# Patient Record
Sex: Female | Born: 1935 | Race: White | Hispanic: No | Smoking: Never smoker
Health system: Southern US, Community
[De-identification: ages and names within clinical notes are randomized; demographics above are authoritative.]

## PROBLEM LIST (undated history)

## (undated) DIAGNOSIS — E119 Type 2 diabetes mellitus without complications: Secondary | ICD-10-CM

## (undated) DIAGNOSIS — I10 Essential (primary) hypertension: Secondary | ICD-10-CM

## (undated) DIAGNOSIS — C50919 Malignant neoplasm of unspecified site of unspecified female breast: Secondary | ICD-10-CM

## (undated) DIAGNOSIS — D649 Anemia, unspecified: Secondary | ICD-10-CM

## (undated) DIAGNOSIS — J449 Chronic obstructive pulmonary disease, unspecified: Secondary | ICD-10-CM

## (undated) DIAGNOSIS — G912 (Idiopathic) normal pressure hydrocephalus: Secondary | ICD-10-CM

## (undated) DIAGNOSIS — M810 Age-related osteoporosis without current pathological fracture: Secondary | ICD-10-CM

## (undated) HISTORY — PX: CHOLECYSTECTOMY: SHX55

## (undated) HISTORY — PX: TUBAL LIGATION: SHX77

---

## 1992-04-14 DIAGNOSIS — C50919 Malignant neoplasm of unspecified site of unspecified female breast: Secondary | ICD-10-CM

## 1992-04-14 HISTORY — DX: Malignant neoplasm of unspecified site of unspecified female breast: C50.919

## 1992-04-14 HISTORY — PX: MASTECTOMY: SHX3

## 2011-04-17 DIAGNOSIS — Z853 Personal history of malignant neoplasm of breast: Secondary | ICD-10-CM | POA: Diagnosis not present

## 2011-04-17 DIAGNOSIS — C50919 Malignant neoplasm of unspecified site of unspecified female breast: Secondary | ICD-10-CM | POA: Diagnosis not present

## 2011-04-22 DIAGNOSIS — Z170 Estrogen receptor positive status [ER+]: Secondary | ICD-10-CM | POA: Diagnosis not present

## 2011-04-22 DIAGNOSIS — C50919 Malignant neoplasm of unspecified site of unspecified female breast: Secondary | ICD-10-CM | POA: Diagnosis not present

## 2011-06-09 DIAGNOSIS — R0989 Other specified symptoms and signs involving the circulatory and respiratory systems: Secondary | ICD-10-CM | POA: Diagnosis not present

## 2011-06-09 DIAGNOSIS — R0609 Other forms of dyspnea: Secondary | ICD-10-CM | POA: Diagnosis not present

## 2011-06-09 DIAGNOSIS — J4489 Other specified chronic obstructive pulmonary disease: Secondary | ICD-10-CM | POA: Diagnosis not present

## 2011-06-09 DIAGNOSIS — J209 Acute bronchitis, unspecified: Secondary | ICD-10-CM | POA: Diagnosis not present

## 2011-06-09 DIAGNOSIS — J449 Chronic obstructive pulmonary disease, unspecified: Secondary | ICD-10-CM | POA: Diagnosis not present

## 2011-06-11 DIAGNOSIS — J209 Acute bronchitis, unspecified: Secondary | ICD-10-CM | POA: Diagnosis not present

## 2011-06-11 DIAGNOSIS — J449 Chronic obstructive pulmonary disease, unspecified: Secondary | ICD-10-CM | POA: Diagnosis not present

## 2011-06-11 DIAGNOSIS — E119 Type 2 diabetes mellitus without complications: Secondary | ICD-10-CM | POA: Diagnosis not present

## 2011-06-19 DIAGNOSIS — R0609 Other forms of dyspnea: Secondary | ICD-10-CM | POA: Diagnosis not present

## 2011-06-19 DIAGNOSIS — J209 Acute bronchitis, unspecified: Secondary | ICD-10-CM | POA: Diagnosis not present

## 2011-06-19 DIAGNOSIS — E119 Type 2 diabetes mellitus without complications: Secondary | ICD-10-CM | POA: Diagnosis not present

## 2011-06-19 DIAGNOSIS — J449 Chronic obstructive pulmonary disease, unspecified: Secondary | ICD-10-CM | POA: Diagnosis not present

## 2011-06-23 DIAGNOSIS — M1990 Unspecified osteoarthritis, unspecified site: Secondary | ICD-10-CM | POA: Diagnosis not present

## 2011-06-23 DIAGNOSIS — M25559 Pain in unspecified hip: Secondary | ICD-10-CM | POA: Diagnosis not present

## 2011-06-23 DIAGNOSIS — J209 Acute bronchitis, unspecified: Secondary | ICD-10-CM | POA: Diagnosis not present

## 2011-08-12 DIAGNOSIS — J209 Acute bronchitis, unspecified: Secondary | ICD-10-CM | POA: Diagnosis not present

## 2011-08-12 DIAGNOSIS — Z853 Personal history of malignant neoplasm of breast: Secondary | ICD-10-CM | POA: Diagnosis not present

## 2011-08-12 DIAGNOSIS — J449 Chronic obstructive pulmonary disease, unspecified: Secondary | ICD-10-CM | POA: Diagnosis not present

## 2011-08-12 DIAGNOSIS — I8310 Varicose veins of unspecified lower extremity with inflammation: Secondary | ICD-10-CM | POA: Diagnosis not present

## 2011-08-27 DIAGNOSIS — D485 Neoplasm of uncertain behavior of skin: Secondary | ICD-10-CM | POA: Diagnosis not present

## 2011-08-27 DIAGNOSIS — L820 Inflamed seborrheic keratosis: Secondary | ICD-10-CM | POA: Diagnosis not present

## 2011-08-27 DIAGNOSIS — L570 Actinic keratosis: Secondary | ICD-10-CM | POA: Diagnosis not present

## 2011-08-27 DIAGNOSIS — Q828 Other specified congenital malformations of skin: Secondary | ICD-10-CM | POA: Diagnosis not present

## 2011-10-14 DIAGNOSIS — Z853 Personal history of malignant neoplasm of breast: Secondary | ICD-10-CM | POA: Diagnosis not present

## 2011-10-14 DIAGNOSIS — M810 Age-related osteoporosis without current pathological fracture: Secondary | ICD-10-CM | POA: Diagnosis not present

## 2011-10-14 DIAGNOSIS — I10 Essential (primary) hypertension: Secondary | ICD-10-CM | POA: Diagnosis not present

## 2011-10-14 DIAGNOSIS — J449 Chronic obstructive pulmonary disease, unspecified: Secondary | ICD-10-CM | POA: Diagnosis not present

## 2011-10-23 DIAGNOSIS — Z853 Personal history of malignant neoplasm of breast: Secondary | ICD-10-CM | POA: Diagnosis not present

## 2011-11-26 DIAGNOSIS — Z853 Personal history of malignant neoplasm of breast: Secondary | ICD-10-CM | POA: Diagnosis not present

## 2011-11-26 DIAGNOSIS — J449 Chronic obstructive pulmonary disease, unspecified: Secondary | ICD-10-CM | POA: Diagnosis not present

## 2011-12-03 DIAGNOSIS — Z853 Personal history of malignant neoplasm of breast: Secondary | ICD-10-CM | POA: Diagnosis not present

## 2011-12-03 DIAGNOSIS — N63 Unspecified lump in unspecified breast: Secondary | ICD-10-CM | POA: Diagnosis not present

## 2011-12-29 DIAGNOSIS — Z170 Estrogen receptor positive status [ER+]: Secondary | ICD-10-CM | POA: Diagnosis not present

## 2011-12-29 DIAGNOSIS — C50919 Malignant neoplasm of unspecified site of unspecified female breast: Secondary | ICD-10-CM | POA: Diagnosis not present

## 2012-01-05 DIAGNOSIS — Z170 Estrogen receptor positive status [ER+]: Secondary | ICD-10-CM | POA: Diagnosis not present

## 2012-01-05 DIAGNOSIS — C50119 Malignant neoplasm of central portion of unspecified female breast: Secondary | ICD-10-CM | POA: Diagnosis not present

## 2012-01-27 DIAGNOSIS — J45909 Unspecified asthma, uncomplicated: Secondary | ICD-10-CM | POA: Diagnosis not present

## 2012-01-27 DIAGNOSIS — J328 Other chronic sinusitis: Secondary | ICD-10-CM | POA: Diagnosis not present

## 2012-01-27 DIAGNOSIS — J984 Other disorders of lung: Secondary | ICD-10-CM | POA: Diagnosis not present

## 2012-01-27 DIAGNOSIS — R05 Cough: Secondary | ICD-10-CM | POA: Diagnosis not present

## 2012-01-27 DIAGNOSIS — R0602 Shortness of breath: Secondary | ICD-10-CM | POA: Diagnosis not present

## 2012-02-03 DIAGNOSIS — Z23 Encounter for immunization: Secondary | ICD-10-CM | POA: Diagnosis not present

## 2012-02-17 DIAGNOSIS — J449 Chronic obstructive pulmonary disease, unspecified: Secondary | ICD-10-CM | POA: Diagnosis not present

## 2012-02-17 DIAGNOSIS — M159 Polyosteoarthritis, unspecified: Secondary | ICD-10-CM | POA: Diagnosis not present

## 2012-02-17 DIAGNOSIS — E119 Type 2 diabetes mellitus without complications: Secondary | ICD-10-CM | POA: Diagnosis not present

## 2012-02-17 DIAGNOSIS — Z0000 Encounter for general adult medical examination without abnormal findings: Secondary | ICD-10-CM | POA: Diagnosis not present

## 2012-02-17 DIAGNOSIS — Z853 Personal history of malignant neoplasm of breast: Secondary | ICD-10-CM | POA: Diagnosis not present

## 2012-02-17 DIAGNOSIS — I10 Essential (primary) hypertension: Secondary | ICD-10-CM | POA: Diagnosis not present

## 2012-02-23 DIAGNOSIS — Z853 Personal history of malignant neoplasm of breast: Secondary | ICD-10-CM | POA: Diagnosis not present

## 2012-02-23 DIAGNOSIS — M810 Age-related osteoporosis without current pathological fracture: Secondary | ICD-10-CM | POA: Diagnosis not present

## 2012-02-23 DIAGNOSIS — J449 Chronic obstructive pulmonary disease, unspecified: Secondary | ICD-10-CM | POA: Diagnosis not present

## 2012-02-23 DIAGNOSIS — E119 Type 2 diabetes mellitus without complications: Secondary | ICD-10-CM | POA: Diagnosis not present

## 2012-02-25 DIAGNOSIS — E119 Type 2 diabetes mellitus without complications: Secondary | ICD-10-CM | POA: Diagnosis not present

## 2012-02-25 DIAGNOSIS — Z961 Presence of intraocular lens: Secondary | ICD-10-CM | POA: Diagnosis not present

## 2012-05-31 DIAGNOSIS — J328 Other chronic sinusitis: Secondary | ICD-10-CM | POA: Diagnosis not present

## 2012-05-31 DIAGNOSIS — J45909 Unspecified asthma, uncomplicated: Secondary | ICD-10-CM | POA: Diagnosis not present

## 2012-05-31 DIAGNOSIS — R05 Cough: Secondary | ICD-10-CM | POA: Diagnosis not present

## 2012-06-23 DIAGNOSIS — Z853 Personal history of malignant neoplasm of breast: Secondary | ICD-10-CM | POA: Diagnosis not present

## 2012-06-23 DIAGNOSIS — C50919 Malignant neoplasm of unspecified site of unspecified female breast: Secondary | ICD-10-CM | POA: Diagnosis not present

## 2012-06-23 DIAGNOSIS — R51 Headache: Secondary | ICD-10-CM | POA: Diagnosis not present

## 2012-06-23 DIAGNOSIS — J449 Chronic obstructive pulmonary disease, unspecified: Secondary | ICD-10-CM | POA: Diagnosis not present

## 2012-06-23 DIAGNOSIS — E119 Type 2 diabetes mellitus without complications: Secondary | ICD-10-CM | POA: Diagnosis not present

## 2012-06-29 DIAGNOSIS — R51 Headache: Secondary | ICD-10-CM | POA: Diagnosis not present

## 2012-07-01 DIAGNOSIS — Z853 Personal history of malignant neoplasm of breast: Secondary | ICD-10-CM | POA: Diagnosis not present

## 2012-07-01 DIAGNOSIS — Z85038 Personal history of other malignant neoplasm of large intestine: Secondary | ICD-10-CM | POA: Diagnosis not present

## 2012-07-01 DIAGNOSIS — G119 Hereditary ataxia, unspecified: Secondary | ICD-10-CM | POA: Diagnosis not present

## 2012-07-01 DIAGNOSIS — R9389 Abnormal findings on diagnostic imaging of other specified body structures: Secondary | ICD-10-CM | POA: Diagnosis not present

## 2012-07-06 DIAGNOSIS — D496 Neoplasm of unspecified behavior of brain: Secondary | ICD-10-CM | POA: Diagnosis not present

## 2012-07-08 DIAGNOSIS — C50119 Malignant neoplasm of central portion of unspecified female breast: Secondary | ICD-10-CM | POA: Diagnosis not present

## 2012-07-08 DIAGNOSIS — Z170 Estrogen receptor positive status [ER+]: Secondary | ICD-10-CM | POA: Diagnosis not present

## 2012-07-14 DIAGNOSIS — G9389 Other specified disorders of brain: Secondary | ICD-10-CM | POA: Diagnosis not present

## 2012-07-14 DIAGNOSIS — K5730 Diverticulosis of large intestine without perforation or abscess without bleeding: Secondary | ICD-10-CM | POA: Diagnosis not present

## 2012-07-14 DIAGNOSIS — C50919 Malignant neoplasm of unspecified site of unspecified female breast: Secondary | ICD-10-CM | POA: Diagnosis not present

## 2012-07-14 DIAGNOSIS — M542 Cervicalgia: Secondary | ICD-10-CM | POA: Diagnosis not present

## 2012-07-20 DIAGNOSIS — C50119 Malignant neoplasm of central portion of unspecified female breast: Secondary | ICD-10-CM | POA: Diagnosis not present

## 2012-08-06 DIAGNOSIS — M47812 Spondylosis without myelopathy or radiculopathy, cervical region: Secondary | ICD-10-CM | POA: Diagnosis not present

## 2012-08-06 DIAGNOSIS — D496 Neoplasm of unspecified behavior of brain: Secondary | ICD-10-CM | POA: Diagnosis not present

## 2012-08-06 DIAGNOSIS — M542 Cervicalgia: Secondary | ICD-10-CM | POA: Diagnosis not present

## 2012-08-06 DIAGNOSIS — M5030 Other cervical disc degeneration, unspecified cervical region: Secondary | ICD-10-CM | POA: Diagnosis not present

## 2012-08-06 DIAGNOSIS — R51 Headache: Secondary | ICD-10-CM | POA: Diagnosis not present

## 2012-08-10 DIAGNOSIS — D496 Neoplasm of unspecified behavior of brain: Secondary | ICD-10-CM | POA: Diagnosis not present

## 2012-08-27 DIAGNOSIS — E538 Deficiency of other specified B group vitamins: Secondary | ICD-10-CM | POA: Diagnosis not present

## 2012-08-27 DIAGNOSIS — R42 Dizziness and giddiness: Secondary | ICD-10-CM | POA: Diagnosis not present

## 2012-10-01 DIAGNOSIS — F0280 Dementia in other diseases classified elsewhere without behavioral disturbance: Secondary | ICD-10-CM | POA: Diagnosis not present

## 2012-10-01 DIAGNOSIS — R51 Headache: Secondary | ICD-10-CM | POA: Diagnosis not present

## 2012-10-01 DIAGNOSIS — G309 Alzheimer's disease, unspecified: Secondary | ICD-10-CM | POA: Diagnosis not present

## 2012-10-08 DIAGNOSIS — IMO0001 Reserved for inherently not codable concepts without codable children: Secondary | ICD-10-CM | POA: Diagnosis not present

## 2012-10-08 DIAGNOSIS — R262 Difficulty in walking, not elsewhere classified: Secondary | ICD-10-CM | POA: Diagnosis not present

## 2012-10-11 DIAGNOSIS — IMO0001 Reserved for inherently not codable concepts without codable children: Secondary | ICD-10-CM | POA: Diagnosis not present

## 2012-10-11 DIAGNOSIS — R262 Difficulty in walking, not elsewhere classified: Secondary | ICD-10-CM | POA: Diagnosis not present

## 2012-10-13 DIAGNOSIS — R262 Difficulty in walking, not elsewhere classified: Secondary | ICD-10-CM | POA: Diagnosis not present

## 2012-10-13 DIAGNOSIS — IMO0001 Reserved for inherently not codable concepts without codable children: Secondary | ICD-10-CM | POA: Diagnosis not present

## 2012-10-18 DIAGNOSIS — R262 Difficulty in walking, not elsewhere classified: Secondary | ICD-10-CM | POA: Diagnosis not present

## 2012-10-18 DIAGNOSIS — IMO0001 Reserved for inherently not codable concepts without codable children: Secondary | ICD-10-CM | POA: Diagnosis not present

## 2012-10-20 DIAGNOSIS — R262 Difficulty in walking, not elsewhere classified: Secondary | ICD-10-CM | POA: Diagnosis not present

## 2012-10-20 DIAGNOSIS — IMO0001 Reserved for inherently not codable concepts without codable children: Secondary | ICD-10-CM | POA: Diagnosis not present

## 2012-10-22 ENCOUNTER — Ambulatory Visit (HOSPITAL_COMMUNITY)
Admission: RE | Admit: 2012-10-22 | Discharge: 2012-10-22 | Disposition: A | Discharge status: Home / Self Care | Payer: Medicare Other | Source: Ambulatory Visit | Attending: Neurology | Admitting: Neurology

## 2012-10-22 DIAGNOSIS — R413 Other amnesia: Secondary | ICD-10-CM | POA: Insufficient documentation

## 2012-10-22 DIAGNOSIS — R404 Transient alteration of awareness: Secondary | ICD-10-CM | POA: Diagnosis not present

## 2012-10-22 NOTE — Procedures (Signed)
History: 79 yo F with memory loss  Sedation: None  Background: There is a well defined posterior dominant rhythm of 9 Hz that attenuates with eye opening. The background consists of intermixed alpha and beta activities. There is an increase in delta activity associated with drowsiness. There are normal sleep structures recorded.  Photic stimulation: Physiologic driving is present  EEG Abnormalities: None  Clinical Interpretation: This normal EEG is recorded in the waking and sleep state. There was no seizure or seizure predisposition recorded on this study.   Ritta Slot, MD [facility] [phone]  If 7pm- 7am, please page neurology on call at [phone].

## 2012-10-22 NOTE — Progress Notes (Signed)
EEG Completed; Results Pending  

## 2012-12-03 DIAGNOSIS — R51 Headache: Secondary | ICD-10-CM | POA: Diagnosis not present

## 2012-12-03 DIAGNOSIS — F0280 Dementia in other diseases classified elsewhere without behavioral disturbance: Secondary | ICD-10-CM | POA: Diagnosis not present

## 2012-12-27 DIAGNOSIS — C50119 Malignant neoplasm of central portion of unspecified female breast: Secondary | ICD-10-CM | POA: Diagnosis not present

## 2012-12-27 DIAGNOSIS — C50919 Malignant neoplasm of unspecified site of unspecified female breast: Secondary | ICD-10-CM | POA: Diagnosis not present

## 2012-12-29 DIAGNOSIS — Z853 Personal history of malignant neoplasm of breast: Secondary | ICD-10-CM | POA: Diagnosis not present

## 2012-12-29 DIAGNOSIS — Z1231 Encounter for screening mammogram for malignant neoplasm of breast: Secondary | ICD-10-CM | POA: Diagnosis not present

## 2012-12-30 DIAGNOSIS — E119 Type 2 diabetes mellitus without complications: Secondary | ICD-10-CM | POA: Diagnosis not present

## 2012-12-30 DIAGNOSIS — Z853 Personal history of malignant neoplasm of breast: Secondary | ICD-10-CM | POA: Diagnosis not present

## 2012-12-30 DIAGNOSIS — J449 Chronic obstructive pulmonary disease, unspecified: Secondary | ICD-10-CM | POA: Diagnosis not present

## 2012-12-30 DIAGNOSIS — J4489 Other specified chronic obstructive pulmonary disease: Secondary | ICD-10-CM | POA: Diagnosis not present

## 2012-12-30 DIAGNOSIS — I10 Essential (primary) hypertension: Secondary | ICD-10-CM | POA: Diagnosis not present

## 2012-12-30 DIAGNOSIS — Z0000 Encounter for general adult medical examination without abnormal findings: Secondary | ICD-10-CM | POA: Diagnosis not present

## 2013-02-16 DIAGNOSIS — E119 Type 2 diabetes mellitus without complications: Secondary | ICD-10-CM | POA: Diagnosis not present

## 2013-02-16 DIAGNOSIS — Z961 Presence of intraocular lens: Secondary | ICD-10-CM | POA: Diagnosis not present

## 2013-07-08 DIAGNOSIS — C50119 Malignant neoplasm of central portion of unspecified female breast: Secondary | ICD-10-CM | POA: Diagnosis not present

## 2013-07-08 DIAGNOSIS — Z170 Estrogen receptor positive status [ER+]: Secondary | ICD-10-CM | POA: Diagnosis not present

## 2013-07-15 DIAGNOSIS — R51 Headache: Secondary | ICD-10-CM | POA: Diagnosis not present

## 2013-07-15 DIAGNOSIS — R9409 Abnormal results of other function studies of central nervous system: Secondary | ICD-10-CM | POA: Diagnosis not present

## 2013-07-15 DIAGNOSIS — G309 Alzheimer's disease, unspecified: Secondary | ICD-10-CM | POA: Diagnosis not present

## 2013-07-15 DIAGNOSIS — F0280 Dementia in other diseases classified elsewhere without behavioral disturbance: Secondary | ICD-10-CM | POA: Diagnosis not present

## 2013-09-02 DIAGNOSIS — B351 Tinea unguium: Secondary | ICD-10-CM | POA: Diagnosis not present

## 2013-09-02 DIAGNOSIS — L600 Ingrowing nail: Secondary | ICD-10-CM | POA: Diagnosis not present

## 2013-09-02 DIAGNOSIS — M79609 Pain in unspecified limb: Secondary | ICD-10-CM | POA: Diagnosis not present

## 2013-09-26 DIAGNOSIS — L03039 Cellulitis of unspecified toe: Secondary | ICD-10-CM | POA: Diagnosis not present

## 2013-09-26 DIAGNOSIS — E785 Hyperlipidemia, unspecified: Secondary | ICD-10-CM | POA: Diagnosis not present

## 2013-09-26 DIAGNOSIS — Z8673 Personal history of transient ischemic attack (TIA), and cerebral infarction without residual deficits: Secondary | ICD-10-CM | POA: Diagnosis not present

## 2013-09-26 DIAGNOSIS — I10 Essential (primary) hypertension: Secondary | ICD-10-CM | POA: Diagnosis not present

## 2013-09-26 DIAGNOSIS — M129 Arthropathy, unspecified: Secondary | ICD-10-CM | POA: Diagnosis not present

## 2013-09-26 DIAGNOSIS — L02619 Cutaneous abscess of unspecified foot: Secondary | ICD-10-CM | POA: Diagnosis not present

## 2013-09-26 DIAGNOSIS — Z853 Personal history of malignant neoplasm of breast: Secondary | ICD-10-CM | POA: Diagnosis not present

## 2013-09-26 DIAGNOSIS — E119 Type 2 diabetes mellitus without complications: Secondary | ICD-10-CM | POA: Diagnosis not present

## 2013-09-26 DIAGNOSIS — J449 Chronic obstructive pulmonary disease, unspecified: Secondary | ICD-10-CM | POA: Diagnosis not present

## 2013-09-26 DIAGNOSIS — L089 Local infection of the skin and subcutaneous tissue, unspecified: Secondary | ICD-10-CM | POA: Diagnosis not present

## 2013-09-27 DIAGNOSIS — M129 Arthropathy, unspecified: Secondary | ICD-10-CM | POA: Diagnosis not present

## 2013-09-27 DIAGNOSIS — L089 Local infection of the skin and subcutaneous tissue, unspecified: Secondary | ICD-10-CM | POA: Diagnosis not present

## 2013-10-14 DIAGNOSIS — T8189XA Other complications of procedures, not elsewhere classified, initial encounter: Secondary | ICD-10-CM | POA: Diagnosis not present

## 2013-10-17 DIAGNOSIS — F0280 Dementia in other diseases classified elsewhere without behavioral disturbance: Secondary | ICD-10-CM | POA: Diagnosis not present

## 2013-10-17 DIAGNOSIS — R51 Headache: Secondary | ICD-10-CM | POA: Diagnosis not present

## 2013-10-17 DIAGNOSIS — R9409 Abnormal results of other function studies of central nervous system: Secondary | ICD-10-CM | POA: Diagnosis not present

## 2013-10-17 DIAGNOSIS — G309 Alzheimer's disease, unspecified: Secondary | ICD-10-CM | POA: Diagnosis not present

## 2013-11-11 DIAGNOSIS — B351 Tinea unguium: Secondary | ICD-10-CM | POA: Diagnosis not present

## 2013-11-11 DIAGNOSIS — M79609 Pain in unspecified limb: Secondary | ICD-10-CM | POA: Diagnosis not present

## 2013-11-25 DIAGNOSIS — E119 Type 2 diabetes mellitus without complications: Secondary | ICD-10-CM | POA: Diagnosis not present

## 2013-11-25 DIAGNOSIS — J449 Chronic obstructive pulmonary disease, unspecified: Secondary | ICD-10-CM | POA: Diagnosis not present

## 2013-11-25 DIAGNOSIS — C50919 Malignant neoplasm of unspecified site of unspecified female breast: Secondary | ICD-10-CM | POA: Diagnosis not present

## 2013-11-25 DIAGNOSIS — Z79899 Other long term (current) drug therapy: Secondary | ICD-10-CM | POA: Diagnosis not present

## 2014-01-17 DIAGNOSIS — Z23 Encounter for immunization: Secondary | ICD-10-CM | POA: Diagnosis not present

## 2014-01-17 DIAGNOSIS — E119 Type 2 diabetes mellitus without complications: Secondary | ICD-10-CM | POA: Diagnosis not present

## 2014-01-17 DIAGNOSIS — M810 Age-related osteoporosis without current pathological fracture: Secondary | ICD-10-CM | POA: Diagnosis not present

## 2014-01-17 DIAGNOSIS — F0390 Unspecified dementia without behavioral disturbance: Secondary | ICD-10-CM | POA: Diagnosis not present

## 2014-01-17 DIAGNOSIS — I10 Essential (primary) hypertension: Secondary | ICD-10-CM | POA: Diagnosis not present

## 2014-01-17 DIAGNOSIS — K219 Gastro-esophageal reflux disease without esophagitis: Secondary | ICD-10-CM | POA: Diagnosis not present

## 2014-01-17 DIAGNOSIS — I679 Cerebrovascular disease, unspecified: Secondary | ICD-10-CM | POA: Diagnosis not present

## 2014-01-17 DIAGNOSIS — J449 Chronic obstructive pulmonary disease, unspecified: Secondary | ICD-10-CM | POA: Diagnosis not present

## 2014-04-20 DIAGNOSIS — J40 Bronchitis, not specified as acute or chronic: Secondary | ICD-10-CM | POA: Diagnosis not present

## 2014-04-20 DIAGNOSIS — J449 Chronic obstructive pulmonary disease, unspecified: Secondary | ICD-10-CM | POA: Diagnosis not present

## 2014-04-20 DIAGNOSIS — J309 Allergic rhinitis, unspecified: Secondary | ICD-10-CM | POA: Diagnosis not present

## 2014-04-20 DIAGNOSIS — E119 Type 2 diabetes mellitus without complications: Secondary | ICD-10-CM | POA: Diagnosis not present

## 2014-07-20 DIAGNOSIS — E780 Pure hypercholesterolemia: Secondary | ICD-10-CM | POA: Diagnosis not present

## 2014-07-20 DIAGNOSIS — M810 Age-related osteoporosis without current pathological fracture: Secondary | ICD-10-CM | POA: Diagnosis not present

## 2014-07-20 DIAGNOSIS — R079 Chest pain, unspecified: Secondary | ICD-10-CM | POA: Diagnosis not present

## 2014-07-20 DIAGNOSIS — E1142 Type 2 diabetes mellitus with diabetic polyneuropathy: Secondary | ICD-10-CM | POA: Diagnosis not present

## 2014-07-20 DIAGNOSIS — I10 Essential (primary) hypertension: Secondary | ICD-10-CM | POA: Diagnosis not present

## 2015-02-06 DIAGNOSIS — E7800 Pure hypercholesterolemia, unspecified: Secondary | ICD-10-CM | POA: Diagnosis not present

## 2015-02-06 DIAGNOSIS — Z0000 Encounter for general adult medical examination without abnormal findings: Secondary | ICD-10-CM | POA: Diagnosis not present

## 2015-02-06 DIAGNOSIS — I10 Essential (primary) hypertension: Secondary | ICD-10-CM | POA: Diagnosis not present

## 2015-02-06 DIAGNOSIS — E1142 Type 2 diabetes mellitus with diabetic polyneuropathy: Secondary | ICD-10-CM | POA: Diagnosis not present

## 2015-02-06 DIAGNOSIS — J449 Chronic obstructive pulmonary disease, unspecified: Secondary | ICD-10-CM | POA: Diagnosis not present

## 2015-02-06 DIAGNOSIS — Z23 Encounter for immunization: Secondary | ICD-10-CM | POA: Diagnosis not present

## 2015-02-06 DIAGNOSIS — M810 Age-related osteoporosis without current pathological fracture: Secondary | ICD-10-CM | POA: Diagnosis not present

## 2015-02-06 DIAGNOSIS — F0390 Unspecified dementia without behavioral disturbance: Secondary | ICD-10-CM | POA: Diagnosis not present

## 2015-02-06 DIAGNOSIS — J309 Allergic rhinitis, unspecified: Secondary | ICD-10-CM | POA: Diagnosis not present

## 2015-02-06 DIAGNOSIS — K219 Gastro-esophageal reflux disease without esophagitis: Secondary | ICD-10-CM | POA: Diagnosis not present

## 2015-02-09 DIAGNOSIS — H04123 Dry eye syndrome of bilateral lacrimal glands: Secondary | ICD-10-CM | POA: Diagnosis not present

## 2015-02-09 DIAGNOSIS — H524 Presbyopia: Secondary | ICD-10-CM | POA: Diagnosis not present

## 2015-10-31 DIAGNOSIS — S20211A Contusion of right front wall of thorax, initial encounter: Secondary | ICD-10-CM | POA: Diagnosis not present

## 2015-10-31 DIAGNOSIS — W19XXXA Unspecified fall, initial encounter: Secondary | ICD-10-CM | POA: Diagnosis not present

## 2015-11-14 DIAGNOSIS — R296 Repeated falls: Secondary | ICD-10-CM | POA: Diagnosis not present

## 2015-11-14 DIAGNOSIS — E1142 Type 2 diabetes mellitus with diabetic polyneuropathy: Secondary | ICD-10-CM | POA: Diagnosis not present

## 2015-11-14 DIAGNOSIS — J449 Chronic obstructive pulmonary disease, unspecified: Secondary | ICD-10-CM | POA: Diagnosis not present

## 2015-11-14 DIAGNOSIS — Z7984 Long term (current) use of oral hypoglycemic drugs: Secondary | ICD-10-CM | POA: Diagnosis not present

## 2015-11-14 DIAGNOSIS — F0390 Unspecified dementia without behavioral disturbance: Secondary | ICD-10-CM | POA: Diagnosis not present

## 2015-11-28 DIAGNOSIS — R51 Headache: Secondary | ICD-10-CM | POA: Diagnosis not present

## 2015-11-28 DIAGNOSIS — G309 Alzheimer's disease, unspecified: Secondary | ICD-10-CM | POA: Diagnosis not present

## 2015-11-28 DIAGNOSIS — R9402 Abnormal brain scan: Secondary | ICD-10-CM | POA: Diagnosis not present

## 2015-12-26 ENCOUNTER — Ambulatory Visit: Payer: Medicare Other | Attending: Family Medicine | Admitting: Physical Therapy

## 2015-12-26 DIAGNOSIS — R2681 Unsteadiness on feet: Secondary | ICD-10-CM | POA: Diagnosis not present

## 2015-12-26 DIAGNOSIS — M6281 Muscle weakness (generalized): Secondary | ICD-10-CM | POA: Insufficient documentation

## 2015-12-26 DIAGNOSIS — R42 Dizziness and giddiness: Secondary | ICD-10-CM | POA: Insufficient documentation

## 2015-12-26 DIAGNOSIS — R2689 Other abnormalities of gait and mobility: Secondary | ICD-10-CM | POA: Insufficient documentation

## 2015-12-27 ENCOUNTER — Encounter: Payer: Self-pay | Admitting: Physical Therapy

## 2015-12-27 NOTE — Therapy (Signed)
[facility] [address] [place], [place], [address] Phone: [phone]   Fax:  [phone]  Physical Therapy Evaluation  Patient Details  Name: Candice Miller MRN: [id number] Date of Birth: 05-25-1924 Referring Provider: Melinda Crutch, MD  Encounter Date: 12/26/2015      PT End of Session - 12/26/15 2156    Visit Number 1   Number of Visits 17   Date for PT Re-Evaluation 02/22/16   Authorization Type Medicare G-code & progress note   PT Start Time 1533   PT Stop Time 1622   PT Time Calculation (min) 49 min   Equipment Utilized During Treatment Gait belt   Activity Tolerance Patient tolerated treatment well;Patient limited by fatigue   Behavior During Therapy [facility] for tasks assessed/performed      History reviewed. No pertinent past medical history.  History reviewed. No pertinent surgical history.  There were no vitals filed for this visit.       Subjective Assessment - 12/26/15 1542    Subjective Pt reports mini strokes beginning "4-5 years ago" with multiple falls within the last four years. She has had increasing unsteadiness and instability on her feet recently.  Daughter reports pt uses a RW "all the time now" including at home and in the community.  Referral was received for PT to eval and treat for frequent falls.   Patient is accompained by: Family member  daughter   Pertinent History dementia, hard of hearing, COPD, HTN, OA.osteoporosis   Limitations Walking;House hold activities;Standing   Patient Stated Goals "I want to get to where I can walk."   Currently in Pain? No/denies   Multiple Pain Sites No            [facility] PT Assessment - 12/27/15 0001      Posture/Postural Control   Posture/Postural Control --            Vestibular Assessment - 12/26/15 1530      Orthostatics   BP supine (x 5 minutes) 147/71   HR supine (x 5 minutes) 64  SpO2 96%   BP sitting 159/72  light headed   HR  sitting 67   BP standing (after 1 minute) 145/57  light headed   HR standing (after 1 minute) 71   BP standing (after 3 minutes) 145/82   HR standing (after 3 minutes) 76  SpO2 97%        12/26/15 1530  Assessment  Medical Diagnosis Falls, Mini strokes  Referring Provider Melinda Crutch, MD  Hand Dominance Right  Precautions  Precautions Fall  Restrictions  Weight Bearing Restrictions No  Balance Screen  Has the patient fallen in the past 6 months Yes  How many times? 1  Has the patient had a decrease in activity level because of a fear of falling?  Yes  Is the patient reluctant to leave their home because of a fear of falling?  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  Postural Limitations Rounded Shoulders;Forward head;Increased thoracic kyphosis;Flexed trunk  ROM / Strength  AROM / PROM / Strength AROM;Strength  AROM  Overall AROM  Within functional limits for tasks performed  Strength  Overall Strength Within functional limits for tasks performed  Strength Assessment Site Shoulder;Knee;Ankle;Hip  Right/Left Shoulder Right;Left  Right/Left Hip Right;Left  Right/Left Knee Right;Left  Right/Left Ankle Right;Left  Right Shoulder Flexion 4-/5  Right Shoulder ABduction 4-/5  Left Shoulder Flexion 3+/5  Left Shoulder ABduction 3+/5  Right Hip Flexion 3/5  Right Hip Extension 3/5 (tested in altered position; standing with B UE support )  Right Hip ABduction 3-/5 (tested in altered position; standing with B UE  support)  Left Hip Flexion 3/5  Left Hip Extension 3-/5 (tested in altered position; standing with B UE support)  Left Hip ABduction 3-/5 (tested in altered position; standing with B UE support)  Right Knee Flexion 3/5  Right Knee Extension 4-/5  Left Knee Flexion 3/5  Left Knee Extension 3+/5  Right Ankle Dorsiflexion 4/5  Left Ankle Dorsiflexion 4-/5  Transfers  Transfers Sit to Stand;Stand to Sit  Sit to Stand 5: Supervision;With upper extremity assist;With armrests;From chair/3-in-1 (slow, uses back of legs against chair or mat table, )  Stand to Sit 5: Supervision;With upper extremity assist;To chair/3-in-1;With armrests (from rollator)  Ambulation/Gait  Ambulation/Gait Yes  Ambulation Distance (Feet) 75 Feet  Assistive device Rollator  Gait Pattern Step-through pattern;Decreased stride length;Decreased hip/knee flexion - right;Decreased hip/knee flexion - left;Trunk flexed;Wide base of support  Ambulation Surface Indoor;Level  Gait velocity 1.21 ft/sec (indicates household ambulator &<1.8 indicates fall risk)  Standardized Balance Assessment  Standardized Balance Assessment Berg Balance Test;TUG  Berg Balance Test  Sit to Stand 1  Standing Unsupported 3  Sitting with Back Unsupported but Feet Supported on Floor or Stool 4  Stand to Sit 2  Transfers 3  Standing Unsupported with Eyes Closed 3  Standing Ubsupported with Feet Together 1  From Standing, Reach Forward with Outstretched Arm 1  From Standing Position, Pick up Object from Floor 0  From Standing Position, Turn to Look Behind Over each Shoulder 1  Turn 360 Degrees 0  Standing Unsupported, Alternately Place Feet on Step/Stool 0  Standing Unsupported, One Foot in Front 0  Standing on One Leg 0  Total Score 19  Timed Up and Go Test  TUG Normal TUG  Normal TUG (seconds) 34.37 (indicates fall risk)                      PT Short Term Goals - 12/26/15 2215      PT SHORT TERM GOAL #1   Title  Patient demonstrates understanding of [place] with daughter cueing. (Target Date: 01/25/2016)   Time 4   Period Weeks   Status New     PT SHORT TERM GOAL #2   Title Timed Up-Go with rollator walker <31sec with supervision. (Target Date: 01/25/2016)   Time 4   Period Weeks   Status New     PT SHORT TERM GOAL #3   Title Patient and dtr verbalize understanding of Orthostatic Hypotension recomendations. (Target Date: 01/25/2016)   Time 4   Period Weeks   Status New     PT SHORT TERM GOAL #4   Title Patient ambulates 150' with rollator walker with supervision /verbal cues. (Target Date: 01/25/2016)   Time 4   Period Weeks   Status New           PT Long Term Goals - 12/26/15 2209      PT LONG TERM GOAL #1   Title Patient  with daughters cueing is able to demonstrate understanding of ongoing HEP / fitness plan. (Target Date: 02/22/2016)   Time 8   Period Weeks   Status New     PT LONG TERM GOAL #2   Title Berg Balance >/= 27/56 to indicate lower fall risk. (Target Date: 02/22/2016)   Time 8   Period Weeks   Status New     PT LONG TERM GOAL #3   Title Timed Up-Go with rollator walker <28sec to indicate lower fall risk. (Target Date: 02/22/2016)   Time 8   Period Weeks   Status New     PT LONG TERM GOAL #4   Title Patient and daughter verbalize understanding of fall prevention strategies including orthostatic hypotension recommendations. (Target Date: 02/22/2016)   Time 8   Period Weeks   Status New     PT LONG TERM GOAL #5   Title Patient ambulates 250' with rollator walker with supervision. (Target Date: 02/22/2016)   Time 8   Period Weeks   Status New     Additional Long Term Goals   Additional Long Term Goals Yes     PT LONG TERM GOAL #6   Title Patient negotiates ramps & curbs with rollator walker with minA from dtr safely. (Target Date: 02/22/2016)   Time 8   Period Weeks   Status New               Plan - January 10, 2016 July 28, 2156    Clinical  Impression Statement This 82yo female has declined in mobility over the last 4 years. She has increased weakness & falls with several mini strokes further limiting her mobility. Berg Balance 19/56 indicates high fall risk. Timed Up-Go 34.37sec indicates high fall risk & dependency in ADLs. She has BP drop with symptoms consistent with orthostatic hypotension further increasing fall risk. Her gait velocity of 1.21 ft/sec and deviations indicate fall risk also and dependency in ADLs. Her condition is evolving and her plan of care is moderate complexity. This patient would benefit from skilled PT to improve mobility & safety.                 Rehab Potential Good   PT Frequency 2x / week   PT Duration 8 weeks   PT Treatment/Interventions ADLs/Self Care Home Management;DME Instruction;Gait training;Stair training;Functional mobility training;Therapeutic activities;Therapeutic exercise;Balance training;Neuromuscular re-education;Patient/family education   PT Next Visit Plan [place] [facility], instruct in Orthostatic Hypotension recommendations & fall prevention strategies   Consulted and Agree with Plan of Care Patient;Family member/caregiver   Family Member Consulted dtr      Patient will benefit from skilled therapeutic intervention in order to improve the following deficits and impairments:  Abnormal gait, Decreased activity tolerance, Decreased cognition, Decreased balance, Decreased knowledge of use of DME, Decreased mobility, Decreased range of motion, Dizziness, Decreased strength, Postural dysfunction, Obesity, Pain  Visit Diagnosis: Dizziness and giddiness  Other abnormalities of gait and mobility  Muscle weakness (generalized)  Unsteadiness on feet      G-Codes - Jan 10, 2016 07-29-2218    Functional Assessment Tool Used Timed Up-Go with rollator walker 34.37sec with min guard.   Functional Limitation Mobility: Walking and moving around   Mobility: Walking and Moving Around Current Status [phone]) At  least 80 percent but less than 100 percent impaired, limited or restricted   Mobility: Walking and Moving Around Goal Status [id number]) At least 60 percent but less than 80 percent impaired, limited or restricted       Problem List  Patient Active Problem List   Diagnosis Date Noted  . Memory loss 10/22/2012    Jamey Reas PT, DPT 12/27/2015, 10:22 PM  [facility] [address] [place], [place], [address] Phone: [phone]   Fax:  [phone]  Name: Candice Miller MRN: [id number] Date of Birth: Apr 10, 1925

## 2016-01-01 DIAGNOSIS — J189 Pneumonia, unspecified organism: Secondary | ICD-10-CM | POA: Diagnosis not present

## 2016-01-01 DIAGNOSIS — E1142 Type 2 diabetes mellitus with diabetic polyneuropathy: Secondary | ICD-10-CM | POA: Diagnosis not present

## 2016-01-09 ENCOUNTER — Ambulatory Visit: Payer: Medicare Other | Admitting: Physical Therapy

## 2016-01-09 ENCOUNTER — Encounter: Payer: Self-pay | Admitting: Physical Therapy

## 2016-01-09 DIAGNOSIS — R42 Dizziness and giddiness: Secondary | ICD-10-CM | POA: Diagnosis not present

## 2016-01-09 DIAGNOSIS — R2681 Unsteadiness on feet: Secondary | ICD-10-CM | POA: Diagnosis not present

## 2016-01-09 DIAGNOSIS — M6281 Muscle weakness (generalized): Secondary | ICD-10-CM

## 2016-01-09 DIAGNOSIS — R2689 Other abnormalities of gait and mobility: Secondary | ICD-10-CM | POA: Diagnosis not present

## 2016-01-09 NOTE — Patient Instructions (Addendum)

## 2016-01-10 NOTE — Therapy (Signed)
[facility] [address] [place], [place], [address] Phone: [phone]   Fax:  [phone]  Physical Therapy Treatment  Patient Details  Name: Candice Miller MRN: [id number] Date of Birth: Mar 12, 1925 Referring Provider: Melinda Crutch, MD  Encounter Date: 01/09/2016      PT End of Session - 01/09/16 1500    Visit Number 2   Number of Visits 17   Date for PT Re-Evaluation 02/22/16   Authorization Type Medicare G-code & progress note   PT Start Time 1449   PT Stop Time 1530   PT Time Calculation (min) 41 min   Equipment Utilized During Treatment Gait belt   Activity Tolerance Patient tolerated treatment well;Patient limited by fatigue   Behavior During Therapy [facility] for tasks assessed/performed      History reviewed. No pertinent past medical history.  History reviewed. No pertinent surgical history.  There were no vitals filed for this visit.      Subjective Assessment - 01/09/16 1456    Subjective No pain currently. Pt's daughter reports that she had a fall last Monday evening. Daughter reports is was a "controlled fall" as her and her husband lowered her mom down to floor when both her legs gave way in the bathroom.  Pt had aparently been getting weaker for a few days prior to this and was only getting up with help due to this. EMS came and assisted pt up and into her bed. VVS and everything else checked out so they did not transport her to the ED. They recommneded that she see her primary MD to see what is going on. Daughter took pt into primary MD's office the next day and pt was diagnoses with PNA. She was given an antibiotic shot that day in clinic and started on oral antibiotics for home. They were also instructed to increase her nebulizer treatments to 3-4 times a day. Pt now has only 2-3 days left with antibiotic and reportes feeling better. Has a follow up with primary MD next week.                                   Patient is accompained by: Family member   Pertinent History dementia, hard of hearing, COPD, HTN, OA.osteoporosis   Limitations Walking;House hold activities;Standing   Patient Stated Goals "I want to get to where I can walk."   Currently in Pain? No/denies               Balance Exercises - 01/09/16 1502      OTAGO PROGRAM   Head Movements Sitting;5 reps   Neck Movements Sitting;5 reps   Back Extension Standing;5 reps   Trunk Movements Standing;5 reps   Ankle Movements Sitting;10 reps   Knee Extensor 10 reps  with red band resistance   Knee Flexor 10 reps   Hip ABductor 10 reps           PT Education - 01/09/16 1438    Education provided Yes   Education Details information on orthostatic hypotension; added to [employer]) Educated Patient   Methods Explanation;Demonstration;Verbal cues;Handout   Comprehension Verbalized understanding;Returned demonstration;Verbal cues required;Need further instruction          PT Short Term Goals - 12/26/15 2215      PT SHORT TERM GOAL #1   Title Patient demonstrates understanding of [place] with daughter cueing. (Target Date: 01/25/2016)  Time 4   Period Weeks   Status New     PT SHORT TERM GOAL #2   Title Timed Up-Go with rollator walker <31sec with supervision. (Target Date: 01/25/2016)   Time 4   Period Weeks   Status New     PT SHORT TERM GOAL #3   Title Patient and dtr verbalize understanding of Orthostatic Hypotension recomendations. (Target Date: 01/25/2016)   Time 4   Period Weeks   Status New     PT SHORT TERM GOAL #4   Title Patient ambulates 150' with rollator walker with supervision /verbal cues. (Target Date: 01/25/2016)   Time 4   Period Weeks   Status New           PT Long Term Goals - 12/26/15 2209      PT LONG TERM GOAL #1   Title Patient with daughters cueing is able to demonstrate understanding of ongoing HEP / fitness plan. (Target Date: 02/22/2016)   Time 8   Period  Weeks   Status New     PT LONG TERM GOAL #2   Title Berg Balance >/= 27/56 to indicate lower fall risk. (Target Date: 02/22/2016)   Time 8   Period Weeks   Status New     PT LONG TERM GOAL #3   Title Timed Up-Go with rollator walker <28sec to indicate lower fall risk. (Target Date: 02/22/2016)   Time 8   Period Weeks   Status New     PT LONG TERM GOAL #4   Title Patient and daughter verbalize understanding of fall prevention strategies including orthostatic hypotension recommendations. (Target Date: 02/22/2016)   Time 8   Period Weeks   Status New     PT LONG TERM GOAL #5   Title Patient ambulates 250' with rollator walker with supervision. (Target Date: 02/22/2016)   Time 8   Period Weeks   Status New     Additional Long Term Goals   Additional Long Term Goals Yes     PT LONG TERM GOAL #6   Title Patient negotiates ramps & curbs with rollator walker with minA from dtr safely. (Target Date: 02/22/2016)   Time 8   Period Weeks   Status New           Plan - 01/09/16 1500    Clinical Impression Statement Today's session addressed providing information on orthostatic hypotension and advancing OTAGO program as able. Pt is making slow, steady progress toward goals and should benefit from continued PT to progress toward unmet goals.   Rehab Potential Good   PT Frequency 2x / week   PT Duration 8 weeks   PT Treatment/Interventions ADLs/Self Care Home Management;DME Instruction;Gait training;Stair training;Functional mobility training;Therapeutic activities;Therapeutic exercise;Balance training;Neuromuscular re-education;Patient/family education   PT Next Visit Plan [place] [facility], fall prevention strategies   Consulted and Agree with Plan of Care Patient;Family member/caregiver   Family Member Consulted dtr        Patient will benefit from skilled therapeutic intervention in order to improve the following deficits and impairments:  Abnormal gait, Decreased activity tolerance,  Decreased cognition, Decreased balance, Decreased knowledge of use of DME, Decreased mobility, Decreased range of motion, Dizziness, Decreased strength, Postural dysfunction, Obesity, Pain  Visit Diagnosis: Dizziness and giddiness  Other abnormalities of gait and mobility  Muscle weakness (generalized)  Unsteadiness on feet     Problem List Patient Active Problem List   Diagnosis Date Noted  . Memory loss 10/22/2012    Willow Ora, PTA,  [facility] [address] [place],  [address] [phone] 01/10/16, 2:45 PM   Name: Candice Miller MRN: [id number] Date of Birth: 1924/05/17

## 2016-01-11 ENCOUNTER — Encounter: Payer: Self-pay | Admitting: Physical Therapy

## 2016-01-11 ENCOUNTER — Ambulatory Visit: Payer: Medicare Other | Admitting: Physical Therapy

## 2016-01-11 DIAGNOSIS — R2689 Other abnormalities of gait and mobility: Secondary | ICD-10-CM | POA: Diagnosis not present

## 2016-01-11 DIAGNOSIS — R2681 Unsteadiness on feet: Secondary | ICD-10-CM | POA: Diagnosis not present

## 2016-01-11 DIAGNOSIS — R42 Dizziness and giddiness: Secondary | ICD-10-CM

## 2016-01-11 DIAGNOSIS — M6281 Muscle weakness (generalized): Secondary | ICD-10-CM | POA: Diagnosis not present

## 2016-01-11 NOTE — Therapy (Signed)
[facility] [address] [place], [place], [address] Phone: [phone]   Fax:  [phone]  Physical Therapy Treatment  Patient Details  Name: Candice Miller MRN: [id number] Date of Birth: [birth year] Referring Provider: Melinda Crutch, MD  Encounter Date: 01/11/2016      PT End of Session - 01/11/16 1535    Visit Number 3   Number of Visits 17   Date for PT Re-Evaluation 02/22/16   Authorization Type Medicare G-code & progress note   PT Start Time 1532   PT Stop Time 1615   PT Time Calculation (min) 43 min   Equipment Utilized During Treatment Gait belt   Activity Tolerance Patient tolerated treatment well;Patient limited by fatigue   Behavior During Therapy [facility] for tasks assessed/performed      History reviewed. No pertinent past medical history.  History reviewed. No pertinent surgical history.  There were no vitals filed for this visit.      Subjective Assessment - 01/11/16 1534    Subjective No new complaints. No new falls or pain to report. Has not had a chance to perform OTAGO program at home since last visit.    Patient is accompained by: Family member   Pertinent History dementia, hard of hearing, COPD, HTN, OA.osteoporosis   Limitations Walking;House hold activities;Standing   Patient Stated Goals "I want to get to where I can walk."   Currently in Pain? No/denies             Balance Exercises - 01/11/16 1537      OTAGO PROGRAM   Head Movements Standing;5 reps   Neck Movements Standing;5 reps   Back Extension 5 reps;Standing   Trunk Movements Standing;5 reps   Ankle Movements Standing;10 reps   Knee Extensor 10 reps  with red thera band   Knee Flexor 10 reps   Hip ABductor 10 reps   Ankle Plantorflexors 20 reps, support   Ankle Dorsiflexors 20 reps, support   Knee Bends 10 reps, support   Backwards Walking Support   Sideways Walking Assistive device   Overall OTAGO Comments used  counter top for balance. cues on posture and ex form needed.             PT Short Term Goals - 12/26/15 2215      PT SHORT TERM GOAL #1   Title Patient demonstrates understanding of [place] with daughter cueing. (Target Date: 01/25/2016)   Time 4   Period Weeks   Status New     PT SHORT TERM GOAL #2   Title Timed Up-Go with rollator walker <31sec with supervision. (Target Date: 01/25/2016)   Time 4   Period Weeks   Status New     PT SHORT TERM GOAL #3   Title Patient and dtr verbalize understanding of Orthostatic Hypotension recomendations. (Target Date: 01/25/2016)   Time 4   Period Weeks   Status New     PT SHORT TERM GOAL #4   Title Patient ambulates 150' with rollator walker with supervision /verbal cues. (Target Date: 01/25/2016)   Time 4   Period Weeks   Status New           PT Long Term Goals - 12/26/15 2209      PT LONG TERM GOAL #1   Title Patient with daughters cueing is able to demonstrate understanding of ongoing HEP / fitness plan. (Target Date: 02/22/2016)   Time 8   Period Weeks   Status New  PT LONG TERM GOAL #2   Title Berg Balance >/= 27/56 to indicate lower fall risk. (Target Date: 02/22/2016)   Time 8   Period Weeks   Status New     PT LONG TERM GOAL #3   Title Timed Up-Go with rollator walker <28sec to indicate lower fall risk. (Target Date: 02/22/2016)   Time 8   Period Weeks   Status New     PT LONG TERM GOAL #4   Title Patient and daughter verbalize understanding of fall prevention strategies including orthostatic hypotension recommendations. (Target Date: 02/22/2016)   Time 8   Period Weeks   Status New     PT LONG TERM GOAL #5   Title Patient ambulates 250' with rollator walker with supervision. (Target Date: 02/22/2016)   Time 8   Period Weeks   Status New     Additional Long Term Goals   Additional Long Term Goals Yes     PT LONG TERM GOAL #6   Title Patient negotiates ramps & curbs with rollator walker with  minA from dtr safely. (Target Date: 02/22/2016)   Time 8   Period Weeks   Status New            Plan - 01/11/16 1535    Clinical Impression Statement Today's session reviewed current OTAGO exercises and added to the program. Pt does fatigue and need short rest breaks. Pt also has slow, guarded movements with all standing exercises needing increased time with them. Pt is progressing toward goals and should benefit from continued PT to progress toward unmet goals.    Rehab Potential Good   PT Frequency 2x / week   PT Duration 8 weeks   PT Treatment/Interventions ADLs/Self Care Home Management;DME Instruction;Gait training;Stair training;Functional mobility training;Therapeutic activities;Therapeutic exercise;Balance training;Neuromuscular re-education;Patient/family education   PT Next Visit Plan Complete OTAGO HEP, fall prevention strategies   Consulted and Agree with Plan of Care Patient;Family member/caregiver   Family Member Consulted dtr      Patient will benefit from skilled therapeutic intervention in order to improve the following deficits and impairments:  Abnormal gait, Decreased activity tolerance, Decreased cognition, Decreased balance, Decreased knowledge of use of DME, Decreased mobility, Decreased range of motion, Dizziness, Decreased strength, Postural dysfunction, Obesity, Pain  Visit Diagnosis: Dizziness and giddiness  Other abnormalities of gait and mobility  Muscle weakness (generalized)  Unsteadiness on feet     Problem List Patient Active Problem List   Diagnosis Date Noted  . Memory loss 10/22/2012    Willow Ora, PTA, [facility] [address], [place], [place] [address] [phone] 01/11/16, 4:18 PM   Name: Candice Miller MRN: [id number] Date of Birth: Sep 04, 1924

## 2016-01-16 ENCOUNTER — Encounter: Payer: Self-pay | Admitting: Physical Therapy

## 2016-01-16 ENCOUNTER — Ambulatory Visit: Payer: Medicare Other | Attending: Family Medicine | Admitting: Physical Therapy

## 2016-01-16 DIAGNOSIS — M6281 Muscle weakness (generalized): Secondary | ICD-10-CM | POA: Diagnosis not present

## 2016-01-16 DIAGNOSIS — R2681 Unsteadiness on feet: Secondary | ICD-10-CM | POA: Diagnosis not present

## 2016-01-16 DIAGNOSIS — R42 Dizziness and giddiness: Secondary | ICD-10-CM | POA: Diagnosis not present

## 2016-01-16 DIAGNOSIS — J449 Chronic obstructive pulmonary disease, unspecified: Secondary | ICD-10-CM | POA: Diagnosis not present

## 2016-01-16 DIAGNOSIS — R2689 Other abnormalities of gait and mobility: Secondary | ICD-10-CM | POA: Diagnosis not present

## 2016-01-16 DIAGNOSIS — Z23 Encounter for immunization: Secondary | ICD-10-CM | POA: Diagnosis not present

## 2016-01-16 NOTE — Therapy (Signed)
[facility] [address] [place], [place], [address] Phone: [phone]   Fax:  [phone]  Physical Therapy Treatment  Patient Details  Name: Candice Miller MRN: [id number] Date of Birth: 06/26/1924 Referring Provider: Melinda Crutch, MD  Encounter Date: 01/16/2016      PT End of Session - 01/16/16 1538    Visit Number 4   Number of Visits 17   Date for PT Re-Evaluation 02/22/16   Authorization Type Medicare G-code & progress note   PT Start Time [id number]   PT Stop Time 1620   PT Time Calculation (min) 47 min   Equipment Utilized During Treatment Gait belt   Activity Tolerance Patient tolerated treatment well;Patient limited by fatigue   Behavior During Therapy [facility] for tasks assessed/performed      History reviewed. No pertinent past medical history.  History reviewed. No pertinent surgical history.  There were no vitals filed for this visit.      Subjective Assessment - 01/16/16 1537    Subjective No new complaints. No falls or pain to report. Has not done the OTAGO program at home as yet.   Patient is accompained by: Family member  daughter   Pertinent History dementia, hard of hearing, COPD, HTN, OA.osteoporosis   Limitations Walking;House hold activities;Standing   Patient Stated Goals "I want to get to where I can walk."   Currently in Pain? No/denies            Balance Exercises - 01/16/16 1626      OTAGO PROGRAM   Ankle Plantorflexors 20 reps, support   Ankle Dorsiflexors 20 reps, support   Knee Bends 10 reps, support   Backwards Walking Support  counter top   Walking and Turning Around Assistive device  targets on floor, use of rollator to perform figure 8   Sideways Walking Assistive device  counter top   Tandem Stance 10 seconds, support  counter top   Tandem Walk Support  with counter x1, rollator x1   One Leg Stand 10 seconds, support  counter   Heel Walking Support  with rollator    Toe Walk Support  with rollator   Sit to Stand 10 reps, bilateral support   Overall OTAGO Comments pt's daughter present and educated on how to cue/guard and assist pt as needed. pt needed min guard to min assist at times with balance components of OTAGO             PT Short Term Goals - 12/26/15 2215      PT SHORT TERM GOAL #1   Title Patient demonstrates understanding of [place] with daughter cueing. (Target Date: 01/25/2016)   Time 4   Period Weeks   Status New     PT SHORT TERM GOAL #2   Title Timed Up-Go with rollator walker <31sec with supervision. (Target Date: 01/25/2016)   Time 4   Period Weeks   Status New     PT SHORT TERM GOAL #3   Title Patient and dtr verbalize understanding of Orthostatic Hypotension recomendations. (Target Date: 01/25/2016)   Time 4   Period Weeks   Status New     PT SHORT TERM GOAL #4   Title Patient ambulates 150' with rollator walker with supervision /verbal cues. (Target Date: 01/25/2016)   Time 4   Period Weeks   Status New           PT Long Term Goals - 12/26/15 2209      PT  LONG TERM GOAL #1   Title Patient with daughters cueing is able to demonstrate understanding of ongoing HEP / fitness plan. (Target Date: 02/22/2016)   Time 8   Period Weeks   Status New     PT LONG TERM GOAL #2   Title Berg Balance >/= 27/56 to indicate lower fall risk. (Target Date: 02/22/2016)   Time 8   Period Weeks   Status New     PT LONG TERM GOAL #3   Title Timed Up-Go with rollator walker <28sec to indicate lower fall risk. (Target Date: 02/22/2016)   Time 8   Period Weeks   Status New     PT LONG TERM GOAL #4   Title Patient and daughter verbalize understanding of fall prevention strategies including orthostatic hypotension recommendations. (Target Date: 02/22/2016)   Time 8   Period Weeks   Status New     PT LONG TERM GOAL #5   Title Patient ambulates 250' with rollator walker with supervision. (Target Date: 02/22/2016)    Time 8   Period Weeks   Status New     Additional Long Term Goals   Additional Long Term Goals Yes     PT LONG TERM GOAL #6   Title Patient negotiates ramps & curbs with rollator walker with minA from dtr safely. (Target Date: 02/22/2016)   Time 8   Period Weeks   Status New            Plan - 01/16/16 1539    Clinical Impression Statement With today's session reviewed new exercises added on Friday and then completed the [place] program today. Pt continues to fatigue quickly with exercises and needed several short seated rest breaks. Pt needed min guard to min assist for balance with some balance parts of the program. Pt is making steady progress toward goals and should benefit from continued from additional PT to progress toward unmet goals.                                             Rehab Potential Good   PT Frequency 2x / week   PT Duration 8 weeks   PT Treatment/Interventions ADLs/Self Care Home Management;DME Instruction;Gait training;Stair training;Functional mobility training;Therapeutic activities;Therapeutic exercise;Balance training;Neuromuscular re-education;Patient/family education   PT Next Visit Plan fall prevention strategies, work on gait with rollator including barriers, balance in single leg stance/complaint surfaces   Consulted and Agree with Plan of Care Patient;Family member/caregiver   Family Member Consulted dtr      Patient will benefit from skilled therapeutic intervention in order to improve the following deficits and impairments:  Abnormal gait, Decreased activity tolerance, Decreased cognition, Decreased balance, Decreased knowledge of use of DME, Decreased mobility, Decreased range of motion, Dizziness, Decreased strength, Postural dysfunction, Obesity, Pain  Visit Diagnosis: Dizziness and giddiness  Other abnormalities of gait and mobility  Muscle weakness (generalized)  Unsteadiness on feet     Problem List Patient Active Problem List    Diagnosis Date Noted  . Memory loss 10/22/2012    Willow Ora, PTA, [facility] [address] [place], [place] [address] [phone] 01/16/16, 4:35 PM   Name: Candice Miller MRN: [id number] Date of Birth: 09/24/1924

## 2016-01-18 ENCOUNTER — Ambulatory Visit: Payer: Medicare Other | Admitting: Physical Therapy

## 2016-01-18 ENCOUNTER — Encounter: Payer: Self-pay | Admitting: Physical Therapy

## 2016-01-18 DIAGNOSIS — R2681 Unsteadiness on feet: Secondary | ICD-10-CM

## 2016-01-18 DIAGNOSIS — M6281 Muscle weakness (generalized): Secondary | ICD-10-CM | POA: Diagnosis not present

## 2016-01-18 DIAGNOSIS — R2689 Other abnormalities of gait and mobility: Secondary | ICD-10-CM | POA: Diagnosis not present

## 2016-01-18 DIAGNOSIS — R42 Dizziness and giddiness: Secondary | ICD-10-CM

## 2016-01-18 NOTE — Therapy (Signed)
[facility] [address] [place], [place], [address] Phone: [phone]   Fax:  [phone]  Physical Therapy Treatment  Patient Details  Name: Candice Miller MRN: [id number] Date of Birth: September 13, 1924 Referring Provider: Melinda Crutch, MD  Encounter Date: 01/18/2016      PT End of Session - 01/18/16 1539    Visit Number 5   Number of Visits 17   Date for PT Re-Evaluation 02/22/16   Authorization Type Medicare G-code & progress note   PT Start Time 1536   PT Stop Time 1617   PT Time Calculation (min) 41 min   Equipment Utilized During Treatment Gait belt   Activity Tolerance Patient tolerated treatment well;Patient limited by fatigue   Behavior During Therapy [facility] for tasks assessed/performed      History reviewed. No pertinent past medical history.  History reviewed. No pertinent surgical history.  There were no vitals filed for this visit.      Subjective Assessment - 01/18/16 1538    Subjective No new complaints. No falls or pain to report. Has done some parts of OTAGO, not any of the balance portions as yet.   Patient is accompained by: Family member  son in law- in lobby   Pertinent History dementia, hard of hearing, COPD, HTN, OA.osteoporosis   Limitations Walking;House hold activities;Standing   Patient Stated Goals "I want to get to where I can walk."   Currently in Pain? No/denies              [facility] Adult PT Treatment/Exercise - 01/18/16 1540      Transfers   Transfers Sit to Stand;Stand to Sit   Sit to Stand 5: Supervision;With upper extremity assist;With armrests;From chair/3-in-1   Sit to Stand Details Verbal cues for sequencing;Verbal cues for technique;Verbal cues for precautions/safety;Verbal cues for safe use of DME/AE   Sit to Stand Details (indicate cue type and reason) cues to scoot forward and for hand placement on surface vs pulling up on rollator. cues to keep rollator locked until  after standing   Stand to Sit 5: Supervision;With upper extremity assist;To chair/3-in-1;With armrests   Stand to Sit Details (indicate cue type and reason) Verbal cues for sequencing;Verbal cues for technique;Verbal cues for precautions/safety;Verbal cues for safe use of DME/AE   Stand to Sit Details cues to lock rollator after turing completely to surface to be sat on and to reach back/use arms/legs to control descent with sitting down.     Ambulation/Gait   Ambulation/Gait Yes   Ambulation/Gait Assistance 4: Min guard   Ambulation/Gait Assistance Details increased shuffling noted toward end of gait, cues on posture, increased hip/knee flexion/DF for foot clearance and on rollator position with gait.    Ambulation Distance (Feet) 115 Feet  x2 reps   Assistive device Rollator   Gait Pattern Step-through pattern;Decreased stride length;Scissoring;Trunk flexed;Narrow base of support;Poor foot clearance - left;Poor foot clearance - right   Ambulation Surface Level;Indoor     Knee/Hip Exercises: Aerobic   Nustep with bil UE/LE's, level 2 x 8 minutes with goal >/= 35 steps per minute for strengthening and activity tolerance                         cues to keep going, stay on task as pt is easily distracted             Balance Exercises - 01/18/16 1549      Balance Exercises: Standing  Standing Eyes Closed Wide (BOA);Solid surface;Head turns;Other reps (comment);Limitations;Narrow base of support (BOS)     Balance Exercises: Standing   Standing Eyes Closed Limitations on floor surface pt progressed from wide base of support to narrow base support, no UE assist: EC no head movements, EC head movements left<>right and up<>down x 10 reps each with min guard to min assist for balance.                                         PT Short Term Goals - 12/26/15 2215      PT SHORT TERM GOAL #1   Title Patient demonstrates understanding of [place] with daughter cueing. (Target Date:  01/25/2016)   Time 4   Period Weeks   Status New     PT SHORT TERM GOAL #2   Title Timed Up-Go with rollator walker <31sec with supervision. (Target Date: 01/25/2016)   Time 4   Period Weeks   Status New     PT SHORT TERM GOAL #3   Title Patient and dtr verbalize understanding of Orthostatic Hypotension recomendations. (Target Date: 01/25/2016)   Time 4   Period Weeks   Status New     PT SHORT TERM GOAL #4   Title Patient ambulates 150' with rollator walker with supervision /verbal cues. (Target Date: 01/25/2016)   Time 4   Period Weeks   Status New           PT Long Term Goals - 12/26/15 2209      PT LONG TERM GOAL #1   Title Patient with daughters cueing is able to demonstrate understanding of ongoing HEP / fitness plan. (Target Date: 02/22/2016)   Time 8   Period Weeks   Status New     PT LONG TERM GOAL #2   Title Berg Balance >/= 27/56 to indicate lower fall risk. (Target Date: 02/22/2016)   Time 8   Period Weeks   Status New     PT LONG TERM GOAL #3   Title Timed Up-Go with rollator walker <28sec to indicate lower fall risk. (Target Date: 02/22/2016)   Time 8   Period Weeks   Status New     PT LONG TERM GOAL #4   Title Patient and daughter verbalize understanding of fall prevention strategies including orthostatic hypotension recommendations. (Target Date: 02/22/2016)   Time 8   Period Weeks   Status New     PT LONG TERM GOAL #5   Title Patient ambulates 250' with rollator walker with supervision. (Target Date: 02/22/2016)   Time 8   Period Weeks   Status New     Additional Long Term Goals   Additional Long Term Goals Yes     PT LONG TERM GOAL #6   Title Patient negotiates ramps & curbs with rollator walker with minA from dtr safely. (Target Date: 02/22/2016)   Time 8   Period Weeks   Status New            Plan - 01/18/16 1539    Clinical Impression Statement Today's session focused on gait, balance, and LE strengthening/activity  tolerance. Pt does continue to fatigue with activity, needing short rest breaks. Pt is making steady progress toward goals and should benefit from continued PT to progress toward unmet goals.    Rehab Potential Good   PT Frequency 2x / week   PT Duration  8 weeks   PT Treatment/Interventions ADLs/Self Care Home Management;DME Instruction;Gait training;Stair training;Functional mobility training;Therapeutic activities;Therapeutic exercise;Balance training;Neuromuscular re-education;Patient/family education   PT Next Visit Plan fall prevention strategies, work on gait with rollator including barriers, balance in single leg stance/complaint surfaces with UE support as needed   Consulted and Agree with Plan of Care Patient;Family member/caregiver   Family Member Consulted dtr      Patient will benefit from skilled therapeutic intervention in order to improve the following deficits and impairments:  Abnormal gait, Decreased activity tolerance, Decreased cognition, Decreased balance, Decreased knowledge of use of DME, Decreased mobility, Decreased range of motion, Dizziness, Decreased strength, Postural dysfunction, Obesity, Pain  Visit Diagnosis: Dizziness and giddiness  Other abnormalities of gait and mobility  Muscle weakness (generalized)  Unsteadiness on feet     Problem List Patient Active Problem List   Diagnosis Date Noted  . Memory loss 10/22/2012    Willow Ora, PTA, [facility] [address] [place], [place] [address] [phone] 01/18/16, 4:29 PM   Name: Eilyn Mato MRN: [id number] Date of Birth: Jul 20, 1924

## 2016-01-23 ENCOUNTER — Ambulatory Visit: Payer: Medicare Other | Admitting: Physical Therapy

## 2016-01-23 ENCOUNTER — Encounter: Payer: Self-pay | Admitting: Physical Therapy

## 2016-01-23 DIAGNOSIS — M6281 Muscle weakness (generalized): Secondary | ICD-10-CM

## 2016-01-23 DIAGNOSIS — R42 Dizziness and giddiness: Secondary | ICD-10-CM

## 2016-01-23 DIAGNOSIS — R2689 Other abnormalities of gait and mobility: Secondary | ICD-10-CM | POA: Diagnosis not present

## 2016-01-23 DIAGNOSIS — R2681 Unsteadiness on feet: Secondary | ICD-10-CM

## 2016-01-24 NOTE — Therapy (Signed)
[facility] [address] [place], [place], [address] Phone: [phone]   Fax:  [phone]  Physical Therapy Treatment  Patient Details  Name: Candice Miller MRN: [id number] Date of Birth: 04/03/1925 Referring Provider: Melinda Crutch, MD  Encounter Date: 01/23/2016      PT End of Session - 01/23/16 1538    Visit Number 6   Number of Visits 17   Date for PT Re-Evaluation 02/22/16   Authorization Type Medicare G-code & progress note   PT Start Time [id number]   PT Stop Time 1615   PT Time Calculation (min) 40 min   Equipment Utilized During Treatment Gait belt   Activity Tolerance Patient tolerated treatment well;Patient limited by fatigue   Behavior During Therapy [facility] for tasks assessed/performed      History reviewed. No pertinent past medical history.  History reviewed. No pertinent surgical history.  There were no vitals filed for this visit.      Subjective Assessment - 01/23/16 1538    Subjective No new complaints. No falls or pain to report. Has done OTAGO some at home.,   Patient is accompained by: Family member  daughter   Pertinent History dementia, hard of hearing, COPD, HTN, OA.osteoporosis   Limitations Walking;House hold activities;Standing   Patient Stated Goals "I want to get to where I can walk."   Currently in Pain? No/denies             [facility] Adult PT Treatment/Exercise - 01/23/16 1539      Transfers   Transfers Sit to Stand;Stand to Sit   Sit to Stand 5: Supervision;With upper extremity assist;With armrests;From chair/3-in-1   Sit to Stand Details Verbal cues for sequencing;Verbal cues for technique;Verbal cues for precautions/safety;Verbal cues for safe use of DME/AE   Stand to Sit 5: Supervision;With upper extremity assist;To chair/3-in-1;With armrests   Stand to Sit Details (indicate cue type and reason) Verbal cues for sequencing;Verbal cues for technique;Verbal cues for  precautions/safety;Verbal cues for safe use of DME/AE     Ambulation/Gait   Ambulation/Gait Yes   Ambulation/Gait Assistance 5: Supervision;4: Min guard   Ambulation/Gait Assistance Details occasional cues on posture and rollator position with gait   Ambulation Distance (Feet) 220 Feet  x1, 235 x1 in/out doors   Assistive device Rollator   Gait Pattern Step-through pattern;Decreased stride length;Scissoring;Trunk flexed;Narrow base of support;Poor foot clearance - left;Poor foot clearance - right   Ambulation Surface Level;Indoor;Unlevel;Outdoor;Paved   Ramp 4: Min assist   Ramp Details (indicate cue type and reason) outdoors with cues on rollator brake use to improve control over rollator   Curb Other (comment);4: Min assist  min guard assist with rollator   Curb Details (indicate cue type and reason) outdoor curb wtih cues on sequencing and technique with rollator     Knee/Hip Exercises: Standing   Heel Raises Both;1 set;10 reps;Limitations   Heel Raises Limitations 2# ankle weights bil legs with UE support on rollator, cues for knee extension and to lift as high as possible   Hip Flexion AROM;Stengthening;Both;1 set;10 reps;Knee bent;Limitations   Hip Flexion Limitations with UE support on rollator: alternating marching wiht 2# ankle weights, cues on posture and to lift knees as high as possible           Balance Exercises - 01/23/16 1555      Balance Exercises: Standing   Standing Eyes Opened Wide (BOA);Head turns;Foam/compliant surface;Other reps (comment);Limitations   Standing Eyes Closed Wide (BOA);Foam/compliant surface;2 reps;Limitations   SLS  with Vectors Solid surface;Upper extremity assist 2;Other reps (comment);Limitations     Balance Exercises: Standing   Standing Eyes Opened Limitations on airex with wide base of support: head movements left<>right and up<>down x 10 reps each with min to mod assist for balance, no UE support   Standing Eyes Closed Limitations on  airex with wide base of support: EC no head movements 15 sec's x 2 reps with min to mod assist for balance   SLS with Vectors Limitations with foam bubbles: alternating fwd toe taps to each and alternating cross toe taps to each with min HHA for balance, cues for maintaining wider base of support for improved weight shifting to allow for improved stance control for balance.                               PT Short Term Goals - 12/26/15 2215      PT SHORT TERM GOAL #1   Title Patient demonstrates understanding of [place] with daughter cueing. (Target Date: 01/25/2016)   Time 4   Period Weeks   Status New     PT SHORT TERM GOAL #2   Title Timed Up-Go with rollator walker <31sec with supervision. (Target Date: 01/25/2016)   Time 4   Period Weeks   Status New     PT SHORT TERM GOAL #3   Title Patient and dtr verbalize understanding of Orthostatic Hypotension recomendations. (Target Date: 01/25/2016)   Time 4   Period Weeks   Status New     PT SHORT TERM GOAL #4   Title Patient ambulates 150' with rollator walker with supervision /verbal cues. (Target Date: 01/25/2016)   Time 4   Period Weeks   Status New           PT Long Term Goals - 12/26/15 2209      PT LONG TERM GOAL #1   Title Patient with daughters cueing is able to demonstrate understanding of ongoing HEP / fitness plan. (Target Date: 02/22/2016)   Time 8   Period Weeks   Status New     PT LONG TERM GOAL #2   Title [place] Balance >/= 27/56 to indicate lower fall risk. (Target Date: 02/22/2016)   Time 8   Period Weeks   Status New     PT LONG TERM GOAL #3   Title Timed Up-Go with rollator walker <28sec to indicate lower fall risk. (Target Date: 02/22/2016)   Time 8   Period Weeks   Status New     PT LONG TERM GOAL #4   Title Patient and daughter verbalize understanding of fall prevention strategies including orthostatic hypotension recommendations. (Target Date: 02/22/2016)   Time 8   Period Weeks    Status New     PT LONG TERM GOAL #5   Title Patient ambulates 250' with rollator walker with supervision. (Target Date: 02/22/2016)   Time 8   Period Weeks   Status New     Additional Long Term Goals   Additional Long Term Goals Yes     PT LONG TERM GOAL #6   Title Patient negotiates ramps & curbs with rollator walker with minA from dtr safely. (Target Date: 02/22/2016)   Time 8   Period Weeks   Status New             Plan - 01/23/16 1538    Clinical Impression Statement today's session continued to address gait, including  barriers, with rollator and LE strenghening/balance activites. No issues reported with session. Pt does fatigue with activity and needed short seated rest breaks. Pt does need increased assist for balance as well. Pt is making steady progress and should benefit from continued PT to progress toward unmet goals.                          Rehab Potential Good   PT Frequency 2x / week   PT Duration 8 weeks   PT Treatment/Interventions ADLs/Self Care Home Management;DME Instruction;Gait training;Stair training;Functional mobility training;Therapeutic activities;Therapeutic exercise;Balance training;Neuromuscular re-education;Patient/family education   PT Next Visit Plan fall prevention strategies, work on gait with rollator including barriers, balance in single leg stance/complaint surfaces with UE support as needed   Consulted and Agree with Plan of Care Patient;Family member/caregiver   Family Member Consulted dtr      Patient will benefit from skilled therapeutic intervention in order to improve the following deficits and impairments:  Abnormal gait, Decreased activity tolerance, Decreased cognition, Decreased balance, Decreased knowledge of use of DME, Decreased mobility, Decreased range of motion, Dizziness, Decreased strength, Postural dysfunction, Obesity, Pain  Visit Diagnosis: Dizziness and giddiness  Other abnormalities of gait and mobility  Muscle  weakness (generalized)  Unsteadiness on feet     Problem List Patient Active Problem List   Diagnosis Date Noted  . Memory loss 10/22/2012    Willow Ora, PTA, [facility] [address], [place], [place] [address] [phone] 01/24/16, 1:15 PM   Name: Cytlaly Norum MRN: [id number] Date of Birth: [birth year]

## 2016-01-25 ENCOUNTER — Encounter: Payer: Self-pay | Admitting: Physical Therapy

## 2016-01-25 ENCOUNTER — Ambulatory Visit: Payer: Medicare Other | Admitting: Physical Therapy

## 2016-01-25 DIAGNOSIS — M6281 Muscle weakness (generalized): Secondary | ICD-10-CM

## 2016-01-25 DIAGNOSIS — R2689 Other abnormalities of gait and mobility: Secondary | ICD-10-CM | POA: Diagnosis not present

## 2016-01-25 DIAGNOSIS — R42 Dizziness and giddiness: Secondary | ICD-10-CM | POA: Diagnosis not present

## 2016-01-25 DIAGNOSIS — R2681 Unsteadiness on feet: Secondary | ICD-10-CM | POA: Diagnosis not present

## 2016-01-28 NOTE — Therapy (Signed)
[facility] [address] [place], [place], [address] Phone: [phone]   Fax:  [phone]  Physical Therapy Treatment  Patient Details  Name: Candice Miller MRN: [id number] Date of Birth: [birth year] Referring Provider: Melinda Crutch, MD  Encounter Date: 01/25/2016     01/25/16 1535  PT Visits / Re-Eval  Visit Number 7  Number of Visits 17  Date for PT Re-Evaluation 02/22/16  Authorization  Authorization Type Medicare G-code & progress note  PT Time Calculation  PT Start Time 1532  PT Stop Time 1615  PT Time Calculation (min) 43 min  PT - End of Session  Equipment Utilized During Treatment Gait belt  Activity Tolerance Patient tolerated treatment well;Patient limited by fatigue  Behavior During Therapy [facility] for tasks assessed/performed    History reviewed. No pertinent past medical history.  History reviewed. No pertinent surgical history.  There were no vitals filed for this visit.     01/25/16 1534  Symptoms/Limitations  Subjective No new complaints. No falls or pain to report.   Patient is accompained by: Family member (son in law in lobby)  Pertinent History dementia, hard of hearing, COPD, HTN, OA.osteoporosis  Limitations Walking;House hold activities;Standing  Patient Stated Goals "I want to get to where I can walk."  Pain Assessment  Currently in Pain? No/denies       01/25/16 1541  Transfers  Transfers Sit to Stand;Stand to Sit  Sit to Stand 5: Supervision;With upper extremity assist;With armrests;From chair/3-in-1  Sit to Stand Details Verbal cues for sequencing;Verbal cues for technique;Verbal cues for precautions/safety;Verbal cues for safe use of DME/AE  Stand to Sit 5: Supervision;With upper extremity assist;To chair/3-in-1;With armrests  Stand to Sit Details (indicate cue type and reason) Verbal cues for sequencing;Verbal cues for technique;Verbal cues for precautions/safety;Verbal cues  for safe use of DME/AE  Ambulation/Gait  Ambulation/Gait Yes  Ambulation/Gait Assistance 5: Supervision  Ambulation/Gait Assistance Details occasional cues on walker position with gait. right foot scuffing ~2-3 times only wiht last 12-15 feet of gait.  Ambulation Distance (Feet) 220 Feet (x1. 450 x1)  Assistive device Rollator  Gait Pattern Step-through pattern;Decreased stride length;Scissoring;Trunk flexed;Narrow base of support;Poor foot clearance - left;Poor foot clearance - right  Ambulation Surface Level;Indoor  Timed Up and Go Test  TUG Normal TUG  Normal TUG (seconds) 24.72 (with rollator)  Neuro Re-ed   Neuro Re-ed Details  standing on airex with rollator in front on her and mat behind her: narrow base of support- EC no head movements, EO head movements up<>down and left <>right; narrow base of support: EC no head movement, EO head movements up<>down and left<>right. min to mod assist for balance needed.                                  Knee/Hip Exercises: Aerobic  Nustep with bil UE/LE's, level 3 x 8 minutes with goal >/= 35 steps per minute for strengthening and activity tolerance                                 PT Short Term Goals - 01/25/16 1535      PT SHORT TERM GOAL #1   Title Patient demonstrates understanding of [place] with daughter cueing. (Target Date: 01/25/2016)   Baseline 01/25/16: met today   Status Achieved     PT SHORT TERM GOAL #  2   Title Timed Up-Go with rollator walker <31sec with supervision. (Target Date: 01/25/2016)   Baseline 01/25/16: met with score of 24.72 sec's with rollator   Time --   Period --   Status Achieved     PT SHORT TERM GOAL #3   Title Patient and dtr verbalize understanding of Orthostatic Hypotension recomendations. (Target Date: 01/25/2016)   Baseline 01/09/16 met today   Status Achieved     PT SHORT TERM GOAL #4   Title Patient ambulates 150' with rollator walker with supervision /verbal cues. (Target Date: 01/25/2016)    Baseline 01/25/16: met today- 220 feet with rollator with supervision/occasional verbal cues needed.   Time --   Period --   Status Achieved           PT Long Term Goals - 12/26/15 2209      PT LONG TERM GOAL #1   Title Patient with daughters cueing is able to demonstrate understanding of ongoing HEP / fitness plan. (Target Date: 02/22/2016)   Time 8   Period Weeks   Status New     PT LONG TERM GOAL #2   Title Berg Balance >/= 27/56 to indicate lower fall risk. (Target Date: 02/22/2016)   Time 8   Period Weeks   Status New     PT LONG TERM GOAL #3   Title Timed Up-Go with rollator walker <28sec to indicate lower fall risk. (Target Date: 02/22/2016)   Time 8   Period Weeks   Status New     PT LONG TERM GOAL #4   Title Patient and daughter verbalize understanding of fall prevention strategies including orthostatic hypotension recommendations. (Target Date: 02/22/2016)   Time 8   Period Weeks   Status New     PT LONG TERM GOAL #5   Title Patient ambulates 250' with rollator walker with supervision. (Target Date: 02/22/2016)   Time 8   Period Weeks   Status New     Additional Long Term Goals   Additional Long Term Goals Yes     PT LONG TERM GOAL #6   Title Patient negotiates ramps & curbs with rollator walker with minA from dtr safely. (Target Date: 02/22/2016)   Time 8   Period Weeks   Status New        01/25/16 1535  Plan  Clinical Impression Statement Pt has met all STGs as of today. Pt is progressing towards LTGs as well with increased overall activity tolerance and less cues/assist needed with gait/transfers. Pt continues to be challenged by balance activites, especially on complaint surfaces. Pt should benefit from continued PT to progress toward unmet goals.                    Pt will benefit from skilled therapeutic intervention in order to improve on the following deficits Abnormal gait;Decreased activity tolerance;Decreased cognition;Decreased  balance;Decreased knowledge of use of DME;Decreased mobility;Decreased range of motion;Dizziness;Decreased strength;Postural dysfunction;Obesity;Pain  Rehab Potential Good  PT Frequency 2x / week  PT Duration 8 weeks  PT Treatment/Interventions ADLs/Self Care Home Management;DME Instruction;Gait training;Stair training;Functional mobility training;Therapeutic activities;Therapeutic exercise;Balance training;Neuromuscular re-education;Patient/family education  PT Next Visit Plan fall prevention strategies, work on gait with rollator including barriers, balance in single leg stance/complaint surfaces with UE support as needed  Consulted and Agree with Plan of Care Patient;Family member/caregiver  Family Member Consulted dtr      Patient will benefit from skilled therapeutic intervention in order to improve the following deficits and impairments:  Abnormal gait, Decreased activity tolerance, Decreased cognition, Decreased balance, Decreased knowledge of use of DME, Decreased mobility, Decreased range of motion, Dizziness, Decreased strength, Postural dysfunction, Obesity, Pain  Visit Diagnosis: Dizziness and giddiness  Other abnormalities of gait and mobility  Muscle weakness (generalized)  Unsteadiness on feet     Problem List Patient Active Problem List   Diagnosis Date Noted  . Memory loss 10/22/2012    Willow Ora, PTA, [facility] [address] [place], [place] [address] [phone] 01/28/16, 12:02 PM   Name: Candice Miller MRN: [id number] Date of Birth: October 20, 1924

## 2016-01-30 ENCOUNTER — Ambulatory Visit: Payer: Medicare Other | Admitting: Physical Therapy

## 2016-01-30 ENCOUNTER — Encounter: Payer: Self-pay | Admitting: Physical Therapy

## 2016-01-30 DIAGNOSIS — R2689 Other abnormalities of gait and mobility: Secondary | ICD-10-CM | POA: Diagnosis not present

## 2016-01-30 DIAGNOSIS — R2681 Unsteadiness on feet: Secondary | ICD-10-CM | POA: Diagnosis not present

## 2016-01-30 DIAGNOSIS — R42 Dizziness and giddiness: Secondary | ICD-10-CM

## 2016-01-30 DIAGNOSIS — M6281 Muscle weakness (generalized): Secondary | ICD-10-CM | POA: Diagnosis not present

## 2016-01-31 NOTE — Therapy (Signed)
[facility] [address] [place], [place], [address] Phone: [phone]   Fax:  [phone]  Physical Therapy Treatment  Patient Details  Name: Candice Miller MRN: [id number] Date of Birth: [birth year] Referring Provider: Melinda Crutch, MD  Encounter Date: 01/30/2016      PT End of Session - 01/30/16 1537    Visit Number 8   Number of Visits 17   Date for PT Re-Evaluation 02/22/16   Authorization Type Medicare G-code & progress note   PT Start Time 2182   PT Stop Time 1615   PT Time Calculation (min) 41 min   Equipment Utilized During Treatment Gait belt   Activity Tolerance Patient tolerated treatment well;Patient limited by fatigue   Behavior During Therapy [facility] for tasks assessed/performed      History reviewed. No pertinent past medical history.  History reviewed. No pertinent surgical history.  There were no vitals filed for this visit.      Subjective Assessment - 01/30/16 1537    Subjective No new complaints. No falls or pain to report.    Patient is accompained by: Family member   Pertinent History dementia, hard of hearing, COPD, HTN, OA.osteoporosis   Limitations Walking;House hold activities;Standing   Patient Stated Goals "I want to get to where I can walk."   Currently in Pain? No/denies             [facility] Adult PT Treatment/Exercise - 01/30/16 1538      Transfers   Transfers Sit to Stand;Stand to Sit   Sit to Stand 5: Supervision;With upper extremity assist;With armrests;From chair/3-in-1   Stand to Sit 5: Supervision;With upper extremity assist;To chair/3-in-1;With armrests   Stand to Sit Details cues to reach back with sitting down     Ambulation/Gait   Ambulation/Gait Yes   Ambulation/Gait Assistance 5: Supervision   Ambulation/Gait Assistance Details occasional verbal cues to decreased shuffling and for increased step length    Ambulation Distance (Feet) 450 Feet  x1   Assistive  device Rollator   Gait Pattern Step-through pattern;Decreased stride length;Shuffle;Narrow base of support  shuffles on occasion, not all of gait   Ambulation Surface Level;Indoor   Ramp 4: Min assist  with rollator   Ramp Details (indicate cue type and reason) x1 on indoor ramp, cues on sequencing and use of rollator   Curb 4: Min assist   Curb Details (indicate cue type and reason) x1 with indoor 6 inch curb, cues on sequencing and technique with rollator     High Level Balance   High Level Balance Activities Marching forwards;Marching backwards;Tandem walking  tandem walking fwd/bwd   High Level Balance Comments on floor next to counter top with HHA on opposite side to assist for balance, 2 laps each/each way with min to mod assist for balance.                                      Knee/Hip Exercises: Aerobic   Nustep with bil UE/LE's, level 3 x 8 minutes with goal >/= 35 steps per minute for strengthening and activity tolerance                                    PT Short Term Goals - 01/25/16 1535      PT SHORT TERM GOAL #1  Title Patient demonstrates understanding of [place] with daughter cueing. (Target Date: 01/25/2016)   Baseline 01/25/16: met today   Status Achieved     PT SHORT TERM GOAL #2   Title Timed Up-Go with rollator walker <31sec with supervision. (Target Date: 01/25/2016)   Baseline 01/25/16: met with score of 24.72 sec's with rollator   Time --   Period --   Status Achieved     PT SHORT TERM GOAL #3   Title Patient and dtr verbalize understanding of Orthostatic Hypotension recomendations. (Target Date: 01/25/2016)   Baseline 01/09/16 met today   Status Achieved     PT SHORT TERM GOAL #4   Title Patient ambulates 150' with rollator walker with supervision /verbal cues. (Target Date: 01/25/2016)   Baseline 01/25/16: met today- 220 feet with rollator with supervision/occasional verbal cues needed.   Time --   Period --   Status Achieved            PT Long Term Goals - 12/26/15 2209      PT LONG TERM GOAL #1   Title Patient with daughters cueing is able to demonstrate understanding of ongoing HEP / fitness plan. (Target Date: 02/22/2016)   Time 8   Period Weeks   Status New     PT LONG TERM GOAL #2   Title [place] Balance >/= 27/56 to indicate lower fall risk. (Target Date: 02/22/2016)   Time 8   Period Weeks   Status New     PT LONG TERM GOAL #3   Title Timed Up-Go with rollator walker <28sec to indicate lower fall risk. (Target Date: 02/22/2016)   Time 8   Period Weeks   Status New     PT LONG TERM GOAL #4   Title Patient and daughter verbalize understanding of fall prevention strategies including orthostatic hypotension recommendations. (Target Date: 02/22/2016)   Time 8   Period Weeks   Status New     PT LONG TERM GOAL #5   Title Patient ambulates 250' with rollator walker with supervision. (Target Date: 02/22/2016)   Time 8   Period Weeks   Status New     Additional Long Term Goals   Additional Long Term Goals Yes     PT LONG TERM GOAL #6   Title Patient negotiates ramps & curbs with rollator walker with minA from dtr safely. (Target Date: 02/22/2016)   Time 8   Period Weeks   Status New           Plan - 01/30/16 1537    Clinical Impression Statement today's skilled session continued to address gait, balance and LE strengthening working towards pt's LTGs. Pt continues to demo improved activity tolearance with increased gait distances. Pt does continue to be challenged with balance activites on complaint surfaces. Pt is progressing toward goals and should benefit from continued PT to progress toward unmet goals .   Rehab Potential Good   PT Frequency 2x / week   PT Duration 8 weeks   PT Treatment/Interventions ADLs/Self Care Home Management;DME Instruction;Gait training;Stair training;Functional mobility training;Therapeutic activities;Therapeutic exercise;Balance training;Neuromuscular  re-education;Patient/family education   PT Next Visit Plan fall prevention strategies, work on gait with rollator including barriers, balance in single leg stance/complaint surfaces with UE support as needed   Consulted and Agree with Plan of Care Patient;Family member/caregiver   Family Member Consulted dtr      Patient will benefit from skilled therapeutic intervention in order to improve the following deficits and impairments:  Abnormal  gait, Decreased activity tolerance, Decreased cognition, Decreased balance, Decreased knowledge of use of DME, Decreased mobility, Decreased range of motion, Dizziness, Decreased strength, Postural dysfunction, Obesity, Pain  Visit Diagnosis: Dizziness and giddiness  Other abnormalities of gait and mobility  Muscle weakness (generalized)  Unsteadiness on feet     Problem List Patient Active Problem List   Diagnosis Date Noted  . Memory loss 10/22/2012    Willow Ora, PTA, [facility] [address] [place], [place] [address] [phone] 01/31/16, 10:10 PM   Name: Brynnlee Cumpian MRN: [id number] Date of Birth: 1924-07-18

## 2016-02-01 ENCOUNTER — Encounter: Payer: Self-pay | Admitting: Physical Therapy

## 2016-02-01 ENCOUNTER — Ambulatory Visit: Payer: Medicare Other | Admitting: Physical Therapy

## 2016-02-01 DIAGNOSIS — M6281 Muscle weakness (generalized): Secondary | ICD-10-CM

## 2016-02-01 DIAGNOSIS — R2689 Other abnormalities of gait and mobility: Secondary | ICD-10-CM | POA: Diagnosis not present

## 2016-02-01 DIAGNOSIS — R42 Dizziness and giddiness: Secondary | ICD-10-CM

## 2016-02-01 DIAGNOSIS — R2681 Unsteadiness on feet: Secondary | ICD-10-CM

## 2016-02-02 NOTE — Therapy (Signed)
[facility] [address] [place], [place], [address] Phone: [phone]   Fax:  [phone]  Physical Therapy Treatment  Patient Details  Name: Candice Miller MRN: [id number] Date of Birth: November 01, 1924 Referring Provider: Melinda Crutch, MD  Encounter Date: 02/01/2016      PT End of Session - 02/01/16 1538    Visit Number 9   Number of Visits 17   Date for PT Re-Evaluation 02/22/16   Authorization Type Medicare G-code & progress note   PT Start Time 1532   PT Stop Time 1615   PT Time Calculation (min) 43 min   Equipment Utilized During Treatment Gait belt   Activity Tolerance Patient tolerated treatment well;Patient limited by fatigue   Behavior During Therapy [facility] for tasks assessed/performed      History reviewed. No pertinent past medical history.  History reviewed. No pertinent surgical history.  There were no vitals filed for this visit.      Subjective Assessment - 02/01/16 1537    Subjective No new complaints. No falls or pain to report.    Patient is accompained by: Family member  son in law in lobby   Pertinent History dementia, hard of hearing, COPD, HTN, OA.osteoporosis   Limitations Walking;House hold activities;Standing   Patient Stated Goals "I want to get to where I can walk."   Currently in Pain? No/denies   Multiple Pain Sites No           [facility] Adult PT Treatment/Exercise - 02/01/16 1538      Transfers   Transfers Sit to Stand;Stand to Sit   Sit to Stand 5: Supervision;With upper extremity assist;With armrests;From chair/3-in-1   Sit to Stand Details Verbal cues for sequencing;Verbal cues for technique;Verbal cues for precautions/safety;Verbal cues for safe use of DME/AE   Sit to Stand Details (indicate cue type and reason) cues for hand placement for safety and for anterior weight shifting to assist with powering up into standing   Stand to Sit 5: Supervision;With upper extremity  assist;To chair/3-in-1;With armrests   Stand to Sit Details (indicate cue type and reason) Verbal cues for sequencing;Verbal cues for technique;Verbal cues for precautions/safety;Verbal cues for safe use of DME/AE   Stand to Sit Details cues to reach back with sitting down     Ambulation/Gait   Ambulation/Gait Yes   Ambulation/Gait Assistance 5: Supervision   Ambulation/Gait Assistance Details occasional cues needed for increased foot clearance with gait due to shuffling, vc x1 for brake use on one decline outside   Ambulation Distance (Feet) 500 Feet  x1   Assistive device Rollator   Gait Pattern Step-through pattern;Decreased stride length;Shuffle   Ambulation Surface Level;Unlevel;Indoor;Outdoor;Paved     High Level Balance   High Level Balance Activities Side stepping;Marching forwards;Marching backwards;Tandem walking  tandem fwd/bwd   High Level Balance Comments on red mats next to counter top: with one hand on counter and HHA opposite side, 3 laps each/each way with cues on form, posture and weight shifting.      Knee/Hip Exercises: Aerobic   Nustep with bil UE/LE's, level 3 x 8 minutes with goal >/= 35 steps per minute for strengthening and activity tolerance                                   PT Short Term Goals - 01/25/16 1535      PT SHORT TERM GOAL #1   Title Patient  demonstrates understanding of [place] with daughter cueing. (Target Date: 01/25/2016)   Baseline 01/25/16: met today   Status Achieved     PT SHORT TERM GOAL #2   Title Timed Up-Go with rollator walker <31sec with supervision. (Target Date: 01/25/2016)   Baseline 01/25/16: met with score of 24.72 sec's with rollator   Time --   Period --   Status Achieved     PT SHORT TERM GOAL #3   Title Patient and dtr verbalize understanding of Orthostatic Hypotension recomendations. (Target Date: 01/25/2016)   Baseline 01/09/16 met today   Status Achieved     PT SHORT TERM GOAL #4   Title Patient ambulates  150' with rollator walker with supervision /verbal cues. (Target Date: 01/25/2016)   Baseline 01/25/16: met today- 220 feet with rollator with supervision/occasional verbal cues needed.   Time --   Period --   Status Achieved           PT Long Term Goals - 12/26/15 2209      PT LONG TERM GOAL #1   Title Patient with daughters cueing is able to demonstrate understanding of ongoing HEP / fitness plan. (Target Date: 02/22/2016)   Time 8   Period Weeks   Status New     PT LONG TERM GOAL #2   Title [place] Balance >/= 27/56 to indicate lower fall risk. (Target Date: 02/22/2016)   Time 8   Period Weeks   Status New     PT LONG TERM GOAL #3   Title Timed Up-Go with rollator walker <28sec to indicate lower fall risk. (Target Date: 02/22/2016)   Time 8   Period Weeks   Status New     PT LONG TERM GOAL #4   Title Patient and daughter verbalize understanding of fall prevention strategies including orthostatic hypotension recommendations. (Target Date: 02/22/2016)   Time 8   Period Weeks   Status New     PT LONG TERM GOAL #5   Title Patient ambulates 250' with rollator walker with supervision. (Target Date: 02/22/2016)   Time 8   Period Weeks   Status New     Additional Long Term Goals   Additional Long Term Goals Yes     PT LONG TERM GOAL #6   Title Patient negotiates ramps & curbs with rollator walker with minA from dtr safely. (Target Date: 02/22/2016)   Time 8   Period Weeks   Status New               Plan - 02/01/16 1538    Clinical Impression Statement Today's skilled session continued to focus on activity tolerance, strengthening and balance activities. Pt is making good progress toward goals and should benefit from continued PT to progress toward unmet goals .   Rehab Potential Good   PT Frequency 2x / week   PT Duration 8 weeks   PT Treatment/Interventions ADLs/Self Care Home Management;DME Instruction;Gait training;Stair training;Functional mobility  training;Therapeutic activities;Therapeutic exercise;Balance training;Neuromuscular re-education;Patient/family education   PT Next Visit Plan g-code next visit; fall prevention strategies, work on gait with rollator including barriers, balance in single leg stance/complaint surfaces with UE support as needed   Consulted and Agree with Plan of Care Patient;Family member/caregiver   Family Member Consulted dtr      Patient will benefit from skilled therapeutic intervention in order to improve the following deficits and impairments:  Abnormal gait, Decreased activity tolerance, Decreased cognition, Decreased balance, Decreased knowledge of use of DME, Decreased mobility, Decreased range  of motion, Dizziness, Decreased strength, Postural dysfunction, Obesity, Pain  Visit Diagnosis: Dizziness and giddiness  Other abnormalities of gait and mobility  Muscle weakness (generalized)  Unsteadiness on feet     Problem List Patient Active Problem List   Diagnosis Date Noted  . Memory loss 10/22/2012    Willow Ora, PTA, [facility] [address] [place], [place] [address] [phone] 02/02/16, 7:52 PM   Name: Candice Miller MRN: [id number] Date of Birth: March 25, 1925

## 2016-02-06 ENCOUNTER — Ambulatory Visit: Payer: Medicare Other | Admitting: Physical Therapy

## 2016-02-06 DIAGNOSIS — R42 Dizziness and giddiness: Secondary | ICD-10-CM | POA: Diagnosis not present

## 2016-02-06 DIAGNOSIS — R2689 Other abnormalities of gait and mobility: Secondary | ICD-10-CM | POA: Diagnosis not present

## 2016-02-06 DIAGNOSIS — M6281 Muscle weakness (generalized): Secondary | ICD-10-CM

## 2016-02-06 DIAGNOSIS — R2681 Unsteadiness on feet: Secondary | ICD-10-CM | POA: Diagnosis not present

## 2016-02-06 NOTE — Therapy (Signed)
[facility] [address] [place], [place], [address] Phone: [phone]   Fax:  [phone]  Physical Therapy Treatment  Patient Details  Name: Candice Miller MRN: [id number] Date of Birth: Aug 15, 1924 Referring Provider: Melinda Crutch, MD  Encounter Date: 02/06/2016      PT End of Session - 02/06/16 1638    Visit Number 10   Number of Visits 17   Date for PT Re-Evaluation 02/22/16   Authorization Type Medicare G-code & progress note   PT Start Time 1537   PT Stop Time 1617   PT Time Calculation (min) 40 min   Equipment Utilized During Treatment Gait belt   Activity Tolerance Patient tolerated treatment well;Patient limited by fatigue   Behavior During Therapy [facility] for tasks assessed/performed      No past medical history on file.  No past surgical history on file.  There were no vitals filed for this visit.      Subjective Assessment - 02/06/16 1541    Subjective Patient stated she wanted to be able to walk with out her walker, but couldn't because she felt unsteady. No falls or pain to report.    Pertinent History dementia, hard of hearing, COPD, HTN, OA.osteoporosis   Limitations Walking;House hold activities;Standing   Patient Stated Goals "I want to get to where I can walk."   Currently in Pain? No/denies   Multiple Pain Sites No           [facility] Adult PT Treatment/Exercise - 02/06/16 1545      Transfers   Transfers Sit to Stand;Stand to Sit   Sit to Stand 6: Modified independent (Device/Increase time)   Stand to Sit 6: Modified independent (Device/Increase time)     Ambulation/Gait   Ambulation/Gait Yes   Ambulation/Gait Assistance 5: Supervision   Ambulation/Gait Assistance Details Verbal and tactile cues for proper posture. Patient occasionaly marches during ambulation to relieve knee pain.   Ambulation Distance (Feet) 300 Feet   Assistive device Rollator   Gait Pattern Step-through  pattern;Decreased stride length;Shuffle   Ambulation Surface Level;Indoor     Standardized Balance Assessment   Standardized Balance Assessment Timed Up and Go Test     Timed Up and Go Test   TUG Normal TUG   Normal TUG (seconds) 25.28     High Level Balance   High Level Balance Activities Tandem walking;Marching forwards   High Level Balance Comments on level surface with one UE on counter for support. Verbal and tactile cues required for correct posture. Two rest breaks.           Balance Exercises - 02/06/16 1628      Balance Exercises: Standing   Standing Eyes Opened Narrow base of support (BOS);Wide (BOA);Head turns;Foam/compliant surface;Solid surface;30 secs;Limitations   Standing Eyes Closed Wide (BOA);Foam/compliant surface;30 secs;Limitations;3 reps     Balance Exercises: Standing   Standing Eyes Opened Limitations On compliant surface with wide base of support: head movements left<>right and up<>down x 10 reps each with min to mod assist for balance, no UE support. Patient required vc to increase head movement. Compliant surface removed to encourage full head movements, but did not provide enough challange for patient. One rest break.   Standing Eyes Closed Limitations On compliant surface for 30 seconds x3 with wide base of suppord. No UE assist. min guard for patient safty and balance.           PT Short Term Goals - 01/25/16 1535  PT SHORT TERM GOAL #1   Title Patient demonstrates understanding of [place] with daughter cueing. (Target Date: 01/25/2016)   Baseline 01/25/16: met today   Status Achieved     PT SHORT TERM GOAL #2   Title Timed Up-Go with rollator walker <31sec with supervision. (Target Date: 01/25/2016)   Baseline 01/25/16: met with score of 24.72 sec's with rollator   Time --   Period --   Status Achieved     PT SHORT TERM GOAL #3   Title Patient and dtr verbalize understanding of Orthostatic Hypotension recomendations. (Target Date:  01/25/2016)   Baseline 01/09/16 met today   Status Achieved     PT SHORT TERM GOAL #4   Title Patient ambulates 150' with rollator walker with supervision /verbal cues. (Target Date: 01/25/2016)   Baseline 01/25/16: met today- 220 feet with rollator with supervision/occasional verbal cues needed.   Time --   Period --   Status Achieved           PT Long Term Goals - 12/26/15 2209      PT LONG TERM GOAL #1   Title Patient with daughters cueing is able to demonstrate understanding of ongoing HEP / fitness plan. (Target Date: 02/22/2016)   Time 8   Period Weeks   Status New     PT LONG TERM GOAL #2   Title Berg Balance >/= 27/56 to indicate lower fall risk. (Target Date: 02/22/2016)   Time 8   Period Weeks   Status New     PT LONG TERM GOAL #3   Title Timed Up-Go with rollator walker <28sec to indicate lower fall risk. (Target Date: 02/22/2016)   Time 8   Period Weeks   Status New     PT LONG TERM GOAL #4   Title Patient and daughter verbalize understanding of fall prevention strategies including orthostatic hypotension recommendations. (Target Date: 02/22/2016)   Time 8   Period Weeks   Status New     PT LONG TERM GOAL #5   Title Patient ambulates 250' with rollator walker with supervision. (Target Date: 02/22/2016)   Time 8   Period Weeks   Status New     Additional Long Term Goals   Additional Long Term Goals Yes     PT LONG TERM GOAL #6   Title Patient negotiates ramps & curbs with rollator walker with minA from dtr safely. (Target Date: 02/22/2016)   Time 8   Period Weeks   Status New           Plan - 02/06/16 1639    Clinical Impression Statement Todays skilled session continued to focus on activity tolrance, and balance activities. Patient is limited by fatigue but is making good progress towards goals and should benefit from continued PT to progress towards unmet goals   Rehab Potential Good   PT Frequency 2x / week   PT Duration 8 weeks   PT  Treatment/Interventions ADLs/Self Care Home Management;DME Instruction;Gait training;Stair training;Functional mobility training;Therapeutic activities;Therapeutic exercise;Balance training;Neuromuscular re-education;Patient/family education   PT Next Visit Plan Fall prevention strategies, work on gait with rollator including barriers, balance in single leg stance/complaint surfaces with UE support as needed   Consulted and Agree with Plan of Care Patient      Patient will benefit from skilled therapeutic intervention in order to improve the following deficits and impairments:  Abnormal gait, Decreased activity tolerance, Decreased cognition, Decreased balance, Decreased knowledge of use of DME, Decreased mobility, Decreased range of motion,  Dizziness, Decreased strength, Postural dysfunction, Obesity, Pain  Visit Diagnosis: Dizziness and giddiness  Other abnormalities of gait and mobility  Muscle weakness (generalized)  Unsteadiness on feet       G-Codes - 06-Mar-2016 1612    Functional Assessment Tool Used Timed Up-Go with rollator walker 25.28 sec with min guard.   Functional Limitation Mobility: Walking and moving around      Problem List Patient Active Problem List   Diagnosis Date Noted  . Memory loss 10/22/2012    Benjiman Core, [place] 02/07/2016, 4:13 PM  [facility] [address] [place], [place], [address] Phone: [phone]   Fax:  [phone]  Name: Candice Miller MRN: [id number] Date of Birth: 09-Jan-1925

## 2016-02-08 ENCOUNTER — Ambulatory Visit: Payer: Medicare Other | Admitting: Physical Therapy

## 2016-02-08 ENCOUNTER — Encounter: Payer: Self-pay | Admitting: Physical Therapy

## 2016-02-08 DIAGNOSIS — M6281 Muscle weakness (generalized): Secondary | ICD-10-CM | POA: Diagnosis not present

## 2016-02-08 DIAGNOSIS — R2681 Unsteadiness on feet: Secondary | ICD-10-CM

## 2016-02-08 DIAGNOSIS — R2689 Other abnormalities of gait and mobility: Secondary | ICD-10-CM | POA: Diagnosis not present

## 2016-02-08 DIAGNOSIS — R42 Dizziness and giddiness: Secondary | ICD-10-CM | POA: Diagnosis not present

## 2016-02-08 NOTE — Therapy (Signed)
[facility] [address] [place], [place], [address] Phone: [phone]   Fax:  [phone]  Physical Therapy Treatment  Patient Details  Name: Candice Miller MRN: [id number] Date of Birth: [birth year] Referring Provider: Melinda Crutch, MD  Encounter Date: 02/08/2016      PT End of Session - 02/08/16 1648    Visit Number 11   Number of Visits 17   Date for PT Re-Evaluation 02/22/16   Authorization Type Medicare G-code & progress note   PT Start Time 1532   PT Stop Time 1620   PT Time Calculation (min) 48 min   Equipment Utilized During Treatment Gait belt   Activity Tolerance Patient tolerated treatment well;Patient limited by fatigue   Behavior During Therapy [facility] for tasks assessed/performed      History reviewed. No pertinent past medical history.  History reviewed. No pertinent surgical history.  There were no vitals filed for this visit.      Subjective Assessment - 02/08/16 1535    Subjective Patient states she has soreness in her shoulder in the mornings but it is not bothering her at this stime. She needed two nebulizer treatments this morning because her COPD was acting up   Pertinent History dementia, hard of hearing, COPD, HTN, OA.osteoporosis   Limitations Walking;House hold activities;Standing   Patient Stated Goals "I want to get to where I can walk."   Currently in Pain? No/denies   Multiple Pain Sites No           [facility] Adult PT Treatment/Exercise - 02/08/16 1632      Transfers   Transfers Sit to Stand;Stand to Sit   Sit to Stand 6: Modified independent (Device/Increase time)   Sit to Stand Details Tactile cues for placement   Stand to Sit 6: Modified independent (Device/Increase time)   Stand to Sit Details (indicate cue type and reason) Tactile cues for placement     Ambulation/Gait   Ambulation/Gait Yes   Ambulation/Gait Assistance 5: Supervision   Ambulation/Gait Assistance Details  VC for correct posture   Ambulation Distance (Feet) 250 Feet   Assistive device Rollator   Gait Pattern Step-through pattern;Decreased stride length;Shuffle   Ambulation Surface Level;Indoor     High Level Balance   High Level Balance Comments Min guard toe walking & heel walking; fwd & bwd; 3 laps in parallel bars; Bil UE support. Min assist rocker board in parallel bars; anterior/posterior & lateral; 3x30 seconds; Bil UE support. Verbal and tactile cues for upright posture during balance activities.     Knee/Hip Exercises: Aerobic   Nustep with bil UE/LE's, level 2 x 8 minutes     Knee/Hip Exercises: Standing   Functional Squat 3 sets;10 reps   Functional Squat Limitations In paralle bars; Bil UE support; chair behind; VC for correct posture and technique           PT Short Term Goals - 01/25/16 1535      PT SHORT TERM GOAL #1   Title Patient demonstrates understanding of [place] with daughter cueing. (Target Date: 01/25/2016)   Baseline 01/25/16: met today   Status Achieved     PT SHORT TERM GOAL #2   Title Timed Up-Go with rollator walker <31sec with supervision. (Target Date: 01/25/2016)   Baseline 01/25/16: met with score of 24.72 sec's with rollator   Time --   Period --   Status Achieved     PT SHORT TERM GOAL #3   Title Patient and dtr  verbalize understanding of Orthostatic Hypotension recomendations. (Target Date: 01/25/2016)   Baseline 01/09/16 met today   Status Achieved     PT SHORT TERM GOAL #4   Title Patient ambulates 150' with rollator walker with supervision /verbal cues. (Target Date: 01/25/2016)   Baseline 01/25/16: met today- 220 feet with rollator with supervision/occasional verbal cues needed.   Time --   Period --   Status Achieved           PT Long Term Goals - 12/26/15 2209      PT LONG TERM GOAL #1   Title Patient with daughters cueing is able to demonstrate understanding of ongoing HEP / fitness plan. (Target Date: 02/22/2016)    Time 8   Period Weeks   Status New     PT LONG TERM GOAL #2   Title Berg Balance >/= 27/56 to indicate lower fall risk. (Target Date: 02/22/2016)   Time 8   Period Weeks   Status New     PT LONG TERM GOAL #3   Title Timed Up-Go with rollator walker <28sec to indicate lower fall risk. (Target Date: 02/22/2016)   Time 8   Period Weeks   Status New     PT LONG TERM GOAL #4   Title Patient and daughter verbalize understanding of fall prevention strategies including orthostatic hypotension recommendations. (Target Date: 02/22/2016)   Time 8   Period Weeks   Status New     PT LONG TERM GOAL #5   Title Patient ambulates 250' with rollator walker with supervision. (Target Date: 02/22/2016)   Time 8   Period Weeks   Status New     Additional Long Term Goals   Additional Long Term Goals Yes     PT LONG TERM GOAL #6   Title Patient negotiates ramps & curbs with rollator walker with minA from dtr safely. (Target Date: 02/22/2016)   Time 8   Period Weeks   Status New            Plan - 02/08/16 1649    Clinical Impression Statement Todays skilled session worked on progressing patients balance activities, activity tolerance, and LE strengthening. Patient is limited by fatigue but is making good progress towards goals and should benefit from continued PT to progress towards unmet goals   Rehab Potential Good   PT Frequency 2x / week   PT Duration 8 weeks   PT Treatment/Interventions ADLs/Self Care Home Management;DME Instruction;Gait training;Stair training;Functional mobility training;Therapeutic activities;Therapeutic exercise;Balance training;Neuromuscular re-education;Patient/family education   PT Next Visit Plan Fall prevention strategies, work on gait with rollator including barriers, balance in single leg stance/complaint surfaces with UE support as needed   Consulted and Agree with Plan of Care Patient      Patient will benefit from skilled therapeutic intervention in  order to improve the following deficits and impairments:  Abnormal gait, Decreased activity tolerance, Decreased cognition, Decreased balance, Decreased knowledge of use of DME, Decreased mobility, Decreased range of motion, Dizziness, Decreased strength, Postural dysfunction, Obesity, Pain  Visit Diagnosis: Dizziness and giddiness  Other abnormalities of gait and mobility  Muscle weakness (generalized)  Unsteadiness on feet     Problem List Patient Active Problem List   Diagnosis Date Noted  . Memory loss 10/22/2012    Benjiman Core, [place] 02/08/2016, 4:56 PM  [facility] [address] [place], [place], [address] Phone: [phone]   Fax:  [phone]  Name: Smt. Loder MRN: [id number] Date of Birth: [birth year]

## 2016-02-13 ENCOUNTER — Ambulatory Visit: Payer: Medicare Other | Attending: Family Medicine | Admitting: Physical Therapy

## 2016-02-13 DIAGNOSIS — M6281 Muscle weakness (generalized): Secondary | ICD-10-CM | POA: Insufficient documentation

## 2016-02-13 DIAGNOSIS — R42 Dizziness and giddiness: Secondary | ICD-10-CM | POA: Diagnosis not present

## 2016-02-13 DIAGNOSIS — R2689 Other abnormalities of gait and mobility: Secondary | ICD-10-CM | POA: Diagnosis not present

## 2016-02-13 DIAGNOSIS — R2681 Unsteadiness on feet: Secondary | ICD-10-CM | POA: Diagnosis not present

## 2016-02-13 NOTE — Therapy (Signed)
[facility] [address] [place], [place], [address] Phone: [phone]   Fax:  [phone]  Physical Therapy Treatment  Patient Details  Name: Candice Miller MRN: [id number] Date of Birth: Mar 07, 1925 Referring Provider: Melinda Crutch, MD  Encounter Date: 02/13/2016      PT End of Session - 02/13/16 1633    Visit Number 12   Number of Visits 17   Date for PT Re-Evaluation 02/22/16   Authorization Type Medicare G-code & progress note   PT Start Time 1540   PT Stop Time 1620   PT Time Calculation (min) 40 min   Equipment Utilized During Treatment Gait belt   Activity Tolerance Patient tolerated treatment well;Patient limited by fatigue   Behavior During Therapy [facility] for tasks assessed/performed      No past medical history on file.  No past surgical history on file.  There were no vitals filed for this visit.      Subjective Assessment - 02/13/16 1542    Subjective Pt reports no falls, no pain, and no significant changes.  "It (physical therapy) is going good I think. It's making a difference. I can get around better."   Patient is accompained by: --  no family present today   Pertinent History dementia, hard of hearing, COPD, HTN, OA.osteoporosis   Limitations Walking;House hold activities;Standing   Patient Stated Goals "I want to get to where I can walk."   Currently in Pain? No/denies            [facility] PT Assessment - 02/13/16 0001      Berg Balance Test   Sit to Stand Able to stand  independently using hands   Standing Unsupported Able to stand 2 minutes with supervision   Sitting with Back Unsupported but Feet Supported on Floor or Stool Able to sit safely and securely 2 minutes   Stand to Sit Uses backs of legs against chair to control descent   Transfers Able to transfer safely, definite need of hands   Standing Unsupported with Eyes Closed Able to stand 10 seconds with supervision   Standing  Ubsupported with Feet Together Able to place feet together independently and stand for 1 minute with supervision   From Standing, Reach Forward with Outstretched Arm Can reach forward >12 cm safely (5")   From Standing Position, Pick up Object from Floor Able to pick up shoe, needs supervision   From Standing Position, Turn to Look Behind Over each Shoulder Turn sideways only but maintains balance   Turn 360 Degrees Needs close supervision or verbal cueing   Standing Unsupported, Alternately Place Feet on Step/Stool Able to complete >2 steps/needs minimal assist   Standing Unsupported, One Foot in Front Able to take small step independently and hold 30 seconds   Standing on One Leg Tries to lift leg/unable to hold 3 seconds but remains standing independently   Total Score 34                     [facility] Adult PT Treatment/Exercise - 02/13/16 0001      Transfers   Transfers Sit to Stand;Stand to Sit   Sit to Stand 5: Supervision   Sit to Stand Details (indicate cue type and reason) cueing for safe use of rollator during transfers with inconsistent within-session carryover   Stand to Sit 5: Supervision   Stand to Sit Details cueing for safe use of rollator brakes, safe hand placement with ineffective within-session carryover  Ambulation/Gait   Ambulation/Gait Yes   Ambulation/Gait Assistance 5: Supervision   Ambulation Distance (Feet) 180 Feet   Assistive device Rolling walker   Gait Pattern Step-through pattern;Decreased stride length;Shuffle;Wide base of support   Ambulation Surface Level;Indoor   Ramp 4: Min assist;5: Supervision   Ramp Details (indicate cue type and reason) using rollator, pt supervision to ascend, min guard to descend due to decreased control of rollator   Curb 4: Min assist   Curb Details (indicate cue type and reason) with rollator; cueing for safety, technique, use of rollator brakes     Timed Up and Go Test   TUG Normal TUG   Normal TUG (seconds)  28.25  with rollator                PT Education - 02/13/16 1559    Education provided Yes   Education Details During session, eeiterated safe use of rollator during functional transfers. Explained Berg findings, progress, functional implications.  Post-session, spoke with daughter about importance of pt having family member present during session to maximize pt carryover and progress.   Person(s) Educated Patient;Child(ren)   Methods Explanation;Demonstration   Comprehension Verbalized understanding;Need further instruction          PT Short Term Goals - 01/25/16 1535      PT SHORT TERM GOAL #1   Title Patient demonstrates understanding of [place] with daughter cueing. (Target Date: 01/25/2016)   Baseline 01/25/16: met today   Status Achieved     PT SHORT TERM GOAL #2   Title Timed Up-Go with rollator walker <31sec with supervision. (Target Date: 01/25/2016)   Baseline 01/25/16: met with score of 24.72 sec's with rollator   Time --   Period --   Status Achieved     PT SHORT TERM GOAL #3   Title Patient and dtr verbalize understanding of Orthostatic Hypotension recomendations. (Target Date: 01/25/2016)   Baseline 01/09/16 met today   Status Achieved     PT SHORT TERM GOAL #4   Title Patient ambulates 150' with rollator walker with supervision /verbal cues. (Target Date: 01/25/2016)   Baseline 01/25/16: met today- 220 feet with rollator with supervision/occasional verbal cues needed.   Time --   Period --   Status Achieved           PT Long Term Goals - 02/13/16 1606      PT LONG TERM GOAL #1   Title Patient with daughters cueing is able to demonstrate understanding of ongoing HEP / fitness plan. (Target Date: 02/22/2016)   Time 8   Period Weeks   Status On-going     PT LONG TERM GOAL #2   Title [place] >/= 27/56 to indicate lower fall risk. (Target Date: 02/22/2016)   Baseline 11/1/17Merrilee Jansky = 34/56   Time 8   Period Weeks   Status Achieved      PT LONG TERM GOAL #3   Title Timed Up-Go with rollator walker <28sec to indicate lower fall risk. (Target Date: 02/22/2016)   Baseline 11/1: TUG = 28.25 seconds with rollator   Time 8   Period Weeks   Status Partially Met     PT LONG TERM GOAL #4   Title Patient and daughter verbalize understanding of fall prevention strategies including orthostatic hypotension recommendations. (Target Date: 02/22/2016)   Time 8   Period Weeks   Status On-going     PT LONG TERM GOAL #5   Title Patient ambulates 250' with rollator walker with  supervision. (Target Date: 02/22/2016)   Time 8   Period Weeks   Status On-going     PT LONG TERM GOAL #6   Title Patient negotiates ramps & curbs with rollator walker with minA from dtr safely. (Target Date: 02/22/2016)   Time 8   Period Weeks   Status On-going               Plan - 02/13/16 1633    Clinical Impression Statement Session focused on beginning to assess LTG's to determine if pt would continue to benefit from skilled PT. Berg score has improved from 19/56 to 34/56 since beginning this episode of PT. TUG time improved to 28.25 seconds, but not to goal-level of < 28 seconds using rollator. Pt demonstrates ineffective within-session carryover of cueing provided by this PT and no caregiver or family member present for this session. Following this session, recommended to daughter that a family member accompany patient to future sessions to maximize pt carryover and progress.    Rehab Potential Good   PT Frequency 2x / week   PT Duration 8 weeks   PT Treatment/Interventions ADLs/Self Care Home Management;DME Instruction;Gait training;Stair training;Functional mobility training;Therapeutic activities;Therapeutic exercise;Balance training;Neuromuscular re-education;Patient/family education   PT Next Visit Plan Fall prevention strategies, work on gait with rollator including barriers, curb step/ramp negotiation with RW, sit <> stand from low  surfaces (daughter reports pt difficulty standing from toilet), continue to reiterate safe use of rollator   Consulted and Agree with Plan of Care Patient   Family Member Consulted dtr  daughter not present for session, but spoke wit dtr post-session in waiting room      Patient will benefit from skilled therapeutic intervention in order to improve the following deficits and impairments:  Abnormal gait, Decreased activity tolerance, Decreased cognition, Decreased balance, Decreased knowledge of use of DME, Decreased mobility, Decreased range of motion, Dizziness, Decreased strength, Postural dysfunction, Obesity, Pain  Visit Diagnosis: Other abnormalities of gait and mobility  Muscle weakness (generalized)  Unsteadiness on feet     Problem List Patient Active Problem List   Diagnosis Date Noted  . Memory loss 10/22/2012    Billie Ruddy, PT, DPT [facility] [address] [place], [place], [address] Phone: [phone]   Fax:  [phone] 02/13/16, 4:44 PM  Name: Candice Miller MRN: [id number] Date of Birth: [birth year]

## 2016-02-14 ENCOUNTER — Encounter: Payer: Self-pay | Admitting: Physical Therapy

## 2016-02-14 NOTE — Therapy (Signed)
[facility] [address] [place], [place], [address] Phone: [phone]   Fax:  [phone]  Patient Details  Name: Candice Miller MRN: [id number] Date of Birth: 27-Jul-1924 Referring Provider:  Melinda Crutch, MD  Encounter Date: 02/14/2016  Physical Therapy Progress Note  Dates of Reporting Period: 12/26/2015 to 02/06/2016  Objective Reports of Subjective Statement: Daughter reports her mother seems to be walking better.   Objective Measurements: Timed Up & Go improved from 34.37sec to 25.28sec  Goal Update:      PT Short Term Goals - 01/25/16 1535      PT SHORT TERM GOAL #1   Title Patient demonstrates understanding of [place] with daughter cueing. (Target Date: 01/25/2016)   Baseline 01/25/16: met today   Status Achieved     PT SHORT TERM GOAL #2   Title Timed Up-Go with rollator walker <31sec with supervision. (Target Date: 01/25/2016)   Baseline 01/25/16: met with score of 24.72 sec's with rollator   Time --   Period --   Status Achieved     PT SHORT TERM GOAL #3   Title Patient and dtr verbalize understanding of Orthostatic Hypotension recomendations. (Target Date: 01/25/2016)   Baseline 01/09/16 met today   Status Achieved     PT SHORT TERM GOAL #4   Title Patient ambulates 150' with rollator walker with supervision /verbal cues. (Target Date: 01/25/2016)   Baseline 01/25/16: met today- 220 feet with rollator with supervision/occasional verbal cues needed.   Time --   Period --   Status Achieved         PT Long Term Goals - 02/13/16 1606      PT LONG TERM GOAL #1   Title Patient with daughters cueing is able to demonstrate understanding of ongoing HEP / fitness plan. (Target Date: 02/22/2016)   Time 8   Period Weeks   Status On-going     PT LONG TERM GOAL #2   Title Oceanographer >/= 27/56 to indicate lower fall risk. (Target Date: 02/22/2016)   Baseline 11/1/17Merrilee Jansky = 34/56   Time 8   Period  Weeks   Status Achieved     PT LONG TERM GOAL #3   Title Timed Up-Go with rollator walker <28sec to indicate lower fall risk. (Target Date: 02/22/2016)   Baseline 11/1: TUG = 28.25 seconds with rollator   Time 8   Period Weeks   Status Partially Met     PT LONG TERM GOAL #4   Title Patient and daughter verbalize understanding of fall prevention strategies including orthostatic hypotension recommendations. (Target Date: 02/22/2016)   Time 8   Period Weeks   Status On-going     PT LONG TERM GOAL #5   Title Patient ambulates 250' with rollator walker with supervision. (Target Date: 02/22/2016)   Time 8   Period Weeks   Status On-going     PT LONG TERM GOAL #6   Title Patient negotiates ramps & curbs with rollator walker with minA from dtr safely. (Target Date: 02/22/2016)   Time 8   Period Weeks   Status On-going     Plan: continue established plan  Reason Skilled Services are Required: Patient still has high fall risk.   Candice Miller PT, DPT 02/14/2016, 11:51 AM  [facility] [address] [place], [place], [address] Phone: [phone]   Fax:  [phone]

## 2016-02-15 ENCOUNTER — Ambulatory Visit: Payer: Medicare Other | Admitting: Physical Therapy

## 2016-02-15 ENCOUNTER — Encounter: Payer: Self-pay | Admitting: Physical Therapy

## 2016-02-15 DIAGNOSIS — R42 Dizziness and giddiness: Secondary | ICD-10-CM

## 2016-02-15 DIAGNOSIS — R2681 Unsteadiness on feet: Secondary | ICD-10-CM

## 2016-02-15 DIAGNOSIS — R2689 Other abnormalities of gait and mobility: Secondary | ICD-10-CM

## 2016-02-15 DIAGNOSIS — M6281 Muscle weakness (generalized): Secondary | ICD-10-CM | POA: Diagnosis not present

## 2016-02-15 NOTE — Therapy (Signed)
[facility] [address] [place], [place], [address] Phone: [phone]   Fax:  [phone]  Physical Therapy Treatment  Patient Details  Name: Candice Miller MRN: [id number] Date of Birth: [birth year] Referring Provider: Melinda Crutch, MD  Encounter Date: 02/15/2016      PT End of Session - 02/15/16 2633    Visit Number 13   Number of Visits 17   Date for PT Re-Evaluation 02/22/16   Authorization Type Medicare G-code & progress note   PT Start Time 1532   PT Stop Time 1617   PT Time Calculation (min) 45 min   Equipment Utilized During Treatment Gait belt   Activity Tolerance Patient tolerated treatment well;Patient limited by fatigue   Behavior During Therapy [facility] for tasks assessed/performed      History reviewed. No pertinent past medical history.  History reviewed. No pertinent surgical history.  There were no vitals filed for this visit.      Subjective Assessment - 02/15/16 1535    Subjective Pt reports no falls. She awoke with a cramp in her R calf but it is better now.   Pertinent History dementia, hard of hearing, COPD, HTN, OA.osteoporosis   Limitations Walking;House hold activities;Standing   Patient Stated Goals "I want to walk with out my walker"   Currently in Pain? No/denies           [facility] Adult PT Treatment/Exercise - 02/15/16 1627      Transfers   Transfers Sit to Stand;Stand to Sit   Sit to Stand 5: Supervision;3: Mod assist   Sit to Stand Details Tactile cues for initiation;Tactile cues for sequencing;Tactile cues for posture;Verbal cues for sequencing;Verbal cues for technique   Sit to Stand Details (indicate cue type and reason) 10x sit <> stand from mat table; Supervision; VC for proper technique; No UE support. 10x sit <> stand from simulated couch; Mod assist to facilitate pt into standing due to LE weakness and pt safety; Therapist with arm around hips and hand at shoulder girdle to  facilitate postural control and pelvic tilt; One UE support. Two seated rest breaks due to pt fatigue.   Stand to Sit 5: Supervision;3: Mod assist   Stand to Sit Details (indicate cue type and reason) Tactile cues for initiation;Tactile cues for sequencing;Tactile cues for posture;Verbal cues for sequencing;Verbal cues for technique     Ambulation/Gait   Ambulation/Gait Yes   Ambulation/Gait Assistance 5: Supervision;4: Min guard   Ambulation/Gait Assistance Details Pt demonstrating good gait patten, but decreased gait speed. Relies heavily on UE support; VC required for postural control. Several seated rest breaks due to pt fatigue.   Ambulation Distance (Feet) 250 Feet   Assistive device Rollator   Gait Pattern Step-through pattern;Decreased arm swing - right;Decreased arm swing - left;Trunk flexed   Ambulation Surface Level;Indoor   Ramp 4: Min assist   Ramp Details (indicate cue type and reason) VC and min physical assist to ascend and descend curbs with safety and proper technique.   Curb 4: Min assist   Curb Details (indicate cue type and reason) VC and min physical assist required for pt to safely ascend and descend curb.           PT Short Term Goals - 01/25/16 1535      PT SHORT TERM GOAL #1   Title Patient demonstrates understanding of [place] with daughter cueing. (Target Date: 01/25/2016)   Baseline 01/25/16: met today   Status Achieved  PT SHORT TERM GOAL #2   Title Timed Up-Go with rollator walker <31sec with supervision. (Target Date: 01/25/2016)   Baseline 01/25/16: met with score of 24.72 sec's with rollator   Time --   Period --   Status Achieved     PT SHORT TERM GOAL #3   Title Patient and dtr verbalize understanding of Orthostatic Hypotension recomendations. (Target Date: 01/25/2016)   Baseline 01/09/16 met today   Status Achieved     PT SHORT TERM GOAL #4   Title Patient ambulates 150' with rollator walker with supervision /verbal cues. (Target  Date: 01/25/2016)   Baseline 01/25/16: met today- 220 feet with rollator with supervision/occasional verbal cues needed.   Time --   Period --   Status Achieved           PT Long Term Goals - 02/13/16 1606      PT LONG TERM GOAL #1   Title Patient with daughters cueing is able to demonstrate understanding of ongoing HEP / fitness plan. (Target Date: 02/22/2016)   Time 8   Period Weeks   Status On-going     PT LONG TERM GOAL #2   Title Oceanographer >/= 27/56 to indicate lower fall risk. (Target Date: 02/22/2016)   Baseline 11/1/17Merrilee Jansky = 34/56   Time 8   Period Weeks   Status Achieved     PT LONG TERM GOAL #3   Title Timed Up-Go with rollator walker <28sec to indicate lower fall risk. (Target Date: 02/22/2016)   Baseline 11/1: TUG = 28.25 seconds with rollator   Time 8   Period Weeks   Status Partially Met     PT LONG TERM GOAL #4   Title Patient and daughter verbalize understanding of fall prevention strategies including orthostatic hypotension recommendations. (Target Date: 02/22/2016)   Time 8   Period Weeks   Status On-going     PT LONG TERM GOAL #5   Title Patient ambulates 250' with rollator walker with supervision. (Target Date: 02/22/2016)   Time 8   Period Weeks   Status On-going     PT LONG TERM GOAL #6   Title Patient negotiates ramps & curbs with rollator walker with minA from dtr safely. (Target Date: 02/22/2016)   Time 8   Period Weeks   Status On-going           Plan - 02/15/16 1648    Clinical Impression Statement Todays skilled session focused on LE strengthening with sit<>stand to assist pt in ADLs (standing from camode or couch). Pt's gait pattern is showing improvement but would still benefit from continued PT to increase gait speed to that of a community ambulator.   Rehab Potential Good   PT Frequency 2x / week   PT Duration 8 weeks   PT Treatment/Interventions ADLs/Self Care Home Management;DME Instruction;Gait training;Stair  training;Functional mobility training;Therapeutic activities;Therapeutic exercise;Balance training;Neuromuscular re-education;Patient/family education   PT Next Visit Plan Fall prevention strategies, work on gait with rollator including barriers, curb step/ramp negotiation with RW, sit <> stand from low surfaces (daughter reports pt difficulty standing from toilet but pt disagrees), continue to reiterate safe use of rollator   Consulted and Agree with Plan of Care Patient   Family Member Consulted dtr  daughter not present for session, but spoke wit dtr post-session in waiting room      Patient will benefit from skilled therapeutic intervention in order to improve the following deficits and impairments:  Abnormal gait, Decreased activity tolerance, Decreased cognition, Decreased  balance, Decreased knowledge of use of DME, Decreased mobility, Decreased range of motion, Dizziness, Decreased strength, Postural dysfunction, Obesity, Pain  Visit Diagnosis: Other abnormalities of gait and mobility  Muscle weakness (generalized)  Unsteadiness on feet  Dizziness and giddiness     Problem List Patient Active Problem List   Diagnosis Date Noted  . Memory loss 10/22/2012    Benjiman Core, [place] 02/15/2016, 4:54 PM  [facility] [address] [place], [place], [address] Phone: [phone]   Fax:  [phone]  Name: Candice Miller MRN: [id number] Date of Birth: Aug 09, 1924

## 2016-02-20 ENCOUNTER — Ambulatory Visit: Payer: Medicare Other | Admitting: Physical Therapy

## 2016-02-20 ENCOUNTER — Encounter: Payer: Self-pay | Admitting: Physical Therapy

## 2016-02-20 DIAGNOSIS — R42 Dizziness and giddiness: Secondary | ICD-10-CM

## 2016-02-20 DIAGNOSIS — R2689 Other abnormalities of gait and mobility: Secondary | ICD-10-CM | POA: Diagnosis not present

## 2016-02-20 DIAGNOSIS — R2681 Unsteadiness on feet: Secondary | ICD-10-CM

## 2016-02-20 DIAGNOSIS — M6281 Muscle weakness (generalized): Secondary | ICD-10-CM

## 2016-02-20 NOTE — Therapy (Signed)
[facility] [address] [place], [place], [address] Phone: [phone]   Fax:  [phone]  Physical Therapy Treatment  Patient Details  Name: Candice Miller MRN: [id number] Date of Birth: 06-23-1924 Referring Provider: Melinda Crutch, MD  Encounter Date: 02/20/2016      PT End of Session - 02/20/16 1526    Visit Number 14   Number of Visits 17   Date for PT Re-Evaluation 02/22/16   Authorization Type Medicare G-code & progress note   PT Start Time 1318   PT Stop Time 1400   PT Time Calculation (min) 42 min   Equipment Utilized During Treatment Gait belt   Activity Tolerance Patient tolerated treatment well;Patient limited by fatigue   Behavior During Therapy [facility] for tasks assessed/performed      History reviewed. No pertinent past medical history.  History reviewed. No pertinent surgical history.  There were no vitals filed for this visit.      Subjective Assessment - 02/20/16 1319    Subjective Pt arrived with daughter today. Daughter states the patient is not performing HEP and needs frequent cueing for sit<>stand for safety.   Patient is accompained by: Family member   Pertinent History dementia, hard of hearing, COPD, HTN, OA.osteoporosis   Limitations Walking;House hold activities;Standing   Patient Stated Goals "I want to walk with out my walker"   Currently in Pain? No/denies   Multiple Pain Sites No           [facility] Adult PT Treatment/Exercise - 02/20/16 1516      Ambulation/Gait   Ambulation/Gait Yes   Ambulation/Gait Assistance 5: Supervision   Ambulation/Gait Assistance Details Patient recognized and corrected flexed posture multiple times during ambulation. She is demonstrating proper gait pattern; however gait speed remains decreased.   Ambulation Distance (Feet) 335 Feet  inclueing ramp and curb, no rest break   Assistive device Rollator   Gait Pattern Step-through pattern;Decreased arm  swing - right;Decreased arm swing - left;Trunk flexed   Ambulation Surface Level;Indoor   Ramp 4: Min assist  min guard   Ramp Details (indicate cue type and reason) VC for proper technique and sequencing with rollator   Curb 4: Min assist  min guard   Curb Details (indicate cue type and reason) VC for proper technique and sequencing with rollator   Gait Comments Educated daughter on correct technique for ramps and curbs uring rollator so she can cue patient when ambulating out of the house.      High Level Balance   High Level Balance Activities Marching forwards;Marching backwards   High Level Balance Comments In parallal bars with Bil UE support; VC to for postural control and to increase step length on L & R; min guard for safety; 3 laps; patient requireing rest break after activity           PT Education - 02/20/16 1512    Education Details Reviewed HEP, fall prevention stratigies, and technique on curbs and ramps with patient and daughter. Education was provided so daughter could properly cue patient when ambulating out of the house.  Daughter reported she was a CMA and has some knowlege on proper techniques and cueing patients.   Person(s) Educated Patient;Child(ren)   Methods Explanation;Demonstration;Handout   Comprehension Verbalized understanding;Returned demonstration;Need further instruction           PT Short Term Goals - 01/25/16 1535      PT SHORT TERM GOAL #1   Title Patient demonstrates understanding of  [place] HEP with daughter cueing. (Target Date: 01/25/2016)   Baseline 01/25/16: met today   Status Achieved     PT SHORT TERM GOAL #2   Title Timed Up-Go with rollator walker <31sec with supervision. (Target Date: 01/25/2016)   Baseline 01/25/16: met with score of 24.72 sec's with rollator   Time --   Period --   Status Achieved     PT SHORT TERM GOAL #3   Title Patient and dtr verbalize understanding of Orthostatic Hypotension recomendations. (Target  Date: 01/25/2016)   Baseline 01/09/16 met today   Status Achieved     PT SHORT TERM GOAL #4   Title Patient ambulates 150' with rollator walker with supervision /verbal cues. (Target Date: 01/25/2016)   Baseline 01/25/16: met today- 220 feet with rollator with supervision/occasional verbal cues needed.   Time --   Period --   Status Achieved           PT Long Term Goals - 02/20/16 1320      PT LONG TERM GOAL #1   Title Patient with daughters cueing is able to demonstrate understanding of ongoing HEP / fitness plan. (Target Date: 02/22/2016)   Baseline 02/20/16 Daughter present and verbalized understanding of HEP; Reports pt is not performing.   Time 8   Period Weeks   Status Achieved     PT LONG TERM GOAL #2   Title [place] Balance >/= 27/56 to indicate lower fall risk. (Target Date: 02/22/2016)   Baseline 11/1/17Sharlene Motts = 34/56   Time 8   Period Weeks   Status Achieved     PT LONG TERM GOAL #3   Title Timed Up-Go with rollator walker <28sec to indicate lower fall risk. (Target Date: 02/22/2016)   Baseline 11/1: TUG = 28.25 seconds with rollator   Time 8   Period Weeks   Status Partially Met     PT LONG TERM GOAL #4   Title Patient and daughter verbalize understanding of fall prevention strategies including orthostatic hypotension recommendations. (Target Date: 02/22/2016)   Baseline 02/20/16 Reviewed fall prevention stratigies with pt and daughter.   Time 8   Period Weeks   Status Achieved     PT LONG TERM GOAL #5   Title Patient ambulates 250' with rollator walker with supervision. (Target Date: 02/22/2016)   Baseline 02/20/16 Pt ambulated 325 ft with rollator, including ramp and curb   Time 8   Period Weeks   Status Achieved     PT LONG TERM GOAL #6   Title Patient negotiates ramps & curbs with rollator walker with minA from dtr safely. (Target Date: 02/22/2016)   Baseline 02/20/2016 Patient negotiated ramps and curb with rollator; Min guard; VC for technique and  sequencing.   Time 8   Period Weeks   Status Achieved           Plan - 02/20/16 1526    Clinical Impression Statement Todays skilled session focused on patient and family education to prepare for discharge next week and complete long term goals. Daughter has experiance as a CMA and seems comfortable providing VC for patient during sit<>stand activities. She was instructed on proper techniqe for ramps and curbs so she can better assist patient.    Rehab Potential Good   PT Frequency 2x / week   PT Duration 8 weeks   PT Treatment/Interventions ADLs/Self Care Home Management;DME Instruction;Gait training;Stair training;Functional mobility training;Therapeutic activities;Therapeutic exercise;Balance training;Neuromuscular re-education;Patient/family education   PT Next Visit Plan Educate son-in-law on technique for  ramp and curbs with rollator, so he can properly cue patient. Work on gait with rollator, including barriers; sit<>stand without UE support; high level balance activities.   Consulted and Agree with Plan of Care Patient;Family member/caregiver   Family Member Consulted Daughter      Patient will benefit from skilled therapeutic intervention in order to improve the following deficits and impairments:  Abnormal gait, Decreased activity tolerance, Decreased cognition, Decreased balance, Decreased knowledge of use of DME, Decreased mobility, Decreased range of motion, Dizziness, Decreased strength, Postural dysfunction, Obesity, Pain  Visit Diagnosis: Other abnormalities of gait and mobility  Muscle weakness (generalized)  Unsteadiness on feet  Dizziness and giddiness     Problem List Patient Active Problem List   Diagnosis Date Noted  . Memory loss 10/22/2012    Benjiman Core, [place] 02/20/2016, 3:45 PM  [facility] [address] [place], [place], [address] Phone: [phone]   Fax:  [phone]  Name:  Toniesha Zellner MRN: [id number] Date of Birth: [birth year]

## 2016-02-20 NOTE — Patient Instructions (Signed)
Fall Prevention in the Home   Falls can cause injuries. They can happen to people of all ages. There are many things you can do to make your home safe and to help prevent falls.   WHAT CAN I DO ON THE OUTSIDE OF MY HOME?  · Regularly fix the edges of walkways and driveways and fix any cracks.  · Remove anything that might make you trip as you walk through a door, such as a raised step or threshold.  · Trim any bushes or trees on the path to your home.  · Use bright outdoor lighting.  · Clear any walking paths of anything that might make someone trip, such as rocks or tools.  · Regularly check to see if handrails are loose or broken. Make sure that both sides of any steps have handrails.  · Any raised decks and porches should have guardrails on the edges.  · Have any leaves, snow, or ice cleared regularly.  · Use sand or salt on walking paths during winter.  · Clean up any spills in your garage right away. This includes oil or grease spills.  WHAT CAN I DO IN THE BATHROOM?   · Use night lights.  · Install grab bars by the toilet and in the tub and shower. Do not use towel bars as grab bars.  · Use non-skid mats or decals in the tub or shower.  · If you need to sit down in the shower, use a plastic, non-slip stool.  · Keep the floor dry. Clean up any water that spills on the floor as soon as it happens.  · Remove soap buildup in the tub or shower regularly.  · Attach bath mats securely with double-sided non-slip rug tape.  · Do not have throw rugs and other things on the floor that can make you trip.  WHAT CAN I DO IN THE BEDROOM?  · Use night lights.  · Make sure that you have a light by your bed that is easy to reach.  · Do not use any sheets or blankets that are too big for your bed. They should not hang down onto the floor.  · Have a firm chair that has side arms. You can use this for support while you get dressed.  · Do not have throw rugs and other things on the floor that can make you trip.  WHAT CAN I DO IN  THE KITCHEN?  · Clean up any spills right away.  · Avoid walking on wet floors.  · Keep items that you use a lot in easy-to-reach places.  · If you need to reach something above you, use a strong step stool that has a grab bar.  · Keep electrical cords out of the way.  · Do not use floor polish or wax that makes floors slippery. If you must use wax, use non-skid floor wax.  · Do not have throw rugs and other things on the floor that can make you trip.  WHAT CAN I DO WITH MY STAIRS?  · Do not leave any items on the stairs.  · Make sure that there are handrails on both sides of the stairs and use them. Fix handrails that are broken or loose. Make sure that handrails are as long as the stairways.  · Check any carpeting to make sure that it is firmly attached to the stairs. Fix any carpet that is loose or worn.  · Avoid having throw rugs at the top   or bottom of the stairs. If you do have throw rugs, attach them to the floor with carpet tape.  · Make sure that you have a light switch at the top of the stairs and the bottom of the stairs. If you do not have them, ask someone to add them for you.  WHAT ELSE CAN I DO TO HELP PREVENT FALLS?  · Wear shoes that:    Do not have high heels.    Have rubber bottoms.    Are comfortable and fit you well.    Are closed at the toe. Do not wear sandals.  · If you use a stepladder:    Make sure that it is fully opened. Do not climb a closed stepladder.    Make sure that both sides of the stepladder are locked into place.    Ask someone to hold it for you, if possible.  · Clearly mark and make sure that you can see:    Any grab bars or handrails.    First and last steps.    Where the edge of each step is.  · Use tools that help you move around (mobility aids) if they are needed. These include:    Canes.    Walkers.    Scooters.    Crutches.  · Turn on the lights when you go into a dark area. Replace any light bulbs as soon as they burn out.  · Set up your furniture so you have a clear  path. Avoid moving your furniture around.  · If any of your floors are uneven, fix them.  · If there are any pets around you, be aware of where they are.  · Review your medicines with your doctor. Some medicines can make you feel dizzy. This can increase your chance of falling.  Ask your doctor what other things that you can do to help prevent falls.     This information is not intended to replace advice given to you by your health care provider. Make sure you discuss any questions you have with your health care provider.     Document Released: 01/25/2009 Document Revised: 08/15/2014 Document Reviewed: 05/05/2014  [employer]vier Interactive Patient Education ©2016 [employer]

## 2016-02-22 ENCOUNTER — Encounter: Payer: Self-pay | Admitting: Physical Therapy

## 2016-02-22 ENCOUNTER — Ambulatory Visit: Payer: Medicare Other | Admitting: Physical Therapy

## 2016-02-22 DIAGNOSIS — R2681 Unsteadiness on feet: Secondary | ICD-10-CM | POA: Diagnosis not present

## 2016-02-22 DIAGNOSIS — R2689 Other abnormalities of gait and mobility: Secondary | ICD-10-CM

## 2016-02-22 DIAGNOSIS — M6281 Muscle weakness (generalized): Secondary | ICD-10-CM

## 2016-02-22 DIAGNOSIS — R42 Dizziness and giddiness: Secondary | ICD-10-CM | POA: Diagnosis not present

## 2016-02-22 NOTE — Therapy (Signed)
[facility] [address] [place], [place], [address] Phone: [phone]   Fax:  [phone]  Physical Therapy Treatment  Patient Details  Name: Candice Miller MRN: [id number] Date of Birth: June 07, 1924 Referring Provider: Melinda Crutch, MD  Encounter Date: 02/22/2016      PT End of Session - 02/22/16 1706    Visit Number 15   Number of Visits 17   Date for PT Re-Evaluation 02/22/16   Authorization Type Medicare G-code & progress note   PT Start Time 3662   PT Stop Time 1615   PT Time Calculation (min) 45 min   Equipment Utilized During Treatment Gait belt   Activity Tolerance Patient tolerated treatment well;Patient limited by fatigue   Behavior During Therapy [facility] for tasks assessed/performed      History reviewed. No pertinent past medical history.  History reviewed. No pertinent surgical history.  There were no vitals filed for this visit.      Subjective Assessment - 02/22/16 1533    Subjective No new concerns or falls to report.   Patient is accompained by: Family member   Pertinent History dementia, hard of hearing, COPD, HTN, OA.osteoporosis   Limitations Walking;House hold activities;Standing   Patient Stated Goals "I want to walk with out my walker"   Currently in Pain? No/denies           [facility] Adult PT Treatment/Exercise - 02/22/16 1606      Transfers   Transfers Sit to Stand;Stand to Sit   Sit to Stand 5: Supervision   Sit to Stand Details Verbal cues for sequencing;Verbal cues for technique   Sit to Stand Details (indicate cue type and reason) cues to scoot forward before standing.   Stand to Sit 5: Supervision   Stand to Sit Details (indicate cue type and reason) Verbal cues for sequencing;Verbal cues for technique   Stand to Sit Details Cues to lock rollator before sitting     Ambulation/Gait   Ambulation/Gait Yes   Ambulation/Gait Assistance 5: Supervision   Ambulation/Gait Assistance  Details VC for increased step length   Ambulation Distance (Feet) 115 Feet   Assistive device Rollator   Gait Pattern Step-through pattern;Decreased arm swing - right;Decreased arm swing - left;Trunk flexed   Ambulation Surface Level;Indoor     High Level Balance   High Level Balance Comments Agility ladder laid in parallel bars; pt placing one foot in between each rung of ladder to encourage increased stride length; 4 laps     Self-Care   Self-Care Other Self-Care Comments   Other Self-Care Comments  To encourage carryover of sit<>stand from clinic to home; Patient performed one lap around indoor track sitting at various mat tables and chairs as instructed; patient was given verbal feedback after each transfer to correct technique; 5 total transfers were performed     Exercises   Other Exercises  --     Knee/Hip Exercises: [place];Both;2 sets;10 reps   Long [employer] Limitations With focus on core stabilization; VC for correct technique   Marching [employer];Both;2 sets;10 reps   Marching Limitations With focus on core stabilization; VC for technique           PT Short Term Goals - 01/25/16 1535      PT SHORT TERM GOAL #1   Title Patient demonstrates understanding of [place] with daughter cueing. (Target Date: 01/25/2016)   Baseline 01/25/16: met today   Status Achieved  PT SHORT TERM GOAL #2   Title Timed Up-Go with rollator walker <31sec with supervision. (Target Date: 01/25/2016)   Baseline 01/25/16: met with score of 24.72 sec's with rollator   Time --   Period --   Status Achieved     PT SHORT TERM GOAL #3   Title Patient and dtr verbalize understanding of Orthostatic Hypotension recomendations. (Target Date: 01/25/2016)   Baseline 01/09/16 met today   Status Achieved     PT SHORT TERM GOAL #4   Title Patient ambulates 150' with rollator walker with supervision /verbal cues. (Target Date: 01/25/2016)   Baseline 01/25/16: met today- 220 feet  with rollator with supervision/occasional verbal cues needed.   Time --   Period --   Status Achieved           PT Long Term Goals - 02/20/16 1320      PT LONG TERM GOAL #1   Title Patient with daughters cueing is able to demonstrate understanding of ongoing HEP / fitness plan. (Target Date: 02/22/2016)   Baseline 02/20/16 Daughter present and verbalized understanding of HEP; Reports pt is not performing.   Time 8   Period Weeks   Status Achieved     PT LONG TERM GOAL #2   Title Berg Balance >/= 27/56 to indicate lower fall risk. (Target Date: 02/22/2016)   Baseline 11/1/17Merrilee Jansky = 34/56   Time 8   Period Weeks   Status Achieved     PT LONG TERM GOAL #3   Title Timed Up-Go with rollator walker <28sec to indicate lower fall risk. (Target Date: 02/22/2016)   Baseline 11/1: TUG = 28.25 seconds with rollator   Time 8   Period Weeks   Status Partially Met     PT LONG TERM GOAL #4   Title Patient and daughter verbalize understanding of fall prevention strategies including orthostatic hypotension recommendations. (Target Date: 02/22/2016)   Baseline 02/20/16 Reviewed fall prevention stratigies with pt and daughter.   Time 8   Period Weeks   Status Achieved     PT LONG TERM GOAL #5   Title Patient ambulates 250' with rollator walker with supervision. (Target Date: 02/22/2016)   Baseline 02/20/16 Pt ambulated 325 ft with rollator, including ramp and curb   Time 8   Period Weeks   Status Achieved     PT LONG TERM GOAL #6   Title Patient negotiates ramps & curbs with rollator walker with minA from dtr safely. (Target Date: 02/22/2016)   Baseline 02/20/2016 Patient negotiated ramps and curb with rollator; Min guard; VC for technique and sequencing.   Time 8   Period Weeks   Status Achieved           Plan - 02/22/16 1706    Clinical Impression Statement Todays skilled session focused on carryover of transfers from clinic to home with emphasis on scooting fwd before  standing and locking breaks before sitting, activities to improve gait quality, and core stabilization. Met with son-in-law in lobby after treatment and informed him pt's rollator was in need of repair on the front R wheel. Patient is making progress towards goals and should benefit from continued PT to achieve unmet goals.   Rehab Potential Good   PT Frequency 2x / week   PT Duration 8 weeks   PT Treatment/Interventions ADLs/Self Care Home Management;DME Instruction;Gait training;Stair training;Functional mobility training;Therapeutic activities;Therapeutic exercise;Balance training;Neuromuscular re-education;Patient/family education   PT Next Visit Plan Re-test TUG for LTGs. Work on gait with rollator, including  barriers; sit<>stand without UE support; high level balance activities.   Consulted and Agree with Plan of Care Patient;Family member/caregiver   Family Member Consulted Daughter      Patient will benefit from skilled therapeutic intervention in order to improve the following deficits and impairments:  Abnormal gait, Decreased activity tolerance, Decreased cognition, Decreased balance, Decreased knowledge of use of DME, Decreased mobility, Decreased range of motion, Dizziness, Decreased strength, Postural dysfunction, Obesity, Pain  Visit Diagnosis: Other abnormalities of gait and mobility  Muscle weakness (generalized)  Unsteadiness on feet  Dizziness and giddiness     Problem List Patient Active Problem List   Diagnosis Date Noted  . Memory loss 10/22/2012    Benjiman Core, [place] 02/22/2016, 5:15 PM  [facility] [address] [place], [place], [address] Phone: [phone]   Fax:  [phone]  Name: Candice Miller MRN: [id number] Date of Birth: May 16, 1924

## 2016-02-27 ENCOUNTER — Ambulatory Visit: Payer: Medicare Other | Admitting: Physical Therapy

## 2016-02-27 DIAGNOSIS — R2681 Unsteadiness on feet: Secondary | ICD-10-CM | POA: Diagnosis not present

## 2016-02-27 DIAGNOSIS — R2689 Other abnormalities of gait and mobility: Secondary | ICD-10-CM

## 2016-02-27 DIAGNOSIS — R42 Dizziness and giddiness: Secondary | ICD-10-CM | POA: Diagnosis not present

## 2016-02-27 DIAGNOSIS — M6281 Muscle weakness (generalized): Secondary | ICD-10-CM

## 2016-02-27 NOTE — Therapy (Signed)
[facility] [address] [place], [place], [address] Phone: [phone]   Fax:  [phone]  Physical Therapy Treatment  Patient Details  Name: Candice Miller MRN: [id number] Date of Birth: [birth year] Referring Provider: Melinda Crutch, MD  Encounter Date: 02/27/2016      PT End of Session - 02/27/16 1823    Visit Number 16   Number of Visits 17   Date for PT Re-Evaluation 03/23/16   Authorization Type Medicare G-code & progress note   PT Start Time 1536   PT Stop Time 1616   PT Time Calculation (min) 40 min   Activity Tolerance Patient tolerated treatment well;Patient limited by fatigue   Behavior During Therapy [facility] for tasks assessed/performed      No past medical history on file.  No past surgical history on file.  There were no vitals filed for this visit.      Subjective Assessment - 02/27/16 1811    Subjective Daughter states, "Sometimes mom stays in bed for hours during the day. She hasn't really done that exercise program. She says she has, but she hasn't." Daughter reports pt inability to perform home exercises during the day due to pt needing supervision for most exercises, and daughter working for 10 hours consecutively (although daughter does work from home). For this reason, daughter also won't be able to transport pt to the gym.   Patient is accompained by: Family member  daughter   Pertinent History dementia, hard of hearing, COPD, HTN, OA.osteoporosis   Patient Stated Goals "I want to walk with out my walker"   Currently in Pain? No/denies            [facility] PT Assessment - 02/27/16 0001      Assessment   Medical Diagnosis Falls, Mini strokes   Referring Provider Melinda Crutch, MD   Hand Dominance Right     Prior Function   Level of Independence Requires assistive device for independence;Independent with gait                     [facility] Adult PT Treatment/Exercise - 02/27/16 0001       Transfers   Transfers Sit to Stand;Stand to Sit   Sit to Stand 5: Supervision   Sit to Stand Details (indicate cue type and reason) Blocked practice of sit <> stand from lowest mat table height (as daughter reports pt difficulty standing from shower chair). Pt continues to require cueing for setup, hand placement, to consistently lock rollator brakes (although pt now locks brakes >50% of the time). PT added paper visual aid for said cueing to rollator seat as an external cue; pt did require cueing to read this paper during this session.   Stand to Sit 5: Supervision   Stand to Sit Details cueing for safe use of rollator, to lock brakes, and for hand placement     Self-Care   Self-Care Other Self-Care Comments   Other Self-Care Comments  Provided written resources for community fitness and fall prevention. Strongly recommending pt try A.H.O.Y. exercise on Channel 13 once between now and next (final) PT session, as this resource removes all barriers expressed by daughter. Provided fall prevention strategies handout again (per daughter request). Also provided exercise tracking sheet due to daughter report of HEP noncompliance and pt remaining in bed for hours per day. Provided education on Silver Sneakers and [employer]'s near pt/daughter's home. Explained and demonstrated how to safely get onto/off of NuStep; daughter gave effective  return demo of verbal cueing.     Exercises   Exercises Other Exercises   Other Exercises  NuStep using BUE/BLE's x9 minutes at resistance level 4.  Pt performed NuStep while PT printing out written resources.                PT Education - 02/27/16 1819    Education provided Yes   Education Details Provided pt/daughter with paper handout of resources for community fitness and fall prevention. Extensive education provided on transition to community exercise; see Self Care for details.    Person(s) Educated Patient;Child(ren)   Methods  Explanation;Demonstration;Handout   Comprehension Verbalized understanding;Need further instruction;Returned demonstration          PT Short Term Goals - 02/27/16 1824      PT SHORT TERM GOAL #1   Title Patient demonstrates understanding of [place] with daughter cueing. (Target Date: 01/25/2016)   Baseline 01/25/16: met today   Status Achieved     PT SHORT TERM GOAL #2   Title Timed Up-Go with rollator walker <31sec with supervision. (Target Date: 01/25/2016)   Baseline 01/25/16: met with score of 24.72 sec's with rollator   Status Achieved     PT SHORT TERM GOAL #3   Title Patient and dtr verbalize understanding of Orthostatic Hypotension recomendations. (Target Date: 01/25/2016)   Baseline 01/09/16 met today   Status Achieved     PT SHORT TERM GOAL #4   Title Patient ambulates 150' with rollator walker with supervision /verbal cues. (Target Date: 01/25/2016)   Baseline 01/25/16: met today- 220 feet with rollator with supervision/occasional verbal cues needed.   Status Achieved     PT SHORT TERM GOAL #5   Title --   Status --           PT Long Term Goals - 02/27/16 1847      PT LONG TERM GOAL #1   Title Patient with daughters cueing is able to demonstrate understanding of ongoing HEP / fitness plan. (Target Date: 02/22/2016)   Baseline 02/20/16 Daughter present and verbalized understanding of HEP; Reports pt is not performing.   Time 8   Period Weeks   Status Achieved     PT LONG TERM GOAL #2   Title Berg Balance >/= 27/56 to indicate lower fall risk. (Target Date: 02/22/2016)   Baseline 11/1/17Merrilee Jansky = 34/56   Time 8   Period Weeks   Status Achieved     PT LONG TERM GOAL #3   Title Timed Up-Go with rollator walker <28sec to indicate lower fall risk. (Target Date: 02/22/2016)   Baseline 11/1: TUG = 28.25 seconds with rollator   Time 8   Period Weeks   Status Partially Met     PT LONG TERM GOAL #4   Title Patient and daughter verbalize understanding of  fall prevention strategies including orthostatic hypotension recommendations. (Target Date: 02/22/2016)   Baseline 02/20/16 Reviewed fall prevention stratigies with pt and daughter.   Time 8   Period Weeks   Status Achieved     PT LONG TERM GOAL #5   Title Patient ambulates 250' with rollator walker with supervision. (Target Date: 02/22/2016)   Baseline 02/20/16 Pt ambulated 325 ft with rollator, including ramp and curb   Time 8   Period Weeks   Status Achieved     Additional Long Term Goals   Additional Long Term Goals Yes     PT LONG TERM GOAL #6   Title Patient negotiates ramps &  curbs with rollator walker with minA from dtr safely. (Target Date: 02/22/2016)   Baseline 02/20/2016 Patient negotiated ramps and curb with rollator; Min guard; VC for technique and sequencing.   Time 8   Period Weeks   Status Achieved     PT LONG TERM GOAL #7   Title Caregiver will verbalize understanding of resources for community exercise to facilitate safe transition to community fitness following DC from PT.  (Target: 02/29/16)   Status New               Plan - 02/27/16 1836    Clinical Impression Statement Pt appears to have met or partially met all current LTG's; however, daughter reports pt noncompliance with HEP and no plan for continued exercise after this episode of PT. Therefore, current session focused on providing pt/daughter with resources to facilitate safe transition to community exercise. LTG added to address this. After discussion of barriers to community exercise, PT recommended pt try A.H.O.Y. exercise on Channel 13 between now and last PT session on 11/17. Pt will benefit from one additional PT session to ensure caregiver understands all available resources. Pt/daughter in full agreement.   Rehab Potential Good   PT Frequency 2x / week   PT Duration 8 weeks   PT Treatment/Interventions ADLs/Self Care Home Management;DME Instruction;Gait training;Stair training;Functional  mobility training;Therapeutic activities;Therapeutic exercise;Balance training;Neuromuscular re-education;Patient/family education   PT Next Visit Plan Assess remaining LTG and discharge.  [employer] and [place].   Consulted and Agree with Plan of Care Patient;Family member/caregiver   Family Member Consulted Daughter      Patient will benefit from skilled therapeutic intervention in order to improve the following deficits and impairments:  Abnormal gait, Decreased activity tolerance, Decreased cognition, Decreased balance, Decreased knowledge of use of DME, Decreased mobility, Decreased range of motion, Dizziness, Decreased strength, Postural dysfunction, Obesity, Pain  Visit Diagnosis: Other abnormalities of gait and mobility - Plan: PT plan of care cert/re-cert  Muscle weakness (generalized) - Plan: PT plan of care cert/re-cert  Unsteadiness on feet - Plan: PT plan of care cert/re-cert     Problem List Patient Active Problem List   Diagnosis Date Noted  . Memory loss 10/22/2012    Billie Ruddy, PT, DPT [facility] [address] [place], [place], [address] Phone: [phone]   Fax:  [phone] 02/27/16, 6:50 PM  Name: Kendrah Lovern MRN: [id number] Date of Birth: 1925/01/29

## 2016-02-27 NOTE — Patient Instructions (Addendum)
Fall Prevention in the Home   Falls can cause injuries. They can happen to people of all ages. There are many things you can do to make your home safe and to help prevent falls.  WHAT CAN I DO ON THE OUTSIDE OF MY HOME?  Regularly fix the edges of walkways and driveways and fix any cracks.  Remove anything that might make you trip as you walk through a door, such as a raised step or threshold.  Trim any bushes or trees on the path to your home.  Use bright outdoor lighting.  Clear any walking paths of anything that might make someone trip, such as rocks or tools.  Regularly check to see if handrails are loose or broken. Make sure that both sides of any steps have handrails.  Any raised decks and porches should have guardrails on the edges.  Have any leaves, snow, or ice cleared regularly.  Use sand or salt on walking paths during winter.  Clean up any spills in your garage right away. This includes oil or grease spills. WHAT CAN I DO IN THE BATHROOM?   Use night lights.  Install grab bars by the toilet and in the tub and shower. Do not use towel bars as grab bars.  Use non-skid mats or decals in the tub or shower.  If you need to sit down in the shower, use a plastic, non-slip stool.  Keep the floor dry. Clean up any water that spills on the floor as soon as it happens.  Remove soap buildup in the tub or shower regularly.  Attach bath mats securely with double-sided non-slip rug tape.  Do not have throw rugs and other things on the floor that can make you trip. WHAT CAN I DO IN THE BEDROOM?  Use night lights.  Make sure that you have a light by your bed that is easy to reach.  Do not use any sheets or blankets that are too big for your bed. They should not hang down onto the floor.  Have a firm chair that has side arms. You can use this for support while you get dressed.  Do not have throw rugs and other things on the floor that can make you trip. WHAT CAN I DO  IN THE KITCHEN?  Clean up any spills right away.  Avoid walking on wet floors.  Keep items that you use a lot in easy-to-reach places.  If you need to reach something above you, use a strong step stool that has a grab bar.  Keep electrical cords out of the way.  Do not use floor polish or wax that makes floors slippery. If you must use wax, use non-skid floor wax.  Do not have throw rugs and other things on the floor that can make you trip. WHAT CAN I DO WITH MY STAIRS?  Do not leave any items on the stairs.  Make sure that there are handrails on both sides of the stairs and use them. Fix handrails that are broken or loose. Make sure that handrails are as long as the stairways.  Check any carpeting to make sure that it is firmly attached to the stairs. Fix any carpet that is loose or worn.  Avoid having throw rugs at the top or bottom of the stairs. If you do have throw rugs, attach them to the floor with carpet tape.  Make sure that you have a light switch at the top of the stairs and the bottom of  the stairs. If you do not have them, ask someone to add them for you. WHAT ELSE CAN I DO TO HELP PREVENT FALLS?  Wear shoes that: ? Do not have high heels. ? Have rubber bottoms. ? Are comfortable and fit you well. ? Are closed at the toe. Do not wear sandals.  If you use a stepladder: ? Make sure that it is fully opened. Do not climb a closed stepladder. ? Make sure that both sides of the stepladder are locked into place. ? Ask someone to hold it for you, if possible.  Clearly mark and make sure that you can see: ? Any grab bars or handrails. ? First and last steps. ? Where the edge of each step is.  Use tools that help you move around (mobility aids) if they are needed. These include: ? Canes. ? Walkers. ? Scooters. ? Crutches.  Turn on the lights when you go into a dark area. Replace any light bulbs as soon as they burn out.  Set up your furniture so you have a clear  path. Avoid moving your furniture around.  If any of your floors are uneven, fix them.  If there are any pets around you, be aware of where they are.  Review your medicines with your doctor. Some medicines can make you feel dizzy. This can increase your chance of falling. Ask your doctor what other things that you can do to help prevent falls.  This information is not intended to replace advice given to you by your health care provider. Make sure you discuss any questions you have with your health care provider.    Community Occupational psychologist of Services Cost  A Matter of Balance Class locations vary. Call Katherine on Aging for more information.  http://dawson-may.com/ [phone] 8-Session program addressing the fear of falling and increasing activity levels of older adults Free to minimal cost  A.C.T. By [employer] [address] [place], [place] [address].  BetaBlues.dk [phone]  Personal training, gym, classes including Silver Sneakers* and ACTion for Aging Adults Fee-based  A.H.O.Y. (Add Health to [place]) Airs on Time [employer] 13, M-F at [place]: [employer],  [place] [facility] [employer] [place],  [place], [place] [facility], [address] [facility], [place], [place], [place] [address]  [place] Location: Sharrell Ku. [employer] [place]      [phone]  [phone]  [phone]  [phone]  [phone]  [phone]  [phone]  [phone]  [phone]  [phone]    [phone] A total-body conditioning class for adults 70 and older; designed to increase muscular strength,  endurance, range of movement, flexibility, balance, agility and coordination Free  [facility] [place], [place] [address] [place]      [address]      [phone]      Pilate's class for individualsreturning to exercise after an injury, before or after surgery or for individuals with complex musculoskeletal issues; designed to improve strength, balance , flexibility      $15/class  [place]  [address] [place], [place] [address] www.[email or web address].dk [place] classes for beginners to advanced [place], [place] [address] Seniorcenter_0 -resources-[email or web address] www.[email or web address]-[email or web address]-[email or web address].center.[email or web address] [place] Chair Exercises Free, ages 30 and older; Ages 85-59 fee based  Marvia Pickles, [facility] [address] [place] [address] [place], [place] [address] Seniorcenter_1 .Beverlee Nims [phone]  A.H.O.Y. Tai Chi Fee-based Donation based or free  Strawberry Class locations vary.  Call or email Angela Burke or view website for more information. Info_[email or web address] .com GainPain.com.cy.html [phone] Ongoing classes at local YMCAs and gyms Fee-based  Silver Sneakers A.C.T. By [place] [employer]: [place] [employer] [place] [phone]  [phone] [phone] Classes designed for older adults who want to improve their strength, flexibility, balance and  endurance.   Silver sneakers is covered by some insurance plans and includes a fitness center membership at participating locations. Find out more by calling [phone] or visiting www.silversneakers.com Covered by some insurance plans  [facility] [place] [phone] A.H.O.Y., fitness room, personal training, fitness classes for injury prevention, strength, balance, flexibility, water fitness classes Ages 55+: $48 for 6 months; Ages 60-54: $59 for 6 months  Tai Chi for Everybody [facility] [address] [place], [place] [address] Taichiforeverybody_3 .Patsi Sears [phone] Tai Chi classes for beginners to advanced; geared for seniors Donation Based      [employer] (Helpling Others Participate in Exercise     Loyal Gambler. Rosana Hoes, PhD, [employer] pgdavis_[email or web address] .edu [place]     [phone]     A comprehensive fitness program for adults.  The program paris senior-level undergraduates Kinesiology students with adults who desire to learn how to exercise safely.  Includes a structural exercise class focusing on functional fitnesss     $100/semester in fall and spring; $75 in summer (no trainers)    *Silver Sneakers is covered by some Personal assistant and includes a  Radio producer at participating locations.  Find out more by calling [phone] or visiting www.[email or web address]  For additional health and human services resources for senior adults, please contact SeniorLine at [phone] in [place] and [place] at [phone] in all other areas.                                                    Week # # of hours out of bed    Walking program  Goal: 5 tally marks per week Exercises  Goal: 5 tally marks per week  _5 7

## 2016-02-29 ENCOUNTER — Ambulatory Visit: Payer: Medicare Other | Admitting: Physical Therapy

## 2016-02-29 ENCOUNTER — Encounter: Payer: Self-pay | Admitting: Physical Therapy

## 2016-02-29 DIAGNOSIS — R2681 Unsteadiness on feet: Secondary | ICD-10-CM | POA: Diagnosis not present

## 2016-02-29 DIAGNOSIS — R2689 Other abnormalities of gait and mobility: Secondary | ICD-10-CM

## 2016-02-29 DIAGNOSIS — M6281 Muscle weakness (generalized): Secondary | ICD-10-CM | POA: Diagnosis not present

## 2016-02-29 DIAGNOSIS — R42 Dizziness and giddiness: Secondary | ICD-10-CM | POA: Diagnosis not present

## 2016-02-29 NOTE — Therapy (Signed)
[facility] [address] [place], [place], [address] Phone: [phone]   Fax:  [phone]  Physical Therapy Treatment  Patient Details  Name: Mollee Neer MRN: [id number] Date of Birth: 08/08/1924 Referring Provider: Duane Lope, MD  Encounter Date: 02/29/2016      PT End of Session - 02/29/16 1625    Visit Number 17   Number of Visits 17   Date for PT Re-Evaluation 03/23/16   Authorization Type Medicare G-code & progress note   PT Start Time 1530   PT Stop Time 1548  Discharge, full time not needed   PT Time Calculation (min) 18 min   Equipment Utilized During Treatment Gait belt   Activity Tolerance Patient tolerated treatment well   Behavior During Therapy [facility] for tasks assessed/performed      History reviewed. No pertinent past medical history.  History reviewed. No pertinent surgical history.  There were no vitals filed for this visit.      Subjective Assessment - 02/29/16 1534    Subjective Patient reports that she is performing the exercises on TV that were reccomended to her last session.   Patient is accompained by: Family member  son in law   Pertinent History dementia, hard of hearing, COPD, HTN, OA.osteoporosis   Limitations Walking;House hold activities;Standing   Patient Stated Goals "I want to walk with out my walker"   Currently in Pain? No/denies           [facility] PT Assessment - 02/29/16 1616      Timed Up and Go Test   TUG Normal TUG   Normal TUG (seconds) 28.25            [facility] Adult PT Treatment/Exercise - 02/29/16 1619      Transfers   Transfers Sit to Stand;Stand to Sit   Sit to Stand 5: Supervision   Sit to Stand Details Verbal cues for sequencing;Verbal cues for technique   Sit to Stand Details (indicate cue type and reason) VC to push up from the chair instead of pulling up on the walker   Stand to Sit 5: Supervision   Stand to Sit Details (indicate cue type and  reason) Verbal cues for sequencing;Verbal cues for technique   Stand to Sit Details cueing for safe use of rollator, to lock brakes, and for hand placement     Ambulation/Gait   Ramp 4: Min assist  min guard   Ramp Details (indicate cue type and reason) VC for proper technique and sequencing with rollator   Curb 4: Min assist  min guard   Curb Details (indicate cue type and reason) VC for proper technique and sequencing with rollator   Gait Comments Son in law educated on correct technique for ramps and curbs using rollator so he can cue patient when ambulating out of the house           PT Education - 02/29/16 1623    Education provided Yes   Education Details Spoke with son in law on the importance of exercise for the patient and educated on proper technique for ramp and curb   Person(s) Educated Patient;Child(ren)   Methods Explanation;Demonstration   Comprehension Verbalized understanding;Returned demonstration;Need further instruction          PT Short Term Goals - 02/27/16 1824      PT SHORT TERM GOAL #1   Title Patient demonstrates understanding of [place] HEP with daughter cueing. (Target Date: 01/25/2016)   Baseline 01/25/16: met today  Status Achieved     PT SHORT TERM GOAL #2   Title Timed Up-Go with rollator walker <31sec with supervision. (Target Date: 01/25/2016)   Baseline 01/25/16: met with score of 24.72 sec's with rollator   Status Achieved     PT SHORT TERM GOAL #3   Title Patient and dtr verbalize understanding of Orthostatic Hypotension recomendations. (Target Date: 01/25/2016)   Baseline 01/09/16 met today   Status Achieved     PT SHORT TERM GOAL #4   Title Patient ambulates 150' with rollator walker with supervision /verbal cues. (Target Date: 01/25/2016)   Baseline 01/25/16: met today- 220 feet with rollator with supervision/occasional verbal cues needed.   Status Achieved     PT SHORT TERM GOAL #5   Title --   Status --           PT  Long Term Goals - 02/29/16 1541      PT LONG TERM GOAL #1   Title Patient with daughters cueing is able to demonstrate understanding of ongoing HEP / fitness plan. (Target Date: 02/22/2016)   Baseline 02/20/16 Daughter present and verbalized understanding of HEP; Reports pt is not performing.   Time 8   Period Weeks   Status Achieved     PT LONG TERM GOAL #2   Title Berg Balance >/= 27/56 to indicate lower fall risk. (Target Date: 02/22/2016)   Baseline 11/1/17Merrilee Jansky = 34/56   Time 8   Period Weeks   Status Achieved     PT LONG TERM GOAL #3   Title Timed Up-Go with rollator walker <28sec to indicate lower fall risk. (Target Date: 02/22/2016)   Baseline 11/17: TUG = 28.25 seconds with rollator   Time 8   Period Weeks   Status Partially Met     PT LONG TERM GOAL #4   Title Patient and daughter verbalize understanding of fall prevention strategies including orthostatic hypotension recommendations. (Target Date: 02/22/2016)   Baseline 02/20/16 Reviewed fall prevention stratigies with pt and daughter.   Time 8   Period Weeks   Status Achieved     PT LONG TERM GOAL #5   Title Patient ambulates 250' with rollator walker with supervision. (Target Date: 02/22/2016)   Baseline 02/20/16 Pt ambulated 325 ft with rollator, including ramp and curb   Time 8   Period Weeks   Status Achieved     PT LONG TERM GOAL #6   Title Patient negotiates ramps & curbs with rollator walker with minA from dtr safely. (Target Date: 02/22/2016)   Baseline 02/20/2016 Patient negotiated ramps and curb with rollator; Min guard; VC for technique and sequencing.   Time 8   Period Weeks   Status Achieved     PT LONG TERM GOAL #7   Title Caregiver will verbalize understanding of resources for community exercise to facilitate safe transition to community fitness following DC from PT.  (Target: 02/29/16)   Status Achieved           Plan - 02/29/16 1626    Clinical Impression Statement Todays session  focused on completing LTGs and educating family on patient's need to exercise and safe use of the rollator. Patient has achieved 6 of 7 LTGs. Discharge today.   Rehab Potential Good   PT Frequency 2x / week   PT Duration 8 weeks   PT Treatment/Interventions ADLs/Self Care Home Management;DME Instruction;Gait training;Stair training;Functional mobility training;Therapeutic activities;Therapeutic exercise;Balance training;Neuromuscular re-education;Patient/family education   PT Next Visit Plan Discharge today   Consulted  and Agree with Plan of Care Patient;Family member/caregiver   Family Member Consulted Daughter      Patient will benefit from skilled therapeutic intervention in order to improve the following deficits and impairments:  Abnormal gait, Decreased activity tolerance, Decreased cognition, Decreased balance, Decreased knowledge of use of DME, Decreased mobility, Decreased range of motion, Dizziness, Decreased strength, Postural dysfunction, Obesity, Pain  Visit Diagnosis: Other abnormalities of gait and mobility  Muscle weakness (generalized)  Unsteadiness on feet  Dizziness and giddiness       G-Codes - March 24, 2016 1633    Functional Assessment Tool Used Timed Up-Go with rollator walker 25.28 sec with supervision   Functional Limitation Mobility: Walking and moving around      Problem List Patient Active Problem List   Diagnosis Date Noted  . Memory loss 10/22/2012    Benjiman Core, [place] 03/24/16, 4:33 PM  [facility] [address] [place], [place], [address] Phone: [phone]   Fax:  [phone]  Name: Valleri Hendricksen MRN: [id number] Date of Birth: 01-Nov-1924

## 2016-03-12 DIAGNOSIS — E119 Type 2 diabetes mellitus without complications: Secondary | ICD-10-CM | POA: Diagnosis not present

## 2016-03-12 DIAGNOSIS — E7800 Pure hypercholesterolemia, unspecified: Secondary | ICD-10-CM | POA: Diagnosis not present

## 2016-03-12 DIAGNOSIS — F0390 Unspecified dementia without behavioral disturbance: Secondary | ICD-10-CM | POA: Diagnosis not present

## 2016-03-12 DIAGNOSIS — H524 Presbyopia: Secondary | ICD-10-CM | POA: Diagnosis not present

## 2016-03-12 DIAGNOSIS — E1142 Type 2 diabetes mellitus with diabetic polyneuropathy: Secondary | ICD-10-CM | POA: Diagnosis not present

## 2016-03-12 DIAGNOSIS — M25471 Effusion, right ankle: Secondary | ICD-10-CM | POA: Diagnosis not present

## 2016-03-12 DIAGNOSIS — J309 Allergic rhinitis, unspecified: Secondary | ICD-10-CM | POA: Diagnosis not present

## 2016-03-12 DIAGNOSIS — Z0000 Encounter for general adult medical examination without abnormal findings: Secondary | ICD-10-CM | POA: Diagnosis not present

## 2016-03-12 DIAGNOSIS — Z961 Presence of intraocular lens: Secondary | ICD-10-CM | POA: Diagnosis not present

## 2016-03-12 DIAGNOSIS — I10 Essential (primary) hypertension: Secondary | ICD-10-CM | POA: Diagnosis not present

## 2016-03-12 DIAGNOSIS — M810 Age-related osteoporosis without current pathological fracture: Secondary | ICD-10-CM | POA: Diagnosis not present

## 2016-03-12 DIAGNOSIS — J449 Chronic obstructive pulmonary disease, unspecified: Secondary | ICD-10-CM | POA: Diagnosis not present

## 2016-03-12 DIAGNOSIS — M25571 Pain in right ankle and joints of right foot: Secondary | ICD-10-CM | POA: Diagnosis not present

## 2016-03-12 DIAGNOSIS — M79671 Pain in right foot: Secondary | ICD-10-CM | POA: Diagnosis not present

## 2016-03-12 DIAGNOSIS — H04123 Dry eye syndrome of bilateral lacrimal glands: Secondary | ICD-10-CM | POA: Diagnosis not present

## 2016-03-12 DIAGNOSIS — K219 Gastro-esophageal reflux disease without esophagitis: Secondary | ICD-10-CM | POA: Diagnosis not present

## 2016-03-21 ENCOUNTER — Ambulatory Visit (HOSPITAL_COMMUNITY)
Admission: RE | Admit: 2016-03-21 | Discharge: 2016-03-21 | Disposition: A | Discharge status: Home / Self Care | Payer: Medicare Other | Source: Ambulatory Visit | Attending: Internal Medicine | Admitting: Internal Medicine

## 2016-03-21 ENCOUNTER — Other Ambulatory Visit (HOSPITAL_COMMUNITY): Payer: Self-pay | Admitting: Physician Assistant

## 2016-03-21 DIAGNOSIS — M7989 Other specified soft tissue disorders: Secondary | ICD-10-CM | POA: Diagnosis not present

## 2016-03-21 DIAGNOSIS — E669 Obesity, unspecified: Secondary | ICD-10-CM | POA: Diagnosis not present

## 2016-03-21 DIAGNOSIS — Z6832 Body mass index (BMI) 32.0-32.9, adult: Secondary | ICD-10-CM | POA: Diagnosis not present

## 2016-03-21 DIAGNOSIS — M79661 Pain in right lower leg: Secondary | ICD-10-CM | POA: Insufficient documentation

## 2016-03-21 DIAGNOSIS — I10 Essential (primary) hypertension: Secondary | ICD-10-CM | POA: Diagnosis not present

## 2016-03-21 DIAGNOSIS — E785 Hyperlipidemia, unspecified: Secondary | ICD-10-CM | POA: Diagnosis not present

## 2016-03-21 NOTE — Progress Notes (Signed)
*  PRELIMINARY RESULTS* Vascular Ultrasound Left lower extremity venous duplex has been completed.  Preliminary findings: No evidence of DVT or baker's cyst.   Called results to [facility]  Landry Mellow, RDMS, RVT  03/21/2016, 10:30 AM

## 2016-04-03 ENCOUNTER — Encounter: Payer: Self-pay | Admitting: Physical Therapy

## 2016-04-03 NOTE — Therapy (Signed)
[facility] [address] [place], [place], [address] Phone: [phone]   Fax:  [phone]  Patient Details  Name: Candice Miller MRN: [id number] Date of Birth: 1924/04/29 Referring Provider:  Melinda Crutch, MD  Encounter Date: 04/03/2016  PHYSICAL THERAPY DISCHARGE SUMMARY  Visits from Start of Care: 17  Current functional level related to goals / functional outcomes:     PT Long Term Goals - 02/29/16 1541      PT LONG TERM GOAL #1   Title Patient with daughters cueing is able to demonstrate understanding of ongoing HEP / fitness plan. (Target Date: 02/22/2016)   Baseline 02/20/16 Daughter present and verbalized understanding of HEP; Reports pt is not performing.   Time 8   Period Weeks   Status Achieved     PT LONG TERM GOAL #2   Title Berg Balance >/= 27/56 to indicate lower fall risk. (Target Date: 02/22/2016)   Baseline 11/1/17Merrilee Jansky = 34/56   Time 8   Period Weeks   Status Achieved     PT LONG TERM GOAL #3   Title Timed Up-Go with rollator walker <28sec to indicate lower fall risk. (Target Date: 02/22/2016)   Baseline 11/17: TUG = 28.25 seconds with rollator   Time 8   Period Weeks   Status Partially Met     PT LONG TERM GOAL #4   Title Patient and daughter verbalize understanding of fall prevention strategies including orthostatic hypotension recommendations. (Target Date: 02/22/2016)   Baseline 02/20/16 Reviewed fall prevention stratigies with pt and daughter.   Time 8   Period Weeks   Status Achieved     PT LONG TERM GOAL #5   Title Patient ambulates 250' with rollator walker with supervision. (Target Date: 02/22/2016)   Baseline 02/20/16 Pt ambulated 325 ft with rollator, including ramp and curb   Time 8   Period Weeks   Status Achieved     PT LONG TERM GOAL #6   Title Patient negotiates ramps & curbs with rollator walker with minA from dtr safely. (Target Date: 02/22/2016)   Baseline  02/20/2016 Patient negotiated ramps and curb with rollator; Min guard; VC for technique and sequencing.   Time 8   Period Weeks   Status Achieved     PT LONG TERM GOAL #7   Title Caregiver will verbalize understanding of resources for community exercise to facilitate safe transition to community fitness following DC from PT.  (Target: 02/29/16)   Status Achieved       Remaining deficits: See above. Berg Balance score 34/56 & Timed Up -Go 28.25sec both indicate fall risk but improvement from evaluation indicates lower fall risk.    Education / Equipment: HEP, use of assistive device & fall prevention strategies.   Plan: Patient agrees to discharge.  Patient goals were met. Patient is being discharged due to meeting the stated rehab goals.  ?????        Carnetta Losada PT, DPT 04/03/2016, 11:26 AM  [facility] [address] [place], [place], [address] Phone: [phone]   Fax:  [phone]

## 2016-04-18 DIAGNOSIS — I10 Essential (primary) hypertension: Secondary | ICD-10-CM | POA: Diagnosis not present

## 2016-04-18 DIAGNOSIS — M25471 Effusion, right ankle: Secondary | ICD-10-CM | POA: Diagnosis not present

## 2016-04-18 DIAGNOSIS — E669 Obesity, unspecified: Secondary | ICD-10-CM | POA: Diagnosis not present

## 2016-04-18 DIAGNOSIS — Z6833 Body mass index (BMI) 33.0-33.9, adult: Secondary | ICD-10-CM | POA: Diagnosis not present

## 2016-04-18 DIAGNOSIS — M7989 Other specified soft tissue disorders: Secondary | ICD-10-CM | POA: Diagnosis not present

## 2016-04-18 DIAGNOSIS — E785 Hyperlipidemia, unspecified: Secondary | ICD-10-CM | POA: Diagnosis not present

## 2016-04-23 DIAGNOSIS — M79671 Pain in right foot: Secondary | ICD-10-CM | POA: Diagnosis not present

## 2016-05-28 DIAGNOSIS — M79671 Pain in right foot: Secondary | ICD-10-CM | POA: Diagnosis not present

## 2016-06-10 DIAGNOSIS — M5489 Other dorsalgia: Secondary | ICD-10-CM | POA: Diagnosis not present

## 2016-06-10 DIAGNOSIS — T148XXA Other injury of unspecified body region, initial encounter: Secondary | ICD-10-CM | POA: Diagnosis not present

## 2016-06-11 ENCOUNTER — Encounter (HOSPITAL_COMMUNITY): Payer: Self-pay

## 2016-06-11 ENCOUNTER — Emergency Department (HOSPITAL_COMMUNITY): Payer: Medicare Other

## 2016-06-11 ENCOUNTER — Emergency Department (HOSPITAL_COMMUNITY)
Admission: EM | Admit: 2016-06-11 | Discharge: 2016-06-11 | Disposition: A | Discharge status: Home / Self Care | Payer: Medicare Other | Attending: Emergency Medicine | Admitting: Emergency Medicine

## 2016-06-11 DIAGNOSIS — Z79899 Other long term (current) drug therapy: Secondary | ICD-10-CM | POA: Diagnosis not present

## 2016-06-11 DIAGNOSIS — E119 Type 2 diabetes mellitus without complications: Secondary | ICD-10-CM | POA: Insufficient documentation

## 2016-06-11 DIAGNOSIS — Z7984 Long term (current) use of oral hypoglycemic drugs: Secondary | ICD-10-CM | POA: Diagnosis not present

## 2016-06-11 DIAGNOSIS — S3992XA Unspecified injury of lower back, initial encounter: Secondary | ICD-10-CM | POA: Diagnosis not present

## 2016-06-11 DIAGNOSIS — Y939 Activity, unspecified: Secondary | ICD-10-CM | POA: Insufficient documentation

## 2016-06-11 DIAGNOSIS — J449 Chronic obstructive pulmonary disease, unspecified: Secondary | ICD-10-CM | POA: Insufficient documentation

## 2016-06-11 DIAGNOSIS — I10 Essential (primary) hypertension: Secondary | ICD-10-CM | POA: Diagnosis not present

## 2016-06-11 DIAGNOSIS — Y999 Unspecified external cause status: Secondary | ICD-10-CM | POA: Diagnosis not present

## 2016-06-11 DIAGNOSIS — W19XXXA Unspecified fall, initial encounter: Secondary | ICD-10-CM

## 2016-06-11 DIAGNOSIS — Y92009 Unspecified place in unspecified non-institutional (private) residence as the place of occurrence of the external cause: Secondary | ICD-10-CM | POA: Insufficient documentation

## 2016-06-11 DIAGNOSIS — M545 Low back pain: Secondary | ICD-10-CM | POA: Insufficient documentation

## 2016-06-11 HISTORY — DX: Essential (primary) hypertension: I10

## 2016-06-11 HISTORY — DX: Type 2 diabetes mellitus without complications: E11.9

## 2016-06-11 HISTORY — DX: Chronic obstructive pulmonary disease, unspecified: J44.9

## 2016-06-11 NOTE — ED Triage Notes (Signed)
Pt presents via EMS from home with c/o fall that occurred earlier this evening. Per EMS, pt reports that pt said she was closing the door and fell straight back onto her buttocks after losing her balance. Pt reporting lower back pain post fall. Pt denies any neck pain. Pt also denied any dizziness prior to the fall. No obvious deformities. Pt was able to stand with EMS assistance.

## 2016-06-11 NOTE — ED Provider Notes (Signed)
[facility] DEPT Provider Note   CSN: [id number] Arrival date & time: 06/11/16  [id number]   By signing my name below, I, Soijett Blue, attest that this documentation has been prepared under the direction and in the presence of Everlene Balls, MD. Electronically Signed: Soijett Blue, ED Scribe. 06/11/16. 2:44 AM.  History   Chief Complaint Chief Complaint  Patient presents with  . Fall    HPI Candice Miller is a 83 y.o. female with a PMHx of COPD, DM, HTN, who presents to the Emergency Department brought in by EMS complaining of an unwitnessed fall onset PTA. Pt reports associated lower back pain. Pt has not tried any medications for the relief of her symptoms. Family notes they heard a crash and found the pt sitting on the floor in front of a broken door. Pt reports that she was ambulating with her walker to bed when she fell backwards onto her tailbone and landed on the floor. She denies hitting her head, LOC, vomiting, diarrhea, fever, cough, and any other symptoms.     The history is provided by the patient and a relative. No language interpreter was used.    Past Medical History:  Diagnosis Date  . COPD (chronic obstructive pulmonary disease) ([place])   . Diabetes mellitus without complication ([place])   . Hypertension     Patient Active Problem List   Diagnosis Date Noted  . Memory loss 10/22/2012    Past Surgical History:  Procedure Laterality Date  . CHOLECYSTECTOMY      OB History    No data available       Home Medications    Prior to Admission medications   Medication Sig Start Date End Date Taking? Authorizing Provider  albuterol (PROVENTIL HFA;VENTOLIN HFA) 108 (90 Base) MCG/ACT inhaler Inhale 2 puffs into the lungs.    Historical Provider, MD  budesonide-formoterol (SYMBICORT) 80-4.5 MCG/ACT inhaler Inhale 2 puffs into the lungs 2 (two) times daily.    Historical Provider, MD  calcium acetate (PHOSLO) 667 MG capsule Take 150 mg by mouth.    Historical Provider,  MD  cholecalciferol (VITAMIN D) 1000 units tablet Take 1,000 Units by mouth daily.    Historical Provider, MD  diltiazem (CARDIZEM CD) 180 MG 24 hr capsule Take 180 mg by mouth daily.    Historical Provider, MD  fexofenadine (ALLEGRA) 180 MG tablet Take 180 mg by mouth daily.    Historical Provider, MD  fluticasone (FLONASE) 50 MCG/ACT nasal spray Place 2 sprays into both nostrils daily.    Historical Provider, MD  gabapentin (NEURONTIN) 300 MG capsule Take 300 mg by mouth.    Historical Provider, MD  gemfibrozil (LOPID) 600 MG tablet Take 600 mg by mouth 2 (two) times daily before a meal.    Historical Provider, MD  glimepiride (AMARYL) 1 MG tablet Take 1 mg by mouth daily with breakfast.    Historical Provider, MD  ipratropium-albuterol (DUONEB) 0.5-2.5 (3) MG/3ML SOLN Take 3 mLs by nebulization.    Historical Provider, MD  l-methylfolate-B6-B12 (METANX) 3-35-2 MG TABS tablet Take 1 tablet by mouth daily.    Historical Provider, MD  lisinopril (PRINIVIL,ZESTRIL) 10 MG tablet Take 10 mg by mouth daily.    Historical Provider, MD  memantine [facility] TITRATION PACK) tablet pack Take 28 mg by mouth See admin instructions. 5 mg/day for =1 week; 5 mg twice daily for =1 week; 15 mg/day given in 5 mg and 10 mg separated doses for =1 week; then 10 mg twice daily  Historical Provider, MD  nitroGLYCERIN (NITRODUR - DOSED IN MG/24 HR) 0.4 mg/hr patch Place 0.4 mg onto the skin daily.    Historical Provider, MD  pseudoephedrine-acetaminophen (TYLENOL SINUS) 30-500 MG TABS tablet Take 81 tablets by mouth every 4 (four) hours as needed.    Historical Provider, MD  raloxifene (EVISTA) 60 MG tablet Take 60 mg by mouth daily.    Historical Provider, MD  ranitidine (ZANTAC) 150 MG tablet Take 150 mg by mouth 2 (two) times daily.    Historical Provider, MD    Family History No family history on file.  Social History Social History  Substance Use Topics  . Smoking status: Never Smoker  . Smokeless tobacco:  Never Used  . Alcohol use No     Allergies   Keflex [cephalexin]; Other; and Sulfa antibiotics   Review of Systems Review of Systems A complete 10 system review of systems was obtained and all systems are negative except as noted in the HPI and PMH.   Physical Exam Updated Vital Signs BP 141/62 (BP Location: Right Arm)   Pulse 69   Temp 98.1 F (36.7 C) (Oral)   Resp 20   Ht 5\' 2"  (1.575 m)   Wt 183 lb (83 kg)   SpO2 98%   BMI 33.47 kg/m   Physical Exam  Constitutional: She is oriented to person, place, and time. She appears well-developed and well-nourished. No distress.  HENT:  Head: Normocephalic and atraumatic.  Nose: Nose normal.  Mouth/Throat: Oropharynx is clear and moist. No oropharyngeal exudate.  Eyes: Conjunctivae and EOM are normal. Pupils are equal, round, and reactive to light. No scleral icterus.  Neck: Normal range of motion. Neck supple. No JVD present. No tracheal deviation present. No thyromegaly present.  Cardiovascular: Normal rate, regular rhythm and normal heart sounds.  Exam reveals no gallop and no friction rub.   No murmur heard. Pulmonary/Chest: Effort normal and breath sounds normal. No respiratory distress. She has no wheezes. She exhibits no tenderness.  Abdominal: Soft. Bowel sounds are normal. She exhibits no distension and no mass. There is no tenderness. There is no rebound and no guarding.  Musculoskeletal: Normal range of motion. She exhibits no edema or tenderness.  No cervical, thoracic, or lumbar midline spinal tenderness.   Lymphadenopathy:    She has no cervical adenopathy.  Neurological: She is alert and oriented to person, place, and time. No cranial nerve deficit. She exhibits normal muscle tone.  Nl strength and sensation to BUE and BLE  Skin: Skin is warm and dry. No rash noted. No erythema. No pallor.  Nursing note and vitals reviewed.    ED Treatments / Results  DIAGNOSTIC STUDIES: Oxygen Saturation is 98% on RA, nl by  my interpretation.    COORDINATION OF CARE: 2:44 AM Discussed treatment plan with pt at bedside which includes lumbar spine xray and pt agreed to plan.   Radiology Dg Lumbar Spine Complete  Result Date: 06/11/2016 CLINICAL DATA:  Status post fall at home. Coccygeal and lower back pain. Initial encounter. EXAM: LUMBAR SPINE - COMPLETE 4+ VIEW COMPARISON:  None. FINDINGS: There is no evidence of fracture or subluxation. There is chronic compression deformity at vertebral body L1. Vertebral bodies demonstrate normal alignment. Intervertebral disc spaces are preserved. The visualized neural foramina are grossly unremarkable in appearance. The visualized bowel gas pattern is unremarkable in appearance; air and stool are noted within the colon. The sacroiliac joints are within normal limits. Clips are noted within the right upper  quadrant, reflecting prior cholecystectomy. Scattered calcification is seen along the abdominal aorta. IMPRESSION: 1. No evidence of fracture or subluxation along the lumbar spine. 2. Chronic compression deformity of vertebral body L1. 3. Scattered aortic atherosclerosis. Electronically Signed   By: Garald Balding M.D.   On: 06/11/2016 01:12    Procedures Procedures (including critical care time)  Medications Ordered in ED Medications - No data to display   Initial Impression / Assessment and Plan / ED Course  I have reviewed the triage vital signs and the nursing notes.  Pertinent imaging results that were available during my care of the patient were reviewed by me and considered in my medical decision making (see chart for details).     Patient presents to the ED for a fall. History suggest a purely mechanical fall without prodromal symptoms.  She only has back on her low back but exam is unremarkable. Advised on tylenol and ibuprofen at home.  PCP fu advised within 3 days. She did not hit her head or have LOC.  No further imaging necessary. VS are normal. Patient is  safe for DC.     Final Clinical Impressions(s) / ED Diagnoses   Final diagnoses:  None    New Prescriptions New Prescriptions   No medications on file      I personally performed the services described in this documentation, which was scribed in my presence. The recorded information has been reviewed and is accurate.       Everlene Balls, MD 06/11/16 [phone]

## 2016-06-11 NOTE — ED Notes (Signed)
Bed: WA07 Expected date:  Expected time:  Means of arrival:  Comments: EMS 

## 2016-07-18 DIAGNOSIS — E785 Hyperlipidemia, unspecified: Secondary | ICD-10-CM | POA: Diagnosis not present

## 2016-07-18 DIAGNOSIS — M25471 Effusion, right ankle: Secondary | ICD-10-CM | POA: Diagnosis not present

## 2016-07-18 DIAGNOSIS — Z6833 Body mass index (BMI) 33.0-33.9, adult: Secondary | ICD-10-CM | POA: Diagnosis not present

## 2016-07-18 DIAGNOSIS — I10 Essential (primary) hypertension: Secondary | ICD-10-CM | POA: Diagnosis not present

## 2016-07-18 DIAGNOSIS — M7989 Other specified soft tissue disorders: Secondary | ICD-10-CM | POA: Diagnosis not present

## 2016-07-18 DIAGNOSIS — E669 Obesity, unspecified: Secondary | ICD-10-CM | POA: Diagnosis not present

## 2016-07-18 DIAGNOSIS — Z6832 Body mass index (BMI) 32.0-32.9, adult: Secondary | ICD-10-CM | POA: Diagnosis not present

## 2016-08-06 DIAGNOSIS — R600 Localized edema: Secondary | ICD-10-CM | POA: Diagnosis not present

## 2016-08-06 DIAGNOSIS — L03115 Cellulitis of right lower limb: Secondary | ICD-10-CM | POA: Diagnosis not present

## 2016-08-09 ENCOUNTER — Encounter (HOSPITAL_COMMUNITY): Payer: Self-pay | Admitting: Emergency Medicine

## 2016-08-09 ENCOUNTER — Emergency Department (HOSPITAL_COMMUNITY)
Admission: EM | Admit: 2016-08-09 | Discharge: 2016-08-09 | Disposition: A | Discharge status: Home / Self Care | Payer: Medicare Other | Attending: Emergency Medicine | Admitting: Emergency Medicine

## 2016-08-09 DIAGNOSIS — L03115 Cellulitis of right lower limb: Secondary | ICD-10-CM

## 2016-08-09 DIAGNOSIS — Z79899 Other long term (current) drug therapy: Secondary | ICD-10-CM | POA: Diagnosis not present

## 2016-08-09 DIAGNOSIS — I10 Essential (primary) hypertension: Secondary | ICD-10-CM | POA: Insufficient documentation

## 2016-08-09 DIAGNOSIS — J449 Chronic obstructive pulmonary disease, unspecified: Secondary | ICD-10-CM | POA: Insufficient documentation

## 2016-08-09 DIAGNOSIS — E119 Type 2 diabetes mellitus without complications: Secondary | ICD-10-CM | POA: Diagnosis not present

## 2016-08-09 DIAGNOSIS — B999 Unspecified infectious disease: Secondary | ICD-10-CM | POA: Diagnosis not present

## 2016-08-09 DIAGNOSIS — Z7984 Long term (current) use of oral hypoglycemic drugs: Secondary | ICD-10-CM | POA: Insufficient documentation

## 2016-08-09 DIAGNOSIS — L03119 Cellulitis of unspecified part of limb: Secondary | ICD-10-CM | POA: Diagnosis not present

## 2016-08-09 LAB — BASIC METABOLIC PANEL
ANION GAP: 7 (ref 5–15)
BUN: 16 mg/dL (ref 6–20)
CO2: 29 mmol/L (ref 22–32)
Calcium: 9.1 mg/dL (ref 8.9–10.3)
Chloride: 108 mmol/L (ref 101–111)
Creatinine, Ser: 0.81 mg/dL (ref 0.44–1.00)
GFR calc Af Amer: >60 mL/min (ref >60)
GFR calc non Af Amer: >60 mL/min (ref >60)
GLUCOSE: 94 mg/dL (ref 65–99)
POTASSIUM: 3.9 mmol/L (ref 3.5–5.1)
Sodium: 144 mmol/L (ref 135–145)

## 2016-08-09 LAB — CBC
HEMATOCRIT: 35.7 % — AB (ref 36.0–46.0)
Hemoglobin: 11.4 g/dL — ABNORMAL LOW (ref 12.0–15.0)
MCH: 29.8 pg (ref 26.0–34.0)
MCHC: 31.9 g/dL (ref 30.0–36.0)
MCV: 93.2 fL (ref 78.0–100.0)
Platelets: 275 10*3/uL (ref 150–400)
RBC: 3.83 MIL/uL — ABNORMAL LOW (ref 3.87–5.11)
RDW: 13 % (ref 11.5–15.5)
WBC: 5.4 10*3/uL (ref 4.0–10.5)

## 2016-08-09 MED ORDER — CLINDAMYCIN HCL 300 MG PO CAPS
300.0000 mg | ORAL_CAPSULE | Freq: Once | ORAL | Status: AC
  Administered 2016-08-09: 300 mg via ORAL
  Filled 2016-08-09: qty 1

## 2016-08-09 MED ORDER — CLINDAMYCIN HCL 300 MG PO CAPS: 300.0000 mg | ORAL_CAPSULE | Freq: Four times a day (QID) | ORAL | 0 refills | Status: DC

## 2016-08-09 NOTE — Discharge Instructions (Signed)
It was our pleasure to provide your ER care today - we hope that you feel better.  Keep skin/area very clean.    Elevate legs to help with swelling.  Limit salt intake.  Take antibiotic as prescribed.   Follow up with your doctor in 1 week if symptoms fail to improve/resolve.  Return to ER if worse, spreading redness, severe swelling, high fevers, other concern.

## 2016-08-09 NOTE — ED Triage Notes (Signed)
Per [facility] pt diagnosed with cellulitis to RLE and treated with doxycycline since Wednesday; sent here for evaluation related to no improvement and worsening pain/difficulty walking.

## 2016-08-09 NOTE — ED Provider Notes (Addendum)
[facility] DEPT Provider Note   CSN: [id number] Arrival date & time: 08/09/16  1129     History   Chief Complaint Chief Complaint  Patient presents with  . Cellulitis    HPI Candice Miller is a 83 y.o. female.  Patient c/o right lower leg erythema and soreness for the past few weeks.  It was worse earlier in week, was started on antibiotic, doxycycline 3 days ago - has mildly improved since then, but not resolved. No fever or chills. Denies injury to area. Family indicates prior xrays and u/s were negative.    The history is provided by the patient.    Past Medical History:  Diagnosis Date  . COPD (chronic obstructive pulmonary disease) ([place])   . Diabetes mellitus without complication ([place])   . Hypertension     Patient Active Problem List   Diagnosis Date Noted  . Memory loss 10/22/2012    Past Surgical History:  Procedure Laterality Date  . CHOLECYSTECTOMY      OB History    No data available       Home Medications    Prior to Admission medications   Medication Sig Start Date End Date Taking? Authorizing Provider  albuterol (PROVENTIL HFA;VENTOLIN HFA) 108 (90 Base) MCG/ACT inhaler Inhale 2 puffs into the lungs.    Historical Provider, MD  budesonide-formoterol (SYMBICORT) 80-4.5 MCG/ACT inhaler Inhale 2 puffs into the lungs 2 (two) times daily.    Historical Provider, MD  calcium acetate (PHOSLO) 667 MG capsule Take 150 mg by mouth.    Historical Provider, MD  cholecalciferol (VITAMIN D) 1000 units tablet Take 1,000 Units by mouth daily.    Historical Provider, MD  diltiazem (CARDIZEM CD) 180 MG 24 hr capsule Take 180 mg by mouth daily.    Historical Provider, MD  fexofenadine (ALLEGRA) 180 MG tablet Take 180 mg by mouth daily.    Historical Provider, MD  fluticasone (FLONASE) 50 MCG/ACT nasal spray Place 2 sprays into both nostrils daily.    Historical Provider, MD  gabapentin (NEURONTIN) 300 MG capsule Take 300 mg by mouth.    Historical Provider, MD    gemfibrozil (LOPID) 600 MG tablet Take 600 mg by mouth 2 (two) times daily before a meal.    Historical Provider, MD  glimepiride (AMARYL) 1 MG tablet Take 1 mg by mouth daily with breakfast.    Historical Provider, MD  ipratropium-albuterol (DUONEB) 0.5-2.5 (3) MG/3ML SOLN Take 3 mLs by nebulization.    Historical Provider, MD  l-methylfolate-B6-B12 (METANX) 3-35-2 MG TABS tablet Take 1 tablet by mouth daily.    Historical Provider, MD  lisinopril (PRINIVIL,ZESTRIL) 10 MG tablet Take 10 mg by mouth daily.    Historical Provider, MD  memantine [facility] TITRATION PACK) tablet pack Take 28 mg by mouth See admin instructions. 5 mg/day for =1 week; 5 mg twice daily for =1 week; 15 mg/day given in 5 mg and 10 mg separated doses for =1 week; then 10 mg twice daily    Historical Provider, MD  nitroGLYCERIN (NITRODUR - DOSED IN MG/24 HR) 0.4 mg/hr patch Place 0.4 mg onto the skin daily.    Historical Provider, MD  pseudoephedrine-acetaminophen (TYLENOL SINUS) 30-500 MG TABS tablet Take 81 tablets by mouth every 4 (four) hours as needed.    Historical Provider, MD  raloxifene (EVISTA) 60 MG tablet Take 60 mg by mouth daily.    Historical Provider, MD  ranitidine (ZANTAC) 150 MG tablet Take 150 mg by mouth 2 (two) times daily.  Historical Provider, MD    Family History No family history on file.  Social History Social History  Substance Use Topics  . Smoking status: Never Smoker  . Smokeless tobacco: Never Used  . Alcohol use No     Allergies   Keflex [cephalexin]; Other; and Sulfa antibiotics   Review of Systems Review of Systems  Constitutional: Negative for chills and fever.  Respiratory: Negative for shortness of breath.   Cardiovascular: Negative for chest pain.  Gastrointestinal: Negative for abdominal pain, nausea and vomiting.  Neurological: Negative for weakness.     Physical Exam Updated Vital Signs BP 136/78   Pulse 70   Temp 97.4 F (36.3 C) (Oral)   Resp 15   SpO2  95%   Physical Exam  Constitutional: She appears well-developed and well-nourished. No distress.  HENT:  Mouth/Throat: Oropharynx is clear and moist.  Eyes: Conjunctivae are normal. No scleral icterus.  Neck: Neck supple. No tracheal deviation present.  Cardiovascular: Normal rate, regular rhythm, normal heart sounds and intact distal pulses.   Pulmonary/Chest: Effort normal and breath sounds normal. No respiratory distress.  Abdominal: Normal appearance. She exhibits no distension. There is no tenderness.  Musculoskeletal:  Mild cellulitis right lower leg. Distal pulses palp.   Neurological: She is alert.  Skin: Skin is warm and dry. No rash noted. She is not diaphoretic.  Psychiatric: She has a normal mood and affect.  Nursing note and vitals reviewed.    ED Treatments / Results  Labs (all labs ordered are listed, but only abnormal results are displayed) Results for orders placed or performed during the hospital encounter of 08/09/16  CBC  Result Value Ref Range   WBC 5.4 4.0 - 10.5 K/uL   RBC 3.83 (L) 3.87 - 5.11 MIL/uL   Hemoglobin 11.4 (L) 12.0 - 15.0 g/dL   HCT 35.7 (L) 36.0 - 46.0 %   MCV 93.2 78.0 - 100.0 fL   MCH 29.8 26.0 - 34.0 pg   MCHC 31.9 30.0 - 36.0 g/dL   RDW 13.0 11.5 - 15.5 %   Platelets 275 150 - 400 K/uL  Basic metabolic panel  Result Value Ref Range   Sodium 144 135 - 145 mmol/L   Potassium 3.9 3.5 - 5.1 mmol/L   Chloride 108 101 - 111 mmol/L   CO2 29 22 - 32 mmol/L   Glucose, Bld 94 65 - 99 mg/dL   BUN 16 6 - 20 mg/dL   Creatinine, Ser 0.81 0.44 - 1.00 mg/dL   Calcium 9.1 8.9 - 10.3 mg/dL   GFR calc non Af Amer >60 >60 mL/min   GFR calc Af Amer >60 >60 mL/min   Anion gap 7 5 - 15    EKG  EKG Interpretation None       Radiology No results found.  Procedures Procedures (including critical care time)  Medications Ordered in ED Medications - No data to display   Initial Impression / Assessment and Plan / ED Course  I have reviewed  the triage vital signs and the nursing notes.  Pertinent labs & imaging results that were available during my care of the patient were reviewed by me and considered in my medical decision making (see chart for details).  Family requests lab work be checked.   Reviewed nursing notes and prior charts for additional history.   As symptoms improving, but not resolved, w add additional abx coverage.   clinda po.   Final Clinical Impressions(s) / ED Diagnoses   Final  diagnoses:  None    New Prescriptions New Prescriptions   No medications on file         Lajean Saver, MD 08/09/16 1419

## 2016-08-09 NOTE — ED Notes (Signed)
Bed: WA20 Expected date:  Expected time:  Means of arrival:  Comments: 

## 2016-09-03 DIAGNOSIS — F0280 Dementia in other diseases classified elsewhere without behavioral disturbance: Secondary | ICD-10-CM | POA: Diagnosis not present

## 2016-09-03 DIAGNOSIS — R9402 Abnormal brain scan: Secondary | ICD-10-CM | POA: Diagnosis not present

## 2016-09-03 DIAGNOSIS — R51 Headache: Secondary | ICD-10-CM | POA: Diagnosis not present

## 2016-09-03 DIAGNOSIS — G309 Alzheimer's disease, unspecified: Secondary | ICD-10-CM | POA: Diagnosis not present

## 2016-09-10 DIAGNOSIS — I8310 Varicose veins of unspecified lower extremity with inflammation: Secondary | ICD-10-CM | POA: Diagnosis not present

## 2016-09-10 DIAGNOSIS — Z7984 Long term (current) use of oral hypoglycemic drugs: Secondary | ICD-10-CM | POA: Diagnosis not present

## 2016-09-10 DIAGNOSIS — M810 Age-related osteoporosis without current pathological fracture: Secondary | ICD-10-CM | POA: Diagnosis not present

## 2016-09-10 DIAGNOSIS — E1142 Type 2 diabetes mellitus with diabetic polyneuropathy: Secondary | ICD-10-CM | POA: Diagnosis not present

## 2016-10-15 DIAGNOSIS — R404 Transient alteration of awareness: Secondary | ICD-10-CM | POA: Diagnosis not present

## 2016-10-15 DIAGNOSIS — R42 Dizziness and giddiness: Secondary | ICD-10-CM | POA: Diagnosis not present

## 2016-10-17 DIAGNOSIS — R296 Repeated falls: Secondary | ICD-10-CM | POA: Diagnosis not present

## 2016-12-30 ENCOUNTER — Inpatient Hospital Stay (HOSPITAL_COMMUNITY)
Admission: EM | Admit: 2016-12-30 | Discharge: 2017-01-01 | DRG: 379 | Disposition: A | Discharge status: Home / Self Care | Payer: Medicare Other | Attending: Internal Medicine | Admitting: Internal Medicine

## 2016-12-30 ENCOUNTER — Emergency Department (HOSPITAL_COMMUNITY): Payer: Medicare Other

## 2016-12-30 ENCOUNTER — Encounter (HOSPITAL_COMMUNITY): Payer: Self-pay

## 2016-12-30 DIAGNOSIS — K640 First degree hemorrhoids: Secondary | ICD-10-CM | POA: Diagnosis not present

## 2016-12-30 DIAGNOSIS — K649 Unspecified hemorrhoids: Secondary | ICD-10-CM

## 2016-12-30 DIAGNOSIS — D122 Benign neoplasm of ascending colon: Secondary | ICD-10-CM | POA: Diagnosis present

## 2016-12-30 DIAGNOSIS — R531 Weakness: Secondary | ICD-10-CM | POA: Diagnosis not present

## 2016-12-30 DIAGNOSIS — K625 Hemorrhage of anus and rectum: Secondary | ICD-10-CM | POA: Diagnosis not present

## 2016-12-30 DIAGNOSIS — Z66 Do not resuscitate: Secondary | ICD-10-CM | POA: Diagnosis present

## 2016-12-30 DIAGNOSIS — Z853 Personal history of malignant neoplasm of breast: Secondary | ICD-10-CM

## 2016-12-30 DIAGNOSIS — Z7984 Long term (current) use of oral hypoglycemic drugs: Secondary | ICD-10-CM

## 2016-12-30 DIAGNOSIS — I10 Essential (primary) hypertension: Secondary | ICD-10-CM | POA: Diagnosis not present

## 2016-12-30 DIAGNOSIS — J449 Chronic obstructive pulmonary disease, unspecified: Secondary | ICD-10-CM | POA: Diagnosis not present

## 2016-12-30 DIAGNOSIS — K5730 Diverticulosis of large intestine without perforation or abscess without bleeding: Secondary | ICD-10-CM | POA: Diagnosis present

## 2016-12-30 DIAGNOSIS — E119 Type 2 diabetes mellitus without complications: Secondary | ICD-10-CM | POA: Diagnosis present

## 2016-12-30 DIAGNOSIS — R404 Transient alteration of awareness: Secondary | ICD-10-CM | POA: Diagnosis not present

## 2016-12-30 DIAGNOSIS — Z7982 Long term (current) use of aspirin: Secondary | ICD-10-CM

## 2016-12-30 DIAGNOSIS — R911 Solitary pulmonary nodule: Secondary | ICD-10-CM | POA: Diagnosis present

## 2016-12-30 DIAGNOSIS — K922 Gastrointestinal hemorrhage, unspecified: Secondary | ICD-10-CM | POA: Diagnosis not present

## 2016-12-30 DIAGNOSIS — K921 Melena: Principal | ICD-10-CM | POA: Diagnosis present

## 2016-12-30 DIAGNOSIS — D509 Iron deficiency anemia, unspecified: Secondary | ICD-10-CM | POA: Diagnosis present

## 2016-12-30 DIAGNOSIS — Z23 Encounter for immunization: Secondary | ICD-10-CM

## 2016-12-30 DIAGNOSIS — Z79899 Other long term (current) drug therapy: Secondary | ICD-10-CM

## 2016-12-30 LAB — CBC
HCT: 32.6 % — ABNORMAL LOW (ref 36.0–46.0)
Hemoglobin: 10.8 g/dL — ABNORMAL LOW (ref 12.0–15.0)
MCH: 30.1 pg (ref 26.0–34.0)
MCHC: 33.1 g/dL (ref 30.0–36.0)
MCV: 90.8 fL (ref 78.0–100.0)
Platelets: 252 10*3/uL (ref 150–400)
RBC: 3.59 MIL/uL — AB (ref 3.87–5.11)
RDW: 13 % (ref 11.5–15.5)
WBC: 7.1 10*3/uL (ref 4.0–10.5)

## 2016-12-30 LAB — COMPREHENSIVE METABOLIC PANEL
ALK PHOS: 110 U/L (ref 38–126)
ALT: 14 U/L (ref 14–54)
AST: 30 U/L (ref 15–41)
Albumin: 3.7 g/dL (ref 3.5–5.0)
Anion gap: 10 (ref 5–15)
BILIRUBIN TOTAL: 0.6 mg/dL (ref 0.3–1.2)
BUN: 24 mg/dL — AB (ref 6–20)
CALCIUM: 9.4 mg/dL (ref 8.9–10.3)
CO2: 28 mmol/L (ref 22–32)
CREATININE: 1.1 mg/dL — AB (ref 0.44–1.00)
Chloride: 102 mmol/L (ref 101–111)
GFR, EST AFRICAN AMERICAN: 50 mL/min — AB (ref >60)
GFR, EST NON AFRICAN AMERICAN: 43 mL/min — AB (ref >60)
Glucose, Bld: 152 mg/dL — ABNORMAL HIGH (ref 65–99)
Potassium: 4 mmol/L (ref 3.5–5.1)
Sodium: 140 mmol/L (ref 135–145)
TOTAL PROTEIN: 7.7 g/dL (ref 6.5–8.1)

## 2016-12-30 LAB — URINALYSIS, ROUTINE W REFLEX MICROSCOPIC
BILIRUBIN URINE: NEGATIVE
Bacteria, UA: NONE SEEN
Glucose, UA: NEGATIVE mg/dL
KETONES UR: NEGATIVE mg/dL
LEUKOCYTES UA: NEGATIVE
Nitrite: NEGATIVE
PH: 7 (ref 5.0–8.0)
Protein, ur: NEGATIVE mg/dL
Specific Gravity, Urine: 1.006 (ref 1.005–1.030)

## 2016-12-30 LAB — TYPE AND SCREEN
ABO/RH(D): O NEG
ANTIBODY SCREEN: NEGATIVE

## 2016-12-30 LAB — I-STAT TROPONIN, ED: Troponin i, poc: 0.02 ng/mL (ref 0.00–0.08)

## 2016-12-30 LAB — ABO/RH: ABO/RH(D): O NEG

## 2016-12-30 LAB — POC OCCULT BLOOD, ED: Fecal Occult Bld: POSITIVE — AB

## 2016-12-30 MED ORDER — INFLUENZA VAC SPLIT HIGH-DOSE 0.5 ML IM SUSY
0.5000 mL | PREFILLED_SYRINGE | INTRAMUSCULAR | Status: AC
  Administered 2016-12-31: 0.5 mL via INTRAMUSCULAR
  Filled 2016-12-30: qty 0.5

## 2016-12-30 MED ORDER — SODIUM CHLORIDE 0.9 % IV BOLUS (SEPSIS)
500.0000 mL | Freq: Once | INTRAVENOUS | Status: AC
  Administered 2016-12-30: 500 mL via INTRAVENOUS

## 2016-12-30 NOTE — ED Notes (Signed)
Bed: XU38 Expected date:  Expected time:  Means of arrival:  Comments: EMS- 83yo F, GI bleed

## 2016-12-30 NOTE — H&P (Signed)
History and Physical    Candice Miller DDU:[id number] DOB: 01/13/1925 DOA: 12/30/2016  PCP: System, Pcp Not In   Patient coming from: Home  Chief Complaint: bright red blood per rectum  HPI: Candice Miller is a 83 y.o. female with medical history significant of COPD, DM, HTN, hemorrhoids, breast cancer history- 1996, who presented to the ED with complaints of bright red blood per rectum, with bowel movement today. No prior episodes. Daughter describes it as a moderate amount in the toilet. No prior back stools. No vomiting or hematochezia or nausea, no abdominal pain. Patient endorses very occasional use of ibuprofen for pain, mostly uses Tylenol. She takes a baby aspirin daily. Last colonoscopy, per pts daughter 10-20 yrs ago.  Patient endorses cold by mouth intake, no reported weight los. Patient denied chest pain, no back pain.  With bloody bowel movements patient became weak, disoriented, and dizzy. Patient's daughter called EMS.  ED Course: disorientation improved on arrival to ED. Blood pressure reassuring at 144/58, with normal pulse75. Hemoglobin was 10.8, FOBT- positive. Negative I-STAT troponin. Chest x-ray showed possible 7.5 mm nodule in left upper lobe, anterior wedging of mid thoracic vertebral body, and upper lumbar vertebrae. UA- negative leukocytes bacteria. Urine cultures were drawn in ED. Patient was given 500 ml bolus in ED.  Review of Systems: As per HPI otherwise 10 point review of systems negative.   Past Medical History:  Diagnosis Date  . Cancer ([place])   . COPD (chronic obstructive pulmonary disease) ([place])   . Diabetes mellitus without complication ([place])   . Hypertension     Past Surgical History:  Procedure Laterality Date  . CHOLECYSTECTOMY    . MASTECTOMY    . TUBAL LIGATION       reports that she has never smoked. She has never used smokeless tobacco. She reports that she does not drink alcohol or use drugs.  Allergies  Allergen Reactions  . Keflex  [Cephalexin]   . Other     Pt reports "microdentin"  . Sulfa Antibiotics     Family History  Problem Relation Age of Onset  . Hypertension Mother   . Diabetes Mother   . Heart failure Mother   . Stroke Mother   . Hypertension Father   . Diabetes Father   . Heart failure Father   . Stroke Father     Prior to Admission medications   Medication Sig Start Date End Date Taking? Authorizing Provider  albuterol (PROVENTIL HFA;VENTOLIN HFA) 108 (90 Base) MCG/ACT inhaler Inhale 2 puffs into the lungs.   Yes [provider]  aspirin 81 MG chewable tablet Chew 81 mg by mouth daily.   Yes [provider]  budesonide-formoterol (SYMBICORT) 80-4.5 MCG/ACT inhaler Inhale 2 puffs into the lungs 2 (two) times daily.   Yes [provider]  calcium acetate (PHOSLO) 667 MG capsule Take 150 mg by mouth.   Yes [provider]  cholecalciferol (VITAMIN D) 1000 units tablet Take 1,000 Units by mouth daily.   Yes [provider]  diltiazem (CARDIZEM CD) 180 MG 24 hr capsule Take 180 mg by mouth daily.   Yes [provider]  fexofenadine (ALLEGRA) 180 MG tablet Take 180 mg by mouth daily.   Yes [provider]  fluticasone (FLONASE) 50 MCG/ACT nasal spray Place 2 sprays into both nostrils daily.   Yes [provider]  gabapentin (NEURONTIN) 300 MG capsule Take 300 mg by mouth.   Yes [provider]  gemfibrozil (LOPID) 600 MG  tablet Take 600 mg by mouth 2 (two) times daily before a meal.   Yes [provider]  glimepiride (AMARYL) 1 MG tablet Take 1 mg by mouth daily with breakfast.   Yes [provider]  ipratropium-albuterol (DUONEB) 0.5-2.5 (3) MG/3ML SOLN Take 3 mLs by nebulization.   Yes [provider]  l-methylfolate-B6-B12 (METANX) 3-35-2 MG TABS tablet Take 1 tablet by mouth daily.   Yes [provider]  lisinopril (PRINIVIL,ZESTRIL) 10 MG tablet Take 10 mg by mouth daily.   Yes  [provider]  memantine (NAMENDA) 10 MG tablet Take 10 mg by mouth 2 (two) times daily.   Yes [provider]  ranitidine (ZANTAC) 150 MG tablet Take 150 mg by mouth 2 (two) times daily.   Yes [provider]  clindamycin (CLEOCIN) 300 MG capsule Take 1 capsule (300 mg total) by mouth every 6 (six) hours. Patient not taking: Reported on 12/30/2016 08/09/16   Lajean Saver, MD  nitroGLYCERIN (NITRODUR - DOSED IN MG/24 HR) 0.4 mg/hr patch Place 0.4 mg onto the skin daily.    [provider]    Physical Exam: Vitals:   12/30/16 1930 12/30/16 2000 12/30/16 2030 12/30/16 2100  BP: (!) 139/58 (!) 141/61 (!) 151/64 (!) 143/68  Pulse: 62 62 60 63  Resp: 15 16 19  (!) 26  Temp:      TempSrc:      SpO2: 100% 98% 100% 100%  Weight:      Height:        Constitutional: NAD, calm, comfortable Vitals:   12/30/16 1930 12/30/16 2000 12/30/16 2030 12/30/16 2100  BP: (!) 139/58 (!) 141/61 (!) 151/64 (!) 143/68  Pulse: 62 62 60 63  Resp: 15 16 19  (!) 26  Temp:      TempSrc:      SpO2: 100% 98% 100% 100%  Weight:      Height:       Eyes: PERRL, lids and conjunctivae normal ENMT: Mucous membranes are moist. Posterior pharynx clear of any exudate or lesions.Normal dentition.  Neck: normal, supple, no masses, no thyromegaly Respiratory: clear to auscultation bilaterally, no wheezing, no crackles. Normal respiratory effort. No accessory muscle use.  Cardiovascular: Regular rate and rhythm, no murmurs / rubs / gallops. No extremity edema. 2+ pedal pulses.   Abdomen: no tenderness, no masses palpated. No hepatosplenomegaly. Bowel sounds positive.  Musculoskeletal: no clubbing / cyanosis. No joint deformity upper and lower extremities. Good ROM, no contractures. Normal muscle tone.  Skin: no rashes, lesions, ulcers. No induration Neurologic: CN 2-12 grossly intact. Sensation intact, DTR normal. Strength 5/5 in all 4.  Psychiatric: Normal judgment and insight. Alert  and oriented x 3. Normal mood.   Labs on Admission: I have personally reviewed following labs and imaging studies  CBC:  Recent Labs Lab 12/30/16 1715  WBC 7.1  HGB 10.8*  HCT 32.6*  MCV 90.8  PLT 053   Basic Metabolic Panel:  Recent Labs Lab 12/30/16 1715  NA 140  K 4.0  CL 102  CO2 28  GLUCOSE 152*  BUN 24*  CREATININE 1.10*  CALCIUM 9.4   GFR: Estimated Creatinine Clearance: 33.9 mL/min (A) (by C-G formula based on SCr of 1.1 mg/dL (H)). Liver Function Tests:  Recent Labs Lab 12/30/16 1715  AST 30  ALT 14  ALKPHOS 110  BILITOT 0.6  PROT 7.7  ALBUMIN 3.7  Urine analysis:    Component Value Date/Time   COLORURINE YELLOW 12/30/2016 1810   APPEARANCEUR  CLEAR 12/30/2016 1810   LABSPEC 1.006 12/30/2016 1810   PHURINE 7.0 12/30/2016 1810   GLUCOSEU NEGATIVE 12/30/2016 1810   HGBUR SMALL (A) 12/30/2016 1810   BILIRUBINUR NEGATIVE 12/30/2016 1810   KETONESUR NEGATIVE 12/30/2016 1810   PROTEINUR NEGATIVE 12/30/2016 1810   NITRITE NEGATIVE 12/30/2016 1810   LEUKOCYTESUR NEGATIVE 12/30/2016 1810    Radiological Exams on Admission: Dg Chest 2 View  Result Date: 12/30/2016 CLINICAL DATA:  Bright red blood in stool. EXAM: CHEST  2 VIEW COMPARISON:  None. FINDINGS: No pneumothorax. The heart, hila, and mediastinum are normal. There is a possible nodule in the left upper lung peripherally measuring 7.5 mm. No other nodules or masses. No suspicious infiltrate. The cardiomediastinal silhouette is unremarkable. Anterior wedging of an upper lumbar vertebral body is age indeterminate. Anterior wedging of a midthoracic vertebral body is age indeterminate. IMPRESSION: 1. Possible 7.5 mm nodule in the left upper lobe. This could be compared to previous studies available. Otherwise, a CT scan could further evaluate. 2. Anterior wedging of a midthoracic vertebral body and an upper lumbar vertebral body. These findings are age indeterminate. Recommend clinical correlation. 3. No  other acute abnormalities. Electronically Signed   By: Dorise Bullion III M.D   On: 12/30/2016 18:53    EKG- Ordered in Ed and pending.  Assessment/Plan Principal Problem:   Lower GI bleed Active Problems:   COPD (chronic obstructive pulmonary disease) ([facility])   DM (diabetes mellitus) ([place])   History of breast cancer   HTN (hypertension)   Hemorrhoids   Nodule of left lung  Lower GI bleed- BRBPR. Doubt upper. Hgb- 10.8, mild drop from 11.4 08/09/16. Has hemorrhoids. Last colonoscopy per daughter 10-19yrs ago. Daughter will Like repeat colonoscopy this admission, she is requesting [facility], because she works with [employer]. - CBC every 8 hourly X2 - Consult GI a.m. - clear liquid diet for now  COPD- stable.  O2 sats 93- 100% on RA - continue home Symbicort and DuoNeb's  HTN- blood pressure 144/58. - Cont home diltiazem  DM- CBG 152. home medications glimepiride 1mg  daily. No hemoglobin A1c on file. - SSI  Nodule left lung- chest x-ray shows, Possible 7.5 mm nodule in the left upper lobe. This could be compared to previous studies available. Otherwise, a CT scan could further evaluate. - No imaging on file - Can consider CT imaging prior to discharge or follow-up outpatient   DVT prophylaxis: SCDs Code Status: DO NOT INTUBATE- confirmed with patient and patient's daughter at bedside Family Communication: daughter at bedside Disposition Plan: Home Consults called: GI consulted the morning Admission status: Obs, tele   Bethena Roys MD [facility] Pager [phone]  If 7PM-7AM, please contact night-coverage [email or web address] Password [email or web address]  12/30/2016, 10:11 PM

## 2016-12-30 NOTE — Care Management (Signed)
ED CM reviewed CM consult. Patient is being admitted. Care Management will follow for discharge needs. Arvid Marengo RN CCM  

## 2016-12-30 NOTE — ED Provider Notes (Signed)
[facility] DEPT Provider Note   CSN: [id number] Arrival date & time: 12/30/16  1534     History   Chief Complaint Chief Complaint  Patient presents with  . Rectal Bleeding  . Weakness    HPI Candice Miller is a 83 y.o. female hx of COPD, DM, HTN, Here presenting with bright red blood in the stool. Patient has a history of hemorrhoids and has been using Anusol suppositories as needed. Patient had a normal bowel movement around 3 PM and then subsequently had blood when she wiped. She states that afterwards the whole toilet bowel became red. She then felt weak and tired and subjective shortness of breath. Has hx of COPD but denies fever or worsening cough.   The history is provided by the patient.    Past Medical History:  Diagnosis Date  . Cancer ([place])   . COPD (chronic obstructive pulmonary disease) ([place])   . Diabetes mellitus without complication ([place])   . Hypertension     Patient Active Problem List   Diagnosis Date Noted  . Memory loss 10/22/2012    Past Surgical History:  Procedure Laterality Date  . CHOLECYSTECTOMY    . MASTECTOMY    . TUBAL LIGATION      OB History    No data available       Home Medications    Prior to Admission medications   Medication Sig Start Date End Date Taking? Authorizing Provider  albuterol (PROVENTIL HFA;VENTOLIN HFA) 108 (90 Base) MCG/ACT inhaler Inhale 2 puffs into the lungs.   Yes [provider]  aspirin 81 MG chewable tablet Chew 81 mg by mouth daily.   Yes [provider]  budesonide-formoterol (SYMBICORT) 80-4.5 MCG/ACT inhaler Inhale 2 puffs into the lungs 2 (two) times daily.   Yes [provider]  calcium acetate (PHOSLO) 667 MG capsule Take 150 mg by mouth.   Yes [provider]  cholecalciferol (VITAMIN D) 1000 units tablet Take 1,000 Units by mouth daily.   Yes [provider]  diltiazem (CARDIZEM CD) 180 MG 24 hr capsule Take 180 mg by mouth daily.   Yes [provider]  fexofenadine (ALLEGRA) 180 MG tablet Take 180 mg by mouth daily.   Yes [provider]  fluticasone (FLONASE) 50 MCG/ACT nasal spray Place 2 sprays into both nostrils daily.   Yes [provider]  gabapentin (NEURONTIN) 300 MG capsule Take 300 mg by mouth.   Yes [provider]  gemfibrozil (LOPID) 600 MG tablet Take 600 mg by mouth 2 (two) times daily before a meal.   Yes [provider]  glimepiride (AMARYL) 1 MG tablet Take 1 mg by mouth daily with breakfast.   Yes [provider]  ipratropium-albuterol (DUONEB) 0.5-2.5 (3) MG/3ML SOLN Take 3 mLs by nebulization.   Yes [provider]  l-methylfolate-B6-B12 (METANX) 3-35-2 MG TABS tablet Take 1 tablet by mouth daily.   Yes [provider]  lisinopril (PRINIVIL,ZESTRIL) 10 MG tablet Take 10 mg by mouth daily.   Yes [provider]  memantine (NAMENDA) 10 MG tablet Take 10 mg by mouth 2 (two) times daily.   Yes [provider]  ranitidine (ZANTAC) 150 MG tablet Take 150 mg by mouth 2 (two) times daily.   Yes [provider]  clindamycin (CLEOCIN) 300 MG capsule Take 1 capsule (300 mg total) by mouth every 6 (six) hours. Patient not taking: Reported on 12/30/2016 08/09/16   Lajean Saver, MD  nitroGLYCERIN (NITRODUR - DOSED  IN MG/24 HR) 0.4 mg/hr patch Place 0.4 mg onto the skin daily.    [provider]    Family History Family History  Problem Relation Age of Onset  . Hypertension Mother   . Diabetes Mother   . Heart failure Mother   . Stroke Mother   . Hypertension Father   . Diabetes Father   . Heart failure Father   . Stroke Father     Social History Social History  Substance Use Topics  . Smoking status: Never Smoker  . Smokeless tobacco: Never Used  . Alcohol use No     Allergies   Keflex [cephalexin]; Other; and Sulfa antibiotics   Review of Systems Review of Systems  Gastrointestinal: Positive for blood  in stool.  All other systems reviewed and are negative.    Physical Exam Updated Vital Signs BP (!) 144/58 (BP Location: Right Arm)   Pulse 75   Temp 97.7 F (36.5 C) (Oral)   Resp 14   Ht 5\' 2"  (1.575 m)   Wt 82.6 kg (182 lb)   SpO2 100%   BMI 33.29 kg/m   Physical Exam  Constitutional: She is oriented to person, place, and time. She appears well-developed.  Chronically ill, slightly pale   HENT:  Head: Normocephalic.  Mouth/Throat: Oropharynx is clear and moist.  Eyes: Pupils are equal, round, and reactive to light. Conjunctivae and EOM are normal.  Neck: Normal range of motion. Neck supple.  Cardiovascular: Normal rate, regular rhythm and normal heart sounds.   Pulmonary/Chest: Effort normal and breath sounds normal. No respiratory distress. She has no wheezes. She has no rales.  Abdominal: Soft. Bowel sounds are normal. She exhibits no distension. There is no tenderness. There is no guarding.  Genitourinary:  Genitourinary Comments: Rectal- small hemorrhoids, no obvious thrombosed hemorroid   Musculoskeletal: Normal range of motion.  Neurological: She is alert and oriented to person, place, and time.  Skin: Skin is warm.  Psychiatric: She has a normal mood and affect.  Nursing note and vitals reviewed.    ED Treatments / Results  Labs (all labs ordered are listed, but only abnormal results are displayed) Labs Reviewed  POC OCCULT BLOOD, ED - Abnormal; Notable for the following:       Result Value   Fecal Occult Bld POSITIVE (*)    All other components within normal limits  URINE CULTURE  COMPREHENSIVE METABOLIC PANEL  CBC  URINALYSIS, ROUTINE W REFLEX MICROSCOPIC  I-STAT TROPONIN, ED  TYPE AND SCREEN    EKG  EKG Interpretation None       Radiology No results found.  Procedures Procedures (including critical care time)  Medications Ordered in ED Medications  sodium chloride 0.9 % bolus 500 mL (not administered)     Initial Impression /  Assessment and Plan / ED Course  I have reviewed the triage vital signs and the nursing notes.  Pertinent labs & imaging results that were available during my care of the patient were reviewed by me and considered in my medical decision making (see chart for details).     Sheniece Tarter is a 83 y.o. female here with weakness, rectal bleeding. Hx of hemorrhoids, vitals stable, not tachy or hypotensive. Will check labs, guiac, UA. Will reassess.   8:23 PM Occ positive. Hg 10.8. Unable to stand up but she uses walker at baseline. Offered repeat CBC but daughter is concerned for her safety at home so request admission for observation to trend CBC and observe  for further bleeding.   Final Clinical Impressions(s) / ED Diagnoses   Final diagnoses:  None    New Prescriptions New Prescriptions   No medications on file     Drenda Freeze, MD 12/30/16 2024

## 2016-12-30 NOTE — ED Triage Notes (Signed)
Per EMS- patient is from home. Patient's family noted bright red blood in the stool, generalized weakness, and slight SOB. approx. 0300 today. Patient denies any abdominal pain or vomiting. Rhonchi present bilateral upper lobes. Patient has a history of COPD.

## 2016-12-30 NOTE — ED Notes (Signed)
Attempted to call report, nurse is busy and will call back in 5 minutes

## 2016-12-31 ENCOUNTER — Encounter (HOSPITAL_COMMUNITY): Payer: Self-pay | Admitting: Internal Medicine

## 2016-12-31 DIAGNOSIS — J449 Chronic obstructive pulmonary disease, unspecified: Secondary | ICD-10-CM | POA: Diagnosis present

## 2016-12-31 DIAGNOSIS — Z7982 Long term (current) use of aspirin: Secondary | ICD-10-CM | POA: Diagnosis not present

## 2016-12-31 DIAGNOSIS — E119 Type 2 diabetes mellitus without complications: Secondary | ICD-10-CM

## 2016-12-31 DIAGNOSIS — K625 Hemorrhage of anus and rectum: Secondary | ICD-10-CM | POA: Diagnosis present

## 2016-12-31 DIAGNOSIS — D509 Iron deficiency anemia, unspecified: Secondary | ICD-10-CM | POA: Diagnosis present

## 2016-12-31 DIAGNOSIS — D122 Benign neoplasm of ascending colon: Secondary | ICD-10-CM | POA: Diagnosis present

## 2016-12-31 DIAGNOSIS — Z853 Personal history of malignant neoplasm of breast: Secondary | ICD-10-CM | POA: Diagnosis not present

## 2016-12-31 DIAGNOSIS — K649 Unspecified hemorrhoids: Secondary | ICD-10-CM

## 2016-12-31 DIAGNOSIS — K922 Gastrointestinal hemorrhage, unspecified: Secondary | ICD-10-CM | POA: Diagnosis present

## 2016-12-31 DIAGNOSIS — K921 Melena: Secondary | ICD-10-CM | POA: Diagnosis not present

## 2016-12-31 DIAGNOSIS — Z23 Encounter for immunization: Secondary | ICD-10-CM | POA: Diagnosis not present

## 2016-12-31 DIAGNOSIS — R911 Solitary pulmonary nodule: Secondary | ICD-10-CM | POA: Diagnosis present

## 2016-12-31 DIAGNOSIS — K640 First degree hemorrhoids: Secondary | ICD-10-CM | POA: Diagnosis present

## 2016-12-31 DIAGNOSIS — Z79899 Other long term (current) drug therapy: Secondary | ICD-10-CM | POA: Diagnosis not present

## 2016-12-31 DIAGNOSIS — Z7984 Long term (current) use of oral hypoglycemic drugs: Secondary | ICD-10-CM | POA: Diagnosis not present

## 2016-12-31 DIAGNOSIS — K5730 Diverticulosis of large intestine without perforation or abscess without bleeding: Secondary | ICD-10-CM | POA: Diagnosis present

## 2016-12-31 DIAGNOSIS — Z66 Do not resuscitate: Secondary | ICD-10-CM | POA: Diagnosis present

## 2016-12-31 DIAGNOSIS — I10 Essential (primary) hypertension: Secondary | ICD-10-CM | POA: Diagnosis present

## 2016-12-31 LAB — CBC
HCT: 33.4 % — ABNORMAL LOW (ref 36.0–46.0)
HCT: 32.9 % — ABNORMAL LOW (ref 36.0–46.0)
HEMOGLOBIN: 11 g/dL — AB (ref 12.0–15.0)
HEMOGLOBIN: 10.8 g/dL — AB (ref 12.0–15.0)
MCH: 29.9 pg (ref 26.0–34.0)
MCH: 29.3 pg (ref 26.0–34.0)
MCHC: 32.9 g/dL (ref 30.0–36.0)
MCHC: 32.8 g/dL (ref 30.0–36.0)
MCV: 90.8 fL (ref 78.0–100.0)
MCV: 89.2 fL (ref 78.0–100.0)
Platelets: 248 10*3/uL (ref 150–400)
Platelets: 275 10*3/uL (ref 150–400)
RBC: 3.68 MIL/uL — AB (ref 3.87–5.11)
RBC: 3.69 MIL/uL — ABNORMAL LOW (ref 3.87–5.11)
RDW: 13 % (ref 11.5–15.5)
RDW: 13.1 % (ref 11.5–15.5)
WBC: 7 10*3/uL (ref 4.0–10.5)
WBC: 6.1 10*3/uL (ref 4.0–10.5)

## 2016-12-31 LAB — GLUCOSE, CAPILLARY
Glucose-Capillary: 76 mg/dL (ref 65–99)
Glucose-Capillary: 139 mg/dL — ABNORMAL HIGH (ref 65–99)
Glucose-Capillary: 85 mg/dL (ref 65–99)
Glucose-Capillary: 75 mg/dL (ref 65–99)

## 2016-12-31 LAB — PROTIME-INR
INR: 1.05
PROTHROMBIN TIME: 13.6 s (ref 11.4–15.2)

## 2016-12-31 MED ORDER — DILTIAZEM HCL ER COATED BEADS 180 MG PO CP24
180.0000 mg | ORAL_CAPSULE | Freq: Every day | ORAL | Status: DC
  Administered 2016-12-31: 180 mg via ORAL
  Filled 2016-12-31: qty 1

## 2016-12-31 MED ORDER — INSULIN ASPART 100 UNIT/ML ~~LOC~~ SOLN
0.0000 [IU] | Freq: Three times a day (TID) | SUBCUTANEOUS | Status: DC
  Administered 2016-12-31: 1 [IU] via SUBCUTANEOUS

## 2016-12-31 MED ORDER — MEMANTINE HCL 10 MG PO TABS
10.0000 mg | ORAL_TABLET | Freq: Two times a day (BID) | ORAL | Status: DC
  Administered 2016-12-31 (×2): 10 mg via ORAL
  Filled 2016-12-31 (×2): qty 1

## 2016-12-31 MED ORDER — IPRATROPIUM-ALBUTEROL 0.5-2.5 (3) MG/3ML IN SOLN
3.0000 mL | Freq: Two times a day (BID) | RESPIRATORY_TRACT | Status: DC
  Administered 2016-12-31 – 2017-01-01 (×2): 3 mL via RESPIRATORY_TRACT
  Filled 2016-12-31 (×2): qty 3

## 2016-12-31 MED ORDER — ACETAMINOPHEN 325 MG PO TABS
650.0000 mg | ORAL_TABLET | Freq: Four times a day (QID) | ORAL | Status: DC | PRN
  Administered 2016-12-31 – 2017-01-01 (×3): 650 mg via ORAL
  Filled 2016-12-31 (×3): qty 2

## 2016-12-31 MED ORDER — ONDANSETRON HCL 4 MG PO TABS: 4.0000 mg | ORAL_TABLET | Freq: Four times a day (QID) | ORAL | Status: DC | PRN

## 2016-12-31 MED ORDER — FAMOTIDINE 20 MG PO TABS
10.0000 mg | ORAL_TABLET | Freq: Two times a day (BID) | ORAL | Status: DC
  Administered 2016-12-31 (×2): 10 mg via ORAL
  Filled 2016-12-31 (×2): qty 1

## 2016-12-31 MED ORDER — SODIUM CHLORIDE 0.9 % IV SOLN
INTRAVENOUS | Status: DC
  Administered 2016-12-31: 10 mL/h via INTRAVENOUS

## 2016-12-31 MED ORDER — ACETAMINOPHEN 650 MG RE SUPP: 650.0000 mg | Freq: Four times a day (QID) | RECTAL | Status: DC | PRN

## 2016-12-31 MED ORDER — LISINOPRIL 10 MG PO TABS
10.0000 mg | ORAL_TABLET | Freq: Every day | ORAL | Status: DC
  Administered 2016-12-31: 10 mg via ORAL
  Filled 2016-12-31: qty 1

## 2016-12-31 MED ORDER — IPRATROPIUM-ALBUTEROL 0.5-2.5 (3) MG/3ML IN SOLN
3.0000 mL | Freq: Four times a day (QID) | RESPIRATORY_TRACT | Status: DC | PRN
Start: 2016-12-31 — End: 2017-01-01
  Administered 2016-12-31: 3 mL via RESPIRATORY_TRACT
  Filled 2016-12-31: qty 3

## 2016-12-31 MED ORDER — ALBUTEROL SULFATE (2.5 MG/3ML) 0.083% IN NEBU: 2.5000 mg | INHALATION_SOLUTION | Freq: Three times a day (TID) | RESPIRATORY_TRACT | Status: DC | PRN

## 2016-12-31 MED ORDER — ONDANSETRON HCL 4 MG/2ML IJ SOLN: 4.0000 mg | Freq: Four times a day (QID) | INTRAMUSCULAR | Status: DC | PRN

## 2016-12-31 MED ORDER — FLUTICASONE PROPIONATE 50 MCG/ACT NA SUSP
2.0000 | Freq: Every day | NASAL | Status: DC
  Administered 2016-12-31 – 2017-01-01 (×2): 2 via NASAL
  Filled 2016-12-31: qty 16

## 2016-12-31 MED ORDER — LORATADINE 10 MG PO TABS
10.0000 mg | ORAL_TABLET | Freq: Every day | ORAL | Status: DC
  Administered 2016-12-31: 10 mg via ORAL
  Filled 2016-12-31: qty 1

## 2016-12-31 MED ORDER — GABAPENTIN 300 MG PO CAPS
300.0000 mg | ORAL_CAPSULE | Freq: Every day | ORAL | Status: DC
Start: 2016-12-31 — End: 2017-01-01
  Administered 2016-12-31: 300 mg via ORAL
  Filled 2016-12-31: qty 1

## 2016-12-31 MED ORDER — PEG 3350-KCL-NA BICARB-NACL 420 G PO SOLR
4000.0000 mL | Freq: Once | ORAL | Status: AC
  Administered 2016-12-31: 4000 mL via ORAL

## 2016-12-31 MED ORDER — MOMETASONE FURO-FORMOTEROL FUM 100-5 MCG/ACT IN AERO
2.0000 | INHALATION_SPRAY | Freq: Two times a day (BID) | RESPIRATORY_TRACT | Status: DC
  Administered 2016-12-31 – 2017-01-01 (×3): 2 via RESPIRATORY_TRACT
  Filled 2016-12-31: qty 8.8

## 2016-12-31 MED ORDER — IPRATROPIUM-ALBUTEROL 0.5-2.5 (3) MG/3ML IN SOLN: 3.0000 mL | Freq: Four times a day (QID) | RESPIRATORY_TRACT | Status: DC

## 2016-12-31 NOTE — Progress Notes (Signed)
PROGRESS NOTE  Candice Miller VOH:[id number] DOB: May 30, 1924 DOA: 12/30/2016 PCP: System, Pcp Not In  HPI/Recap of past 24 hours:  No active bleed since in the hospital , she denies pain, no n/v, no fever, daughter at bedside  Assessment/Plan: Principal Problem:   Lower GI bleed Active Problems:   COPD (chronic obstructive pulmonary disease) ([facility])   DM (diabetes mellitus) ([place])   History of breast cancer   HTN (hypertension)   Hemorrhoids   Nodule of left lung   Lower GI bleed- BRBPR. Doubt upper. Hgb- 10.8, mild drop from 11.4 08/09/16. Has hemorrhoids. Last colonoscopy per daughter 10-44yrs ago. Daughter will Like repeat colonoscopy this admission, she is requesting [facility], because she works with [employer]. - CBC every 8 hourly X2 -  clear liquid diet for now -[facility] Home	[place] consulted, case discussed with [facility]  COPD- stable.  O2 sats 93- 100% on RA - continue home Symbicort and DuoNeb's  HTN- blood pressure 144/58. - Cont home diltiazem, lisinopril  noninsulin dependent DM2- CBG 152. home medications glimepiride 1mg  daily. No hemoglobin A1c on file. - SSI  Nodule left lung- chest x-ray shows, Possible 7.5 mm nodule in the left upper lobe. This could be compared to previous studies available. Otherwise, a CT scan could further evaluate. - No imaging on file - Can consider CT imaging prior to discharge or follow-up outpatient   DVT prophylaxis: SCDs Code Status: DO NOT INTUBATE- confirmed with patient and patient's daughter at bedside Family Communication: daughter at bedside Disposition Plan: Home Consults called: [facility]  per daughter's request  Procedures:  Colonoscopy planned on 9/20  Antibiotics:  none   Objective: BP 138/60 (BP Location: Right Arm)   Pulse 66   Temp 98.2 F (36.8 C) (Oral)   Resp 20   Ht 5\' 2"  (1.575 m)   Wt 82.6 kg (182 lb)   SpO2 98%   BMI 33.29 kg/m   Intake/Output Summary (Last 24 hours) at 12/31/16 1922 Last data  filed at 12/31/16 1908  Gross per 24 hour  Intake              600 ml  Output             1125 ml  Net             -525 ml   Filed Weights   12/30/16 1556  Weight: 82.6 kg (182 lb)    Exam: Patient is examined daily including today on 12/31/2016, exams remain the same as of yesterday except that has changed    General:  NAD  Cardiovascular: RRR  Respiratory: CTABL  Abdomen: Soft/ND/NT, positive BS  Musculoskeletal: No Edema  Neuro: alert, oriented   Data Reviewed: Basic Metabolic Panel:  Recent Labs Lab 12/30/16 1715  NA 140  K 4.0  CL 102  CO2 28  GLUCOSE 152*  BUN 24*  CREATININE 1.10*  CALCIUM 9.4   Liver Function Tests:  Recent Labs Lab 12/30/16 1715  AST 30  ALT 14  ALKPHOS 110  BILITOT 0.6  PROT 7.7  ALBUMIN 3.7   No results for input(s): LIPASE, AMYLASE in the last 168 hours. No results for input(s): AMMONIA in the last 168 hours. CBC:  Recent Labs Lab 12/30/16 1715 12/31/16 0208 12/31/16 0946  WBC 7.1 7.0 6.1  HGB 10.8* 11.0* 10.8*  HCT 32.6* 33.4* 32.9*  MCV 90.8 90.8 89.2  PLT 252 248 275   Cardiac Enzymes:   No results for input(s): CKTOTAL,  CKMB, CKMBINDEX, TROPONINI in the last 168 hours. BNP (last 3 results) No results for input(s): BNP in the last 8760 hours.  ProBNP (last 3 results) No results for input(s): PROBNP in the last 8760 hours.  CBG:  Recent Labs Lab 12/31/16 0732 12/31/16 1129 12/31/16 1646  GLUCAP 76 139* 85    No results found for this or any previous visit (from the past 240 hour(s)).   Studies: No results found.  Scheduled Meds: . diltiazem  180 mg Oral Daily  . famotidine  10 mg Oral BID  . fluticasone  2 spray Each Nare Daily  . gabapentin  300 mg Oral QHS  . insulin aspart  0-9 Units Subcutaneous TID WC  . ipratropium-albuterol  3 mL Nebulization BID  . lisinopril  10 mg Oral Daily  . loratadine  10 mg Oral Daily  . memantine  10 mg Oral BID  . mometasone-formoterol  2 puff  Inhalation BID    Continuous Infusions: . sodium chloride 10 mL/hr (12/31/16 1538)     Time spent: 87mins I have personally reviewed and interpreted on  12/31/2016 daily labs, tele strips, imagings as discussed above under data review session and assessment and plans.  I reviewed all nursing notes, pharmacy notes, consultant notes,  vitals, pertinent old records  I have discussed plan of care as described above with RN , patient and family on 12/31/2016   Waynetta Metheny MD, PhD  [facility] Pager [phone]. If 7PM-7AM, please contact night-coverage at [email or web address], password [email or web address] 12/31/2016, 7:22 PM  LOS: 0 days

## 2016-12-31 NOTE — Care Management Note (Signed)
Case Management Note  Patient Details  Name: Candice Miller MRN: [id number] Date of Birth: [birth year]  Subjective/Objective:                  Rectal bleeding  Action/Plan: Date:  December 31, 2016 Chart reviewed for concurrent status and case management needs.  Will continue to follow patient progress.  Discharge Planning: following for needs  Expected discharge date: [id number]  [name], BSN, [place], [place]   Expected Discharge Date:   (unknown)               Expected Discharge Plan:  Home/Self Care  In-House Referral:     Discharge planning Services  CM Consult  Post Acute Care Choice:    Choice offered to:     DME Arranged:    DME Agency:     HH Arranged:    [place] Agency:     Status of Service:  In process, will continue to follow  If discussed at Long Length of Stay Meetings, dates discussed:    Additional Comments:  Leeroy Cha, RN 12/31/2016, 9:19 AM

## 2016-12-31 NOTE — Consult Note (Addendum)
Referring Provider: Dr. Erlinda Hong Primary Care Physician:  System, Pcp Not In Primary Gastroenterologist:  Althia Forts  Reason for Consultation:  Rectal bleeding  HPI: Candice Miller is a 83 y.o. female with a history of breast cancer, DM, HTN, COPD and also a history of hemorrhoids being seen for a consult due to the acute onset of bright red blood per rectum yesterday that happened several times and was moderate to severe in volume. Reports formed brown stool with the bleeding. She had crampy abdominal pain yesterday as well. Felt nauseous without vomiting. Felt weak and dizzy yesterday. She has had occasional blood with wiping in the past that she attributes to her hemorrhoids. No bleeding since admit. Heme positive on admit. Hgb 10.8. Colonoscopy 10-20 years ago but records not available at this time. Daughter reports that Candice Miller's brother had colon cancer in his 91's and one of her nieces (another brother's daughter) had colon cancer in her 33's. Candice Miller is oriented X 3.  Past Medical History:  Diagnosis Date  . Cancer ([place])   . COPD (chronic obstructive pulmonary disease) ([place])   . Diabetes mellitus without complication ([place])   . Hypertension     Past Surgical History:  Procedure Laterality Date  . CHOLECYSTECTOMY    . MASTECTOMY    . TUBAL LIGATION      Prior to Admission medications   Medication Sig Start Date End Date Taking? Authorizing Provider  albuterol (PROVENTIL HFA;VENTOLIN HFA) 108 (90 Base) MCG/ACT inhaler Inhale 2 puffs into the lungs.   Yes [provider]  aspirin 81 MG chewable tablet Chew 81 mg by mouth daily.   Yes [provider]  budesonide-formoterol (SYMBICORT) 80-4.5 MCG/ACT inhaler Inhale 2 puffs into the lungs 2 (two) times daily.   Yes [provider]  calcium acetate (PHOSLO) 667 MG capsule Take 150 mg by mouth.   Yes [provider]  cholecalciferol (VITAMIN D) 1000 units tablet Take 1,000 Units by mouth daily.   Yes  [provider]  diltiazem (CARDIZEM CD) 180 MG 24 hr capsule Take 180 mg by mouth daily.   Yes [provider]  fexofenadine (ALLEGRA) 180 MG tablet Take 180 mg by mouth daily.   Yes [provider]  fluticasone (FLONASE) 50 MCG/ACT nasal spray Place 2 sprays into both nostrils daily.   Yes [provider]  gabapentin (NEURONTIN) 300 MG capsule Take 300 mg by mouth.   Yes [provider]  gemfibrozil (LOPID) 600 MG tablet Take 600 mg by mouth 2 (two) times daily before a meal.   Yes [provider]  glimepiride (AMARYL) 1 MG tablet Take 1 mg by mouth daily with breakfast.   Yes [provider]  ipratropium-albuterol (DUONEB) 0.5-2.5 (3) MG/3ML SOLN Take 3 mLs by nebulization.   Yes [provider]  l-methylfolate-B6-B12 (METANX) 3-35-2 MG TABS tablet Take 1 tablet by mouth daily.   Yes [provider]  lisinopril (PRINIVIL,ZESTRIL) 10 MG tablet Take 10 mg by mouth daily.   Yes [provider]  memantine (NAMENDA) 10 MG tablet Take 10 mg by mouth 2 (two) times daily.   Yes [provider]  ranitidine (ZANTAC) 150 MG tablet Take 150 mg by mouth 2 (two) times daily.   Yes [provider]  clindamycin (CLEOCIN) 300 MG capsule Take 1 capsule (300 mg total) by mouth every 6 (six) hours. Patient not taking: Reported on 12/30/2016 08/09/16   Lajean Saver, MD  nitroGLYCERIN (NITRODUR - DOSED IN MG/24 HR)  0.4 mg/hr patch Place 0.4 mg onto the skin daily.    [provider]    Scheduled Meds: . diltiazem  180 mg Oral Daily  . famotidine  10 mg Oral BID  . fluticasone  2 spray Each Nare Daily  . gabapentin  300 mg Oral QHS  . insulin aspart  0-9 Units Subcutaneous TID WC  . ipratropium-albuterol  3 mL Nebulization BID  . lisinopril  10 mg Oral Daily  . loratadine  10 mg Oral Daily  . memantine  10 mg Oral BID  . mometasone-formoterol  2 puff Inhalation BID  . polyethylene  glycol-electrolytes  4,000 mL Oral Once   Continuous Infusions: . sodium chloride     PRN Meds:.acetaminophen **OR** acetaminophen, albuterol, ipratropium-albuterol, ondansetron **OR** ondansetron (ZOFRAN) IV  Allergies as of 12/30/2016 - Review Complete 12/30/2016  Allergen Reaction Noted  . Keflex [cephalexin]  06/11/2016  . Other  06/11/2016  . Sulfa antibiotics  06/11/2016    Family History  Problem Relation Age of Onset  . Hypertension Mother   . Diabetes Mother   . Heart failure Mother   . Stroke Mother   . Hypertension Father   . Diabetes Father   . Heart failure Father   . Stroke Father     Social History   Social History  . Marital status: Widowed    Spouse name: N/A  . Number of children: N/A  . Years of education: N/A   Occupational History  . Not on file.   Social History Main Topics  . Smoking status: Never Smoker  . Smokeless tobacco: Never Used  . Alcohol use No  . Drug use: No  . Sexual activity: Not on file   Other Topics Concern  . Not on file   Social History Narrative  . No narrative on file    Review of Systems: All negative except as stated above in HPI.  Physical Exam: Vital signs: Vitals:   12/31/16 0900 12/31/16 0957  BP: (!) 132/53   Pulse: 69   Resp: 20   Temp: 97.7 F (36.5 C)   SpO2: 98% 95%   Last BM Date: 12/30/16 General:  Lethargic, elderly, Well-developed, well-nourished, pleasant and cooperative in NAD Head: normocephalic, atraumatic Eyes: anicteric sclera ENT: oropharynx clear Neck: supple, nontender Lungs:  Mild expiratory wheezing, no crackles. Heart:  Regular rate and rhythm; no murmurs, clicks, rubs,  or gallops. Abdomen: soft, nontender, nondistended, +BS  Rectal:  Deferred Ext: no edema  GI:  Lab Results:  Recent Labs  12/30/16 1715 12/31/16 0208 12/31/16 0946  WBC 7.1 7.0 6.1  HGB 10.8* 11.0* 10.8*  HCT 32.6* 33.4* 32.9*  PLT 252 248 275   BMET  Recent Labs  12/30/16 1715  NA 140   K 4.0  CL 102  CO2 28  GLUCOSE 152*  BUN 24*  CREATININE 1.10*  CALCIUM 9.4   LFT  Recent Labs  12/30/16 1715  PROT 7.7  ALBUMIN 3.7  AST 30  ALT 14  ALKPHOS 110  BILITOT 0.6   PT/INR  Recent Labs  12/31/16 0946  LABPROT 13.6  INR 1.05     Studies/Results: Dg Chest 2 View  Result Date: 12/30/2016 CLINICAL DATA:  Bright red blood in stool. EXAM: CHEST  2 VIEW COMPARISON:  None. FINDINGS: No pneumothorax. The heart, hila, and mediastinum are normal. There is a possible nodule in the left upper lung peripherally measuring 7.5 mm. No other nodules or masses. No suspicious infiltrate. The cardiomediastinal  silhouette is unremarkable. Anterior wedging of an upper lumbar vertebral body is age indeterminate. Anterior wedging of a midthoracic vertebral body is age indeterminate. IMPRESSION: 1. Possible 7.5 mm nodule in the left upper lobe. This could be compared to previous studies available. Otherwise, a CT scan could further evaluate. 2. Anterior wedging of a midthoracic vertebral body and an upper lumbar vertebral body. These findings are age indeterminate. Recommend clinical correlation. 3. No other acute abnormalities. Electronically Signed   By: Dorise Bullion III M.D   On: 12/30/2016 18:53    Impression/Plan: 83 yo with acute onset of bright red blood per rectum yesterday with crampy abdominal pain. Question malignancy vs ischemic colitis vs diverticular bleeding. Hemorrhoidal bleeding less likely. Will do a colonoscopy tomorrow with Propofol sedation to evaluate for colonic malignancy. Colon prep this afternoon. Clear liquid diet. NPO p MN. Discussed with patient and daughter and they agree to proceed.    LOS: 0 days   [place] C.  12/31/2016, 12:56 PM  Pager [phone]  AFTER 5 pm or on weekends please call [phone]

## 2017-01-01 ENCOUNTER — Encounter (HOSPITAL_COMMUNITY)
Admission: EM | Disposition: A | Discharge status: Home / Self Care | Payer: Self-pay | Source: Home / Self Care | Attending: Internal Medicine

## 2017-01-01 ENCOUNTER — Encounter (HOSPITAL_COMMUNITY): Payer: Self-pay | Admitting: *Deleted

## 2017-01-01 ENCOUNTER — Inpatient Hospital Stay (HOSPITAL_COMMUNITY): Payer: Medicare Other | Admitting: Certified Registered Nurse Anesthetist

## 2017-01-01 HISTORY — PX: COLONOSCOPY WITH PROPOFOL: SHX5780

## 2017-01-01 LAB — CBC WITH DIFFERENTIAL/PLATELET
Basophils Absolute: 0 10*3/uL (ref 0.0–0.1)
Basophils Relative: 0 %
EOS PCT: 2 %
Eosinophils Absolute: 0.1 10*3/uL (ref 0.0–0.7)
HEMATOCRIT: 33.2 % — AB (ref 36.0–46.0)
Hemoglobin: 11 g/dL — ABNORMAL LOW (ref 12.0–15.0)
LYMPHS PCT: 11 %
Lymphs Abs: 0.6 10*3/uL — ABNORMAL LOW (ref 0.7–4.0)
MCH: 30 pg (ref 26.0–34.0)
MCHC: 33.1 g/dL (ref 30.0–36.0)
MCV: 90.5 fL (ref 78.0–100.0)
MONOS PCT: 5 %
Monocytes Absolute: 0.3 10*3/uL (ref 0.1–1.0)
NEUTROS ABS: 4.6 10*3/uL (ref 1.7–7.7)
Neutrophils Relative %: 82 %
PLATELETS: 259 10*3/uL (ref 150–400)
RBC: 3.67 MIL/uL — ABNORMAL LOW (ref 3.87–5.11)
RDW: 13.1 % (ref 11.5–15.5)
WBC: 5.6 10*3/uL (ref 4.0–10.5)

## 2017-01-01 LAB — HEMOGLOBIN A1C
Hgb A1c MFr Bld: 6.3 % — ABNORMAL HIGH (ref 4.8–5.6)
Mean Plasma Glucose: 134.11 mg/dL

## 2017-01-01 LAB — MAGNESIUM: Magnesium: 1.7 mg/dL (ref 1.7–2.4)

## 2017-01-01 LAB — GLUCOSE, CAPILLARY
Glucose-Capillary: 89 mg/dL (ref 65–99)
Glucose-Capillary: 76 mg/dL (ref 65–99)
Glucose-Capillary: 118 mg/dL — ABNORMAL HIGH (ref 65–99)
Glucose-Capillary: 71 mg/dL (ref 65–99)

## 2017-01-01 LAB — URINE CULTURE

## 2017-01-01 LAB — TSH: TSH: 2.458 u[IU]/mL (ref 0.350–4.500)

## 2017-01-01 LAB — BASIC METABOLIC PANEL
ANION GAP: 11 (ref 5–15)
BUN: 13 mg/dL (ref 6–20)
CALCIUM: 9.1 mg/dL (ref 8.9–10.3)
CO2: 26 mmol/L (ref 22–32)
Chloride: 103 mmol/L (ref 101–111)
Creatinine, Ser: 0.95 mg/dL (ref 0.44–1.00)
GFR calc Af Amer: 59 mL/min — ABNORMAL LOW (ref >60)
GFR calc non Af Amer: 51 mL/min — ABNORMAL LOW (ref >60)
GLUCOSE: 91 mg/dL (ref 65–99)
POTASSIUM: 4 mmol/L (ref 3.5–5.1)
Sodium: 140 mmol/L (ref 135–145)

## 2017-01-01 LAB — VITAMIN B12: VITAMIN B 12: 2773 pg/mL — AB (ref 180–914)

## 2017-01-01 LAB — RETICULOCYTES
RBC.: 3.67 MIL/uL — AB (ref 3.87–5.11)
RETIC CT PCT: 1.1 % (ref 0.4–3.1)
Retic Count, Absolute: 40.4 10*3/uL (ref 19.0–186.0)

## 2017-01-01 LAB — IRON AND TIBC
Iron: 69 ug/dL (ref 28–170)
Saturation Ratios: 15 % (ref 10.4–31.8)
TIBC: 451 ug/dL — ABNORMAL HIGH (ref 250–450)
UIBC: 382 ug/dL

## 2017-01-01 LAB — FOLATE: FOLATE: 72 ng/mL (ref >5.9)

## 2017-01-01 SURGERY — COLONOSCOPY WITH PROPOFOL
Anesthesia: Monitor Anesthesia Care

## 2017-01-01 MED ORDER — PHENYLEPHRINE 40 MCG/ML (10ML) SYRINGE FOR IV PUSH (FOR BLOOD PRESSURE SUPPORT)
PREFILLED_SYRINGE | INTRAVENOUS | Status: DC | PRN
  Administered 2017-01-01 (×3): 80 ug via INTRAVENOUS

## 2017-01-01 MED ORDER — PROPOFOL 500 MG/50ML IV EMUL
INTRAVENOUS | Status: DC | PRN
  Administered 2017-01-01: 35 ug/kg/min via INTRAVENOUS

## 2017-01-01 MED ORDER — POLYETHYLENE GLYCOL 3350 17 G PO PACK: 17.0000 g | PACK | Freq: Two times a day (BID) | ORAL | Status: DC

## 2017-01-01 MED ORDER — PROPOFOL 10 MG/ML IV BOLUS
INTRAVENOUS | Status: DC | PRN
  Administered 2017-01-01 (×4): 10 mg via INTRAVENOUS

## 2017-01-01 MED ORDER — DEXAMETHASONE SODIUM PHOSPHATE 10 MG/ML IJ SOLN
INTRAMUSCULAR | Status: AC
  Filled 2017-01-01: qty 1

## 2017-01-01 MED ORDER — LIDOCAINE 2% (20 MG/ML) 5 ML SYRINGE
INTRAMUSCULAR | Status: AC
  Filled 2017-01-01: qty 5

## 2017-01-01 MED ORDER — ONDANSETRON HCL 4 MG/2ML IJ SOLN
INTRAMUSCULAR | Status: DC | PRN
  Administered 2017-01-01: 4 mg via INTRAVENOUS

## 2017-01-01 MED ORDER — PHENYLEPHRINE 40 MCG/ML (10ML) SYRINGE FOR IV PUSH (FOR BLOOD PRESSURE SUPPORT)
PREFILLED_SYRINGE | INTRAVENOUS | Status: AC
  Filled 2017-01-01: qty 10

## 2017-01-01 MED ORDER — PROPOFOL 10 MG/ML IV BOLUS
INTRAVENOUS | Status: AC
  Filled 2017-01-01: qty 40

## 2017-01-01 MED ORDER — LIDOCAINE 2% (20 MG/ML) 5 ML SYRINGE
INTRAMUSCULAR | Status: DC | PRN
  Administered 2017-01-01: 50 mg via INTRAVENOUS

## 2017-01-01 MED ORDER — POLYETHYLENE GLYCOL 3350 17 G PO PACK: 17.0000 g | PACK | Freq: Every day | ORAL | 0 refills | Status: DC

## 2017-01-01 MED ORDER — ONDANSETRON HCL 4 MG/2ML IJ SOLN
INTRAMUSCULAR | Status: AC
  Filled 2017-01-01: qty 2

## 2017-01-01 SURGICAL SUPPLY — 21 items

## 2017-01-01 NOTE — H&P (View-Only) (Signed)
Referring Provider: Dr. Erlinda Hong Primary Care Physician:  System, Pcp Not In Primary Gastroenterologist:  Althia Forts  Reason for Consultation:  Rectal bleeding  HPI: Candice Miller is a 83 y.o. female with a history of breast cancer, DM, HTN, COPD and also a history of hemorrhoids being seen for a consult due to the acute onset of bright red blood per rectum yesterday that happened several times and was moderate to severe in volume. Reports formed brown stool with the bleeding. She had crampy abdominal pain yesterday as well. Felt nauseous without vomiting. Felt weak and dizzy yesterday. She has had occasional blood with wiping in the past that she attributes to her hemorrhoids. No bleeding since admit. Heme positive on admit. Hgb 10.8. Colonoscopy 10-20 years ago but records not available at this time. Daughter reports that Candice Miller's brother had colon cancer in his 53's and one of her nieces (another brother's daughter) had colon cancer in her 4's. Candice Miller is oriented X 3.  Past Medical History:  Diagnosis Date  . Cancer ([place])   . COPD (chronic obstructive pulmonary disease) ([place])   . Diabetes mellitus without complication ([place])   . Hypertension     Past Surgical History:  Procedure Laterality Date  . CHOLECYSTECTOMY    . MASTECTOMY    . TUBAL LIGATION      Prior to Admission medications   Medication Sig Start Date End Date Taking? Authorizing Provider  albuterol (PROVENTIL HFA;VENTOLIN HFA) 108 (90 Base) MCG/ACT inhaler Inhale 2 puffs into the lungs.   Yes [provider]  aspirin 81 MG chewable tablet Chew 81 mg by mouth daily.   Yes [provider]  budesonide-formoterol (SYMBICORT) 80-4.5 MCG/ACT inhaler Inhale 2 puffs into the lungs 2 (two) times daily.   Yes [provider]  calcium acetate (PHOSLO) 667 MG capsule Take 150 mg by mouth.   Yes [provider]  cholecalciferol (VITAMIN D) 1000 units tablet Take 1,000 Units by mouth daily.   Yes  [provider]  diltiazem (CARDIZEM CD) 180 MG 24 hr capsule Take 180 mg by mouth daily.   Yes [provider]  fexofenadine (ALLEGRA) 180 MG tablet Take 180 mg by mouth daily.   Yes [provider]  fluticasone (FLONASE) 50 MCG/ACT nasal spray Place 2 sprays into both nostrils daily.   Yes [provider]  gabapentin (NEURONTIN) 300 MG capsule Take 300 mg by mouth.   Yes [provider]  gemfibrozil (LOPID) 600 MG tablet Take 600 mg by mouth 2 (two) times daily before a meal.   Yes [provider]  glimepiride (AMARYL) 1 MG tablet Take 1 mg by mouth daily with breakfast.   Yes [provider]  ipratropium-albuterol (DUONEB) 0.5-2.5 (3) MG/3ML SOLN Take 3 mLs by nebulization.   Yes [provider]  l-methylfolate-B6-B12 (METANX) 3-35-2 MG TABS tablet Take 1 tablet by mouth daily.   Yes [provider]  lisinopril (PRINIVIL,ZESTRIL) 10 MG tablet Take 10 mg by mouth daily.   Yes [provider]  memantine (NAMENDA) 10 MG tablet Take 10 mg by mouth 2 (two) times daily.   Yes [provider]  ranitidine (ZANTAC) 150 MG tablet Take 150 mg by mouth 2 (two) times daily.   Yes [provider]  clindamycin (CLEOCIN) 300 MG capsule Take 1 capsule (300 mg total) by mouth every 6 (six) hours. Patient not taking: Reported on 12/30/2016 08/09/16   Lajean Saver, MD  nitroGLYCERIN (NITRODUR - DOSED IN MG/24 HR)  0.4 mg/hr patch Place 0.4 mg onto the skin daily.    [provider]    Scheduled Meds: . diltiazem  180 mg Oral Daily  . famotidine  10 mg Oral BID  . fluticasone  2 spray Each Nare Daily  . gabapentin  300 mg Oral QHS  . insulin aspart  0-9 Units Subcutaneous TID WC  . ipratropium-albuterol  3 mL Nebulization BID  . lisinopril  10 mg Oral Daily  . loratadine  10 mg Oral Daily  . memantine  10 mg Oral BID  . mometasone-formoterol  2 puff Inhalation BID  . polyethylene  glycol-electrolytes  4,000 mL Oral Once   Continuous Infusions: . sodium chloride     PRN Meds:.acetaminophen **OR** acetaminophen, albuterol, ipratropium-albuterol, ondansetron **OR** ondansetron (ZOFRAN) IV  Allergies as of 12/30/2016 - Review Complete 12/30/2016  Allergen Reaction Noted  . Keflex [cephalexin]  06/11/2016  . Other  06/11/2016  . Sulfa antibiotics  06/11/2016    Family History  Problem Relation Age of Onset  . Hypertension Mother   . Diabetes Mother   . Heart failure Mother   . Stroke Mother   . Hypertension Father   . Diabetes Father   . Heart failure Father   . Stroke Father     Social History   Social History  . Marital status: Widowed    Spouse name: N/A  . Number of children: N/A  . Years of education: N/A   Occupational History  . Not on file.   Social History Main Topics  . Smoking status: Never Smoker  . Smokeless tobacco: Never Used  . Alcohol use No  . Drug use: No  . Sexual activity: Not on file   Other Topics Concern  . Not on file   Social History Narrative  . No narrative on file    Review of Systems: All negative except as stated above in HPI.  Physical Exam: Vital signs: Vitals:   12/31/16 0900 12/31/16 0957  BP: (!) 132/53   Pulse: 69   Resp: 20   Temp: 97.7 F (36.5 C)   SpO2: 98% 95%   Last BM Date: 12/30/16 General:  Lethargic, elderly, Well-developed, well-nourished, pleasant and cooperative in NAD Head: normocephalic, atraumatic Eyes: anicteric sclera ENT: oropharynx clear Neck: supple, nontender Lungs:  Mild expiratory wheezing, no crackles. Heart:  Regular rate and rhythm; no murmurs, clicks, rubs,  or gallops. Abdomen: soft, nontender, nondistended, +BS  Rectal:  Deferred Ext: no edema  GI:  Lab Results:  Recent Labs  12/30/16 1715 12/31/16 0208 12/31/16 0946  WBC 7.1 7.0 6.1  HGB 10.8* 11.0* 10.8*  HCT 32.6* 33.4* 32.9*  PLT 252 248 275   BMET  Recent Labs  12/30/16 1715  NA 140   K 4.0  CL 102  CO2 28  GLUCOSE 152*  BUN 24*  CREATININE 1.10*  CALCIUM 9.4   LFT  Recent Labs  12/30/16 1715  PROT 7.7  ALBUMIN 3.7  AST 30  ALT 14  ALKPHOS 110  BILITOT 0.6   PT/INR  Recent Labs  12/31/16 0946  LABPROT 13.6  INR 1.05     Studies/Results: Dg Chest 2 View  Result Date: 12/30/2016 CLINICAL DATA:  Bright red blood in stool. EXAM: CHEST  2 VIEW COMPARISON:  None. FINDINGS: No pneumothorax. The heart, hila, and mediastinum are normal. There is a possible nodule in the left upper lung peripherally measuring 7.5 mm. No other nodules or masses. No suspicious infiltrate. The cardiomediastinal  silhouette is unremarkable. Anterior wedging of an upper lumbar vertebral body is age indeterminate. Anterior wedging of a midthoracic vertebral body is age indeterminate. IMPRESSION: 1. Possible 7.5 mm nodule in the left upper lobe. This could be compared to previous studies available. Otherwise, a CT scan could further evaluate. 2. Anterior wedging of a midthoracic vertebral body and an upper lumbar vertebral body. These findings are age indeterminate. Recommend clinical correlation. 3. No other acute abnormalities. Electronically Signed   By: Dorise Bullion III M.D   On: 12/30/2016 18:53    Impression/Plan: 83 yo with acute onset of bright red blood per rectum yesterday with crampy abdominal pain. Question malignancy vs ischemic colitis vs diverticular bleeding. Hemorrhoidal bleeding less likely. Will do a colonoscopy tomorrow with Propofol sedation to evaluate for colonic malignancy. Colon prep this afternoon. Clear liquid diet. NPO p MN. Discussed with patient and daughter and they agree to proceed.    LOS: 0 days   [place] C.  12/31/2016, 12:56 PM  Pager [phone]  AFTER 5 pm or on weekends please call [phone]

## 2017-01-01 NOTE — Progress Notes (Signed)
[id number]. Pt denies symptoms. Pt done with procedure and back on regular diet. Ginger ale given to pt to sip on . Floor nurse made aware and will recheck.

## 2017-01-01 NOTE — Discharge Summary (Addendum)
Discharge Summary  Candice Miller HQI:[id number] DOB: 10/30/1924  PCP: System, Pcp Not In  Admit date: 12/30/2016 Discharge date: 01/01/2017  Time spent: <77mins  Recommendations for Outpatient Follow-up:  1. F/u with PMD within a week  for hospital discharge follow up, repeat cbc/bmp at follow up 2. F/u with [facility] prn 3. Case manager to arrange home health  Discharge Diagnoses:  Active Hospital Problems   Diagnosis Date Noted  . Lower GI bleed 12/30/2016  . COPD (chronic obstructive pulmonary disease) ([place]) 12/31/2016  . DM (diabetes mellitus) ([place]) 12/31/2016  . History of breast cancer 12/31/2016  . HTN (hypertension) 12/31/2016  . Hemorrhoids 12/31/2016  . Nodule of left lung 12/31/2016  . GI bleed 12/31/2016    Resolved Hospital Problems   Diagnosis Date Noted Date Resolved  No resolved problems to display.    Discharge Condition: stable  Diet recommendation: heart healthy/carb modified  Filed Weights   12/30/16 1556 01/01/17 1251  Weight: 82.6 kg (182 lb) 82.6 kg (182 lb)    History of present illness:  Patient coming from: Home  Chief Complaint: bright red blood per rectum  HPI: Candice Miller is a 83 y.o. female with medical history significant of COPD, DM, HTN, hemorrhoids, breast cancer history- 1996, who presented to the ED with complaints of bright red blood per rectum, with bowel movement today. No prior episodes. Daughter describes it as a moderate amount in the toilet. No prior back stools. No vomiting or hematochezia or nausea, no abdominal pain. Patient endorses very occasional use of ibuprofen for pain, mostly uses Tylenol. She takes a baby aspirin daily. Last colonoscopy, per pts daughter 10-20 yrs ago.  Patient endorses cold by mouth intake, no reported weight los. Patient denied chest pain, no back pain.  With bloody bowel movements patient became weak, disoriented, and dizzy. Patient's daughter called EMS.  ED Course: disorientation  improved on arrival to ED. Blood pressure reassuring at 144/58, with normal pulse75. Hemoglobin was 10.8, FOBT- positive. Negative I-STAT troponin. Chest x-ray showed possible 7.5 mm nodule in left upper lobe, anterior wedging of mid thoracic vertebral body, and upper lumbar vertebrae. UA- negative leukocytes bacteria. Urine cultures were drawn in ED. Patient was given 500 ml bolus in ED.   Hospital Course:  Principal Problem:   Lower GI bleed Active Problems:   COPD (chronic obstructive pulmonary disease) ([facility])   DM (diabetes mellitus) ([place])   History of breast cancer   HTN (hypertension)   Hemorrhoids   Nodule of left lung   GI bleed   Lower GI bleed- BRBPR. Doubt upper. Hgb- 10.8, mild drop from 11.4 08/09/16. Has hemorrhoids. Last colonoscopy per daughter 10-67yrs ago. Daughter will Like repeat colonoscopy this admission, she is requesting [facility], because she works with [employer]. - CBC stable, no more bleed since in the hospital -  [facility] consulted, colonoscopy on 9/20 with diverticula and hemorrhoids, she is cleared to discharge home by GI. -avoid constipation.  COPD- stable. O2 sats 93- 100% on RA - continue home Symbicort and DuoNeb's  HTN- blood pressure 144/58. - Cont home diltiazem, lisinopril  noninsulin dependent DM2-CBG 152. home medications glimepiride 1mg  daily.  hemoglobin A1c 6.1. - SSI  Nodule left lung-chest x-ray shows, Possible 7.5 mm nodule in the left upper lobe. This could be compared to previous studies available. Otherwise, a CT scan could further evaluate. - No imaging on file - Can consider CT imaging prior to discharge or follow-up outpatient   DVT prophylaxis:SCDs Code  Status:DO NOT INTUBATE- confirmed with patient and patient's daughter at bedside Family Communication:daughter at bedside Disposition Plan:Home Consults called:[facility]  per daughter's request  Procedures:  Colonoscopy  on  9/20  Antibiotics:  none   Discharge Exam: BP (!) 132/59 (BP Location: Right Arm)   Pulse 79   Temp 98.4 F (36.9 C) (Oral)   Resp 16   Ht 5\' 2"  (1.575 m)   Wt 82.6 kg (182 lb)   SpO2 97%   BMI 33.29 kg/m   General: NAD Cardiovascular: RRR Respiratory: CTABL  Discharge Instructions You were cared for by a hospitalist during your hospital stay. If you have any questions about your discharge medications or the care you received while you were in the hospital after you are discharged, you can call the unit and asked to speak with the hospitalist on call if the hospitalist that took care of you is not available. Once you are discharged, your primary care physician will handle any further medical issues. Please note that NO REFILLS for any discharge medications will be authorized once you are discharged, as it is imperative that you return to your primary care physician (or establish a relationship with a primary care physician if you do not have one) for your aftercare needs so that they can reassess your need for medications and monitor your lab values.  Discharge Instructions    Diet - low sodium heart healthy    Complete by:  As directed    Carb modified   Face-to-face encounter (required for Medicare/Medicaid patients)    Complete by:  As directed    I Hajer Dwyer certify that this patient is under my care and that I, or a nurse practitioner or physician's assistant working with me, had a face-to-face encounter that meets the physician face-to-face encounter requirements with this patient on 01/01/2017. The encounter with the patient was in whole, or in part for the following medical condition(s) which is the primary reason for home health care (List medical condition): FTT   The encounter with the patient was in whole, or in part, for the following medical condition, which is the primary reason for home health care:  FTT   I certify that, based on my findings, the following services  are medically necessary home health services:   Nursing Physical therapy     Reason for Medically Necessary Home Health Services:  Skilled Nursing- Change/Decline in Patient Status   My clinical findings support the need for the above services:  OTHER SEE COMMENTS   Further, I certify that my clinical findings support that this patient is homebound due to:  Ambulates short distances less than 300 feet   Home Health    Complete by:  As directed    To provide the following care/treatments:   PT RN     Increase activity slowly    Complete by:  As directed      Allergies as of 01/01/2017      Reactions   Keflex [cephalexin]    Other    Pt reports "microdentin"   Sulfa Antibiotics       Medication List    STOP taking these medications   clindamycin 300 MG capsule Commonly known as:  CLEOCIN     TAKE these medications   albuterol 108 (90 Base) MCG/ACT inhaler Commonly known as:  PROVENTIL HFA;VENTOLIN HFA Inhale 2 puffs into the lungs.   aspirin 81 MG chewable tablet Chew 81 mg by mouth daily.   budesonide-formoterol 80-4.5 MCG/ACT  inhaler Commonly known as:  SYMBICORT Inhale 2 puffs into the lungs 2 (two) times daily.   calcium acetate 667 MG capsule Commonly known as:  PHOSLO Take 150 mg by mouth.   cholecalciferol 1000 units tablet Commonly known as:  VITAMIN D Take 1,000 Units by mouth daily.   diltiazem 180 MG 24 hr capsule Commonly known as:  CARDIZEM CD Take 180 mg by mouth daily.   fexofenadine 180 MG tablet Commonly known as:  ALLEGRA Take 180 mg by mouth daily.   fluticasone 50 MCG/ACT nasal spray Commonly known as:  FLONASE Place 2 sprays into both nostrils daily.   gabapentin 300 MG capsule Commonly known as:  NEURONTIN Take 300 mg by mouth.   gemfibrozil 600 MG tablet Commonly known as:  LOPID Take 600 mg by mouth 2 (two) times daily before a meal.   glimepiride 1 MG tablet Commonly known as:  AMARYL Take 1 mg by mouth daily with  breakfast.   ipratropium-albuterol 0.5-2.5 (3) MG/3ML Soln Commonly known as:  DUONEB Take 3 mLs by nebulization.   l-methylfolate-B6-B12 3-35-2 MG Tabs tablet Commonly known as:  METANX Take 1 tablet by mouth daily.   lisinopril 10 MG tablet Commonly known as:  PRINIVIL,ZESTRIL Take 10 mg by mouth daily.   memantine 10 MG tablet Commonly known as:  NAMENDA Take 10 mg by mouth 2 (two) times daily.   nitroGLYCERIN 0.4 mg/hr patch Commonly known as:  NITRODUR - Dosed in mg/24 hr Place 0.4 mg onto the skin daily.   polyethylene glycol packet Commonly known as:  MIRALAX / GLYCOLAX Take 17 g by mouth daily.   ranitidine 150 MG tablet Commonly known as:  ZANTAC Take 150 mg by mouth 2 (two) times daily.            Discharge Care Instructions        Start     Ordered   01/01/17 0000  Increase activity slowly     01/01/17 1522   01/01/17 0000  Diet - low sodium heart healthy     01/01/17 1522   01/01/17 0000  polyethylene glycol (MIRALAX / GLYCOLAX) packet  Daily     01/01/17 1528   01/01/17 0000  Home Health  (Home health needs / face to face )    Question Answer Comment  To provide the following care/treatments PT   To provide the following care/treatments RN      01/01/17 1620   01/01/17 0000  Face-to-face encounter (required for Medicare/Medicaid patients)  (Home health needs / face to face )    Comments:  I Timaya Bojarski certify that this patient is under my care and that I, or a nurse practitioner or physician's assistant working with me, had a face-to-face encounter that meets the physician face-to-face encounter requirements with this patient on 01/01/2017. The encounter with the patient was in whole, or in part for the following medical condition(s) which is the primary reason for home health care (List medical condition): FTT  Question Answer Comment  The encounter with the patient was in whole, or in part, for the following medical condition, which is the primary  reason for home health care FTT   I certify that, based on my findings, the following services are medically necessary home health services Nursing   I certify that, based on my findings, the following services are medically necessary home health services Physical therapy   Reason for Medically [facility]- Change/Decline in Patient Status  My clinical findings support the need for the above services OTHER SEE COMMENTS   Further, I certify that my clinical findings support that this patient is homebound due to: Ambulates short distances less than 300 feet      01/01/17 1620     Allergies  Allergen Reactions  . Keflex [Cephalexin]   . Other     Pt reports "microdentin"  . Sulfa Antibiotics    Follow-up Information    follow up with primary care doctor for hosptial discharge follow up Follow up in 2 week(s).   Why:  consider ct cheat for Possible 7.5 mm nodule in the left upper lobe on chest x ray.       [facility].   Why:  as needed Contact information: [place] [address] [phone]            The results of significant diagnostics from this hospitalization (including imaging, microbiology, ancillary and laboratory) are listed below for reference.    Significant Diagnostic Studies: Dg Chest 2 View  Result Date: 12/30/2016 CLINICAL DATA:  Bright red blood in stool. EXAM: CHEST  2 VIEW COMPARISON:  None. FINDINGS: No pneumothorax. The heart, hila, and mediastinum are normal. There is a possible nodule in the left upper lung peripherally measuring 7.5 mm. No other nodules or masses. No suspicious infiltrate. The cardiomediastinal silhouette is unremarkable. Anterior wedging of an upper lumbar vertebral body is age indeterminate. Anterior wedging of a midthoracic vertebral body is age indeterminate. IMPRESSION: 1. Possible 7.5 mm nodule in the left upper lobe. This could be compared to  previous studies available. Otherwise, a CT scan could further evaluate. 2. Anterior wedging of a midthoracic vertebral body and an upper lumbar vertebral body. These findings are age indeterminate. Recommend clinical correlation. 3. No other acute abnormalities. Electronically Signed   By: Dorise Bullion III M.D   On: 12/30/2016 18:53    Microbiology: Recent Results (from the past 240 hour(s))  Urine culture     Status: Abnormal   Collection Time: 12/30/16  6:10 PM  Result Value Ref Range Status   Specimen Description URINE, RANDOM  Final   Special Requests NONE  Final   Culture MULTIPLE SPECIES PRESENT, SUGGEST RECOLLECTION (A)  Final   Report Status 01/01/2017 FINAL  Final     Labs: Basic Metabolic Panel:  Recent Labs Lab 12/30/16 1715 01/01/17 0625  NA 140 140  K 4.0 4.0  CL 102 103  CO2 28 26  GLUCOSE 152* 91  BUN 24* 13  CREATININE 1.10* 0.95  CALCIUM 9.4 9.1  MG  --  1.7   Liver Function Tests:  Recent Labs Lab 12/30/16 1715  AST 30  ALT 14  ALKPHOS 110  BILITOT 0.6  PROT 7.7  ALBUMIN 3.7   No results for input(s): LIPASE, AMYLASE in the last 168 hours. No results for input(s): AMMONIA in the last 168 hours. CBC:  Recent Labs Lab 12/30/16 1715 12/31/16 0208 12/31/16 0946 01/01/17 0625  WBC 7.1 7.0 6.1 5.6  NEUTROABS  --   --   --  4.6  HGB 10.8* 11.0* 10.8* 11.0*  HCT 32.6* 33.4* 32.9* 33.2*  MCV 90.8 90.8 89.2 90.5  PLT 252 248 275 259   Cardiac Enzymes: No results for input(s): CKTOTAL, CKMB, CKMBINDEX, TROPONINI in the last 168 hours. BNP: BNP (last 3 results) No results for input(s): BNP in the last 8760 hours.  ProBNP (last 3 results) No results for  input(s): PROBNP in the last 8760 hours.  CBG:  Recent Labs Lab 12/31/16 2211 01/01/17 0720 01/01/17 1141 01/01/17 1430 01/01/17 1521  GLUCAP 75 89 76 71 118*       Signed:  Keng Jewel MD, PhD  [facility] 01/01/2017, 5:04 PM

## 2017-01-01 NOTE — Evaluation (Signed)
Physical Therapy One Time Evaluation Patient Details Name: Candice Miller MRN: [id number] DOB: 10-Feb-1925 Today's Date: 01/01/2017   History of Present Illness  Pt is a 83 year old female admitted with GI bleed. Colonoscopy showed hemorrhoids and diverticulosis with a small polyp 2 to 3 mm   Clinical Impression  Patient evaluated by Physical Therapy with no further acute PT needs identified. All education has been completed and the patient has no further questions.  Pt assisted to [facility] and then ambulated 10 feet in room.  Pt reports increased weakness.  Daughter present and states she is taking pt home; they have w/c, RW, and BSC. Recommended HHPT to assist with mobility and strengthening however daughter believes food and a good night's sleep will be helpful. Pt set to d/c home today per staff. PT is signing off. Thank you for this referral.     Follow Up Recommendations Home health PT;Supervision for mobility/OOB    Equipment Recommendations  None recommended by PT    Recommendations for Other Services       Precautions / Restrictions Precautions Precautions: Fall      Mobility  Bed Mobility Overal bed mobility: Needs Assistance Bed Mobility: Supine to Sit     Supine to sit: Min guard     General bed mobility comments: very slow to mobilize, reports increased weakness however no physical assist required  Transfers Overall transfer level: Needs assistance Equipment used: Rolling walker (2 wheeled) Transfers: Sit to/from [facility] Sit to Stand: Min guard Stand pivot transfers: Min guard       General transfer comment: min/guard for safety, pt requested BSC, able to perform hygiene with set up  Ambulation/Gait Ambulation/Gait assistance: Min guard Ambulation Distance (Feet): 10 Feet Assistive device: Rolling walker (2 wheeled) Gait Pattern/deviations: Step-through pattern;Decreased stride length;Trunk flexed     General Gait Details: fatigues  quickly, reports weakness due to bowel prep and liquid diet since admission, min/guard for safety, distance to pt tolerance  Stairs            Wheelchair Mobility    Modified Rankin (Stroke Patients Only)       Balance Overall balance assessment: History of Falls;Needs assistance         Standing balance support: Bilateral upper extremity supported Standing balance-Leahy Scale: Poor Standing balance comment: requires bil UE support                             Pertinent Vitals/Pain Pain Assessment: Faces Faces Pain Scale: Hurts little more Pain Location: headache Pain Descriptors / Indicators: Aching Pain Intervention(s): Limited activity within patient's tolerance;Monitored during session;RN gave pain meds during session    Home Living Family/patient expects to be discharged to:: Private residence Living Arrangements: Children (daughter, son in law)   Type of Home: House Home Access: Stairs to enter   Technical brewer of Steps: 1 Home Layout: One level Home Equipment: Bedside commode;Wheelchair - [employer] - 4 wheels      Prior Function Level of Independence: Independent with assistive device(s)         Comments: typically uses rollator for mobility     Hand Dominance        Extremity/Trunk Assessment        Lower Extremity Assessment Lower Extremity Assessment: Generalized weakness       Communication   Communication: No difficulties  Cognition Arousal/Alertness: Awake/alert Behavior During Therapy: WFL for tasks assessed/performed Overall Cognitive Status: Within Functional  Limits for tasks assessed                                        General Comments      Exercises     Assessment/Plan    PT Assessment All further PT needs can be met in the next venue of care  PT Problem List Decreased strength;Decreased mobility;Decreased activity tolerance;Decreased balance       PT Treatment  Interventions      PT Goals (Current goals can be found in the Care Plan section)  Acute Rehab PT Goals PT Goal Formulation: All assessment and education complete, DC therapy    Frequency     Barriers to discharge        Co-evaluation               AM-PAC PT "6 Clicks" Daily Activity  Outcome Measure Difficulty turning over in bed (including adjusting bedclothes, sheets and blankets)?: A Lot Difficulty moving from lying on back to sitting on the side of the bed? : A Lot Difficulty sitting down on and standing up from a chair with arms (e.g., wheelchair, bedside commode, etc,.)?: A Lot Help needed moving to and from a bed to chair (including a wheelchair)?: A Little Help needed walking in hospital room?: A Little Help needed climbing 3-5 steps with a railing? : A Lot 6 Click Score: 14    End of Session Equipment Utilized During Treatment: Gait belt Activity Tolerance: Patient limited by fatigue Patient left: in bed;with call bell/phone within reach;with family/visitor present Nurse Communication: Mobility status PT Visit Diagnosis: Difficulty in walking, not elsewhere classified (R26.2);Muscle weakness (generalized) (M62.81)    Time: 4847-2072 PT Time Calculation (min) (ACUTE ONLY): 36 min   Charges:   PT Evaluation $PT Eval Moderate Complexity: 1 Mod     PT G Codes:        Carmelia Bake, PT, DPT 01/01/2017 Pager: [phone]  York Ram E 01/01/2017, 4:57 PM

## 2017-01-01 NOTE — Anesthesia Preprocedure Evaluation (Signed)
Anesthesia Evaluation  Patient identified by MRN, date of birth, ID band  Reviewed: Allergy & Precautions, NPO status , Patient's Chart, lab work & pertinent test results  Airway Mallampati: II  TM Distance: >3 FB     Dental   Pulmonary COPD,    breath sounds clear to auscultation       Cardiovascular hypertension,  Rhythm:Regular Rate:Normal     Neuro/Psych    GI/Hepatic negative GI ROS, Neg liver ROS,   Endo/Other  diabetes  Renal/GU negative Renal ROS     Musculoskeletal   Abdominal   Peds  Hematology   Anesthesia Other Findings   Reproductive/Obstetrics                             Anesthesia Physical Anesthesia Plan  ASA: III  Anesthesia Plan: MAC   Post-op Pain Management:    Induction:   PONV Risk Score and Plan: 2 and Ondansetron, Dexamethasone and Treatment may vary due to age or medical condition  Airway Management Planned: Simple Face Mask  Additional Equipment:   Intra-op Plan:   Post-operative Plan:   Informed Consent: I have reviewed the patients History and Physical, chart, labs and discussed the procedure including the risks, benefits and alternatives for the proposed anesthesia with the patient or authorized representative who has indicated his/her understanding and acceptance.   Dental advisory given  Plan Discussed with: CRNA and Anesthesiologist  Anesthesia Plan Comments:         Anesthesia Quick Evaluation

## 2017-01-01 NOTE — Progress Notes (Signed)
1000 cc tap water enema given, pt tolerated well, assisted to [facility], family in room

## 2017-01-01 NOTE — Progress Notes (Signed)
1000 cc of warm water given through tap water enema. Pt assisted to bsc. Daughter is in room. Instructed to call when pt is finished and needs assistance back to bed.

## 2017-01-01 NOTE — Progress Notes (Signed)
1000 cc tap water enema given, pt tolerated well, assisted to [facility]

## 2017-01-01 NOTE — Progress Notes (Signed)
Pt is being D/C and daughter stated that pt needs to eat dinner and stay another night because she is too weak. MD on the floor and notified; pt order placed and MD talked to family about pt going home after dinner. Pts family refused to order dinner and stated they are taking the pt home.

## 2017-01-01 NOTE — Transfer of Care (Signed)
Immediate Anesthesia Transfer of Care Note  Patient: Candice Miller  Procedure(s) Performed: Procedure(s): COLONOSCOPY WITH PROPOFOL (N/A)  Patient Location: ENDO  Anesthesia Type:MAC  Level of Consciousness:  sedated, patient cooperative and responds to stimulation  Airway & Oxygen Therapy:Patient Spontanous Breathing and Patient connected to face mask oxgen  Post-op Assessment:  Report given to ENDO RN and Post -op Vital signs reviewed and stable  Post vital signs:  Reviewed and stable  Last Vitals:  Vitals:   01/01/17 0758 01/01/17 1251  BP:  (!) 158/54  Pulse:  79  Resp:  12  Temp:  36.7 C  SpO2: 86% 57%    Complications: No apparent anesthesia complications

## 2017-01-01 NOTE — Progress Notes (Signed)
GI Discharge/Sign-Off Note  Subjective: patient had BRB per rectum Several times. Colonoscopy showed hemorrhoids and diverticulosis with a small polyp 2 to 3 mm it was biopsy hemoglobin stable  Principal Problem:   Lower GI bleed Active Problems:   COPD (chronic obstructive pulmonary disease) (HCC)   DM (diabetes mellitus) ([place])   History of breast cancer   HTN (hypertension)   Hemorrhoids   Nodule of left lung   GI bleed   Results for orders placed or performed during the hospital encounter of 12/30/16 (from the past 41 hour(s))  POC occult blood, ED     Status: Abnormal   Collection Time: 12/30/16  4:53 PM  Result Value Ref Range   Fecal Occult Bld POSITIVE (A) NEGATIVE  Comprehensive metabolic panel     Status: Abnormal   Collection Time: 12/30/16  5:15 PM  Result Value Ref Range   Sodium 140 135 - 145 mmol/L   Potassium 4.0 3.5 - 5.1 mmol/L   Chloride 102 101 - 111 mmol/L   CO2 28 22 - 32 mmol/L   Glucose, Bld 152 (H) 65 - 99 mg/dL   BUN 24 (H) 6 - 20 mg/dL   Creatinine, Ser 1.10 (H) 0.44 - 1.00 mg/dL   Calcium 9.4 8.9 - 10.3 mg/dL   Total Protein 7.7 6.5 - 8.1 g/dL   Albumin 3.7 3.5 - 5.0 g/dL   AST 30 15 - 41 U/L   ALT 14 14 - 54 U/L   Alkaline Phosphatase 110 38 - 126 U/L   Total Bilirubin 0.6 0.3 - 1.2 mg/dL   GFR calc non Af Amer 43 (L) >60 mL/min   GFR calc Af Amer 50 (L) >60 mL/min    Comment: (NOTE) The eGFR has been calculated using the CKD EPI equation. This calculation has not been validated in all clinical situations. eGFR's persistently <60 mL/min signify possible Chronic Kidney Disease.    Anion gap 10 5 - 15  CBC     Status: Abnormal   Collection Time: 12/30/16  5:15 PM  Result Value Ref Range   WBC 7.1 4.0 - 10.5 K/uL   RBC 3.59 (L) 3.87 - 5.11 MIL/uL   Hemoglobin 10.8 (L) 12.0 - 15.0 g/dL   HCT 32.6 (L) 36.0 - 46.0 %   MCV 90.8 78.0 - 100.0 fL   MCH 30.1 26.0 - 34.0 pg   MCHC 33.1 30.0 - 36.0 g/dL   RDW 13.0 11.5 - 15.5 %   Platelets 252  150 - 400 K/uL  Type and screen [place]     Status: None   Collection Time: 12/30/16  5:15 PM  Result Value Ref Range   ABO/RH(D) O NEG    Antibody Screen NEG    Sample Expiration 01/02/2017   ABO/Rh     Status: None   Collection Time: 12/30/16  5:15 PM  Result Value Ref Range   ABO/RH(D) O NEG   I-stat troponin, ED     Status: None   Collection Time: 12/30/16  5:31 PM  Result Value Ref Range   Troponin i, poc 0.02 0.00 - 0.08 ng/mL   Comment 3            Comment: Due to the release kinetics of cTnI, a negative result within the first hours of the onset of symptoms does not rule out myocardial infarction with certainty. If myocardial infarction is still suspected, repeat the test at appropriate intervals.   Urinalysis, Routine w reflex microscopic  Status: Abnormal   Collection Time: 12/30/16  6:10 PM  Result Value Ref Range   Color, Urine YELLOW YELLOW   APPearance CLEAR CLEAR   Specific Gravity, Urine 1.006 1.005 - 1.030   pH 7.0 5.0 - 8.0   Glucose, UA NEGATIVE NEGATIVE mg/dL   Hgb urine dipstick SMALL (A) NEGATIVE   Bilirubin Urine NEGATIVE NEGATIVE   Ketones, ur NEGATIVE NEGATIVE mg/dL   Protein, ur NEGATIVE NEGATIVE mg/dL   Nitrite NEGATIVE NEGATIVE   Leukocytes, UA NEGATIVE NEGATIVE   RBC / HPF 0-5 0 - 5 RBC/hpf   WBC, UA 0-5 0 - 5 WBC/hpf   Bacteria, UA NONE SEEN NONE SEEN   Squamous Epithelial / LPF 0-5 (A) NONE SEEN   Mucus PRESENT   Urine culture     Status: Abnormal   Collection Time: 12/30/16  6:10 PM  Result Value Ref Range   Specimen Description URINE, RANDOM    Special Requests NONE    Culture MULTIPLE SPECIES PRESENT, SUGGEST RECOLLECTION (A)    Report Status 01/01/2017 FINAL   CBC     Status: Abnormal   Collection Time: 12/31/16  2:08 AM  Result Value Ref Range   WBC 7.0 4.0 - 10.5 K/uL   RBC 3.68 (L) 3.87 - 5.11 MIL/uL   Hemoglobin 11.0 (L) 12.0 - 15.0 g/dL   HCT 33.4 (L) 36.0 - 46.0 %   MCV 90.8 78.0 - 100.0 fL    MCH 29.9 26.0 - 34.0 pg   MCHC 32.9 30.0 - 36.0 g/dL   RDW 13.0 11.5 - 15.5 %   Platelets 248 150 - 400 K/uL  Glucose, capillary     Status: None   Collection Time: 12/31/16  7:32 AM  Result Value Ref Range   Glucose-Capillary 76 65 - 99 mg/dL  CBC     Status: Abnormal   Collection Time: 12/31/16  9:46 AM  Result Value Ref Range   WBC 6.1 4.0 - 10.5 K/uL   RBC 3.69 (L) 3.87 - 5.11 MIL/uL   Hemoglobin 10.8 (L) 12.0 - 15.0 g/dL   HCT 32.9 (L) 36.0 - 46.0 %   MCV 89.2 78.0 - 100.0 fL   MCH 29.3 26.0 - 34.0 pg   MCHC 32.8 30.0 - 36.0 g/dL   RDW 13.1 11.5 - 15.5 %   Platelets 275 150 - 400 K/uL  Protime-INR     Status: None   Collection Time: 12/31/16  9:46 AM  Result Value Ref Range   Prothrombin Time 13.6 11.4 - 15.2 seconds   INR 1.05   Glucose, capillary     Status: Abnormal   Collection Time: 12/31/16 11:29 AM  Result Value Ref Range   Glucose-Capillary 139 (H) 65 - 99 mg/dL  Glucose, capillary     Status: None   Collection Time: 12/31/16  4:46 PM  Result Value Ref Range   Glucose-Capillary 85 65 - 99 mg/dL  Glucose, capillary     Status: None   Collection Time: 12/31/16 10:11 PM  Result Value Ref Range   Glucose-Capillary 75 65 - 99 mg/dL  Basic metabolic panel     Status: Abnormal   Collection Time: 01/01/17  6:25 AM  Result Value Ref Range   Sodium 140 135 - 145 mmol/L   Potassium 4.0 3.5 - 5.1 mmol/L   Chloride 103 101 - 111 mmol/L   CO2 26 22 - 32 mmol/L   Glucose, Bld 91 65 - 99 mg/dL   BUN 13 6 - 20  mg/dL   Creatinine, Ser 0.95 0.44 - 1.00 mg/dL   Calcium 9.1 8.9 - 10.3 mg/dL   GFR calc non Af Amer 51 (L) >60 mL/min   GFR calc Af Amer 59 (L) >60 mL/min    Comment: (NOTE) The eGFR has been calculated using the CKD EPI equation. This calculation has not been validated in all clinical situations. eGFR's persistently <60 mL/min signify possible Chronic Kidney Disease.    Anion gap 11 5 - 15  CBC with Differential/Platelet     Status: Abnormal   Collection  Time: 01/01/17  6:25 AM  Result Value Ref Range   WBC 5.6 4.0 - 10.5 K/uL   RBC 3.67 (L) 3.87 - 5.11 MIL/uL   Hemoglobin 11.0 (L) 12.0 - 15.0 g/dL   HCT 33.2 (L) 36.0 - 46.0 %   MCV 90.5 78.0 - 100.0 fL   MCH 30.0 26.0 - 34.0 pg   MCHC 33.1 30.0 - 36.0 g/dL   RDW 13.1 11.5 - 15.5 %   Platelets 259 150 - 400 K/uL   Neutrophils Relative % 82 %   Neutro Abs 4.6 1.7 - 7.7 K/uL   Lymphocytes Relative 11 %   Lymphs Abs 0.6 (L) 0.7 - 4.0 K/uL   Monocytes Relative 5 %   Monocytes Absolute 0.3 0.1 - 1.0 K/uL   Eosinophils Relative 2 %   Eosinophils Absolute 0.1 0.0 - 0.7 K/uL   Basophils Relative 0 %   Basophils Absolute 0.0 0.0 - 0.1 K/uL  Magnesium     Status: None   Collection Time: 01/01/17  6:25 AM  Result Value Ref Range   Magnesium 1.7 1.7 - 2.4 mg/dL  Iron and TIBC     Status: Abnormal   Collection Time: 01/01/17  6:25 AM  Result Value Ref Range   Iron 69 28 - 170 ug/dL   TIBC 451 (H) 250 - 450 ug/dL   Saturation Ratios 15 10.4 - 31.8 %   UIBC 382 ug/dL    Comment: Performed at [facility], [place] [address], [place], [place] [address]  Vitamin B12     Status: Abnormal   Collection Time: 01/01/17  6:25 AM  Result Value Ref Range   Vitamin B-12 2,773 (H) 180 - 914 pg/mL    Comment: (NOTE) This assay is not validated for testing neonatal or myeloproliferative syndrome specimens for Vitamin B12 levels. Performed at [facility], [place] [address], [place], [place] [address]   Folate     Status: None   Collection Time: 01/01/17  6:25 AM  Result Value Ref Range   Folate 72.0 >5.9 ng/mL    Comment: RESULTS CONFIRMED BY MANUAL DILUTION Performed at [facility], [place] [address], [place], [place] [address]   TSH     Status: None   Collection Time: 01/01/17  6:25 AM  Result Value Ref Range   TSH 2.458 0.350 - 4.500 uIU/mL    Comment: Performed by a 3rd Generation assay with a functional sensitivity of <=0.01 uIU/mL.  Reticulocytes     Status: Abnormal    Collection Time: 01/01/17  6:25 AM  Result Value Ref Range   Retic Ct Pct 1.1 0.4 - 3.1 %   RBC. 3.67 (L) 3.87 - 5.11 MIL/uL   Retic Count, Absolute 40.4 19.0 - 186.0 K/uL  Hemoglobin A1c     Status: Abnormal   Collection Time: 01/01/17  6:25 AM  Result Value Ref Range   Hgb A1c MFr Bld  6.3 (H) 4.8 - 5.6 %    Comment: (NOTE) Pre diabetes:          5.7%-6.4% Diabetes:              >6.4% Glycemic control for   <7.0% adults with diabetes    Mean Plasma Glucose 134.11 mg/dL    Comment: Performed at [facility], [place] [address] [place], [place] [address]  Glucose, capillary     Status: None   Collection Time: 01/01/17  7:20 AM  Result Value Ref Range   Glucose-Capillary 89 65 - 99 mg/dL  Glucose, capillary     Status: None   Collection Time: 01/01/17 11:41 AM  Result Value Ref Range   Glucose-Capillary 76 65 - 99 mg/dL    Dg Chest 2 View  Result Date: 12/30/2016 CLINICAL DATA:  Bright red blood in stool. EXAM: CHEST  2 VIEW COMPARISON:  None. FINDINGS: No pneumothorax. The heart, hila, and mediastinum are normal. There is a possible nodule in the left upper lung peripherally measuring 7.5 mm. No other nodules or masses. No suspicious infiltrate. The cardiomediastinal silhouette is unremarkable. Anterior wedging of an upper lumbar vertebral body is age indeterminate. Anterior wedging of a midthoracic vertebral body is age indeterminate. IMPRESSION: 1. Possible 7.5 mm nodule in the left upper lobe. This could be compared to previous studies available. Otherwise, a CT scan could further evaluate. 2. Anterior wedging of a midthoracic vertebral body and an upper lumbar vertebral body. These findings are age indeterminate. Recommend clinical correlation. 3. No other acute abnormalities. Electronically Signed   By: Dorise Bullion III M.D   On: 12/30/2016 18:53    _0 @  GI DISCHARGE PLANNING:  Diet:  Heart healthy diet  Anticoagulation/Antiplatelet Rx Suggestions:   GI  Medications: Miralax in order to keep stool soft. Patient to adjust dosage  Labs/Procedures Ordered:  GI FOLLOW UP:  Call [phone] to make appointment.   Doctor:  Dr. Oletta Lamas  Time:  as needed   Laurence Spates, MD   Pager [phone] If no answer or after hours call [phone]

## 2017-01-01 NOTE — Interval H&P Note (Signed)
History and Physical Interval Note:  01/01/2017 1:12 PM  Candice Miller  has presented today for surgery, with the diagnosis of Rectal bleeding  The various methods of treatment have been discussed with the patient and family. After consideration of risks, benefits and other options for treatment, the patient has consented to  Procedure(s): COLONOSCOPY WITH PROPOFOL (N/A) as a surgical intervention .  The patient's history has been reviewed, patient examined, no change in status, stable for surgery.  I have reviewed the patient's chart and labs.  Questions were answered to the patient's satisfaction.     Ikey Omary JR,Florance Paolillo L

## 2017-01-01 NOTE — Anesthesia Postprocedure Evaluation (Signed)
Anesthesia Post Note  Patient: Candice Miller  Procedure(s) Performed: Procedure(s) (LRB): COLONOSCOPY WITH PROPOFOL (N/A)     Patient location during evaluation: Endoscopy Anesthesia Type: MAC Level of consciousness: awake Pain management: pain level controlled Vital Signs Assessment: post-procedure vital signs reviewed and stable Respiratory status: spontaneous breathing Cardiovascular status: stable Anesthetic complications: no    Last Vitals:  Vitals:   01/01/17 0758 01/01/17 1251  BP:  (!) 158/54  Pulse:  79  Resp:  12  Temp:  36.7 C  SpO2: 96% 97%    Last Pain:  Vitals:   01/01/17 1251  TempSrc: Oral  PainSc:                  Candice Miller

## 2017-01-01 NOTE — Op Note (Signed)
[facility] Patient Name: Candice Miller Procedure Date: 01/01/2017 MRN: [id number] Attending MD: Nancy Fetter Dr., MD Date of Birth: Jul 20, 1924 CSN: [id number] Age: 83 Admit Type: Inpatient Procedure:                Colonoscopy with biopsy Indications:              Hematochezia, Unexplained iron deficiency anemia Providers:                Jeneen Rinks L. Oletta Lamas Dr., MD, Elmer Ramp. Tilden Dome, RN, Cletis [place], Technician Referring MD:              Medicines:                Monitored Anesthesia Care Complications:            No immediate complications. Estimated Blood Loss:     Estimated blood loss: none. Procedure:                Pre-Anesthesia Assessment:                           - Prior to the procedure, a History and Physical                            was performed, and patient medications and                            allergies were reviewed. The patient's tolerance of                            previous anesthesia was also reviewed. The risks                            and benefits of the procedure and the sedation                            options and risks were discussed with the patient.                            All questions were answered, and informed consent                            was obtained. Prior Anticoagulants: The patient has                            taken no previous anticoagulant or antiplatelet                            agents. ASA Grade Assessment: III - A patient with                            severe systemic disease. After reviewing the risks  and benefits, the patient was deemed in                            satisfactory condition to undergo the procedure.                           After obtaining informed consent, the colonoscope                            was passed under direct vision. Throughout the                            procedure, the patient's blood pressure, pulse, and                      oxygen saturations were monitored continuously. The                            [id number] ([id number]) scope was introduced through                            the anus and advanced to the the cecum, identified                            by appendiceal orifice and ileocecal valve. The                            colonoscopy was performed without difficulty. The                            patient tolerated the procedure well. The quality                            of the bowel preparation was good. The ileocecal                            valve, appendiceal orifice, and rectum were                            photographed. Findings:      The perianal and digital rectal examinations were normal.      Multiple small and large-mouthed diverticula were found in the sigmoid       colon and descending colon.      A 3 mm polyp was found in the ascending colon. The polyp was sessile.       Biopsies were taken with a cold forceps for histology.      Non-bleeding internal hemorrhoids were found during retroflexion. The       hemorrhoids were moderate and Grade I (internal hemorrhoids that do not       prolapse). Impression:               - Diverticulosis in the sigmoid colon and in the                            descending colon.                           -  One 3 mm polyp in the ascending colon. Biopsied.                           - Non-bleeding internal hemorrhoids. Moderate Sedation:      N/A- Per Anesthesia Care Recommendation:           - Patient has a contact number available for                            emergencies. The signs and symptoms of potential                            delayed complications were discussed with the                            patient. Return to normal activities tomorrow.                            Written discharge instructions were provided to the                            patient.                           - Resume previous diet.                            - Continue present medications.                           - No repeat colonoscopy due to age.                           - Return to GI clinic PRN.                           - Miralax 1 capful (17 grams) in 8 ounces of water                            PO PRN. Procedure Code(s):        --- Professional ---                           [phone], Colonoscopy, flexible; with biopsy, single                            or multiple Diagnosis Code(s):        --- Professional ---                           D12.2, Benign neoplasm of ascending colon                           K57.30, Diverticulosis of large intestine without                            perforation  or abscess without bleeding                           D50.9, Iron deficiency anemia, unspecified                           K92.1, Melena (includes Hematochezia)                           K64.0, First degree hemorrhoids CPT copyright 2016 [employer]. All rights reserved. The codes documented in this report are preliminary and upon coder review may  be revised to meet current compliance requirements. Nancy Fetter Dr., MD 01/01/2017 2:18:28 PM This report has been signed electronically. Number of Addenda: 0

## 2017-01-02 ENCOUNTER — Encounter (HOSPITAL_COMMUNITY): Payer: Self-pay | Admitting: Gastroenterology

## 2017-01-03 DIAGNOSIS — R296 Repeated falls: Secondary | ICD-10-CM | POA: Diagnosis not present

## 2017-01-03 DIAGNOSIS — J449 Chronic obstructive pulmonary disease, unspecified: Secondary | ICD-10-CM | POA: Diagnosis not present

## 2017-01-03 DIAGNOSIS — I10 Essential (primary) hypertension: Secondary | ICD-10-CM | POA: Diagnosis not present

## 2017-01-03 DIAGNOSIS — M6281 Muscle weakness (generalized): Secondary | ICD-10-CM | POA: Diagnosis not present

## 2017-01-03 DIAGNOSIS — F0390 Unspecified dementia without behavioral disturbance: Secondary | ICD-10-CM | POA: Diagnosis not present

## 2017-01-03 DIAGNOSIS — M810 Age-related osteoporosis without current pathological fracture: Secondary | ICD-10-CM | POA: Diagnosis not present

## 2017-01-03 DIAGNOSIS — Z9181 History of falling: Secondary | ICD-10-CM | POA: Diagnosis not present

## 2017-01-03 DIAGNOSIS — Z7982 Long term (current) use of aspirin: Secondary | ICD-10-CM | POA: Diagnosis not present

## 2017-01-03 DIAGNOSIS — K5790 Diverticulosis of intestine, part unspecified, without perforation or abscess without bleeding: Secondary | ICD-10-CM | POA: Diagnosis not present

## 2017-01-03 DIAGNOSIS — E1142 Type 2 diabetes mellitus with diabetic polyneuropathy: Secondary | ICD-10-CM | POA: Diagnosis not present

## 2017-01-03 DIAGNOSIS — Z7984 Long term (current) use of oral hypoglycemic drugs: Secondary | ICD-10-CM | POA: Diagnosis not present

## 2017-01-03 DIAGNOSIS — I679 Cerebrovascular disease, unspecified: Secondary | ICD-10-CM | POA: Diagnosis not present

## 2017-01-07 DIAGNOSIS — E1142 Type 2 diabetes mellitus with diabetic polyneuropathy: Secondary | ICD-10-CM | POA: Diagnosis not present

## 2017-01-07 DIAGNOSIS — F0390 Unspecified dementia without behavioral disturbance: Secondary | ICD-10-CM | POA: Diagnosis not present

## 2017-01-07 DIAGNOSIS — K5790 Diverticulosis of intestine, part unspecified, without perforation or abscess without bleeding: Secondary | ICD-10-CM | POA: Diagnosis not present

## 2017-01-07 DIAGNOSIS — M6281 Muscle weakness (generalized): Secondary | ICD-10-CM | POA: Diagnosis not present

## 2017-01-07 DIAGNOSIS — M810 Age-related osteoporosis without current pathological fracture: Secondary | ICD-10-CM | POA: Diagnosis not present

## 2017-01-07 DIAGNOSIS — I679 Cerebrovascular disease, unspecified: Secondary | ICD-10-CM | POA: Diagnosis not present

## 2017-01-09 DIAGNOSIS — M6281 Muscle weakness (generalized): Secondary | ICD-10-CM | POA: Diagnosis not present

## 2017-01-09 DIAGNOSIS — K5790 Diverticulosis of intestine, part unspecified, without perforation or abscess without bleeding: Secondary | ICD-10-CM | POA: Diagnosis not present

## 2017-01-09 DIAGNOSIS — M810 Age-related osteoporosis without current pathological fracture: Secondary | ICD-10-CM | POA: Diagnosis not present

## 2017-01-09 DIAGNOSIS — I679 Cerebrovascular disease, unspecified: Secondary | ICD-10-CM | POA: Diagnosis not present

## 2017-01-09 DIAGNOSIS — F0390 Unspecified dementia without behavioral disturbance: Secondary | ICD-10-CM | POA: Diagnosis not present

## 2017-01-09 DIAGNOSIS — E1142 Type 2 diabetes mellitus with diabetic polyneuropathy: Secondary | ICD-10-CM | POA: Diagnosis not present

## 2017-01-10 DIAGNOSIS — I679 Cerebrovascular disease, unspecified: Secondary | ICD-10-CM | POA: Diagnosis not present

## 2017-01-10 DIAGNOSIS — K5790 Diverticulosis of intestine, part unspecified, without perforation or abscess without bleeding: Secondary | ICD-10-CM | POA: Diagnosis not present

## 2017-01-10 DIAGNOSIS — M810 Age-related osteoporosis without current pathological fracture: Secondary | ICD-10-CM | POA: Diagnosis not present

## 2017-01-10 DIAGNOSIS — M6281 Muscle weakness (generalized): Secondary | ICD-10-CM | POA: Diagnosis not present

## 2017-01-10 DIAGNOSIS — E1142 Type 2 diabetes mellitus with diabetic polyneuropathy: Secondary | ICD-10-CM | POA: Diagnosis not present

## 2017-01-10 DIAGNOSIS — F0390 Unspecified dementia without behavioral disturbance: Secondary | ICD-10-CM | POA: Diagnosis not present

## 2017-01-14 DIAGNOSIS — F0390 Unspecified dementia without behavioral disturbance: Secondary | ICD-10-CM | POA: Diagnosis not present

## 2017-01-14 DIAGNOSIS — M810 Age-related osteoporosis without current pathological fracture: Secondary | ICD-10-CM | POA: Diagnosis not present

## 2017-01-14 DIAGNOSIS — M6281 Muscle weakness (generalized): Secondary | ICD-10-CM | POA: Diagnosis not present

## 2017-01-14 DIAGNOSIS — E1142 Type 2 diabetes mellitus with diabetic polyneuropathy: Secondary | ICD-10-CM | POA: Diagnosis not present

## 2017-01-14 DIAGNOSIS — K5790 Diverticulosis of intestine, part unspecified, without perforation or abscess without bleeding: Secondary | ICD-10-CM | POA: Diagnosis not present

## 2017-01-14 DIAGNOSIS — I679 Cerebrovascular disease, unspecified: Secondary | ICD-10-CM | POA: Diagnosis not present

## 2017-01-16 DIAGNOSIS — M810 Age-related osteoporosis without current pathological fracture: Secondary | ICD-10-CM | POA: Diagnosis not present

## 2017-01-16 DIAGNOSIS — I679 Cerebrovascular disease, unspecified: Secondary | ICD-10-CM | POA: Diagnosis not present

## 2017-01-16 DIAGNOSIS — F0390 Unspecified dementia without behavioral disturbance: Secondary | ICD-10-CM | POA: Diagnosis not present

## 2017-01-16 DIAGNOSIS — K5790 Diverticulosis of intestine, part unspecified, without perforation or abscess without bleeding: Secondary | ICD-10-CM | POA: Diagnosis not present

## 2017-01-16 DIAGNOSIS — E1142 Type 2 diabetes mellitus with diabetic polyneuropathy: Secondary | ICD-10-CM | POA: Diagnosis not present

## 2017-01-16 DIAGNOSIS — M6281 Muscle weakness (generalized): Secondary | ICD-10-CM | POA: Diagnosis not present

## 2017-01-21 DIAGNOSIS — L570 Actinic keratosis: Secondary | ICD-10-CM | POA: Diagnosis not present

## 2017-01-21 DIAGNOSIS — I679 Cerebrovascular disease, unspecified: Secondary | ICD-10-CM | POA: Diagnosis not present

## 2017-01-21 DIAGNOSIS — M810 Age-related osteoporosis without current pathological fracture: Secondary | ICD-10-CM | POA: Diagnosis not present

## 2017-01-21 DIAGNOSIS — E1142 Type 2 diabetes mellitus with diabetic polyneuropathy: Secondary | ICD-10-CM | POA: Diagnosis not present

## 2017-01-21 DIAGNOSIS — M6281 Muscle weakness (generalized): Secondary | ICD-10-CM | POA: Diagnosis not present

## 2017-01-21 DIAGNOSIS — L821 Other seborrheic keratosis: Secondary | ICD-10-CM | POA: Diagnosis not present

## 2017-01-21 DIAGNOSIS — F0390 Unspecified dementia without behavioral disturbance: Secondary | ICD-10-CM | POA: Diagnosis not present

## 2017-01-21 DIAGNOSIS — K5790 Diverticulosis of intestine, part unspecified, without perforation or abscess without bleeding: Secondary | ICD-10-CM | POA: Diagnosis not present

## 2017-01-23 DIAGNOSIS — K5790 Diverticulosis of intestine, part unspecified, without perforation or abscess without bleeding: Secondary | ICD-10-CM | POA: Diagnosis not present

## 2017-01-23 DIAGNOSIS — E1142 Type 2 diabetes mellitus with diabetic polyneuropathy: Secondary | ICD-10-CM | POA: Diagnosis not present

## 2017-01-23 DIAGNOSIS — M810 Age-related osteoporosis without current pathological fracture: Secondary | ICD-10-CM | POA: Diagnosis not present

## 2017-01-23 DIAGNOSIS — F0390 Unspecified dementia without behavioral disturbance: Secondary | ICD-10-CM | POA: Diagnosis not present

## 2017-01-23 DIAGNOSIS — M6281 Muscle weakness (generalized): Secondary | ICD-10-CM | POA: Diagnosis not present

## 2017-01-23 DIAGNOSIS — I679 Cerebrovascular disease, unspecified: Secondary | ICD-10-CM | POA: Diagnosis not present

## 2017-01-28 DIAGNOSIS — K5790 Diverticulosis of intestine, part unspecified, without perforation or abscess without bleeding: Secondary | ICD-10-CM | POA: Diagnosis not present

## 2017-01-28 DIAGNOSIS — I679 Cerebrovascular disease, unspecified: Secondary | ICD-10-CM | POA: Diagnosis not present

## 2017-01-28 DIAGNOSIS — R911 Solitary pulmonary nodule: Secondary | ICD-10-CM | POA: Diagnosis not present

## 2017-01-28 DIAGNOSIS — F0390 Unspecified dementia without behavioral disturbance: Secondary | ICD-10-CM | POA: Diagnosis not present

## 2017-01-28 DIAGNOSIS — E1142 Type 2 diabetes mellitus with diabetic polyneuropathy: Secondary | ICD-10-CM | POA: Diagnosis not present

## 2017-01-28 DIAGNOSIS — M810 Age-related osteoporosis without current pathological fracture: Secondary | ICD-10-CM | POA: Diagnosis not present

## 2017-01-28 DIAGNOSIS — M6281 Muscle weakness (generalized): Secondary | ICD-10-CM | POA: Diagnosis not present

## 2017-01-29 DIAGNOSIS — M6281 Muscle weakness (generalized): Secondary | ICD-10-CM | POA: Diagnosis not present

## 2017-01-29 DIAGNOSIS — E1142 Type 2 diabetes mellitus with diabetic polyneuropathy: Secondary | ICD-10-CM | POA: Diagnosis not present

## 2017-01-29 DIAGNOSIS — M810 Age-related osteoporosis without current pathological fracture: Secondary | ICD-10-CM | POA: Diagnosis not present

## 2017-01-29 DIAGNOSIS — F0390 Unspecified dementia without behavioral disturbance: Secondary | ICD-10-CM | POA: Diagnosis not present

## 2017-01-29 DIAGNOSIS — K5790 Diverticulosis of intestine, part unspecified, without perforation or abscess without bleeding: Secondary | ICD-10-CM | POA: Diagnosis not present

## 2017-01-29 DIAGNOSIS — I679 Cerebrovascular disease, unspecified: Secondary | ICD-10-CM | POA: Diagnosis not present

## 2017-01-30 DIAGNOSIS — M810 Age-related osteoporosis without current pathological fracture: Secondary | ICD-10-CM | POA: Diagnosis not present

## 2017-01-30 DIAGNOSIS — F0390 Unspecified dementia without behavioral disturbance: Secondary | ICD-10-CM | POA: Diagnosis not present

## 2017-01-30 DIAGNOSIS — E1142 Type 2 diabetes mellitus with diabetic polyneuropathy: Secondary | ICD-10-CM | POA: Diagnosis not present

## 2017-01-30 DIAGNOSIS — I679 Cerebrovascular disease, unspecified: Secondary | ICD-10-CM | POA: Diagnosis not present

## 2017-01-30 DIAGNOSIS — K5790 Diverticulosis of intestine, part unspecified, without perforation or abscess without bleeding: Secondary | ICD-10-CM | POA: Diagnosis not present

## 2017-01-30 DIAGNOSIS — M6281 Muscle weakness (generalized): Secondary | ICD-10-CM | POA: Diagnosis not present

## 2017-02-04 DIAGNOSIS — E1142 Type 2 diabetes mellitus with diabetic polyneuropathy: Secondary | ICD-10-CM | POA: Diagnosis not present

## 2017-02-04 DIAGNOSIS — M810 Age-related osteoporosis without current pathological fracture: Secondary | ICD-10-CM | POA: Diagnosis not present

## 2017-02-04 DIAGNOSIS — K5790 Diverticulosis of intestine, part unspecified, without perforation or abscess without bleeding: Secondary | ICD-10-CM | POA: Diagnosis not present

## 2017-02-04 DIAGNOSIS — M6281 Muscle weakness (generalized): Secondary | ICD-10-CM | POA: Diagnosis not present

## 2017-02-04 DIAGNOSIS — F0390 Unspecified dementia without behavioral disturbance: Secondary | ICD-10-CM | POA: Diagnosis not present

## 2017-02-04 DIAGNOSIS — I679 Cerebrovascular disease, unspecified: Secondary | ICD-10-CM | POA: Diagnosis not present

## 2017-02-06 DIAGNOSIS — I679 Cerebrovascular disease, unspecified: Secondary | ICD-10-CM | POA: Diagnosis not present

## 2017-02-06 DIAGNOSIS — K5790 Diverticulosis of intestine, part unspecified, without perforation or abscess without bleeding: Secondary | ICD-10-CM | POA: Diagnosis not present

## 2017-02-06 DIAGNOSIS — F0390 Unspecified dementia without behavioral disturbance: Secondary | ICD-10-CM | POA: Diagnosis not present

## 2017-02-06 DIAGNOSIS — M810 Age-related osteoporosis without current pathological fracture: Secondary | ICD-10-CM | POA: Diagnosis not present

## 2017-02-06 DIAGNOSIS — E1142 Type 2 diabetes mellitus with diabetic polyneuropathy: Secondary | ICD-10-CM | POA: Diagnosis not present

## 2017-02-06 DIAGNOSIS — M6281 Muscle weakness (generalized): Secondary | ICD-10-CM | POA: Diagnosis not present

## 2017-02-11 DIAGNOSIS — M810 Age-related osteoporosis without current pathological fracture: Secondary | ICD-10-CM | POA: Diagnosis not present

## 2017-02-11 DIAGNOSIS — M6281 Muscle weakness (generalized): Secondary | ICD-10-CM | POA: Diagnosis not present

## 2017-02-11 DIAGNOSIS — E1142 Type 2 diabetes mellitus with diabetic polyneuropathy: Secondary | ICD-10-CM | POA: Diagnosis not present

## 2017-02-11 DIAGNOSIS — I679 Cerebrovascular disease, unspecified: Secondary | ICD-10-CM | POA: Diagnosis not present

## 2017-02-11 DIAGNOSIS — F0390 Unspecified dementia without behavioral disturbance: Secondary | ICD-10-CM | POA: Diagnosis not present

## 2017-02-11 DIAGNOSIS — K5790 Diverticulosis of intestine, part unspecified, without perforation or abscess without bleeding: Secondary | ICD-10-CM | POA: Diagnosis not present

## 2017-02-13 DIAGNOSIS — I679 Cerebrovascular disease, unspecified: Secondary | ICD-10-CM | POA: Diagnosis not present

## 2017-02-13 DIAGNOSIS — M810 Age-related osteoporosis without current pathological fracture: Secondary | ICD-10-CM | POA: Diagnosis not present

## 2017-02-13 DIAGNOSIS — E1142 Type 2 diabetes mellitus with diabetic polyneuropathy: Secondary | ICD-10-CM | POA: Diagnosis not present

## 2017-02-13 DIAGNOSIS — F0390 Unspecified dementia without behavioral disturbance: Secondary | ICD-10-CM | POA: Diagnosis not present

## 2017-02-13 DIAGNOSIS — M6281 Muscle weakness (generalized): Secondary | ICD-10-CM | POA: Diagnosis not present

## 2017-02-13 DIAGNOSIS — K5790 Diverticulosis of intestine, part unspecified, without perforation or abscess without bleeding: Secondary | ICD-10-CM | POA: Diagnosis not present

## 2017-02-23 ENCOUNTER — Other Ambulatory Visit: Payer: Self-pay | Admitting: Obstetrics and Gynecology

## 2017-02-23 ENCOUNTER — Other Ambulatory Visit: Payer: Self-pay | Admitting: Family Medicine

## 2017-02-23 DIAGNOSIS — R911 Solitary pulmonary nodule: Secondary | ICD-10-CM

## 2017-02-27 ENCOUNTER — Ambulatory Visit
Admission: RE | Admit: 2017-02-27 | Discharge: 2017-02-27 | Disposition: A | Discharge status: Home / Self Care | Payer: Medicare Other | Source: Ambulatory Visit | Attending: Family Medicine | Admitting: Family Medicine

## 2017-02-27 DIAGNOSIS — R911 Solitary pulmonary nodule: Secondary | ICD-10-CM

## 2017-03-04 ENCOUNTER — Other Ambulatory Visit: Payer: PRIVATE HEALTH INSURANCE

## 2017-03-27 DIAGNOSIS — R296 Repeated falls: Secondary | ICD-10-CM | POA: Diagnosis not present

## 2017-03-27 DIAGNOSIS — F0390 Unspecified dementia without behavioral disturbance: Secondary | ICD-10-CM | POA: Diagnosis not present

## 2017-03-27 DIAGNOSIS — Z0000 Encounter for general adult medical examination without abnormal findings: Secondary | ICD-10-CM | POA: Diagnosis not present

## 2017-03-27 DIAGNOSIS — R42 Dizziness and giddiness: Secondary | ICD-10-CM | POA: Diagnosis not present

## 2017-03-27 DIAGNOSIS — I700 Atherosclerosis of aorta: Secondary | ICD-10-CM | POA: Diagnosis not present

## 2017-03-27 DIAGNOSIS — E7800 Pure hypercholesterolemia, unspecified: Secondary | ICD-10-CM | POA: Diagnosis not present

## 2017-03-27 DIAGNOSIS — J449 Chronic obstructive pulmonary disease, unspecified: Secondary | ICD-10-CM | POA: Diagnosis not present

## 2017-03-27 DIAGNOSIS — E1142 Type 2 diabetes mellitus with diabetic polyneuropathy: Secondary | ICD-10-CM | POA: Diagnosis not present

## 2017-03-27 DIAGNOSIS — I10 Essential (primary) hypertension: Secondary | ICD-10-CM | POA: Diagnosis not present

## 2017-03-27 DIAGNOSIS — I679 Cerebrovascular disease, unspecified: Secondary | ICD-10-CM | POA: Diagnosis not present

## 2017-03-27 DIAGNOSIS — M810 Age-related osteoporosis without current pathological fracture: Secondary | ICD-10-CM | POA: Diagnosis not present

## 2017-03-27 DIAGNOSIS — Z23 Encounter for immunization: Secondary | ICD-10-CM | POA: Diagnosis not present

## 2017-04-24 DIAGNOSIS — J209 Acute bronchitis, unspecified: Secondary | ICD-10-CM | POA: Diagnosis not present

## 2017-04-24 DIAGNOSIS — J441 Chronic obstructive pulmonary disease with (acute) exacerbation: Secondary | ICD-10-CM | POA: Diagnosis not present

## 2017-04-27 DIAGNOSIS — D649 Anemia, unspecified: Secondary | ICD-10-CM | POA: Diagnosis not present

## 2017-04-27 DIAGNOSIS — J449 Chronic obstructive pulmonary disease, unspecified: Secondary | ICD-10-CM | POA: Diagnosis not present

## 2017-04-29 DIAGNOSIS — D649 Anemia, unspecified: Secondary | ICD-10-CM | POA: Diagnosis not present

## 2017-04-29 DIAGNOSIS — R9389 Abnormal findings on diagnostic imaging of other specified body structures: Secondary | ICD-10-CM | POA: Diagnosis not present

## 2017-04-29 DIAGNOSIS — K922 Gastrointestinal hemorrhage, unspecified: Secondary | ICD-10-CM | POA: Diagnosis not present

## 2017-05-02 ENCOUNTER — Other Ambulatory Visit: Payer: Self-pay | Admitting: Family Medicine

## 2017-05-02 DIAGNOSIS — R911 Solitary pulmonary nodule: Secondary | ICD-10-CM

## 2017-05-20 DIAGNOSIS — J849 Interstitial pulmonary disease, unspecified: Secondary | ICD-10-CM | POA: Diagnosis not present

## 2017-05-20 DIAGNOSIS — L03119 Cellulitis of unspecified part of limb: Secondary | ICD-10-CM | POA: Diagnosis not present

## 2017-05-20 DIAGNOSIS — R600 Localized edema: Secondary | ICD-10-CM | POA: Diagnosis not present

## 2017-05-20 DIAGNOSIS — R05 Cough: Secondary | ICD-10-CM | POA: Diagnosis not present

## 2017-05-20 DIAGNOSIS — D649 Anemia, unspecified: Secondary | ICD-10-CM | POA: Diagnosis not present

## 2017-06-03 DIAGNOSIS — D649 Anemia, unspecified: Secondary | ICD-10-CM | POA: Diagnosis not present

## 2017-06-03 DIAGNOSIS — I998 Other disorder of circulatory system: Secondary | ICD-10-CM | POA: Diagnosis not present

## 2017-06-03 DIAGNOSIS — Z79899 Other long term (current) drug therapy: Secondary | ICD-10-CM | POA: Diagnosis not present

## 2017-06-03 DIAGNOSIS — L0390 Cellulitis, unspecified: Secondary | ICD-10-CM | POA: Diagnosis not present

## 2017-06-03 DIAGNOSIS — J449 Chronic obstructive pulmonary disease, unspecified: Secondary | ICD-10-CM | POA: Diagnosis not present

## 2017-06-03 DIAGNOSIS — R0609 Other forms of dyspnea: Secondary | ICD-10-CM | POA: Diagnosis not present

## 2017-06-03 DIAGNOSIS — I8310 Varicose veins of unspecified lower extremity with inflammation: Secondary | ICD-10-CM | POA: Diagnosis not present

## 2017-06-03 DIAGNOSIS — R21 Rash and other nonspecific skin eruption: Secondary | ICD-10-CM | POA: Diagnosis not present

## 2017-06-03 DIAGNOSIS — R300 Dysuria: Secondary | ICD-10-CM | POA: Diagnosis not present

## 2017-06-03 DIAGNOSIS — R634 Abnormal weight loss: Secondary | ICD-10-CM | POA: Diagnosis not present

## 2017-06-06 DIAGNOSIS — Z4800 Encounter for change or removal of nonsurgical wound dressing: Secondary | ICD-10-CM | POA: Diagnosis not present

## 2017-06-06 DIAGNOSIS — Z7984 Long term (current) use of oral hypoglycemic drugs: Secondary | ICD-10-CM | POA: Diagnosis not present

## 2017-06-06 DIAGNOSIS — I872 Venous insufficiency (chronic) (peripheral): Secondary | ICD-10-CM | POA: Diagnosis not present

## 2017-06-06 DIAGNOSIS — R300 Dysuria: Secondary | ICD-10-CM | POA: Diagnosis not present

## 2017-06-06 DIAGNOSIS — M810 Age-related osteoporosis without current pathological fracture: Secondary | ICD-10-CM | POA: Diagnosis not present

## 2017-06-06 DIAGNOSIS — I679 Cerebrovascular disease, unspecified: Secondary | ICD-10-CM | POA: Diagnosis not present

## 2017-06-06 DIAGNOSIS — Z9181 History of falling: Secondary | ICD-10-CM | POA: Diagnosis not present

## 2017-06-06 DIAGNOSIS — Z7951 Long term (current) use of inhaled steroids: Secondary | ICD-10-CM | POA: Diagnosis not present

## 2017-06-06 DIAGNOSIS — J449 Chronic obstructive pulmonary disease, unspecified: Secondary | ICD-10-CM | POA: Diagnosis not present

## 2017-06-06 DIAGNOSIS — E11628 Type 2 diabetes mellitus with other skin complications: Secondary | ICD-10-CM | POA: Diagnosis not present

## 2017-06-06 DIAGNOSIS — F0390 Unspecified dementia without behavioral disturbance: Secondary | ICD-10-CM | POA: Diagnosis not present

## 2017-06-06 DIAGNOSIS — I10 Essential (primary) hypertension: Secondary | ICD-10-CM | POA: Diagnosis not present

## 2017-06-08 DIAGNOSIS — I10 Essential (primary) hypertension: Secondary | ICD-10-CM | POA: Diagnosis not present

## 2017-06-08 DIAGNOSIS — J449 Chronic obstructive pulmonary disease, unspecified: Secondary | ICD-10-CM | POA: Diagnosis not present

## 2017-06-08 DIAGNOSIS — E11628 Type 2 diabetes mellitus with other skin complications: Secondary | ICD-10-CM | POA: Diagnosis not present

## 2017-06-08 DIAGNOSIS — I872 Venous insufficiency (chronic) (peripheral): Secondary | ICD-10-CM | POA: Diagnosis not present

## 2017-06-08 DIAGNOSIS — M810 Age-related osteoporosis without current pathological fracture: Secondary | ICD-10-CM | POA: Diagnosis not present

## 2017-06-08 DIAGNOSIS — Z4800 Encounter for change or removal of nonsurgical wound dressing: Secondary | ICD-10-CM | POA: Diagnosis not present

## 2017-06-10 ENCOUNTER — Ambulatory Visit
Admission: RE | Admit: 2017-06-10 | Discharge: 2017-06-10 | Disposition: A | Discharge status: Home / Self Care | Payer: Medicare Other | Source: Ambulatory Visit | Attending: Family Medicine | Admitting: Family Medicine

## 2017-06-10 DIAGNOSIS — R911 Solitary pulmonary nodule: Secondary | ICD-10-CM | POA: Diagnosis not present

## 2017-06-11 DIAGNOSIS — J449 Chronic obstructive pulmonary disease, unspecified: Secondary | ICD-10-CM | POA: Diagnosis not present

## 2017-06-11 DIAGNOSIS — I10 Essential (primary) hypertension: Secondary | ICD-10-CM | POA: Diagnosis not present

## 2017-06-11 DIAGNOSIS — I872 Venous insufficiency (chronic) (peripheral): Secondary | ICD-10-CM | POA: Diagnosis not present

## 2017-06-11 DIAGNOSIS — M810 Age-related osteoporosis without current pathological fracture: Secondary | ICD-10-CM | POA: Diagnosis not present

## 2017-06-11 DIAGNOSIS — E11628 Type 2 diabetes mellitus with other skin complications: Secondary | ICD-10-CM | POA: Diagnosis not present

## 2017-06-11 DIAGNOSIS — Z4800 Encounter for change or removal of nonsurgical wound dressing: Secondary | ICD-10-CM | POA: Diagnosis not present

## 2017-06-12 DIAGNOSIS — D649 Anemia, unspecified: Secondary | ICD-10-CM | POA: Diagnosis not present

## 2017-06-12 DIAGNOSIS — R05 Cough: Secondary | ICD-10-CM | POA: Diagnosis not present

## 2017-06-12 DIAGNOSIS — J849 Interstitial pulmonary disease, unspecified: Secondary | ICD-10-CM | POA: Diagnosis not present

## 2017-06-15 ENCOUNTER — Other Ambulatory Visit: Payer: Self-pay

## 2017-06-15 ENCOUNTER — Emergency Department (HOSPITAL_COMMUNITY): Payer: Medicare Other

## 2017-06-15 ENCOUNTER — Encounter (HOSPITAL_COMMUNITY): Payer: Self-pay | Admitting: Emergency Medicine

## 2017-06-15 ENCOUNTER — Inpatient Hospital Stay (HOSPITAL_COMMUNITY)
Admission: EM | Admit: 2017-06-15 | Discharge: 2017-06-18 | DRG: 190 | Disposition: A | Discharge status: Skilled Nursing Facility | Payer: Medicare Other | Attending: Internal Medicine | Admitting: Internal Medicine

## 2017-06-15 DIAGNOSIS — J440 Chronic obstructive pulmonary disease with acute lower respiratory infection: Secondary | ICD-10-CM | POA: Diagnosis present

## 2017-06-15 DIAGNOSIS — Z7984 Long term (current) use of oral hypoglycemic drugs: Secondary | ICD-10-CM

## 2017-06-15 DIAGNOSIS — F0390 Unspecified dementia without behavioral disturbance: Secondary | ICD-10-CM | POA: Diagnosis present

## 2017-06-15 DIAGNOSIS — D509 Iron deficiency anemia, unspecified: Secondary | ICD-10-CM | POA: Diagnosis present

## 2017-06-15 DIAGNOSIS — Z87891 Personal history of nicotine dependence: Secondary | ICD-10-CM | POA: Diagnosis not present

## 2017-06-15 DIAGNOSIS — R2681 Unsteadiness on feet: Secondary | ICD-10-CM | POA: Diagnosis not present

## 2017-06-15 DIAGNOSIS — N183 Chronic kidney disease, stage 3 unspecified: Secondary | ICD-10-CM | POA: Diagnosis present

## 2017-06-15 DIAGNOSIS — M545 Low back pain: Secondary | ICD-10-CM | POA: Diagnosis not present

## 2017-06-15 DIAGNOSIS — B9729 Other coronavirus as the cause of diseases classified elsewhere: Secondary | ICD-10-CM | POA: Diagnosis present

## 2017-06-15 DIAGNOSIS — W1830XA Fall on same level, unspecified, initial encounter: Secondary | ICD-10-CM | POA: Diagnosis present

## 2017-06-15 DIAGNOSIS — R413 Other amnesia: Secondary | ICD-10-CM | POA: Diagnosis present

## 2017-06-15 DIAGNOSIS — M25551 Pain in right hip: Secondary | ICD-10-CM | POA: Diagnosis not present

## 2017-06-15 DIAGNOSIS — J441 Chronic obstructive pulmonary disease with (acute) exacerbation: Secondary | ICD-10-CM

## 2017-06-15 DIAGNOSIS — E1140 Type 2 diabetes mellitus with diabetic neuropathy, unspecified: Secondary | ICD-10-CM | POA: Diagnosis present

## 2017-06-15 DIAGNOSIS — I809 Phlebitis and thrombophlebitis of unspecified site: Secondary | ICD-10-CM | POA: Diagnosis present

## 2017-06-15 DIAGNOSIS — E785 Hyperlipidemia, unspecified: Secondary | ICD-10-CM | POA: Diagnosis present

## 2017-06-15 DIAGNOSIS — Y92009 Unspecified place in unspecified non-institutional (private) residence as the place of occurrence of the external cause: Secondary | ICD-10-CM | POA: Diagnosis not present

## 2017-06-15 DIAGNOSIS — B999 Unspecified infectious disease: Secondary | ICD-10-CM | POA: Diagnosis not present

## 2017-06-15 DIAGNOSIS — J9621 Acute and chronic respiratory failure with hypoxia: Secondary | ICD-10-CM | POA: Diagnosis present

## 2017-06-15 DIAGNOSIS — Z881 Allergy status to other antibiotic agents status: Secondary | ICD-10-CM | POA: Diagnosis not present

## 2017-06-15 DIAGNOSIS — L0390 Cellulitis, unspecified: Secondary | ICD-10-CM | POA: Diagnosis present

## 2017-06-15 DIAGNOSIS — J42 Unspecified chronic bronchitis: Secondary | ICD-10-CM | POA: Diagnosis not present

## 2017-06-15 DIAGNOSIS — Z9981 Dependence on supplemental oxygen: Secondary | ICD-10-CM | POA: Diagnosis not present

## 2017-06-15 DIAGNOSIS — M810 Age-related osteoporosis without current pathological fracture: Secondary | ICD-10-CM | POA: Diagnosis present

## 2017-06-15 DIAGNOSIS — Z79899 Other long term (current) drug therapy: Secondary | ICD-10-CM

## 2017-06-15 DIAGNOSIS — J189 Pneumonia, unspecified organism: Secondary | ICD-10-CM

## 2017-06-15 DIAGNOSIS — E1122 Type 2 diabetes mellitus with diabetic chronic kidney disease: Secondary | ICD-10-CM | POA: Diagnosis present

## 2017-06-15 DIAGNOSIS — W228XXA Striking against or struck by other objects, initial encounter: Secondary | ICD-10-CM | POA: Diagnosis present

## 2017-06-15 DIAGNOSIS — S79911A Unspecified injury of right hip, initial encounter: Secondary | ICD-10-CM | POA: Diagnosis not present

## 2017-06-15 DIAGNOSIS — R0781 Pleurodynia: Secondary | ICD-10-CM | POA: Diagnosis not present

## 2017-06-15 DIAGNOSIS — Z9181 History of falling: Secondary | ICD-10-CM | POA: Diagnosis not present

## 2017-06-15 DIAGNOSIS — T380X5A Adverse effect of glucocorticoids and synthetic analogues, initial encounter: Secondary | ICD-10-CM | POA: Diagnosis present

## 2017-06-15 DIAGNOSIS — R488 Other symbolic dysfunctions: Secondary | ICD-10-CM | POA: Diagnosis not present

## 2017-06-15 DIAGNOSIS — I130 Hypertensive heart and chronic kidney disease with heart failure and stage 1 through stage 4 chronic kidney disease, or unspecified chronic kidney disease: Secondary | ICD-10-CM | POA: Diagnosis present

## 2017-06-15 DIAGNOSIS — R918 Other nonspecific abnormal finding of lung field: Secondary | ICD-10-CM | POA: Diagnosis present

## 2017-06-15 DIAGNOSIS — Z66 Do not resuscitate: Secondary | ICD-10-CM | POA: Diagnosis present

## 2017-06-15 DIAGNOSIS — S299XXA Unspecified injury of thorax, initial encounter: Secondary | ICD-10-CM | POA: Diagnosis not present

## 2017-06-15 DIAGNOSIS — S3992XA Unspecified injury of lower back, initial encounter: Secondary | ICD-10-CM | POA: Diagnosis not present

## 2017-06-15 DIAGNOSIS — M6281 Muscle weakness (generalized): Secondary | ICD-10-CM | POA: Diagnosis not present

## 2017-06-15 DIAGNOSIS — S3993XA Unspecified injury of pelvis, initial encounter: Secondary | ICD-10-CM | POA: Diagnosis not present

## 2017-06-15 DIAGNOSIS — G912 (Idiopathic) normal pressure hydrocephalus: Secondary | ICD-10-CM | POA: Diagnosis present

## 2017-06-15 DIAGNOSIS — E119 Type 2 diabetes mellitus without complications: Secondary | ICD-10-CM

## 2017-06-15 DIAGNOSIS — Z882 Allergy status to sulfonamides status: Secondary | ICD-10-CM

## 2017-06-15 DIAGNOSIS — E118 Type 2 diabetes mellitus with unspecified complications: Secondary | ICD-10-CM | POA: Diagnosis not present

## 2017-06-15 DIAGNOSIS — J449 Chronic obstructive pulmonary disease, unspecified: Secondary | ICD-10-CM | POA: Diagnosis not present

## 2017-06-15 DIAGNOSIS — R0902 Hypoxemia: Secondary | ICD-10-CM | POA: Diagnosis not present

## 2017-06-15 DIAGNOSIS — I10 Essential (primary) hypertension: Secondary | ICD-10-CM | POA: Diagnosis present

## 2017-06-15 DIAGNOSIS — K219 Gastro-esophageal reflux disease without esophagitis: Secondary | ICD-10-CM | POA: Diagnosis not present

## 2017-06-15 DIAGNOSIS — R262 Difficulty in walking, not elsewhere classified: Secondary | ICD-10-CM | POA: Diagnosis present

## 2017-06-15 DIAGNOSIS — W19XXXA Unspecified fall, initial encounter: Secondary | ICD-10-CM

## 2017-06-15 DIAGNOSIS — Z7951 Long term (current) use of inhaled steroids: Secondary | ICD-10-CM

## 2017-06-15 DIAGNOSIS — I129 Hypertensive chronic kidney disease with stage 1 through stage 4 chronic kidney disease, or unspecified chronic kidney disease: Secondary | ICD-10-CM | POA: Diagnosis not present

## 2017-06-15 HISTORY — DX: Age-related osteoporosis without current pathological fracture: M81.0

## 2017-06-15 HISTORY — DX: Malignant neoplasm of unspecified site of unspecified female breast: C50.919

## 2017-06-15 HISTORY — DX: Anemia, unspecified: D64.9

## 2017-06-15 HISTORY — DX: (Idiopathic) normal pressure hydrocephalus: G91.2

## 2017-06-15 LAB — RESPIRATORY PANEL BY PCR
Adenovirus: NOT DETECTED
Bordetella pertussis: NOT DETECTED
CORONAVIRUS 229E-RVPPCR: NOT DETECTED
CORONAVIRUS HKU1-RVPPCR: NOT DETECTED
CORONAVIRUS OC43-RVPPCR: DETECTED — AB
Chlamydophila pneumoniae: NOT DETECTED
Coronavirus NL63: NOT DETECTED
INFLUENZA B-RVPPCR: NOT DETECTED
Influenza A: NOT DETECTED
METAPNEUMOVIRUS-RVPPCR: NOT DETECTED
MYCOPLASMA PNEUMONIAE-RVPPCR: NOT DETECTED
PARAINFLUENZA VIRUS 1-RVPPCR: NOT DETECTED
PARAINFLUENZA VIRUS 2-RVPPCR: NOT DETECTED
Parainfluenza Virus 3: NOT DETECTED
Parainfluenza Virus 4: NOT DETECTED
RESPIRATORY SYNCYTIAL VIRUS-RVPPCR: NOT DETECTED
Rhinovirus / Enterovirus: NOT DETECTED

## 2017-06-15 LAB — BASIC METABOLIC PANEL WITH GFR
Anion gap: 10 (ref 5–15)
BUN: 15 mg/dL (ref 6–20)
CO2: 29 mmol/L (ref 22–32)
Calcium: 9 mg/dL (ref 8.9–10.3)
Chloride: 93 mmol/L — ABNORMAL LOW (ref 101–111)
Creatinine, Ser: 1.03 mg/dL — ABNORMAL HIGH (ref 0.44–1.00)
GFR calc Af Amer: 54 mL/min — ABNORMAL LOW (ref >60)
GFR calc non Af Amer: 46 mL/min — ABNORMAL LOW (ref >60)
Glucose, Bld: 137 mg/dL — ABNORMAL HIGH (ref 65–99)
Potassium: 3.7 mmol/L (ref 3.5–5.1)
Sodium: 132 mmol/L — ABNORMAL LOW (ref 135–145)

## 2017-06-15 LAB — CBC
HCT: 29 % — ABNORMAL LOW (ref 36.0–46.0)
Hemoglobin: 8.6 g/dL — ABNORMAL LOW (ref 12.0–15.0)
MCH: 22.2 pg — ABNORMAL LOW (ref 26.0–34.0)
MCHC: 29.7 g/dL — ABNORMAL LOW (ref 30.0–36.0)
MCV: 74.7 fL — ABNORMAL LOW (ref 78.0–100.0)
Platelets: 316 K/uL (ref 150–400)
RBC: 3.88 MIL/uL (ref 3.87–5.11)
RDW: 15.7 % — ABNORMAL HIGH (ref 11.5–15.5)
WBC: 5.1 K/uL (ref 4.0–10.5)

## 2017-06-15 LAB — GLUCOSE, CAPILLARY
GLUCOSE-CAPILLARY: 313 mg/dL — AB (ref 65–99)
Glucose-Capillary: 235 mg/dL — ABNORMAL HIGH (ref 65–99)

## 2017-06-15 LAB — CBG MONITORING, ED: GLUCOSE-CAPILLARY: 154 mg/dL — AB (ref 65–99)

## 2017-06-15 MED ORDER — ACETAMINOPHEN 325 MG PO TABS
650.0000 mg | ORAL_TABLET | Freq: Four times a day (QID) | ORAL | Status: DC | PRN
  Administered 2017-06-16 – 2017-06-18 (×4): 650 mg via ORAL
  Filled 2017-06-15 (×4): qty 2

## 2017-06-15 MED ORDER — IPRATROPIUM-ALBUTEROL 0.5-2.5 (3) MG/3ML IN SOLN
3.0000 mL | Freq: Once | RESPIRATORY_TRACT | Status: AC
  Administered 2017-06-15: 3 mL via RESPIRATORY_TRACT
  Filled 2017-06-15: qty 3

## 2017-06-15 MED ORDER — ENOXAPARIN SODIUM 40 MG/0.4ML ~~LOC~~ SOLN: 40.0000 mg | SUBCUTANEOUS | Status: DC

## 2017-06-15 MED ORDER — ALBUTEROL SULFATE (2.5 MG/3ML) 0.083% IN NEBU
2.5000 mg | INHALATION_SOLUTION | RESPIRATORY_TRACT | Status: DC | PRN
  Administered 2017-06-15: 2.5 mg via RESPIRATORY_TRACT
  Filled 2017-06-15: qty 3

## 2017-06-15 MED ORDER — FUROSEMIDE 40 MG PO TABS
40.0000 mg | ORAL_TABLET | Freq: Every day | ORAL | Status: DC
  Administered 2017-06-15 – 2017-06-18 (×4): 40 mg via ORAL
  Filled 2017-06-15: qty 1
  Filled 2017-06-15: qty 2
  Filled 2017-06-15 (×2): qty 1

## 2017-06-15 MED ORDER — POLYETHYLENE GLYCOL 3350 17 G PO PACK
17.0000 g | PACK | Freq: Every day | ORAL | Status: DC
  Administered 2017-06-17 – 2017-06-18 (×2): 17 g via ORAL
  Filled 2017-06-15 (×3): qty 1

## 2017-06-15 MED ORDER — INSULIN ASPART 100 UNIT/ML ~~LOC~~ SOLN
0.0000 [IU] | Freq: Every day | SUBCUTANEOUS | Status: DC
  Administered 2017-06-15 – 2017-06-16 (×2): 4 [IU] via SUBCUTANEOUS

## 2017-06-15 MED ORDER — ACETAMINOPHEN 650 MG RE SUPP: 650.0000 mg | Freq: Four times a day (QID) | RECTAL | Status: DC | PRN

## 2017-06-15 MED ORDER — ACETAMINOPHEN 500 MG PO TABS
500.0000 mg | ORAL_TABLET | Freq: Once | ORAL | Status: AC
  Administered 2017-06-15: 500 mg via ORAL
  Filled 2017-06-15: qty 1

## 2017-06-15 MED ORDER — MAGNESIUM OXIDE 400 (241.3 MG) MG PO TABS
200.0000 mg | ORAL_TABLET | Freq: Every day | ORAL | Status: DC
  Administered 2017-06-15 – 2017-06-17 (×3): 200 mg via ORAL
  Filled 2017-06-15 (×7): qty 1

## 2017-06-15 MED ORDER — MORPHINE SULFATE (PF) 4 MG/ML IV SOLN: 2.0000 mg | INTRAVENOUS | Status: DC | PRN

## 2017-06-15 MED ORDER — SUCRALFATE 1 G PO TABS
1.0000 g | ORAL_TABLET | Freq: Two times a day (BID) | ORAL | Status: DC
  Administered 2017-06-15 – 2017-06-18 (×7): 1 g via ORAL
  Filled 2017-06-15 (×7): qty 1

## 2017-06-15 MED ORDER — ATORVASTATIN CALCIUM 10 MG PO TABS
10.0000 mg | ORAL_TABLET | Freq: Every day | ORAL | Status: DC
  Administered 2017-06-15 – 2017-06-18 (×4): 10 mg via ORAL
  Filled 2017-06-15 (×4): qty 1

## 2017-06-15 MED ORDER — HYDROCODONE-ACETAMINOPHEN 5-325 MG PO TABS
1.0000 | ORAL_TABLET | ORAL | Status: DC | PRN
  Administered 2017-06-15: 1 via ORAL
  Filled 2017-06-15: qty 1

## 2017-06-15 MED ORDER — LORATADINE 10 MG PO TABS
10.0000 mg | ORAL_TABLET | Freq: Every day | ORAL | Status: DC
  Administered 2017-06-15 – 2017-06-18 (×4): 10 mg via ORAL
  Filled 2017-06-15 (×4): qty 1

## 2017-06-15 MED ORDER — FLUTICASONE PROPIONATE 50 MCG/ACT NA SUSP
2.0000 | Freq: Every day | NASAL | Status: DC
  Administered 2017-06-15 – 2017-06-18 (×4): 2 via NASAL
  Filled 2017-06-15: qty 16

## 2017-06-15 MED ORDER — MEMANTINE HCL 10 MG PO TABS: 10.0000 mg | ORAL_TABLET | Freq: Two times a day (BID) | ORAL | Status: DC

## 2017-06-15 MED ORDER — FENTANYL CITRATE (PF) 100 MCG/2ML IJ SOLN
25.0000 ug | Freq: Once | INTRAMUSCULAR | Status: AC
  Administered 2017-06-15: 25 ug via INTRAVENOUS
  Filled 2017-06-15: qty 2

## 2017-06-15 MED ORDER — INSULIN ASPART 100 UNIT/ML ~~LOC~~ SOLN
0.0000 [IU] | Freq: Three times a day (TID) | SUBCUTANEOUS | Status: DC
  Administered 2017-06-15: 2 [IU] via SUBCUTANEOUS
  Administered 2017-06-15: 3 [IU] via SUBCUTANEOUS
  Administered 2017-06-16: 2 [IU] via SUBCUTANEOUS
  Administered 2017-06-16 (×2): 3 [IU] via SUBCUTANEOUS
  Administered 2017-06-17: 5 [IU] via SUBCUTANEOUS
  Administered 2017-06-17: 7 [IU] via SUBCUTANEOUS
  Filled 2017-06-15: qty 1

## 2017-06-15 MED ORDER — GABAPENTIN 300 MG PO CAPS
300.0000 mg | ORAL_CAPSULE | Freq: Every day | ORAL | Status: DC
  Administered 2017-06-15 – 2017-06-17 (×3): 300 mg via ORAL
  Filled 2017-06-15 (×3): qty 1

## 2017-06-15 MED ORDER — DILTIAZEM HCL ER COATED BEADS 180 MG PO CP24: 180.0000 mg | ORAL_CAPSULE | Freq: Every day | ORAL | Status: DC

## 2017-06-15 MED ORDER — ENOXAPARIN SODIUM 40 MG/0.4ML ~~LOC~~ SOLN
40.0000 mg | SUBCUTANEOUS | Status: DC
  Administered 2017-06-15 – 2017-06-18 (×4): 40 mg via SUBCUTANEOUS
  Filled 2017-06-15 (×4): qty 0.4

## 2017-06-15 MED ORDER — ONDANSETRON HCL 4 MG PO TABS: 4.0000 mg | ORAL_TABLET | Freq: Four times a day (QID) | ORAL | Status: DC | PRN

## 2017-06-15 MED ORDER — DOCUSATE SODIUM 100 MG PO CAPS
100.0000 mg | ORAL_CAPSULE | Freq: Two times a day (BID) | ORAL | Status: DC
  Administered 2017-06-15 – 2017-06-18 (×7): 100 mg via ORAL
  Filled 2017-06-15 (×7): qty 1

## 2017-06-15 MED ORDER — PREDNISONE 20 MG PO TABS
40.0000 mg | ORAL_TABLET | Freq: Every day | ORAL | Status: DC
  Administered 2017-06-16 – 2017-06-17 (×2): 40 mg via ORAL
  Filled 2017-06-15 (×2): qty 2

## 2017-06-15 MED ORDER — POLYSACCHARIDE IRON COMPLEX 150 MG PO CAPS: 150.0000 mg | ORAL_CAPSULE | Freq: Every day | ORAL | Status: DC

## 2017-06-15 MED ORDER — LISINOPRIL 10 MG PO TABS
10.0000 mg | ORAL_TABLET | Freq: Every day | ORAL | Status: DC
  Administered 2017-06-15 – 2017-06-18 (×4): 10 mg via ORAL
  Filled 2017-06-15 (×4): qty 1

## 2017-06-15 MED ORDER — FLUTICASONE PROPIONATE 50 MCG/ACT NA SUSP: 2.0000 | Freq: Every day | NASAL | Status: DC

## 2017-06-15 MED ORDER — IPRATROPIUM-ALBUTEROL 0.5-2.5 (3) MG/3ML IN SOLN
3.0000 mL | Freq: Four times a day (QID) | RESPIRATORY_TRACT | Status: DC
  Administered 2017-06-15 (×2): 3 mL via RESPIRATORY_TRACT
  Filled 2017-06-15 (×2): qty 3

## 2017-06-15 MED ORDER — ONDANSETRON HCL 4 MG/2ML IJ SOLN: 4.0000 mg | Freq: Four times a day (QID) | INTRAMUSCULAR | Status: DC | PRN

## 2017-06-15 MED ORDER — POLYSACCHARIDE IRON COMPLEX 150 MG PO CAPS
150.0000 mg | ORAL_CAPSULE | Freq: Every day | ORAL | Status: DC
  Administered 2017-06-15 – 2017-06-18 (×4): 150 mg via ORAL
  Filled 2017-06-15 (×4): qty 1

## 2017-06-15 MED ORDER — METHYLPREDNISOLONE SODIUM SUCC 125 MG IJ SOLR
60.0000 mg | Freq: Two times a day (BID) | INTRAMUSCULAR | Status: AC
  Administered 2017-06-15 (×2): 60 mg via INTRAVENOUS
  Filled 2017-06-15 (×2): qty 2

## 2017-06-15 MED ORDER — SODIUM CHLORIDE 0.9 % IV SOLN
100.0000 mg | Freq: Two times a day (BID) | INTRAVENOUS | Status: DC
  Administered 2017-06-15 – 2017-06-17 (×5): 100 mg via INTRAVENOUS
  Filled 2017-06-15 (×5): qty 100

## 2017-06-15 MED ORDER — FAMOTIDINE 20 MG PO TABS
20.0000 mg | ORAL_TABLET | Freq: Two times a day (BID) | ORAL | Status: DC
  Administered 2017-06-15 – 2017-06-18 (×7): 20 mg via ORAL
  Filled 2017-06-15 (×7): qty 1

## 2017-06-15 MED ORDER — METHYLPREDNISOLONE SODIUM SUCC 125 MG IJ SOLR
125.0000 mg | Freq: Once | INTRAMUSCULAR | Status: AC
  Administered 2017-06-15: 125 mg via INTRAVENOUS
  Filled 2017-06-15: qty 2

## 2017-06-15 MED ORDER — MEMANTINE HCL 10 MG PO TABS
10.0000 mg | ORAL_TABLET | Freq: Two times a day (BID) | ORAL | Status: DC
  Administered 2017-06-15 – 2017-06-18 (×7): 10 mg via ORAL
  Filled 2017-06-15 (×8): qty 1

## 2017-06-15 MED ORDER — DILTIAZEM HCL ER COATED BEADS 180 MG PO CP24
180.0000 mg | ORAL_CAPSULE | Freq: Every day | ORAL | Status: DC
  Administered 2017-06-15 – 2017-06-18 (×4): 180 mg via ORAL
  Filled 2017-06-15 (×4): qty 1

## 2017-06-15 NOTE — ED Notes (Signed)
Carb modified meal tray ordered for pt at this time

## 2017-06-15 NOTE — ED Notes (Signed)
Admit Provider at bedside. 

## 2017-06-15 NOTE — ED Notes (Signed)
Patient transported to X-ray 

## 2017-06-15 NOTE — H&P (Signed)
History and Physical    Candice Miller ZOX:[id number] DOB: [birth year] DOA: 06/15/2017  PCP: Lawerance Cruel, MD Consultants:  None Patient coming from:   Home - lives with daughter and son-in-law; NOK: Daughter, [phone]  Chief Complaint: fall  HPI: Candice Miller is a 84 y.o. female with medical history significant of HTN; DM; COPD; and ? Cancer presenting after a mechanical fall.  Patient fell this AM going to the bathroom and she couldn't get up.  She hit the door casing with her right ribs, hip, and side.  This was about 3AM.  She had imaging and no fractures.  She saw Dr. Harrington Challenger last Friday about her breathing - has been on steroids and antibiotics since January off and on.  She also had cellulitis/phlebitis and had Unna boots, which were due to be changed today and have been removed.  The breathing has still been not too good.  On/off fever with fever to 102.4 on Friday night.  Improved fever to 101 after Tylenol.  Better Saturday, then 102.1 on Sunday.  They have a pulse ox at home and the sats dropped to 88% Friday night.  It has been in the 90-91% range since.  +cough, occasionally productive of clear sputum.     ED Course:   COPD exacerbation with new O2 requirement.  Came in after a mechanical fall, imaging negative.  Recent treatment for URI with antibiotics and steroids.  88% on RA, still wheezing but low 90s on 2L O2.  Review of Systems: As per HPI; otherwise review of systems reviewed and negative.   Ambulatory Status:  Ambulates with a walker  Past Medical History:  Diagnosis Date  . Anemia   . Breast cancer ([place]) 1994  . COPD (chronic obstructive pulmonary disease) ([place])   . Diabetes mellitus without complication ([place])    diet controlled vs. on glimepride  . Hypertension   . NPH (normal pressure hydrocephalus)   . Osteoporosis     Past Surgical History:  Procedure Laterality Date  . CHOLECYSTECTOMY    . COLONOSCOPY WITH PROPOFOL N/A 01/01/2017   Procedure:  COLONOSCOPY WITH PROPOFOL;  Surgeon: Laurence Spates, MD;  Location: [facility];  Service: Endoscopy;  Laterality: N/A;  . MASTECTOMY  1994  . TUBAL LIGATION      Social History   Socioeconomic History  . Marital status: Widowed    Spouse name: Not on file  . Number of children: Not on file  . Years of education: Not on file  . Highest education level: Not on file  Social Needs  . Financial resource strain: Not on file  . Food insecurity - worry: Not on file  . Food insecurity - inability: Not on file  . Transportation needs - medical: Not on file  . Transportation needs - non-medical: Not on file  Occupational History  . Occupation: retired  Tobacco Use  . Smoking status: Never Smoker  . Smokeless tobacco: Former Systems developer    Types: Snuff, Chew  . Tobacco comment: lived with smokers most of her life; oral tobacco for 10 years?  Substance and Sexual Activity  . Alcohol use: No  . Drug use: No  . Sexual activity: Not on file  Other Topics Concern  . Not on file  Social History Narrative  . Not on file    Allergies  Allergen Reactions  . Keflex [Cephalexin]   . Other     Pt reports "microdentin"  . Sulfa Antibiotics     Family History  Problem Relation Age of Onset  . Hypertension Mother   . Diabetes Mother   . Heart failure Mother   . Stroke Mother   . Hypertension Father   . Diabetes Father   . Heart failure Father   . Stroke Father     Prior to Admission medications   Medication Sig Start Date End Date Taking? Authorizing Provider  albuterol (PROVENTIL HFA;VENTOLIN HFA) 108 (90 Base) MCG/ACT inhaler Inhale 2 puffs into the lungs every 6 (six) hours as needed for wheezing.    Yes [provider]  ammonium lactate (LAC-HYDRIN) 12 % lotion Apply 1 application topically 2 (two) times daily.   Yes [provider]  atorvastatin (LIPITOR) 10 MG tablet Take 10 mg by mouth daily.   Yes [provider]  budesonide-formoterol (SYMBICORT)  80-4.5 MCG/ACT inhaler Inhale 2 puffs into the lungs 2 (two) times daily.   Yes [provider]  Calcium 150 MG TABS Take 150 mg by mouth daily.   Yes [provider]  cholecalciferol (VITAMIN D) 1000 units tablet Take 1,000 Units by mouth daily.   Yes [provider]  diltiazem (CARDIZEM CD) 180 MG 24 hr capsule Take 180 mg by mouth daily.   Yes [provider]  fexofenadine (ALLEGRA) 180 MG tablet Take 180 mg by mouth daily.   Yes [provider]  fluticasone (FLONASE) 50 MCG/ACT nasal spray Place 2 sprays into both nostrils daily.   Yes [provider]  furosemide (LASIX) 40 MG tablet Take 40 mg by mouth daily.   Yes [provider]  gabapentin (NEURONTIN) 300 MG capsule Take 300 mg by mouth at bedtime.    Yes [provider]  glimepiride (AMARYL) 1 MG tablet Take 1 mg by mouth daily with breakfast.   Yes [provider]  ipratropium-albuterol (DUONEB) 0.5-2.5 (3) MG/3ML SOLN Take 3 mLs by nebulization 4 (four) times daily.    Yes [provider]  iron polysaccharides (NIFEREX) 150 MG capsule Take 150 mg by mouth daily.   Yes [provider]  l-methylfolate-B6-B12 (METANX) 3-35-2 MG TABS tablet Take 1 tablet by mouth 2 (two) times daily.    Yes [provider]  levofloxacin (LEVAQUIN) 500 MG tablet Take 500 mg by mouth daily. 06/12/17  Yes [provider]  lisinopril (PRINIVIL,ZESTRIL) 10 MG tablet Take 10 mg by mouth daily.   Yes [provider]  Magnesium 250 MG TABS Take 250 mg by mouth at bedtime.   Yes [provider]  memantine (NAMENDA) 10 MG tablet Take 10 mg by mouth 2 (two) times daily.   Yes [provider]  nitroGLYCERIN (NITROSTAT) 0.4 MG SL tablet Place 0.4 mg under the tongue every 5 (five) minutes as needed for chest pain.   Yes [provider]  Omega-3 Fatty Acids (FISH OIL) 1000 MG CAPS Take 1,000 mg by mouth daily.   Yes  [provider]  polyethylene glycol (MIRALAX / GLYCOLAX) packet Take 17 g by mouth daily. 01/01/17  Yes Florencia Reasons, MD  ranitidine (ZANTAC) 150 MG tablet Take 150 mg by mouth 2 (two) times daily.   Yes [provider]  sucralfate (CARAFATE) 1 g tablet Take 1 g by mouth 2 (two) times daily.   Yes [provider]    Physical Exam: Vitals:   06/15/17 0800 06/15/17 0815 06/15/17 0830 06/15/17 0915  BP: 137/64 (!) 141/60 137/62 (!) 130/47  Pulse: 73 74 74 71  Resp: 20 18 19 20   Temp:  TempSrc:      SpO2: 98% 98% 98% 99%  Weight:      Height:         General:  Appears calm and comfortable and is NAD Eyes:  PERRL, EOMI, normal lids, iris ENT:  grossly normal hearing, lips & tongue, mmm Neck:  no LAD, masses or thyromegaly Cardiovascular:  RRR, no m/r/g. No LE edema.  Respiratory:  Expiratory wheezes with increased expiratory time. Normal respiratory effort. Abdomen:  soft, NT, ND, NABS Skin:  no rash or induration seen on limited exam Musculoskeletal:  grossly normal tone BUE/BLE, good ROM, no bony abnormality Lower extremity:  No LE edema. No evident cellulitis at this time. 2+ distal pulses. Psychiatric:  grossly normal mood and affect, speech fluent and appropriate, AOx3 Neurologic:  CN 2-12 grossly intact, moves all extremities in coordinated fashion, sensation intact    Radiological Exams on Admission: Dg Ribs Unilateral W/chest Right  Result Date: 06/15/2017 CLINICAL DATA:  Right rib pain after fall going to the bathroom tonight. EXAM: RIGHT RIBS AND CHEST - 3+ VIEW COMPARISON:  Chest CT 06/10/2017 FINDINGS: No fracture or other bone lesions are seen involving the ribs. There is no evidence of pneumothorax or pleural effusion. Chronic lung disease with interstitial coarsening. Heart size and mediastinal contours are within normal limits. There is aortic atherosclerosis. IMPRESSION: 1. No right rib fracture or pulmonary complication. 2. Chronic lung  disease appears similar to prior CT. Electronically Signed   By: Jeb Levering M.D.   On: 06/15/2017 05:30   Ct Lumbar Spine Wo Contrast  Result Date: 06/15/2017 CLINICAL DATA:  Fall last night, hip and back pain EXAM: CT LUMBAR SPINE WITHOUT CONTRAST TECHNIQUE: Multidetector CT imaging of the lumbar spine was performed without intravenous contrast administration. Multiplanar CT image reconstructions were also generated. COMPARISON:  Plain film of the lumbar spine dated 06/11/2016. FINDINGS: Segmentation: 5 lumbar type vertebrae. Alignment: Mild levoscoliosis centered at the L2-3 level. No evidence of acute vertebral body subluxation. Vertebrae: Chronic wedge compression deformity of the L1 vertebral body, stable compared to the previous plain film of 06/11/2016. No evidence of acute fracture. No fracture line or displaced fracture fragment. Facet joints appear intact and normally aligned throughout. Small chronic appearing deformity of the left L2 transverse process. Paraspinal and other soft tissues: Aortic atherosclerosis. Visualized paravertebral soft tissues are otherwise unremarkable. Disc levels: Disc desiccations at the T12-L1 and L1-2 L2 levels, mild to moderate in degree with associated disc space narrowing, vacuum disc phenomenon and mild osseous spurring. Mild disc bulge at L4-5, combined with degenerative hypertrophy of the ligamentum flavum, causing mild to moderate central canal stenosis with possible associated lateral recess narrowings. No more than mild central canal stenosis at any other level of the lumbar spine. IMPRESSION: 1. No acute findings.  No acute fracture or dislocation. 2. Chronic wedge compression deformity of the L1 vertebral body, stable compared to previous plain film of 06/11/2016. No associated vertebral body retropulsion. 3. Degenerative changes within the upper and lower lumbar spine, with associated disc bulge at L4-5 causing mild to moderate central canal stenosis.  Electronically Signed   By: Franki Cabot M.D.   On: 06/15/2017 07:43   Ct Pelvis Wo Contrast  Result Date: 06/15/2017 CLINICAL DATA:  Fall last night, hip and back pain. EXAM: CT PELVIS WITHOUT CONTRAST TECHNIQUE: Multidetector CT imaging of the pelvis was performed following the standard protocol without intravenous contrast. COMPARISON:  None. FINDINGS: Urinary Tract:  Bladder and distal ureters are unremarkable. Bowel:  Extensive diverticulosis of the sigmoid colon and lower descending colon but no focal inflammatory change to suggest acute diverticulitis. No dilated large or small bowel loops within the pelvis or lower abdomen. Vascular/Lymphatic: No acute findings.  Atherosclerosis. Reproductive:  No mass or other significant abnormality Other: No free fluid or hemorrhage within the pelvis. No free intraperitoneal air. Musculoskeletal: No osseous fracture or dislocation. Superficial soft tissues about the pelvis and right hip are unremarkable. IMPRESSION: 1. No acute findings. No osseous fracture or dislocation within the pelvis or at the right hip. 2. Colonic diverticulosis without evidence of acute diverticulitis. 3. Atherosclerosis. Electronically Signed   By: Franki Cabot M.D.   On: 06/15/2017 07:35   Dg Hip Unilat With Pelvis 2-3 Views Right  Result Date: 06/15/2017 CLINICAL DATA:  Right hip pain after fall going to the bathroom tonight. EXAM: DG HIP (WITH OR WITHOUT PELVIS) 2-3V RIGHT COMPARISON:  None. FINDINGS: The cortical margins of the bony pelvis and right hip are intact. No fracture. Pubic symphysis and sacroiliac joints are congruent. Both femoral heads are well-seated in the respective acetabula. Mild for age degenerative change of both hips. IMPRESSION: Negative for fracture of the pelvis or right hip. Electronically Signed   By: Jeb Levering M.D.   On: 06/15/2017 05:28    EKG: Independently reviewed.  NSR with rate 69; nonspecific ST changes with no evidence of acute  ischemia   Labs on Admission: I have personally reviewed the available labs and imaging studies at the time of the admission.  Pertinent labs:   Na++ 132; 140 in 9/18 Glucose 137 BUN 15/Creatinine 1.03/GFR 46 - stable WBC 5.1 Hgb 8.6, prior 11.0 in 9/18   Assessment/Plan Principal Problem:   COPD with acute exacerbation (HCC) Active Problems:   Memory loss   DM (diabetes mellitus) (HCC)   HTN (hypertension)   CKD (chronic kidney disease) stage 3, GFR 30-59 ml/min (HCC)   Acute on chronic respiratory failure associated with a COPD exacerbation -Patient's shortness of breath and productive cough are most likely caused by acute COPD exacerbation.  -She has history of COPD but has not been O2-dependent -She has had recurrent respiratory infections requiring antibiotics, nebs, and steroids but has ongoing symptoms -She has reported fever at home but does not have leukocytosis. Chest x-ray is not consistent with pneumonia. -will admit patient - with her failure of outpatient therapy and mildly decreased O2 sats, it seems likely that she will need several days of hospitalization to show sufficient improvement for discharge. -Nebulizers: scheduled Duoneb and prn albuterol -Solu-Medrol 60 mg IV BID and then transition to Prednisone -IV Doxycyline (has 2/3 cardinal symptoms)  -O2 prn  Fall at home -Patient with mechanical fall at home -Perhaps related to her hypoxia and SOB, but this is not certain -Imaging negative for fractures -Vicodin/morphine prn pain  DM -A1c was 6.3 in 9/18, indicating adequate control -She apparently take Glimepiride daily but was taken off this medication by Dr. Harrington Challenger last week -Her goal A1c is probably in the 7 range and so this medication may not be further needed -Will cover with sensitive-scale SSI for now  HTN with stage 3 CKD -Continue Lisinopril for now -Creatinine appears to be stable at this time  Dementia -She is taking high-dose Namenda,  will continue    DVT prophylaxis: Lovenox  Code Status:  DNR - confirmed with patient/family Family Communication: Daughter and son-in-law present throughout evaluation Disposition Plan:  Home once clinically improved Consults called: CM/SW/PT/OT/Nutrition/RT  Admission status:  Admit - It is my clinical opinion that admission to INPATIENT is reasonable and necessary because this patient will require at least 2 midnights in the hospital to treat this condition based on the medical complexity of the problems presented.  Given the aforementioned information, the predictability of an adverse outcome is felt to be significant.    Karmen Bongo MD [facility]  If note is complete, please contact covering daytime or nighttime physician. [email or web address] Password [email or web address]  06/15/2017, 10:51 AM

## 2017-06-15 NOTE — ED Provider Notes (Signed)
[facility] EMERGENCY DEPARTMENT Provider Note   CSN: [id number] Arrival date & time: 06/15/17  0419     History   Chief Complaint Chief Complaint  Patient presents with  . Fall    HPI Candice Miller is a 84 y.o. female.  Patient presents to the emergency department with a chief complaint of mechanical fall.  She states that she was walking to the bathroom and stumbled on the door frame.  She states that she fell to the ground injuring her right side and right hip.  She has been unable to walk since the fall.  She complains of pain in her right ribs.  She is noted to be slightly hypoxic at 88%, she does not normally wear oxygen at home, but does have a history of COPD.  She denies any head injury or neck pain.  Denies loss of consciousness.  She denies being anticoagulated.  She denies any numbness, weakness, or tingling.  Denies any chest pain, shortness of breath, or dizziness.  Additional history obtained via the patient's daughter, who states that the patient has been treated for infection in her lungs as well as cellulitis on her lower extremities.  Daughter is concerned that the patient is not improving.  She states that the patient continues to run fevers to 101 and 102 degrees.  He states the patient seems less active and less alert than normal.   The history is provided by the patient. No language interpreter was used.    Past Medical History:  Diagnosis Date  . Cancer ([place])   . COPD (chronic obstructive pulmonary disease) ([place])   . Diabetes mellitus without complication ([place])   . Hypertension     Patient Active Problem List   Diagnosis Date Noted  . COPD (chronic obstructive pulmonary disease) ([place]) 12/31/2016  . DM (diabetes mellitus) ([place]) 12/31/2016  . History of breast cancer 12/31/2016  . HTN (hypertension) 12/31/2016  . Hemorrhoids 12/31/2016  . Nodule of left lung 12/31/2016  . GI bleed 12/31/2016  . Lower GI bleed 12/30/2016  . Memory  loss 10/22/2012    Past Surgical History:  Procedure Laterality Date  . CHOLECYSTECTOMY    . COLONOSCOPY WITH PROPOFOL N/A 01/01/2017   Procedure: COLONOSCOPY WITH PROPOFOL;  Surgeon: Laurence Spates, MD;  Location: [facility];  Service: Endoscopy;  Laterality: N/A;  . MASTECTOMY    . TUBAL LIGATION      OB History    No data available       Home Medications    Prior to Admission medications   Medication Sig Start Date End Date Taking? Authorizing Provider  albuterol (PROVENTIL HFA;VENTOLIN HFA) 108 (90 Base) MCG/ACT inhaler Inhale 2 puffs into the lungs.    [provider]  aspirin 81 MG chewable tablet Chew 81 mg by mouth daily.    [provider]  budesonide-formoterol (SYMBICORT) 80-4.5 MCG/ACT inhaler Inhale 2 puffs into the lungs 2 (two) times daily.    [provider]  calcium acetate (PHOSLO) 667 MG capsule Take 150 mg by mouth.    [provider]  cholecalciferol (VITAMIN D) 1000 units tablet Take 1,000 Units by mouth daily.    [provider]  diltiazem (CARDIZEM CD) 180 MG 24 hr capsule Take 180 mg by mouth daily.    [provider]  fexofenadine (ALLEGRA) 180 MG tablet Take 180 mg by mouth daily.    [provider]  fluticasone (FLONASE) 50 MCG/ACT nasal spray Place 2 sprays into both  nostrils daily.    [provider]  gabapentin (NEURONTIN) 300 MG capsule Take 300 mg by mouth.    [provider]  gemfibrozil (LOPID) 600 MG tablet Take 600 mg by mouth 2 (two) times daily before a meal.    [provider]  glimepiride (AMARYL) 1 MG tablet Take 1 mg by mouth daily with breakfast.    [provider]  ipratropium-albuterol (DUONEB) 0.5-2.5 (3) MG/3ML SOLN Take 3 mLs by nebulization.    [provider]  l-methylfolate-B6-B12 (METANX) 3-35-2 MG TABS tablet Take 1 tablet by mouth daily.    [provider]  lisinopril (PRINIVIL,ZESTRIL) 10 MG tablet Take 10 mg  by mouth daily.    [provider]  memantine (NAMENDA) 10 MG tablet Take 10 mg by mouth 2 (two) times daily.    [provider]  nitroGLYCERIN (NITRODUR - DOSED IN MG/24 HR) 0.4 mg/hr patch Place 0.4 mg onto the skin daily.    [provider]  polyethylene glycol (MIRALAX / GLYCOLAX) packet Take 17 g by mouth daily. 01/01/17   Florencia Reasons, MD  ranitidine (ZANTAC) 150 MG tablet Take 150 mg by mouth 2 (two) times daily.    [provider]    Family History Family History  Problem Relation Age of Onset  . Hypertension Mother   . Diabetes Mother   . Heart failure Mother   . Stroke Mother   . Hypertension Father   . Diabetes Father   . Heart failure Father   . Stroke Father     Social History Social History   Tobacco Use  . Smoking status: Never Smoker  . Smokeless tobacco: Never Used  Substance Use Topics  . Alcohol use: No  . Drug use: No     Allergies   Keflex [cephalexin]; Other; and Sulfa antibiotics   Review of Systems Review of Systems  All other systems reviewed and are negative.    Physical Exam Updated Vital Signs Pulse 72   Temp 98.8 F (37.1 C) (Oral)   Resp 18   Ht 5\' 4"  (1.626 m)   Wt 86.2 kg (190 lb)   SpO2 (!) 88%   BMI 32.61 kg/m   Physical Exam  Constitutional: She is oriented to person, place, and time. She appears well-developed and well-nourished.  HENT:  Head: Normocephalic and atraumatic.  No evidence of traumatic head injury  Eyes: Conjunctivae and EOM are normal. Pupils are equal, round, and reactive to light.  Neck: Normal range of motion. Neck supple.  Cardiovascular: Normal rate and regular rhythm. Exam reveals no gallop and no friction rub.  No murmur heard. Pulmonary/Chest: Effort normal. No respiratory distress. She has wheezes. She has no rales. She exhibits tenderness.  Right lateral chest wall is tender to palpation  Abdominal: Soft. Bowel sounds are normal. She exhibits no distension and no  mass. There is no tenderness. There is no rebound and no guarding.  Musculoskeletal: Normal range of motion. She exhibits no edema or tenderness.  Range of motion of bilateral lower extremities is 5/5 without any guarding or pain, no bony abnormality or deformity on exam  Neurological: She is alert and oriented to person, place, and time.  Skin: Skin is warm and dry.  Psychiatric: She has a normal mood and affect. Her behavior is normal. Judgment and thought content normal.  Nursing note and vitals reviewed.    ED Treatments / Results  Labs (all labs ordered are listed, but only abnormal results are displayed)  Labs Reviewed  CBC  BASIC METABOLIC PANEL    EKG  EKG Interpretation None       Radiology Dg Ribs Unilateral W/chest Right  Result Date: 06/15/2017 CLINICAL DATA:  Right rib pain after fall going to the bathroom tonight. EXAM: RIGHT RIBS AND CHEST - 3+ VIEW COMPARISON:  Chest CT 06/10/2017 FINDINGS: No fracture or other bone lesions are seen involving the ribs. There is no evidence of pneumothorax or pleural effusion. Chronic lung disease with interstitial coarsening. Heart size and mediastinal contours are within normal limits. There is aortic atherosclerosis. IMPRESSION: 1. No right rib fracture or pulmonary complication. 2. Chronic lung disease appears similar to prior CT. Electronically Signed   By: Jeb Levering M.D.   On: 06/15/2017 05:30   Dg Hip Unilat With Pelvis 2-3 Views Right  Result Date: 06/15/2017 CLINICAL DATA:  Right hip pain after fall going to the bathroom tonight. EXAM: DG HIP (WITH OR WITHOUT PELVIS) 2-3V RIGHT COMPARISON:  None. FINDINGS: The cortical margins of the bony pelvis and right hip are intact. No fracture. Pubic symphysis and sacroiliac joints are congruent. Both femoral heads are well-seated in the respective acetabula. Mild for age degenerative change of both hips. IMPRESSION: Negative for fracture of the pelvis or right hip. Electronically  Signed   By: Jeb Levering M.D.   On: 06/15/2017 05:28    Procedures Procedures (including critical care time)  Medications Ordered in ED Medications - No data to display   Initial Impression / Assessment and Plan / ED Course  I have reviewed the triage vital signs and the nursing notes.  Pertinent labs & imaging results that were available during my care of the patient were reviewed by me and considered in my medical decision making (see chart for details).     Patient with complaint of generalized fatigue and fall.  She stumbled while going to the bathroom.  She states that she fell to the ground.  She injured her right rib and right hip.  Plain films are negative.  Patient is accompanied by her daughter, who states that she has been being treated for a upper respiratory tract infection with steroids and antibiotics, but has had no improvement.  Her daughter reports that she has been running a fever at home to 102 degrees.  She states that she does not seem to be getting any better.  Her oxygen level is noted to be 88% on room air.  This is new for the patient.  She is wheezing in bilateral lung fields.  She takes for nebulizer treatments at home, but still has had no relief of the symptoms.  Patient will likely need hospital observation for COPD exacerbation with new oxygen requirement.  Given the mechanical fall and low back and hip pain, but negative plain films, will check CT imaging to rule out occult or lumbar fracture.  Signed out to [facility], who will continue care.  Plan to admit for COPD exacerbation and hypoxia.  Final Clinical Impressions(s) / ED Diagnoses   Final diagnoses:  None    ED Discharge Orders    None       Montine Circle, PA-C 06/15/17 [place], MD 06/15/17 [phone]

## 2017-06-15 NOTE — ED Triage Notes (Signed)
Per ems they were called out to home due to a fall. She went into the bathroom and lost her balance due to walker and hit her right lower back. NO LOC, Did not hit her head. 164/70, HR 80,  HX COPD placed on 3L due to 92 stats.

## 2017-06-15 NOTE — ED Notes (Signed)
Social work bedside.

## 2017-06-15 NOTE — Care Management Note (Signed)
Case Management Note  Patient Details  Name: Jassmin Kemmerer MRN: [id number] Date of Birth: 29-Mar-1925  Subjective/Objective:                  84 y.o. female with medical history significant of HTN; DM; and COPD presenting after a mechanical fall. From home with daughter and son-in-law.  Action/Plan: Admit status INPATIENT (COPD); anticipate discharge [place].   Expected Discharge Date:  (unknown)               Expected Discharge Plan:  [place]  In-House Referral:  NA  Discharge planning Services  CM Consult  Post Acute Care Choice:    Choice offered to:     DME Arranged:    DME Agency:     HH Arranged:    HH Agency:     Status of Service:  In process, will continue to follow  If discussed at Long Length of Stay Meetings, dates discussed:    Additional Comments: PCP: Erick Blinks, RN 06/15/2017, 10:58 AM

## 2017-06-15 NOTE — ED Provider Notes (Addendum)
Care assumed from [place], [place], at shift change.  See his note for full HPI and workup.  Briefly, patient presented to the ED after mechanical fall, complaining of right hip and low back pain.  No head or neck injury.  X-rays negative, though patient complaining of persistent significant pain.  Patient also noted to be hypoxic at 88% on arrival, with past medical history of COPD.  PCP has been treating her for upper respiratory infection with antibiotics x 1 week and a dose of IM steroid without improvement.  Patient requiring 2 L nasal cannula in the ED, which is new for this patient.  Plan is for admission for COPD exacerbation with new oxygen requirement, pending CT results.   7:47 AM CT L-spine and pelvis are negative for acute pathology. Lungs with significant wheezes bilaterally. Will order another duoneb and solumedrol. Per family, her PCP if Dr. Harrington Challenger with [facility]. Will consult [facility] for admission.  8:58 AM Dr. Lorin Mercy with [facility] accepting admission.  The patient appears reasonably stabilized for admission considering the current resources, flow, and capabilities available in the ED at this time, and I doubt any other [facility] requiring further screening and/or treatment in the ED prior to admission.    Robinson, [place] N, PA-C 06/15/17 [place], [place] N, PA-C 06/15/17 [place], [place] N, PA-C 06/15/17 0488    Jola Schmidt, MD 06/16/17 [phone]

## 2017-06-15 NOTE — ED Notes (Signed)
Purewick placed by nurse and EMT.

## 2017-06-15 NOTE — ED Notes (Signed)
Placed on 2L due to O2 levels

## 2017-06-16 LAB — CBC
HCT: 26.8 % — ABNORMAL LOW (ref 36.0–46.0)
HEMOGLOBIN: 8.2 g/dL — AB (ref 12.0–15.0)
MCH: 22.9 pg — AB (ref 26.0–34.0)
MCHC: 30.6 g/dL (ref 30.0–36.0)
MCV: 74.9 fL — ABNORMAL LOW (ref 78.0–100.0)
Platelets: 331 10*3/uL (ref 150–400)
RBC: 3.58 MIL/uL — ABNORMAL LOW (ref 3.87–5.11)
RDW: 15.6 % — ABNORMAL HIGH (ref 11.5–15.5)
WBC: 3.5 10*3/uL — ABNORMAL LOW (ref 4.0–10.5)

## 2017-06-16 LAB — BASIC METABOLIC PANEL
ANION GAP: 9 (ref 5–15)
BUN: 22 mg/dL — ABNORMAL HIGH (ref 6–20)
CO2: 26 mmol/L (ref 22–32)
Calcium: 8.7 mg/dL — ABNORMAL LOW (ref 8.9–10.3)
Chloride: 96 mmol/L — ABNORMAL LOW (ref 101–111)
Creatinine, Ser: 0.98 mg/dL (ref 0.44–1.00)
GFR calc Af Amer: 57 mL/min — ABNORMAL LOW (ref >60)
GFR, EST NON AFRICAN AMERICAN: 49 mL/min — AB (ref >60)
GLUCOSE: 204 mg/dL — AB (ref 65–99)
POTASSIUM: 4 mmol/L (ref 3.5–5.1)
Sodium: 131 mmol/L — ABNORMAL LOW (ref 135–145)

## 2017-06-16 LAB — GLUCOSE, CAPILLARY
GLUCOSE-CAPILLARY: 250 mg/dL — AB (ref 65–99)
GLUCOSE-CAPILLARY: 306 mg/dL — AB (ref 65–99)
Glucose-Capillary: 194 mg/dL — ABNORMAL HIGH (ref 65–99)
Glucose-Capillary: 247 mg/dL — ABNORMAL HIGH (ref 65–99)

## 2017-06-16 MED ORDER — IPRATROPIUM-ALBUTEROL 0.5-2.5 (3) MG/3ML IN SOLN
3.0000 mL | Freq: Three times a day (TID) | RESPIRATORY_TRACT | Status: DC
  Administered 2017-06-16 – 2017-06-18 (×7): 3 mL via RESPIRATORY_TRACT
  Filled 2017-06-16 (×9): qty 3

## 2017-06-16 NOTE — Social Work (Signed)
CSW spoke with pt daughter on phone, pt daughter confirmed that pt has lived with her for 5 years and she is able to assist pt when needed as she works from home. Pt daughter states her mom has had outpatient therapies in the past. Pt daughter amenable to pt being worked up for SNF and would like to see patient benefit from therapies.   CSW to present pt and pt daughter with offers in the morning. CSW continuing to follow.  Alexander Mt, [place] Work [phone]

## 2017-06-16 NOTE — Progress Notes (Signed)
Hospitalist progress note   Candice Miller  TKZ:[id number] DOB: Jan 27, 1925 DOA: 06/15/2017 PCP: Lawerance Cruel, MD   Specialists:   Brief Narrative:  30 fem htn, hld,dm yII COPD intermittent fever admit to [facility] with fall and SOB  Assessment & Plan:   Assessment:  The primary encounter diagnosis was COPD exacerbation ([place]). Diagnoses of Fall, Fall, initial encounter, and Right hip pain were also pertinent to this visit.  Acute COPD exacerbation-coint doxy, solu-medrol changed to prednisone 40-cont [place] albuterol and flonase Would rpt CXr with a 2 view in am given reported PNA  Fall-therapy eval rec SNF.  Await resolution  dM ty ii with neuropathy-cont gabepentin 300 hs, ssi.,. amaryl 1 mg on hold  ?chf-no echo on fileon lasix at home-re-ordered 40 mg daily  Htn-cont Cardizem 180 cd daily, lisinopril 10 daily-controlled  Mild-mod dementia-cont Namenda 10   DVT prophylaxis: lovenoex  Code Status:   dnr   Family Communication:   D/w daughter on phone  Disposition Plan: inpatient   Consultants:     Procedures:     Antimicrobials:   doxy   Subjective:  Awake alert pleasant no distres claims feels a little better No chills no rigors  Objective: Vitals:   06/15/17 2049 06/15/17 2118 06/16/17 0526 06/16/17 0846  BP:  136/60 129/63   Pulse:  95 78   Resp:   16   Temp:  98.5 F (36.9 C) 98 F (36.7 C)   TempSrc:  Oral Oral   SpO2: 96% 96% 98% 90%  Weight:      Height:        Intake/Output Summary (Last 24 hours) at 06/16/2017 1109 Last data filed at 06/16/2017 0514 Gross per 24 hour  Intake 500 ml  Output 1000 ml  Net -500 ml   Filed Weights   06/15/17 0428  Weight: 86.2 kg (190 lb)    Examination:  eomi ncat s1 s 2 No rales no rhonchi abd soft, no le edema Neuro intact but hoh--no focal deficit and moves 4 limbs equally   Data Reviewed: I have personally reviewed following labs and imaging studies  CBC: Recent Labs  Lab 06/15/17 0523  06/16/17 0603  WBC 5.1 3.5*  HGB 8.6* 8.2*  HCT 29.0* 26.8*  MCV 74.7* 74.9*  PLT 316 573   Basic Metabolic Panel: Recent Labs  Lab 06/15/17 0523 06/16/17 0603  NA 132* 131*  K 3.7 4.0  CL 93* 96*  CO2 29 26  GLUCOSE 137* 204*  BUN 15 22*  CREATININE 1.03* 0.98  CALCIUM 9.0 8.7*   GFR: Estimated Creatinine Clearance: 40.5 mL/min (by C-G formula based on SCr of 0.98 mg/dL). Liver Function Tests: No results for input(s): AST, ALT, ALKPHOS, BILITOT, PROT, ALBUMIN in the last 168 hours. No results for input(s): LIPASE, AMYLASE in the last 168 hours. No results for input(s): AMMONIA in the last 168 hours. Coagulation Profile: No results for input(s): INR, PROTIME in the last 168 hours. Cardiac Enzymes: No results for input(s): CKTOTAL, CKMB, CKMBINDEX, TROPONINI in the last 168 hours. CBG: Recent Labs  Lab 06/15/17 1149 06/15/17 1700 06/15/17 2115 06/16/17 0743  GLUCAP 154* 235* 313* 194*   Urine analysis:    Component Value Date/Time   COLORURINE YELLOW 12/30/2016 1810   APPEARANCEUR CLEAR 12/30/2016 1810   LABSPEC 1.006 12/30/2016 1810   PHURINE 7.0 12/30/2016 1810   GLUCOSEU NEGATIVE 12/30/2016 1810   HGBUR SMALL (A) 12/30/2016 1810   BILIRUBINUR NEGATIVE 12/30/2016 1810   KETONESUR NEGATIVE 12/30/2016  [place] 12/30/2016 1810   NITRITE NEGATIVE 12/30/2016 1810   LEUKOCYTESUR NEGATIVE 12/30/2016 1810     Radiology Studies: Reviewed images personally in health database    Scheduled Meds: . atorvastatin  10 mg Oral q1800  . diltiazem  180 mg Oral Daily  . docusate sodium  100 mg Oral BID  . enoxaparin (LOVENOX) injection  40 mg Subcutaneous Q24H  . famotidine  20 mg Oral BID  . fluticasone  2 spray Each Nare Daily  . furosemide  40 mg Oral Daily  . gabapentin  300 mg Oral QHS  . insulin aspart  0-5 Units Subcutaneous QHS  . insulin aspart  0-9 Units Subcutaneous TID WC  . ipratropium-albuterol  3 mL Nebulization TID  . iron  polysaccharides  150 mg Oral Daily  . lisinopril  10 mg Oral Daily  . loratadine  10 mg Oral Daily  . Magnesium  200 mg Oral QHS  . memantine  10 mg Oral BID  . polyethylene glycol  17 g Oral Daily  . predniSONE  40 mg Oral Q supper  . sucralfate  1 g Oral BID   Continuous Infusions: . doxycycline (VIBRAMYCIN) IV 100 mg (06/16/17 1035)     LOS: 1 day    Time spent: [place], MD [facility]ist [facility]   If 7PM-7AM, please contact night-coverage [email or web address] Password [email or web address] 06/16/2017, 11:09 AM

## 2017-06-16 NOTE — NC FL2 (Signed)
[place] MEDICAID FL2 LEVEL OF CARE SCREENING TOOL     IDENTIFICATION  Patient Name: Candice Miller Birthdate: October 22, 1924 Sex: female Admission Date (Current Location): 06/15/2017  [facility] and [place] Number:  Herbalist and Address:  [facility] [place] [address], [place], [place] [address]      Provider Number: [id number]  Attending Physician Name and Address:  Nita Sells, MD  Relative Name and Phone Number:   [name], [phone]    Current Level of Care: Hospital Recommended Level of Care: [place] Prior Approval Number:    Date Approved/Denied:   PASRR Number:    Discharge Plan: SNF    Current Diagnoses: Patient Active Problem List   Diagnosis Date Noted  . COPD with acute exacerbation ([place]) 06/15/2017  . CKD (chronic kidney disease) stage 3, GFR 30-59 ml/min (HCC) 06/15/2017  . Fall at home, initial encounter 06/15/2017  . COPD (chronic obstructive pulmonary disease) ([place]) 12/31/2016  . DM (diabetes mellitus) ([place]) 12/31/2016  . History of breast cancer 12/31/2016  . HTN (hypertension) 12/31/2016  . Hemorrhoids 12/31/2016  . Nodule of left lung 12/31/2016  . Lower GI bleed 12/30/2016  . Memory loss 10/22/2012    Orientation RESPIRATION BLADDER Height & Weight     Self, Time, Situation, Place  Normal Incontinent, External catheter Weight: 190 lb (86.2 kg) Height:  5\' 4"  (162.6 cm)  BEHAVIORAL SYMPTOMS/MOOD NEUROLOGICAL BOWEL NUTRITION STATUS      Continent Diet(see discharge summary)  AMBULATORY STATUS COMMUNICATION OF NEEDS Skin   Limited Assist Verbally Skin abrasions([place])                       Personal Care Assistance Level of Assistance  Bathing, Feeding, Dressing Bathing Assistance: Limited assistance Feeding assistance: Independent Dressing Assistance: Limited assistance     Functional Limitations Info  Sight, Hearing, Speech Sight Info: Adequate(wears glasses) Hearing  Info: Adequate Speech Info: Adequate    SPECIAL CARE FACTORS FREQUENCY  PT (By licensed PT), OT (By licensed OT)     PT Frequency: 5x week OT Frequency: 5x week            Contractures Contractures Info: Not present    Additional Factors Info  Code Status, Allergies Code Status Info: DNR Allergies Info: KEFLEX CEPHALEXIN, OTHER, SULFA ANTIBIOTICS            Current Medications (06/16/2017):  This is the current hospital active medication list Current Facility-Administered Medications  Medication Dose Route Frequency Provider Last Rate Last Dose  . acetaminophen (TYLENOL) tablet 650 mg  650 mg Oral Q6H PRN Karmen Bongo, MD   650 mg at 06/16/17 1048   Or  . acetaminophen (TYLENOL) suppository 650 mg  650 mg Rectal Q6H PRN Karmen Bongo, MD      . albuterol (PROVENTIL) (2.5 MG/3ML) 0.083% nebulizer solution 2.5 mg  2.5 mg Nebulization Q1H PRN Karmen Bongo, MD   2.5 mg at 06/15/17 1242  . atorvastatin (LIPITOR) tablet 10 mg  10 mg Oral q1800 Karmen Bongo, MD   10 mg at 06/15/17 1737  . diltiazem (CARDIZEM CD) 24 hr capsule 180 mg  180 mg Oral Daily Hammons, Kimberly B, RPH   180 mg at 06/16/17 1038  . docusate sodium (COLACE) capsule 100 mg  100 mg Oral BID Karmen Bongo, MD   100 mg at 06/16/17 1038  . doxycycline (VIBRAMYCIN) 100 mg in sodium chloride 0.9 % 250 mL IVPB  100 mg  Intravenous BID Karmen Bongo, MD 125 mL/hr at 06/16/17 1035 100 mg at 06/16/17 1035  . enoxaparin (LOVENOX) injection 40 mg  40 mg Subcutaneous Q24H Hammons, Kimberly B, RPH   40 mg at 06/15/17 1737  . famotidine (PEPCID) tablet 20 mg  20 mg Oral BID Karmen Bongo, MD   20 mg at 06/16/17 1038  . fluticasone (FLONASE) 50 MCG/ACT nasal spray 2 spray  2 spray Each Nare Daily Hammons, Kimberly B, RPH   2 spray at 06/16/17 1039  . furosemide (LASIX) tablet 40 mg  40 mg Oral Daily Karmen Bongo, MD   40 mg at 06/16/17 1039  . gabapentin (NEURONTIN) capsule 300 mg  300 mg Oral QHS Karmen Bongo, MD   300 mg at 06/15/17 2124  . HYDROcodone-acetaminophen (NORCO/VICODIN) 5-325 MG per tablet 1 tablet  1 tablet Oral Q4H PRN Karmen Bongo, MD   1 tablet at 06/15/17 2124  . insulin aspart (novoLOG) injection 0-5 Units  0-5 Units Subcutaneous QHS Karmen Bongo, MD   4 Units at 06/15/17 2123  . insulin aspart (novoLOG) injection 0-9 Units  0-9 Units Subcutaneous TID WC Karmen Bongo, MD   2 Units at 06/16/17 0754  . ipratropium-albuterol (DUONEB) 0.5-2.5 (3) MG/3ML nebulizer solution 3 mL  3 mL Nebulization TID Karmen Bongo, MD   3 mL at 06/16/17 0846  . iron polysaccharides (NIFEREX) capsule 150 mg  150 mg Oral Daily Hammons, Kimberly B, RPH   150 mg at 06/16/17 1037  . lisinopril (PRINIVIL,ZESTRIL) tablet 10 mg  10 mg Oral Daily Karmen Bongo, MD   10 mg at 06/16/17 1037  . loratadine (CLARITIN) tablet 10 mg  10 mg Oral Daily Karmen Bongo, MD   10 mg at 06/16/17 1038  . Magnesium TABS 200 mg  200 mg Oral QHS Karmen Bongo, MD   200 mg at 06/15/17 2124  . memantine (NAMENDA) tablet 10 mg  10 mg Oral BID Hammons, Kimberly B, RPH   10 mg at 06/16/17 0754  . morphine 4 MG/ML injection 2 mg  2 mg Intravenous Q2H PRN Karmen Bongo, MD      . ondansetron [facility]) tablet 4 mg  4 mg Oral Q6H PRN Karmen Bongo, MD       Or  . ondansetron [facility]) injection 4 mg  4 mg Intravenous Q6H PRN Karmen Bongo, MD      . polyethylene glycol (MIRALAX / GLYCOLAX) packet 17 g  17 g Oral Daily Karmen Bongo, MD      . predniSONE (DELTASONE) tablet 40 mg  40 mg Oral Q supper Karmen Bongo, MD      . sucralfate (CARAFATE) tablet 1 g  1 g Oral BID Karmen Bongo, MD   1 g at 06/16/17 1039     Discharge Medications: Please see discharge summary for a list of discharge medications.  Relevant Imaging Results:  Relevant Lab Results:   Additional Information SS#  Alexander Mt, LCSWA

## 2017-06-16 NOTE — Progress Notes (Signed)
Nutrition Brief Note  RD consulted for assessment of nutritional needs/status due to COPD GOLD protocol.   Wt Readings from Last 15 Encounters:  06/15/17 190 lb (86.2 kg)  01/01/17 182 lb (82.6 kg)  06/11/16 183 lb (83 kg)   Candice Miller is a 84 y.o. female with medical history significant of HTN; DM; COPD; and ? Cancer presenting after a mechanical fall.    Pt admitted with COPD exacerbation and s/p fall.   Reviewed wt records, UBW around 180#. Spoke with pt at bedside, who reports good appetite (ate all of her breakfast this morning). Pt reports she typically consumes 2 meals per day (Breakfast: raisin bran or eggs and toast with fruit, Dinner: meat, starch, and vegetable). Pt reports she tries to consume a lot of fruit to assist with constipation. She shares that she has recently gained weight related to steroid use. She confirms continued good glycemic control at home.   Nutrition-Focused physical exam completed. Findings are no fat depletion, no muscle depletion, and mild edema.   Medications and include magnesium and prednisone. Prednisone may be contributing to high CBGS. Last Hgb A1c: 6.3, which is indicative of good control. Per H&P, pt on no DM medications PTA.  Labs reviewed: Na: 131, CBGS: 154-313 (inpatient orders for glycemic control are 0-5 units insulin aspart q HS and 0-9 units insulin aspart TID with meals).   Body mass index is 32.61 kg/m. Patient meets criteria for obesity, class I based on current BMI.   Current diet order is Carb Modified, patient is consuming approximately 100% of meals at this time. Labs and medications reviewed.   No nutrition interventions warranted at this time. If nutrition issues arise, please consult RD.   Candice Miller A. Jimmye Norman, RD, LDN, CDE Pager: [phone] After hours Pager: [phone]

## 2017-06-16 NOTE — Clinical Social Work Note (Addendum)
Clinical Social Work Assessment  Patient Details  Name: Candice Miller MRN: [id number] Date of Birth: 03/27/1925  Date of referral:  06/16/17               Reason for consult:  Facility Placement, Discharge Planning, Other (Comment Required)(COPD Protocol)                Permission sought to share information with:  Facility Contact Representative, Family Supports Permission granted to share information::   Verbal Permission granted  Name::     Rita Fortson  Agency::   SNFs  Relationship::  daughter  Contact Information:  [phone]  Housing/Transportation Living arrangements for the past 2 months:  Single Family Home Source of Information:  Patient Patient Interpreter Needed:  None Criminal Activity/Legal Involvement Pertinent to Current Situation/Hospitalization:  No - Comment as needed Significant Relationships:  Adult Children, Pets Lives with:  Adult Children Do you feel safe going back to the place where you live?  Yes Need for family participation in patient care:  Yes (Comment)(decision making; IADLs/ADLs)  Care giving concerns:  Pt lives with daughter and son in law, pt daughter has expressed to OT that she would like for pt to receive further therapeutic support.    Social Worker assessment / plan:  CSW met with pt at bedside, pt is pleasant and alert. Pt states that she lives at home with her daughter and son in law as well as two dogs, "my daughter works at home and her office is next to my room." Pt states that she had a fall which brought her into the hospital. Pt gave permission to contact her daughter Rita who she lives with. Pt then spoke with CSW about her dogs at home and required redirection, CSW to reach out to daughter to complete assessment. CSW to complete FL2, as pt daughter has stated that she may be interested in seeing SNF options.   Employment status:  Retired Insurance information:  Medicare, Other (Comment Required)(AARP Supplement) PT Recommendations:   24 Hour Supervision, Skilled Nursing Facility Information / Referral to community resources:  Skilled Nursing Facility  Patient/Family's Response to care:  Pt pleasant and amenable to CSW visit and support offered, pt enjoyed speaking with CSW about her home and family.   Patient/Family's Understanding of and Emotional Response to Diagnosis, Current Treatment, and Prognosis:  Pt expressed understanding of diagnosis, what brought her into the hospital, what her current treatment is, but did not express full understanding of OT recommendations. CSW to f/u with pt daughter regarding prognosis and next steps. Pt was smiling and pleasant during CSW visit, did not exhibit any distress and states that she feels better than she did yesterday.   Emotional Assessment Appearance:  Appears stated age Attitude/Demeanor/Rapport:  Gracious, Engaged Affect (typically observed):  Pleasant, Appropriate Orientation:  Oriented to Place, Oriented to Self, Oriented to  Time, Oriented to Situation(does take namenda for memory loss) Alcohol / Substance use:  Tobacco Use(formerly used chewing tobacco) Psych involvement (Current and /or in the community):  No (Comment)  Discharge Needs  Concerns to be addressed:  Discharge Planning Concerns, Care Coordination Readmission within the last 30 days:  No Current discharge risk:  Dependent with Mobility, Cognitively Impaired, Physical Impairment(memory loss) Barriers to Discharge:  Continued Medical Work up, Insurance Authorization    H , LCSWA 06/16/2017, 12:06 PM  

## 2017-06-16 NOTE — Evaluation (Signed)
Physical Therapy Evaluation Patient Details Name: Candice Miller MRN: [id number] DOB: 1924/05/12 Today's Date: 06/16/2017   History of Present Illness  Pt is a 84 y.o. female with PMH signfiicant for dementia, HTN, diabetes mellitus, COPD, and ? cancer presenting after mechanical fall. Imaging negative for fractures. Admitted with acue on chronic respiratory failure associated with COPD exacerbation.    Clinical Impression  Pt was able to walk a short distance in her room with RW and vitals remained stable on RA during gait despite 3/4 DOE.  Pt is very weak and deconditioned and would benefit from SNF level rehab before returning home with her family.   PT to follow acutely for deficits listed below.     Follow Up Recommendations SNF    Equipment Recommendations  None recommended by PT    Recommendations for Other Services   NA    Precautions / Restrictions Precautions Precautions: Fall Restrictions Weight Bearing Restrictions: No      Mobility  Bed Mobility Overal bed mobility: Needs Assistance Bed Mobility: Supine to Sit     Supine to sit: Supervision;HOB elevated     General bed mobility comments: Supervision and extra time needed to get EOB.  Pt using bed rail and HOB was fully elevated.    Transfers Overall transfer level: Needs assistance Equipment used: Rolling walker (2 wheeled) Transfers: Stand Pivot Transfers;Sit to/from Stand   Stand pivot transfers: Mod assist       General transfer comment: Mod assist from lower recliner chair min assist from higher bed to RW.  RW adjusted down to better fit her height.    Ambulation/Gait Ambulation/Gait assistance: Min assist Ambulation Distance (Feet): 8 Feet Assistive device: Rolling walker (2 wheeled) Gait Pattern/deviations: Step-through pattern;Shuffle;Trunk flexed     General Gait Details: Pt with slow, choppy gait pattern, reporting urinary incontinence and feeling weak.  Pt sounds like she is not very active  at baseline (gets up eats breakfast, goes to take a long mid day nap, gets up eats dinner, goes to bed).        Balance Overall balance assessment: Needs assistance Sitting-balance support: No upper extremity supported;Feet supported Sitting balance-Leahy Scale: Fair     Standing balance support: Bilateral upper extremity supported;During functional activity Standing balance-Leahy Scale: Poor Standing balance comment: Relies on external assistance B UE support.                              Pertinent Vitals/Pain Pain Assessment: No/denies pain    Home Living Family/patient expects to be discharged to:: Private residence Living Arrangements: Children Available Help at Discharge: Family;Available 24 hours/day Type of Home: House(townhome) Home Access: Stairs to enter   Entrance Stairs-Number of Steps: 1(threshold) Home Layout: One level Home Equipment: Walker - 4 wheels;Bedside commode;Tub bench      Prior Function Level of Independence: Needs assistance   Gait / Transfers Assistance Needed: Ambulates with rollator household distances  ADL's / Homemaking Assistance Needed: Daughter assists with bathing and tub transfers. At times daughter assists with dressing.  Makes coffee in the mornings. Makes simple meals on her own unless sick (then daughter assists).         Hand Dominance   Dominant Hand: Right    Extremity/Trunk Assessment   Upper Extremity Assessment Upper Extremity Assessment: Defer to OT evaluation    Lower Extremity Assessment Lower Extremity Assessment: Generalized weakness    Cervical / Trunk Assessment Cervical / Trunk Assessment: Kyphotic  Communication   Communication: No difficulties  Cognition Arousal/Alertness: Awake/alert Behavior During Therapy: WFL for tasks assessed/performed Overall Cognitive Status: History of cognitive impairments - at baseline                                 General Comments: Noted  decreased short-term memory.       General Comments General comments (skin integrity, edema, etc.): O2 sats during gait on RA stayed >90% despite DOE 3/4 with mobility.  Pt reports this is her normal level of DOE at home when she moves around.  HR high 80s-low 100s.  Pt needed strong encouragement to do more than just get OOB to the recliner chair.          Assessment/Plan    PT Assessment Patient needs continued PT services  PT Problem List Decreased strength;Decreased activity tolerance;Decreased balance;Decreased mobility;Decreased knowledge of use of DME;Cardiopulmonary status limiting activity       PT Treatment Interventions DME instruction;Gait training;Functional mobility training;Therapeutic activities;Therapeutic exercise;Balance training;Stair training;Patient/family education    PT Goals (Current goals can be found in the Care Plan section)  Acute Rehab PT Goals Patient Stated Goal: get stronger and more independent PT Goal Formulation: With patient Time For Goal Achievement: 06/30/17 Potential to Achieve Goals: Good    Frequency Min 2X/week    AM-PAC PT "6 Clicks" Daily Activity  Outcome Measure Difficulty turning over in bed (including adjusting bedclothes, sheets and blankets)?: Unable Difficulty moving from lying on back to sitting on the side of the bed? : A Lot Difficulty sitting down on and standing up from a chair with arms (e.g., wheelchair, bedside commode, etc,.)?: Unable Help needed moving to and from a bed to chair (including a wheelchair)?: A Little Help needed walking in hospital room?: A Little Help needed climbing 3-5 steps with a railing? : Total 6 Click Score: 11    End of Session   Activity Tolerance: Patient limited by fatigue Patient left: in chair;with call bell/phone within reach;with chair alarm set   PT Visit Diagnosis: Muscle weakness (generalized) (M62.81);Difficulty in walking, not elsewhere classified (R26.2)    Time:  9323-5573 PT Time Calculation (min) (ACUTE ONLY): 38 min   Charges:          Wells Guiles B. Emylie Amster, PT, DPT [phone]   PT Evaluation $PT Eval Moderate Complexity: 1 Mod PT Treatments $Therapeutic Activity: 23-37 mins   06/16/2017, 2:20 PM

## 2017-06-16 NOTE — Evaluation (Signed)
Occupational Therapy Evaluation Patient Details Name: Candice Miller MRN: [id number] DOB: 05-03-1924 Today's Date: 06/16/2017    History of Present Illness Pt is a 84 y.o. female with PMH signfiicant for dementia, HTN, diabetes mellitus, COPD, and ? cancer presenting after mechanical fall. Imaging negative for fractures. Admitted with acue on chronic respiratory failure associated with COPD exacerbation.     Clinical Impression   PTA, pt was living with her daughter and requiring assistance for bathing and dressing tasks. She ambulates with rollator and is able to make simple meals for herself when feeling well. Pt currently requires mod assist for LB ADL, min guard assist for UB ADL, mod assist for stand-pivot toilet transfers, and max assist for toileting hygiene. Pt presents with decreased activity tolerance for ADL, generalized weakness, decreased short-term memory, and shortness of breath impacting her ability to participate with ADL. See general ADL comments for vital signs throughout session. Feel pt would benefit from short-term rehabilitation at SNF level prior to returning home with her daughter. Discussed recommendation with pt and daughter and they report interest and agreement with this recommendation. Will continue to follow while admitted.    Follow Up Recommendations  SNF;Supervision/Assistance - 24 hour    Equipment Recommendations  Other (comment)(TBD at next venue of care)    Recommendations for Other Services       Precautions / Restrictions Precautions Precautions: Fall Restrictions Weight Bearing Restrictions: No      Mobility Bed Mobility Overal bed mobility: Needs Assistance Bed Mobility: Supine to Sit     Supine to sit: Mod assist     General bed mobility comments: Pt pulling on therapist arms to raise trunk from [facility]. Able to scoot forward once seated with increased time.   Transfers Overall transfer level: Needs assistance Equipment used: Rolling  walker (2 wheeled) Transfers: Stand Pivot Transfers   Stand pivot transfers: Mod assist       General transfer comment: Mod assist to power up and utilize RW safely. Able to take a few pivotal steps.     Balance Overall balance assessment: Needs assistance Sitting-balance support: No upper extremity supported;Feet supported Sitting balance-Leahy Scale: Fair     Standing balance support: Bilateral upper extremity supported;During functional activity Standing balance-Leahy Scale: Poor Standing balance comment: Relies on external assistance B UE support.                            ADL either performed or assessed with clinical judgement   ADL Overall ADL's : Needs assistance/impaired Eating/Feeding: Set up;Sitting   Grooming: Min guard;Sitting   Upper Body Bathing: Minimal assistance;Sitting   Lower Body Bathing: Moderate assistance;Sit to/from stand   Upper Body Dressing : Minimal assistance;Sitting   Lower Body Dressing: Moderate assistance;Sit to/from stand   Toilet Transfer: Moderate assistance;Stand-pivot;BSC;RW Toilet Transfer Details (indicate cue type and reason): Simulated from bed to chair. Mod assist to power up as well as stabilize and manage RW.  Toileting- Clothing Manipulation and Hygiene: Maximal assistance;Sit to/from stand Toileting - Clothing Manipulation Details (indicate cue type and reason): max assist due to inability to remove B hands from RW with stability     Functional mobility during ADLs: Moderate assistance;Rolling walker(stand-pivot taking a few steps today) General ADL Comments: Pt with decreased activity tolerance for ADL. On 2L O2 [place] on my arrival and O2 saturations 95-98%. Removed supplemental O2 during trasnfer and SpO2 90-91%. Reapplied 2L O2 via [place].     Vision Patient  Visual Report: No change from baseline Vision Assessment?: No apparent visual deficits     Perception     Praxis      Pertinent Vitals/Pain        Hand Dominance     Extremity/Trunk Assessment Upper Extremity Assessment Upper Extremity Assessment: Generalized weakness   Lower Extremity Assessment Lower Extremity Assessment: Defer to PT evaluation       Communication Communication Communication: No difficulties   Cognition Arousal/Alertness: Awake/alert Behavior During Therapy: WFL for tasks assessed/performed Overall Cognitive Status: History of cognitive impairments - at baseline                                 General Comments: Noted decreased short-term memory.    General Comments  Extensive discussion with daughter and pt concerning discharge recommendations. Pt agrees that short-term SNF would help her improve function. Daughter feels unable to care for pt at this functional level.     Exercises     Shoulder Instructions      Home Living Family/patient expects to be discharged to:: Private residence Living Arrangements: Children Available Help at Discharge: Family;Available 24 hours/day Type of Home: House(townhome) Home Access: Stairs to enter Entrance Stairs-Number of Steps: 1(threshold)   Home Layout: One level     Bathroom Shower/Tub: Teacher, early years/pre: Standard(with BSC over toilet) Bathroom Accessibility: (backs into bathroom because of awkward rollator fit)   Home Equipment: Walker - 4 wheels;Bedside commode;Tub bench          Prior Functioning/Environment Level of Independence: Needs assistance  Gait / Transfers Assistance Needed: Ambulates with rollator ADL's / Homemaking Assistance Needed: Daughter assists with bathing and tub transfers. At times daughter assists with dressing.  Makes coffee in the mornings. Makes simple meals on her own unless sick (then daughter assists).             OT Problem List: Decreased strength;Decreased activity tolerance;Impaired balance (sitting and/or standing);Decreased safety awareness;Decreased knowledge of use of DME  or AE;Decreased knowledge of precautions;Pain      OT Treatment/Interventions: Self-care/ADL training;Therapeutic exercise;Energy conservation;DME and/or AE instruction;Therapeutic activities;Patient/family education;Balance training    OT Goals(Current goals can be found in the care plan section) Acute Rehab OT Goals Patient Stated Goal: get stronger and more independent OT Goal Formulation: With patient/family Time For Goal Achievement: 06/30/17 Potential to Achieve Goals: Good ADL Goals Pt Will Perform Grooming: with set-up;sitting Pt Will Perform Lower Body Dressing: with min guard assist;sit to/from stand Pt Will Transfer to Toilet: with min assist;ambulating;bedside commode(BSC over toilet) Pt Will Perform Toileting - Clothing Manipulation and hygiene: with min guard assist;sit to/from stand Pt/caregiver will Perform Home Exercise Program: Both right and left upper extremity;Increased strength;With written HEP provided;Independently  OT Frequency: Min 2X/week   Barriers to D/C:            Co-evaluation              AM-PAC PT "6 Clicks" Daily Activity     Outcome Measure Help from another person eating meals?: A Little Help from another person taking care of personal grooming?: A Little Help from another person toileting, which includes using toliet, bedpan, or urinal?: A Lot Help from another person bathing (including washing, rinsing, drying)?: A Lot Help from another person to put on and taking off regular upper body clothing?: A Little Help from another person to put on and taking off regular lower body clothing?:  A Lot 6 Click Score: 15   End of Session Equipment Utilized During Treatment: Gait belt;Rolling walker Nurse Communication: Mobility status(OT providing fall risk bag)  Activity Tolerance: Patient tolerated treatment well Patient left: in chair;with call bell/phone within reach;with chair alarm set;with family/visitor present  OT Visit Diagnosis:  Unsteadiness on feet (R26.81);Other abnormalities of gait and mobility (R26.89);Muscle weakness (generalized) (M62.81)                Time: 0821-0901 OT Time Calculation (min): 40 min Charges:  OT General Charges $OT Visit: 1 Visit OT Evaluation $OT Eval Moderate Complexity: 1 Mod OT Treatments $Self Care/Home Management : 23-37 mins G-Codes:     Norman Herrlich, MS OTR/L  Pager: [place] A Edrick Whitehorn 06/16/2017, 9:26 AM

## 2017-06-17 ENCOUNTER — Inpatient Hospital Stay (HOSPITAL_COMMUNITY): Payer: Medicare Other

## 2017-06-17 DIAGNOSIS — E118 Type 2 diabetes mellitus with unspecified complications: Secondary | ICD-10-CM

## 2017-06-17 DIAGNOSIS — N183 Chronic kidney disease, stage 3 (moderate): Secondary | ICD-10-CM

## 2017-06-17 LAB — GLUCOSE, CAPILLARY
Glucose-Capillary: 253 mg/dL — ABNORMAL HIGH (ref 65–99)
Glucose-Capillary: 95 mg/dL (ref 65–99)
Glucose-Capillary: 239 mg/dL — ABNORMAL HIGH (ref 65–99)

## 2017-06-17 MED ORDER — INSULIN ASPART 100 UNIT/ML ~~LOC~~ SOLN
0.0000 [IU] | Freq: Every day | SUBCUTANEOUS | Status: DC
  Administered 2017-06-17: 2 [IU] via SUBCUTANEOUS

## 2017-06-17 MED ORDER — ALBUTEROL SULFATE (2.5 MG/3ML) 0.083% IN NEBU: 2.5000 mg | INHALATION_SOLUTION | RESPIRATORY_TRACT | Status: DC | PRN

## 2017-06-17 MED ORDER — DOXYCYCLINE HYCLATE 100 MG PO TABS
100.0000 mg | ORAL_TABLET | Freq: Two times a day (BID) | ORAL | Status: DC
  Administered 2017-06-17 – 2017-06-18 (×2): 100 mg via ORAL
  Filled 2017-06-17 (×2): qty 1

## 2017-06-17 MED ORDER — INSULIN GLARGINE 100 UNIT/ML ~~LOC~~ SOLN
5.0000 [IU] | Freq: Every day | SUBCUTANEOUS | Status: DC
  Administered 2017-06-17 – 2017-06-18 (×2): 5 [IU] via SUBCUTANEOUS
  Filled 2017-06-17 (×2): qty 0.05

## 2017-06-17 MED ORDER — INSULIN ASPART 100 UNIT/ML ~~LOC~~ SOLN
0.0000 [IU] | Freq: Three times a day (TID) | SUBCUTANEOUS | Status: DC
  Administered 2017-06-18: 8 [IU] via SUBCUTANEOUS
  Administered 2017-06-18 (×2): 3 [IU] via SUBCUTANEOUS

## 2017-06-17 NOTE — Progress Notes (Signed)
Per daughter, pt is altered in mentation and seems like she may have had a mini stroke. MD text paged and waiting for response

## 2017-06-17 NOTE — Progress Notes (Signed)
Inpatient Diabetes Program Recommendations  AACE/ADA: New Consensus Statement on Inpatient Glycemic Control (2015)  Target Ranges:  Prepandial:   less than 140 mg/dL      Peak postprandial:   less than 180 mg/dL (1-2 hours)      Critically ill patients:  140 - 180 mg/dL   Lab Results  Component Value Date   GLUCAP 253 (H) 06/17/2017   HGBA1C 6.3 (H) 01/01/2017    Review of Glycemic Control Results for Candice Miller, Candice Miller (MRN [id number]) as of 06/17/2017 14:17  Ref. Range 06/16/2017 17:02 06/16/2017 21:16 06/17/2017 07:40 06/17/2017 11:34  Glucose-Capillary Latest Ref Range: 65 - 99 mg/dL 247 (H) 306 (H)  253 (H)   Diabetes history: Type 2 DM Outpatient Diabetes medications: Amaryl 4 mg in AM Current orders for Inpatient glycemic control: Novolog 0-5 Units QHs, Novolog 0-9 Units [facility]  Inpatient Diabetes Program Recommendations:    Noted Prednisone 40 mg in PM, thus contributing to increased BS. Consider starting Levemir 5 untis QHS. May need adjustments if steroids are tapered.  Thanks, Bronson Curb, MSN, RNC-OB Diabetes Coordinator [phone] (8a-5p)

## 2017-06-17 NOTE — Progress Notes (Signed)
PROGRESS NOTE   Candice Miller  STM:[id number]    DOB: [birth year]    DOA: 06/15/2017  PCP: Lawerance Cruel, MD   I have briefly reviewed patients previous medical records in [facility]  Brief Narrative:  84 year old female, lives with her daughter and son-in-law, ambulates with the help of a walker, with PMH of HTN, DM, COPD, had been on steroids and antibiotics off and on since January for URTI, had Unna boots for cellulitis/phlebitis, presented with complaints of mechanical fall at home, high fevers, nonproductive cough and hypoxia up to 88% on room air.  Admitted for COPD exacerbation.  Slowly improving.   Assessment & Plan:   Principal Problem:   COPD with acute exacerbation ([place]) Active Problems:   Memory loss   DM (diabetes mellitus) (HCC)   HTN (hypertension)   CKD (chronic kidney disease) stage 3, GFR 30-59 ml/min (HCC)   Fall at home, initial encounter   COPD exacerbation: Likely precipitated by viral URTI.  Had been on intermittent steroids and antibiotics off and on since January as per report.  Initially started on IV Solu-Medrol, now transitioned to oral prednisone 40 mg daily.  Complete course of empiric doxycycline.  Continue bronchodilator nebulizations.  Added flutter valve.  RSV: Coronavirus +.  Reviewed repeat chest x-ray 3/6: Chronic bronchitic changes without pneumonia or CHF.  Improving.  Acute respiratory failure with hypoxia: Likely secondary to COPD exacerbation.  Not on home oxygen PTA.  Currently saturating at 100% on 2 L/min [place] oxygen.  Wean as tolerated.  Mechanical fall at home: SNF for rehab when medically improved.  Imaging negative for fractures.  No pain reported.  Type II DM: A1c 9/18: 6.3 suggesting adequate control.  Takes glimepiride on and off.  Currently uncontrolled due to steroids.  Add low-dose Lantus to NovoLog SSI.  Essential hypertension: Controlled.  Continue lisinopril & diltiazem.  Stage III chronic kidney disease: Creatinine  stable.  Monitor periodically.   Dementia with confusion: Confusion likely multifactorial related to acute illness, hospitalization, steroids complicating underlying dementia.  No focal deficits on exam.  Discussed same in detail with daughter who verbalized understanding and no clear indication for stroke workup at this time.  Microcytic anemia: Unclear etiology.  Stable.  Outpatient follow-up.   DVT prophylaxis: Lovenox Code Status: DNR Family Communication: Discussed in detail with patient's daughter at bedside. Disposition: DC potentially to SNF pending further clinical improvement, possibly 06/18/17.   Consultants:  None  Procedures:  None  Antimicrobials:  Doxycycline   Subjective: Patient reports some wheezing this morning.  Apparently refused nebulizing treatment because she was having her breakfast.  As per daughter, appeared more confused than usual this morning but has since improved.  No chest pain.  Mild intermittent dyspnea but better compared to admission.  ROS: As above.  Objective:  Vitals:   06/16/17 1952 06/16/17 2118 06/17/17 0444 06/17/17 1354  BP:  (!) 131/48 (!) 117/51 (!) 135/57  Pulse:  88 69 81  Resp:  20 20 20   Temp:  98.4 F (36.9 C) 98.3 F (36.8 C) (!) 97.5 F (36.4 C)  TempSrc:  Oral Oral Oral  SpO2: 97% 98% 98% 100%  Weight:      Height:        Examination:  General exam: Pleasant elderly female, moderately built and thinly nourished, lying comfortably propped up in bed. Respiratory system: Harsh and slightly diminished breath sounds bilaterally.  Few scattered expiratory rhonchi posteriorly.  No crackles. Respiratory effort normal. Cardiovascular system: S1 &  S2 heard, RRR. No JVD, murmurs, rubs, gallops or clicks. No pedal edema. Gastrointestinal system: Abdomen is nondistended, soft and nontender. No organomegaly or masses felt. Normal bowel sounds heard. Central nervous system: Alert and oriented x2. No focal neurological  deficits. Extremities: Symmetric 5 x 5 power. Skin: No rashes, lesions or ulcers Psychiatry: Judgement and insight appear normal. Mood & affect slightly anxious.     Data Reviewed: I have personally reviewed following labs and imaging studies  CBC: Recent Labs  Lab 06/15/17 0523 06/16/17 0603  WBC 5.1 3.5*  HGB 8.6* 8.2*  HCT 29.0* 26.8*  MCV 74.7* 74.9*  PLT 316 660   Basic Metabolic Panel: Recent Labs  Lab 06/15/17 0523 06/16/17 0603  NA 132* 131*  K 3.7 4.0  CL 93* 96*  CO2 29 26  GLUCOSE 137* 204*  BUN 15 22*  CREATININE 1.03* 0.98  CALCIUM 9.0 8.7*   CBG: Recent Labs  Lab 06/16/17 0743 06/16/17 1200 06/16/17 1702 06/16/17 2116 06/17/17 1134  GLUCAP 194* 250* 247* 306* 253*    Recent Results (from the past 240 hour(s))  Respiratory Panel by PCR     Status: Abnormal   Collection Time: 06/15/17 11:45 AM  Result Value Ref Range Status   Adenovirus NOT DETECTED NOT DETECTED Final   Coronavirus 229E NOT DETECTED NOT DETECTED Final   Coronavirus HKU1 NOT DETECTED NOT DETECTED Final   Coronavirus NL63 NOT DETECTED NOT DETECTED Final   Coronavirus OC43 DETECTED (A) NOT DETECTED Final   Metapneumovirus NOT DETECTED NOT DETECTED Final   Rhinovirus / Enterovirus NOT DETECTED NOT DETECTED Final   Influenza A NOT DETECTED NOT DETECTED Final   Influenza B NOT DETECTED NOT DETECTED Final   Parainfluenza Virus 1 NOT DETECTED NOT DETECTED Final   Parainfluenza Virus 2 NOT DETECTED NOT DETECTED Final   Parainfluenza Virus 3 NOT DETECTED NOT DETECTED Final   Parainfluenza Virus 4 NOT DETECTED NOT DETECTED Final   Respiratory Syncytial Virus NOT DETECTED NOT DETECTED Final   Bordetella pertussis NOT DETECTED NOT DETECTED Final   Chlamydophila pneumoniae NOT DETECTED NOT DETECTED Final   Mycoplasma pneumoniae NOT DETECTED NOT DETECTED Final         Radiology Studies: Dg Chest 2 View  Result Date: 06/17/2017 CLINICAL DATA:  History of breast malignancy in 1994.  History of COPD, hypertension, and diabetes. Nonsmoker. EXAM: CHEST - 2 VIEW COMPARISON:  Chest x-ray of December 30, 2016 and chest CT scan of February 27, 2017. FINDINGS: The lungs are borderline hypoinflated. The lung markings are coarse in the right perihilar region. These are stable. There is stable increased density at the left lung base laterally. The heart and pulmonary vascularity are normal. The interstitial markings of both lungs are mildly prominent. There is chronic approximately 50% compression of approximately T8. IMPRESSION: Mild chronic bronchitic changes.  No pneumonia nor CHF. Electronically Signed   By: David  Martinique M.D.   On: 06/17/2017 09:01        Scheduled Meds: . atorvastatin  10 mg Oral q1800  . diltiazem  180 mg Oral Daily  . docusate sodium  100 mg Oral BID  . enoxaparin (LOVENOX) injection  40 mg Subcutaneous Q24H  . famotidine  20 mg Oral BID  . fluticasone  2 spray Each Nare Daily  . furosemide  40 mg Oral Daily  . gabapentin  300 mg Oral QHS  . insulin aspart  0-5 Units Subcutaneous QHS  . insulin aspart  0-9 Units Subcutaneous TID WC  .  ipratropium-albuterol  3 mL Nebulization TID  . iron polysaccharides  150 mg Oral Daily  . lisinopril  10 mg Oral Daily  . loratadine  10 mg Oral Daily  . Magnesium  200 mg Oral QHS  . memantine  10 mg Oral BID  . polyethylene glycol  17 g Oral Daily  . predniSONE  40 mg Oral Q supper  . sucralfate  1 g Oral BID   Continuous Infusions: . doxycycline (VIBRAMYCIN) IV Stopped (06/17/17 1201)     LOS: 2 days     Vernell Leep, MD, FACP, [facility]. [facility] Pager [phone]  If 7PM-7AM, please contact night-coverage [email or web address] Password [email or web address] 06/17/2017, 2:09 PM

## 2017-06-17 NOTE — Social Work (Addendum)
Spoke with pt and pt daughter, pt a little more confused this morning. Pt daughter would like SNF as she feels like pt does not participate well with PT/OT in the home. Pt daughter is going to tour facilities and will make a decision, aware that pt may be discharged tomorrow.  1:11pm- Pt daughter has indicated that [facility] is preference, [facility] able to take pt.   CSW continuing to follow.   [place], [place] Work [phone]

## 2017-06-18 DIAGNOSIS — Z9181 History of falling: Secondary | ICD-10-CM | POA: Diagnosis not present

## 2017-06-18 DIAGNOSIS — J189 Pneumonia, unspecified organism: Secondary | ICD-10-CM | POA: Diagnosis not present

## 2017-06-18 DIAGNOSIS — R488 Other symbolic dysfunctions: Secondary | ICD-10-CM | POA: Diagnosis not present

## 2017-06-18 DIAGNOSIS — E118 Type 2 diabetes mellitus with unspecified complications: Secondary | ICD-10-CM | POA: Diagnosis not present

## 2017-06-18 DIAGNOSIS — J441 Chronic obstructive pulmonary disease with (acute) exacerbation: Secondary | ICD-10-CM | POA: Diagnosis not present

## 2017-06-18 DIAGNOSIS — G301 Alzheimer's disease with late onset: Secondary | ICD-10-CM | POA: Diagnosis not present

## 2017-06-18 DIAGNOSIS — J9601 Acute respiratory failure with hypoxia: Secondary | ICD-10-CM | POA: Diagnosis not present

## 2017-06-18 DIAGNOSIS — J449 Chronic obstructive pulmonary disease, unspecified: Secondary | ICD-10-CM | POA: Diagnosis not present

## 2017-06-18 DIAGNOSIS — I10 Essential (primary) hypertension: Secondary | ICD-10-CM | POA: Diagnosis not present

## 2017-06-18 DIAGNOSIS — E119 Type 2 diabetes mellitus without complications: Secondary | ICD-10-CM | POA: Diagnosis not present

## 2017-06-18 DIAGNOSIS — I129 Hypertensive chronic kidney disease with stage 1 through stage 4 chronic kidney disease, or unspecified chronic kidney disease: Secondary | ICD-10-CM | POA: Diagnosis not present

## 2017-06-18 DIAGNOSIS — M6281 Muscle weakness (generalized): Secondary | ICD-10-CM | POA: Diagnosis not present

## 2017-06-18 DIAGNOSIS — E785 Hyperlipidemia, unspecified: Secondary | ICD-10-CM | POA: Diagnosis not present

## 2017-06-18 DIAGNOSIS — R410 Disorientation, unspecified: Secondary | ICD-10-CM | POA: Diagnosis not present

## 2017-06-18 DIAGNOSIS — G912 (Idiopathic) normal pressure hydrocephalus: Secondary | ICD-10-CM | POA: Diagnosis not present

## 2017-06-18 DIAGNOSIS — E1169 Type 2 diabetes mellitus with other specified complication: Secondary | ICD-10-CM | POA: Diagnosis not present

## 2017-06-18 DIAGNOSIS — W19XXXD Unspecified fall, subsequent encounter: Secondary | ICD-10-CM | POA: Diagnosis not present

## 2017-06-18 DIAGNOSIS — R2681 Unsteadiness on feet: Secondary | ICD-10-CM | POA: Diagnosis not present

## 2017-06-18 DIAGNOSIS — F0280 Dementia in other diseases classified elsewhere without behavioral disturbance: Secondary | ICD-10-CM | POA: Diagnosis not present

## 2017-06-18 DIAGNOSIS — K219 Gastro-esophageal reflux disease without esophagitis: Secondary | ICD-10-CM | POA: Diagnosis not present

## 2017-06-18 DIAGNOSIS — B999 Unspecified infectious disease: Secondary | ICD-10-CM | POA: Diagnosis not present

## 2017-06-18 DIAGNOSIS — N183 Chronic kidney disease, stage 3 (moderate): Secondary | ICD-10-CM | POA: Diagnosis not present

## 2017-06-18 DIAGNOSIS — R911 Solitary pulmonary nodule: Secondary | ICD-10-CM | POA: Diagnosis not present

## 2017-06-18 DIAGNOSIS — E1040 Type 1 diabetes mellitus with diabetic neuropathy, unspecified: Secondary | ICD-10-CM | POA: Diagnosis not present

## 2017-06-18 LAB — CBC
HCT: 27.7 % — ABNORMAL LOW (ref 36.0–46.0)
Hemoglobin: 8.4 g/dL — ABNORMAL LOW (ref 12.0–15.0)
MCH: 22.8 pg — ABNORMAL LOW (ref 26.0–34.0)
MCHC: 30.3 g/dL (ref 30.0–36.0)
MCV: 75.3 fL — AB (ref 78.0–100.0)
PLATELETS: 339 10*3/uL (ref 150–400)
RBC: 3.68 MIL/uL — AB (ref 3.87–5.11)
RDW: 15.5 % (ref 11.5–15.5)
WBC: 8.3 10*3/uL (ref 4.0–10.5)

## 2017-06-18 LAB — GLUCOSE, CAPILLARY
GLUCOSE-CAPILLARY: 260 mg/dL — AB (ref 65–99)
Glucose-Capillary: 169 mg/dL — ABNORMAL HIGH (ref 65–99)
Glucose-Capillary: 195 mg/dL — ABNORMAL HIGH (ref 65–99)

## 2017-06-18 MED ORDER — PREDNISONE 10 MG PO TABS: ORAL_TABLET | ORAL | Status: DC

## 2017-06-18 MED ORDER — PREDNISONE 20 MG PO TABS
30.0000 mg | ORAL_TABLET | Freq: Once | ORAL | Status: AC
  Administered 2017-06-18: 30 mg via ORAL
  Filled 2017-06-18: qty 1

## 2017-06-18 MED ORDER — DOXYCYCLINE HYCLATE 100 MG PO TABS: 100.0000 mg | ORAL_TABLET | Freq: Two times a day (BID) | ORAL | Status: DC

## 2017-06-18 NOTE — Clinical Social Work Placement (Signed)
   CLINICAL SOCIAL WORK PLACEMENT  NOTE 06/18/17 - DISCHARGED TO [facility]  Date:  06/18/2017  Patient Details  Name: [name] MRN: [id number] Date of Birth: 1924-07-03  Clinical Social Work is seeking post-discharge placement for this patient at the [place] level of care (*CSW will initial, date and re-position this form in  chart as items are completed):  Yes   Patient/family provided with [facility] Work Department's list of facilities offering this level of care within the geographic area requested by the patient (or if unable, by the patient's family).  Yes   Patient/family informed of their freedom to choose among providers that offer the needed level of care, that participate in Medicare, Medicaid or managed care program needed by the patient, have an available bed and are willing to accept the patient.  Yes   Patient/family informed of [facility]'s ownership interest in [facility] and [facility], as well as of the fact that they are under no obligation to receive care at these facilities.  PASRR submitted to EDS on       PASRR number received on 06/15/17     Existing PASRR number confirmed on       FL2 transmitted to all facilities in geographic area requested by pt/family on 06/15/17     FL2 transmitted to all facilities within larger geographic area on       Patient informed that his/her managed care company has contracts with or will negotiate with certain facilities, including the following:        Yes   Patient/family informed of bed offers received.  Patient chooses bed at [facility]     Physician recommends and patient chooses bed at      Patient to be transferred to [facility] on  06/18/17.  Patient to be transferred to facility by [facility]     Patient family notified on  06/18/17 of transfer.  Name of family member notified:   Daughter [name] by phone  [phone]).     PHYSICIAN Please prepare priority discharge summary, including medications     Additional Comment:    _______________________________________________ Sable Feil, LCSW 06/18/2017, 6:32 PM

## 2017-06-18 NOTE — Discharge Summary (Signed)
Physician Discharge Summary  Candice Miller XBJ:[id number] DOB: 01/06/1925  PCP: Lawerance Cruel, MD  Admit date: 06/15/2017 Discharge date: 06/18/2017  Recommendations for Outpatient Follow-up:  1. MD at SNF in 4 days with repeat labs (CBC & BMP). 2. Dr. Lawerance Cruel, PCP upon discharge from SNF. 3. Monitor CBGs 3 times daily before meals and at bedtime at SNF and consider adding SSI if needed.  Home Health: N/A Equipment/Devices: N/A    Discharge Condition: Improved and stable. CODE STATUS: DNR. Diet recommendation: Heart healthy & diabetic diet.  Discharge Diagnoses:  Principal Problem:   COPD with acute exacerbation ([place]) Active Problems:   Memory loss   DM (diabetes mellitus) (HCC)   HTN (hypertension)   CKD (chronic kidney disease) stage 3, GFR 30-59 ml/min (HCC)   Fall at home, initial encounter   Brief Summary: 84 year old female, lives with her daughter and son-in-law, ambulates with the help of a walker, with PMH of HTN, DM, COPD, had been on steroids and antibiotics off and on since January for URTI, had Unna boots for cellulitis/phlebitis, presented with complaints of mechanical fall at home, high fevers, nonproductive cough and hypoxia up to 88% on room air.  Admitted for COPD exacerbation.     Assessment & Plan:   COPD exacerbation: Likely precipitated by viral URTI.  Had been on intermittent steroids and antibiotics off and on since January as per report.  Initially started on IV Solu-Medrol, then transitioned to oral prednisone 40 mg daily.  Complete course of empiric doxycycline.  Added flutter valve.  RSV: Coronavirus +.  Reviewed repeat chest x-ray 3/6: Chronic bronchitic changes without pneumonia or CHF.  Clinically improved.  Rapid steroid taper at discharge, complete 5 days of oral doxycycline, encourage flutter valve use, wean oxygen as tolerated for oxygen saturations between 88-92%.  Continue PTA bronchodilator inhaler/nebulizations and  Symbicort.  Acute respiratory failure with hypoxia: Likely secondary to COPD exacerbation.  Not on home oxygen PTA.  Currently saturating at 98% on 2 L/min [place] oxygen.  Wean as tolerated.  Mechanical fall at home: SNF for rehab when medically improved.  Imaging negative for fractures.  No pain reported.  Type II DM: A1c 9/18: 6.3 suggesting adequate control.  Takes glimepiride on and off.  Currently uncontrolled due to steroids.  In the hospital was treated with low-dose Lantus and SSI.  Resume glimepiride at discharge and encourage compliance.  Monitor CBGs closely and consider adding SSI if needed.  CBG should improve with rapid taper of prednisone.  Essential hypertension: Controlled.  Continue lisinopril & diltiazem.  Stage III chronic kidney disease: Creatinine stable.  Monitor periodically.   Dementia with confusion: Confusion likely multifactorial related to acute illness, hospitalization, steroids complicating underlying dementia.  No focal deficits on exam.  Discussed same in detail with daughter on 3/6 who verbalized understanding and no clear indication for stroke workup at this time.  Microcytic anemia: Unclear etiology.  Stable.    She had hemoglobin in the 10.8-11 range in September 2018.  No bleeding reported.  Unclear reasons as to drop in hemoglobin since then.  Not symptomatic.  Close outpatient follow-up and consider evaluation as deemed necessary.  Right mid lobe nodular densities: Reported on CT chest without contrast 06/10/17.  As per radiologist recommendation, repeat CT of the chest without contrast in 6 months to determine ongoing stability.   Consultants:  None  Procedures:  None    Discharge Instructions  Discharge Instructions    (HEART FAILURE PATIENTS) Call MD:  Anytime you have any of the following symptoms: 1) 3 pound weight gain in 24 hours or 5 pounds in 1 week 2) shortness of breath, with or without a dry hacking cough 3) swelling in the hands,  feet or stomach 4) if you have to sleep on extra pillows at night in order to breathe.   Complete by:  As directed    Call MD for:  difficulty breathing, headache or visual disturbances   Complete by:  As directed    Call MD for:  extreme fatigue   Complete by:  As directed    Call MD for:  persistant dizziness or light-headedness   Complete by:  As directed    Call MD for:  temperature >100.4   Complete by:  As directed    Diet - low sodium heart healthy   Complete by:  As directed    Diet Carb Modified   Complete by:  As directed    Increase activity slowly   Complete by:  As directed        Medication List    TAKE these medications   albuterol 108 (90 Base) MCG/ACT inhaler Commonly known as:  PROVENTIL HFA;VENTOLIN HFA Inhale 2 puffs into the lungs every 6 (six) hours as needed for wheezing.   ammonium lactate 12 % lotion Commonly known as:  LAC-HYDRIN Apply 1 application topically 2 (two) times daily.   atorvastatin 10 MG tablet Commonly known as:  LIPITOR Take 10 mg by mouth daily.   budesonide-formoterol 80-4.5 MCG/ACT inhaler Commonly known as:  SYMBICORT Inhale 2 puffs into the lungs 2 (two) times daily.   Calcium 150 MG Tabs Take 150 mg by mouth daily.   cholecalciferol 1000 units tablet Commonly known as:  VITAMIN D Take 1,000 Units by mouth daily.   diltiazem 180 MG 24 hr capsule Commonly known as:  CARDIZEM CD Take 180 mg by mouth daily.   doxycycline 100 MG tablet Commonly known as:  VIBRA-TABS Take 1 tablet (100 mg total) by mouth 2 (two) times daily. Discontinue after 06/19/17 doses.   fexofenadine 180 MG tablet Commonly known as:  ALLEGRA Take 180 mg by mouth daily.   Fish Oil 1000 MG Caps Take 1,000 mg by mouth daily.   fluticasone 50 MCG/ACT nasal spray Commonly known as:  FLONASE Place 2 sprays into both nostrils daily.   furosemide 40 MG tablet Commonly known as:  LASIX Take 40 mg by mouth daily.   gabapentin 300 MG  capsule Commonly known as:  NEURONTIN Take 300 mg by mouth at bedtime.   glimepiride 1 MG tablet Commonly known as:  AMARYL Take 1 mg by mouth daily with breakfast.   ipratropium-albuterol 0.5-2.5 (3) MG/3ML Soln Commonly known as:  DUONEB Take 3 mLs by nebulization 4 (four) times daily.   iron polysaccharides 150 MG capsule Commonly known as:  NIFEREX Take 150 mg by mouth daily.   l-methylfolate-B6-B12 3-35-2 MG Tabs tablet Commonly known as:  METANX Take 1 tablet by mouth 2 (two) times daily.   lisinopril 10 MG tablet Commonly known as:  PRINIVIL,ZESTRIL Take 10 mg by mouth daily.   Magnesium 250 MG Tabs Take 250 mg by mouth at bedtime.   memantine 10 MG tablet Commonly known as:  NAMENDA Take 10 mg by mouth 2 (two) times daily.   nitroGLYCERIN 0.4 MG SL tablet Commonly known as:  NITROSTAT Place 0.4 mg under the tongue every 5 (five) minutes as needed for chest pain.   polyethylene glycol  packet Commonly known as:  MIRALAX / GLYCOLAX Take 17 g by mouth daily.   predniSONE 10 MG tablet Commonly known as:  DELTASONE Take 3 tabs daily for 2 days, then 2 tabs daily for 2 days, then 1 tab daily for 2 days, then stop. Start taking on:  06/19/2017   ranitidine 150 MG tablet Commonly known as:  ZANTAC Take 150 mg by mouth 2 (two) times daily.   sucralfate 1 g tablet Commonly known as:  CARAFATE Take 1 g by mouth 2 (two) times daily.       Contact information for follow-up providers    MD at SNF. Schedule an appointment as soon as possible for a visit in 4 day(s).   Why:  To be seen with repeat labs (CBC & BMP).       Lawerance Cruel, MD. Schedule an appointment as soon as possible for a visit.   Specialty:  Family Medicine Why:  Upon discharge from SNF. Contact information: [place] [address] [phone]            Contact information for after-discharge care    Destination    [facility] Follow up.    Service:  Skilled Nursing Contact information: [place] [phone]                 Allergies  Allergen Reactions  . Keflex [Cephalexin]   . Other     Pt reports "microdentin"  . Sulfa Antibiotics       Procedures/Studies: Dg Chest 2 View  Result Date: 06/17/2017 CLINICAL DATA:  History of breast malignancy in 1994. History of COPD, hypertension, and diabetes. Nonsmoker. EXAM: CHEST - 2 VIEW COMPARISON:  Chest x-ray of December 30, 2016 and chest CT scan of February 27, 2017. FINDINGS: The lungs are borderline hypoinflated. The lung markings are coarse in the right perihilar region. These are stable. There is stable increased density at the left lung base laterally. The heart and pulmonary vascularity are normal. The interstitial markings of both lungs are mildly prominent. There is chronic approximately 50% compression of approximately T8. IMPRESSION: Mild chronic bronchitic changes.  No pneumonia nor CHF. Electronically Signed   By: David  Martinique M.D.   On: 06/17/2017 09:01   Dg Ribs Unilateral W/chest Right  Result Date: 06/15/2017 CLINICAL DATA:  Right rib pain after fall going to the bathroom tonight. EXAM: RIGHT RIBS AND CHEST - 3+ VIEW COMPARISON:  Chest CT 06/10/2017 FINDINGS: No fracture or other bone lesions are seen involving the ribs. There is no evidence of pneumothorax or pleural effusion. Chronic lung disease with interstitial coarsening. Heart size and mediastinal contours are within normal limits. There is aortic atherosclerosis. IMPRESSION: 1. No right rib fracture or pulmonary complication. 2. Chronic lung disease appears similar to prior CT. Electronically Signed   By: Jeb Levering M.D.   On: 06/15/2017 05:30   Ct Chest Wo Contrast  Result Date: 06/10/2017 CLINICAL DATA:  Follow-up of right middle lobe pulmonary nodule. EXAM: CT CHEST WITHOUT CONTRAST TECHNIQUE: Multidetector CT imaging of the chest was performed  following the standard protocol without IV contrast. COMPARISON:  CT scan dated 02/27/2017 and chest x-ray dated 12/30/2016 FINDINGS: Cardiovascular: Heart size is normal.  Aortic atherosclerosis. Mediastinum/Nodes: There is a slightly prominent precarinal lymph node measuring 19 mm, increased from 17 mm on the prior study. No discrete hilar adenopathy. Thyroid gland is normal. Trachea appears normal. Small hiatal  hernia. Lungs/Pleura: There has been no significant change in the appearance of the lungs since the prior study. The patient has severe chronic interstitial and obstructive lung disease. Irregular linear nodular densities in the right middle lobe next dilated bronchi are essentially unchanged and most likely represent chronic inflammatory disease. No effusions. Upper Abdomen: No acute abnormality. Diverticulosis of the splenic flexure of the colon. Musculoskeletal: No acute abnormality. Old compression fracture in the midthoracic spine. IMPRESSION: 1. No significant change in the appearance of the chest since the prior study. Nodular densities in the right middle lobe are stable. I recommend follow-up CT scan of the chest without contrast in 6 months to determine ongoing stability. 2. Severe chronic interstitial and obstructive lung disease. 3. Aortic Atherosclerosis (ICD10-I70.0) and Emphysema (ICD10-J43.9). Electronically Signed   By: Lorriane Shire M.D.   On: 06/10/2017 17:35   Ct Lumbar Spine Wo Contrast  Result Date: 06/15/2017 CLINICAL DATA:  Fall last night, hip and back pain EXAM: CT LUMBAR SPINE WITHOUT CONTRAST TECHNIQUE: Multidetector CT imaging of the lumbar spine was performed without intravenous contrast administration. Multiplanar CT image reconstructions were also generated. COMPARISON:  Plain film of the lumbar spine dated 06/11/2016. FINDINGS: Segmentation: 5 lumbar type vertebrae. Alignment: Mild levoscoliosis centered at the L2-3 level. No evidence of acute vertebral body  subluxation. Vertebrae: Chronic wedge compression deformity of the L1 vertebral body, stable compared to the previous plain film of 06/11/2016. No evidence of acute fracture. No fracture line or displaced fracture fragment. Facet joints appear intact and normally aligned throughout. Small chronic appearing deformity of the left L2 transverse process. Paraspinal and other soft tissues: Aortic atherosclerosis. Visualized paravertebral soft tissues are otherwise unremarkable. Disc levels: Disc desiccations at the T12-L1 and L1-2 L2 levels, mild to moderate in degree with associated disc space narrowing, vacuum disc phenomenon and mild osseous spurring. Mild disc bulge at L4-5, combined with degenerative hypertrophy of the ligamentum flavum, causing mild to moderate central canal stenosis with possible associated lateral recess narrowings. No more than mild central canal stenosis at any other level of the lumbar spine. IMPRESSION: 1. No acute findings.  No acute fracture or dislocation. 2. Chronic wedge compression deformity of the L1 vertebral body, stable compared to previous plain film of 06/11/2016. No associated vertebral body retropulsion. 3. Degenerative changes within the upper and lower lumbar spine, with associated disc bulge at L4-5 causing mild to moderate central canal stenosis. Electronically Signed   By: Franki Cabot M.D.   On: 06/15/2017 07:43   Ct Pelvis Wo Contrast  Result Date: 06/15/2017 CLINICAL DATA:  Fall last night, hip and back pain. EXAM: CT PELVIS WITHOUT CONTRAST TECHNIQUE: Multidetector CT imaging of the pelvis was performed following the standard protocol without intravenous contrast. COMPARISON:  None. FINDINGS: Urinary Tract:  Bladder and distal ureters are unremarkable. Bowel: Extensive diverticulosis of the sigmoid colon and lower descending colon but no focal inflammatory change to suggest acute diverticulitis. No dilated large or small bowel loops within the pelvis or lower  abdomen. Vascular/Lymphatic: No acute findings.  Atherosclerosis. Reproductive:  No mass or other significant abnormality Other: No free fluid or hemorrhage within the pelvis. No free intraperitoneal air. Musculoskeletal: No osseous fracture or dislocation. Superficial soft tissues about the pelvis and right hip are unremarkable. IMPRESSION: 1. No acute findings. No osseous fracture or dislocation within the pelvis or at the right hip. 2. Colonic diverticulosis without evidence of acute diverticulitis. 3. Atherosclerosis. Electronically Signed   By: Roxy Horseman.D.  On: 06/15/2017 07:35   Dg Hip Unilat With Pelvis 2-3 Views Right  Result Date: 06/15/2017 CLINICAL DATA:  Right hip pain after fall going to the bathroom tonight. EXAM: DG HIP (WITH OR WITHOUT PELVIS) 2-3V RIGHT COMPARISON:  None. FINDINGS: The cortical margins of the bony pelvis and right hip are intact. No fracture. Pubic symphysis and sacroiliac joints are congruent. Both femoral heads are well-seated in the respective acetabula. Mild for age degenerative change of both hips. IMPRESSION: Negative for fracture of the pelvis or right hip. Electronically Signed   By: Jeb Levering M.D.   On: 06/15/2017 05:28      Subjective: Patient states that she feels "fine".  Denies dyspnea, cough, chest pain, wheezing, dizziness or lightheadedness.  As per RN, no acute issues reported.  Discharge Exam:  Vitals:   06/17/17 2059 06/17/17 2154 06/18/17 0522 06/18/17 0823  BP:  (!) 143/61 (!) 103/47   Pulse:  79 67 80  Resp:  20 20 18   Temp:  98.6 F (37 C) 98 F (36.7 C)   TempSrc:  Oral Oral   SpO2: 99% 97% 98%   Weight:      Height:        General exam: Pleasant elderly female, moderately built and thinly nourished, lying comfortably propped up in bed. Respiratory system:  Occasional basal crackles but otherwise clear to auscultation without wheezing or rhonchi.  No increased work of breathing. Cardiovascular system: S1 & S2 heard,  RRR. No JVD, murmurs, rubs, gallops or clicks. No pedal edema. Gastrointestinal system: Abdomen is nondistended, soft and nontender. No organomegaly or masses felt. Normal bowel sounds heard. Central nervous system: Alert and oriented x2. No focal neurological deficits. Extremities: Symmetric 5 x 5 power. Skin: No rashes, lesions or ulcers Psychiatry: Judgement and insight appear impaired. Mood & affect pleasant and appropriate.       The results of significant diagnostics from this hospitalization (including imaging, microbiology, ancillary and laboratory) are listed below for reference.     Microbiology: Recent Results (from the past 240 hour(s))  Respiratory Panel by PCR     Status: Abnormal   Collection Time: 06/15/17 11:45 AM  Result Value Ref Range Status   Adenovirus NOT DETECTED NOT DETECTED Final   Coronavirus 229E NOT DETECTED NOT DETECTED Final   Coronavirus HKU1 NOT DETECTED NOT DETECTED Final   Coronavirus NL63 NOT DETECTED NOT DETECTED Final   Coronavirus OC43 DETECTED (A) NOT DETECTED Final   Metapneumovirus NOT DETECTED NOT DETECTED Final   Rhinovirus / Enterovirus NOT DETECTED NOT DETECTED Final   Influenza A NOT DETECTED NOT DETECTED Final   Influenza B NOT DETECTED NOT DETECTED Final   Parainfluenza Virus 1 NOT DETECTED NOT DETECTED Final   Parainfluenza Virus 2 NOT DETECTED NOT DETECTED Final   Parainfluenza Virus 3 NOT DETECTED NOT DETECTED Final   Parainfluenza Virus 4 NOT DETECTED NOT DETECTED Final   Respiratory Syncytial Virus NOT DETECTED NOT DETECTED Final   Bordetella pertussis NOT DETECTED NOT DETECTED Final   Chlamydophila pneumoniae NOT DETECTED NOT DETECTED Final   Mycoplasma pneumoniae NOT DETECTED NOT DETECTED Final     Labs: CBC: Recent Labs  Lab 06/15/17 0523 06/16/17 0603 06/18/17 0421  WBC 5.1 3.5* 8.3  HGB 8.6* 8.2* 8.4*  HCT 29.0* 26.8* 27.7*  MCV 74.7* 74.9* 75.3*  PLT 316 331 562   Basic Metabolic Panel: Recent Labs  Lab  06/15/17 0523 06/16/17 0603  NA 132* 131*  K 3.7 4.0  CL 93*  96*  CO2 29 26  GLUCOSE 137* 204*  BUN 15 22*  CREATININE 1.03* 0.98  CALCIUM 9.0 8.7*   CBG: Recent Labs  Lab 06/16/17 2116 06/17/17 1134 06/17/17 1716 06/17/17 2155 06/18/17 0817  GLUCAP 306* 253* 95 239* 169*   I have discussed in detail with patient's daughter on 06/17/17 including ongoing care and potential discharge to SNF today.  No family at bedside today.    Time coordinating discharge: Over 30 minutes  SIGNED:  Vernell Leep, MD, FACP, [facility]. [facility] Pager [phone]  If 7PM-7AM, please contact night-coverage [email or web address] Password [facility] 06/18/2017, 12:13 PM

## 2017-06-18 NOTE — Progress Notes (Signed)
Report called to [place] at [facility].

## 2017-06-18 NOTE — Plan of Care (Signed)
  Progressing Education: Knowledge of General Education information will improve 06/18/2017 1009 - Progressing by Harlin Heys, RN Health Behavior/Discharge Planning: Ability to manage health-related needs will improve 06/18/2017 1009 - Progressing by Harlin Heys, RN Clinical Measurements: Ability to maintain clinical measurements within normal limits will improve 06/18/2017 1009 - Progressing by Harlin Heys, RN Will remain free from infection 06/18/2017 1009 - Progressing by Harlin Heys, RN Diagnostic test results will improve 06/18/2017 1009 - Progressing by Harlin Heys, RN Respiratory complications will improve 06/18/2017 1009 - Progressing by Harlin Heys, RN Cardiovascular complication will be avoided 06/18/2017 1009 - Progressing by Harlin Heys, RN Activity: Risk for activity intolerance will decrease 06/18/2017 1009 - Progressing by Harlin Heys, RN Nutrition: Adequate nutrition will be maintained 06/18/2017 1009 - Progressing by Harlin Heys, RN Coping: Level of anxiety will decrease 06/18/2017 1009 - Progressing by Harlin Heys, RN Elimination: Will not experience complications related to bowel motility 06/18/2017 1009 - Progressing by Harlin Heys, RN Will not experience complications related to urinary retention 06/18/2017 1009 - Progressing by Harlin Heys, RN Pain Managment: General experience of comfort will improve 06/18/2017 1009 - Progressing by Harlin Heys, RN Safety: Ability to remain free from injury will improve 06/18/2017 1009 - Progressing by Harlin Heys, RN Skin Integrity: Risk for impaired skin integrity will decrease 06/18/2017 1009 - Progressing by Harlin Heys, RN

## 2017-06-18 NOTE — Discharge Instructions (Signed)
Please get your medications reviewed and adjusted by your Primary MD. ° °Please request your Primary MD to go over all Hospital Tests and Procedure/Radiological results at the follow up, please get all Hospital records sent to your Prim MD by signing hospital release before you go home. ° °If you had Pneumonia of Lung problems at the Hospital: °Please get a 2 view Chest X ray done in 6-8 weeks after hospital discharge or sooner if instructed by your Primary MD. ° °If you have Congestive Heart Failure: °Please call your Cardiologist or Primary MD anytime you have any of the following symptoms:  °1) 3 pound weight gain in 24 hours or 5 pounds in 1 week  °2) shortness of breath, with or without a dry hacking cough  °3) swelling in the hands, feet or stomach  °4) if you have to sleep on extra pillows at night in order to breathe ° °Follow cardiac low salt diet and 1.5 lit/day fluid restriction. ° °If you have diabetes °Accuchecks 4 times/day, Once in AM empty stomach and then before each meal. °Log in all results and show them to your primary doctor at your next visit. °If any glucose reading is under 80 or above 300 call your primary MD immediately. ° °If you have Seizure/Convulsions/Epilepsy: °Please do not drive, operate heavy machinery, participate in activities at heights or participate in high speed sports until you have seen by Primary MD or a Neurologist and advised to do so again. ° °If you had Gastrointestinal Bleeding: °Please ask your Primary MD to check a complete blood count within one week of discharge or at your next visit. Your endoscopic/colonoscopic biopsies that are pending at the time of discharge, will also need to followed by your Primary MD. ° °Get Medicines reviewed and adjusted. °Please take all your medications with you for your next visit with your Primary MD ° °Please request your Primary MD to go over all hospital tests and procedure/radiological results at the follow up, please ask your  Primary MD to get all Hospital records sent to his/her office. ° °If you experience worsening of your admission symptoms, develop shortness of breath, life threatening emergency, suicidal or homicidal thoughts you must seek medical attention immediately by calling 911 or calling your MD immediately  if symptoms less severe. ° °You must read complete instructions/literature along with all the possible adverse reactions/side effects for all the Medicines you take and that have been prescribed to you. Take any new Medicines after you have completely understood and accpet all the possible adverse reactions/side effects.  ° °Do not drive or operate heavy machinery when taking Pain medications.  ° °Do not take more than prescribed Pain, Sleep and Anxiety Medications ° °Special Instructions: If you have smoked or chewed Tobacco  in the last 2 yrs please stop smoking, stop any regular Alcohol  and or any Recreational drug use. ° °Wear Seat belts while driving. ° °Please note °You were cared for by a hospitalist during your hospital stay. If you have any questions about your discharge medications or the care you received while you were in the hospital after you are discharged, you can call the unit and asked to speak with the hospitalist on call if the hospitalist that took care of you is not available. Once you are discharged, your primary care physician will handle any further medical issues. Please note that NO REFILLS for any discharge medications will be authorized once you are discharged, as it is imperative that you   return to your primary care physician (or establish a relationship with a primary care physician if you do not have one) for your aftercare needs so that they can reassess your need for medications and monitor your lab values. ° °You can reach the hospitalist office at phone [phone] or fax [phone] °  °If you do not have a primary care physician, you can call [phone] for a physician  referral. ° ° °Chronic Obstructive Pulmonary Disease Exacerbation °Chronic obstructive pulmonary disease (COPD) is a common lung problem. In COPD, the flow of air from the lungs is limited. COPD exacerbations are times that breathing gets worse and you need extra treatment. Without treatment they can be life threatening. If they happen often, your lungs can become more damaged. If your COPD gets worse, your doctor may treat you with: °· Medicines. °· Oxygen. °· Different ways to clear your airway, such as using a mask. ° °Follow these instructions at home: °· Do not smoke. °· Avoid tobacco smoke and other things that bother your lungs. °· If given, take your antibiotic medicine as told. Finish the medicine even if you start to feel better. °· Only take medicines as told by your doctor. °· Drink enough fluids to keep your pee (urine) clear or pale yellow (unless your doctor has told you not to). °· Use a cool mist machine (vaporizer). °· If you use oxygen or a machine that turns liquid medicine into a mist (nebulizer), continue to use them as told. °· Keep up with shots (vaccinations) as told by your doctor. °· Exercise regularly. °· Eat healthy foods. °· Keep all doctor visits as told. °Get help right away if: °· You are very short of breath and it gets worse. °· You have trouble talking. °· You have bad chest pain. °· You have blood in your spit (sputum). °· You have a fever. °· You keep throwing up (vomiting). °· You feel weak, or you pass out (faint). °· You feel confused. °· You keep getting worse. °This information is not intended to replace advice given to you by your health care provider. Make sure you discuss any questions you have with your health care provider. °Document Released: 03/20/2011 Document Revised: 09/06/2015 Document Reviewed: 12/03/2012 °Elsevier Interactive Patient Education © 2017 Elsevier Inc. ° °

## 2017-06-18 NOTE — Progress Notes (Signed)
Pt discharged to [facility] with transporters. All paperwork given to transporters. All belongings sent with patient, including dentures. VSS.

## 2017-06-19 ENCOUNTER — Encounter: Payer: Self-pay | Admitting: Internal Medicine

## 2017-06-19 ENCOUNTER — Non-Acute Institutional Stay (SKILLED_NURSING_FACILITY): Payer: Medicare Other | Admitting: Internal Medicine

## 2017-06-19 DIAGNOSIS — I10 Essential (primary) hypertension: Secondary | ICD-10-CM | POA: Diagnosis not present

## 2017-06-19 DIAGNOSIS — G912 (Idiopathic) normal pressure hydrocephalus: Secondary | ICD-10-CM | POA: Diagnosis not present

## 2017-06-19 DIAGNOSIS — W19XXXD Unspecified fall, subsequent encounter: Secondary | ICD-10-CM

## 2017-06-19 DIAGNOSIS — J9601 Acute respiratory failure with hypoxia: Secondary | ICD-10-CM

## 2017-06-19 DIAGNOSIS — J441 Chronic obstructive pulmonary disease with (acute) exacerbation: Secondary | ICD-10-CM | POA: Diagnosis not present

## 2017-06-19 DIAGNOSIS — E1040 Type 1 diabetes mellitus with diabetic neuropathy, unspecified: Secondary | ICD-10-CM

## 2017-06-19 DIAGNOSIS — R911 Solitary pulmonary nodule: Secondary | ICD-10-CM

## 2017-06-19 DIAGNOSIS — K219 Gastro-esophageal reflux disease without esophagitis: Secondary | ICD-10-CM | POA: Diagnosis not present

## 2017-06-19 DIAGNOSIS — R410 Disorientation, unspecified: Secondary | ICD-10-CM | POA: Diagnosis not present

## 2017-06-19 DIAGNOSIS — E118 Type 2 diabetes mellitus with unspecified complications: Secondary | ICD-10-CM

## 2017-06-19 DIAGNOSIS — E1169 Type 2 diabetes mellitus with other specified complication: Secondary | ICD-10-CM | POA: Diagnosis not present

## 2017-06-19 DIAGNOSIS — E785 Hyperlipidemia, unspecified: Secondary | ICD-10-CM

## 2017-06-19 DIAGNOSIS — G301 Alzheimer's disease with late onset: Secondary | ICD-10-CM

## 2017-06-19 DIAGNOSIS — F0280 Dementia in other diseases classified elsewhere without behavioral disturbance: Secondary | ICD-10-CM

## 2017-06-19 NOTE — Progress Notes (Signed)
: Provider:  Hennie Duos., MD Location:  [place] Room Number: 507-P Place of Service:  SNF [phone])  PCP: Lawerance Cruel, MD Patient Care Team: Lawerance Cruel, MD as PCP - General (Family Medicine)  Extended Emergency Contact Information Primary Emergency Contact: Sharrie Rothman [place] Phone: [phone] Relation: Daughter     Allergies: Keflex [cephalexin]; Other; and Sulfa antibiotics  Chief Complaint  Patient presents with  . New Admit To SNF    New admission to [facility] SNF    HPI: Patient is 84 y.o. female with hypertension, diabetes, COPD, who had been on steroids and antibiotics on and off since January, wearing Unna boots for cellulitis/phlebitis who presented with complaints of mechanical fall at home, high fevers, nonproductive cough, and hypoxia to 88% on room air. Patient was admitted to [facility] from 3/4-7 for acute respiratory failure with hypoxia secondary to acute COPD exacerbation. Patient was treated with IV steroids then transition to prednisone 40 mg daily and completed a course of empiric doxycycline; patient was positive for coronavirus. Hospital course was complicated by delirium and confusion. Patient is admitted to skilled nursing facility for OT/PT. While at skilled nursing facility patient will be followed for hypertension treated with Lasix, lisinopril, diltiazem diabetes treated with Amaryl and dementia treated with Namenda.  Past Medical History:  Diagnosis Date  . Anemia   . Breast cancer ([place]) 1994  . COPD (chronic obstructive pulmonary disease) ([place])   . Diabetes mellitus without complication ([place])    diet controlled vs. on glimepride  . Hypertension   . NPH (normal pressure hydrocephalus)   . Osteoporosis     Past Surgical History:  Procedure Laterality Date  . CHOLECYSTECTOMY    . COLONOSCOPY WITH PROPOFOL N/A 01/01/2017   Procedure: COLONOSCOPY WITH PROPOFOL;   Surgeon: Laurence Spates, MD;  Location: [facility] ENDOSCOPY;  Service: Endoscopy;  Laterality: N/A;  . MASTECTOMY  1994  . TUBAL LIGATION      Allergies as of 06/19/2017      Reactions   Keflex [cephalexin]    Other    Pt reports "microdentin"   Sulfa Antibiotics       Medication List        Accurate as of 06/19/17  3:47 PM. Always use your most recent med list.          albuterol 108 (90 Base) MCG/ACT inhaler Commonly known as:  PROVENTIL HFA;VENTOLIN HFA Inhale 2 puffs into the lungs every 6 (six) hours as needed for wheezing.   ammonium lactate 12 % lotion Commonly known as:  LAC-HYDRIN Apply 1 application topically 2 (two) times daily.   atorvastatin 10 MG tablet Commonly known as:  LIPITOR Take 10 mg by mouth daily.   budesonide-formoterol 80-4.5 MCG/ACT inhaler Commonly known as:  SYMBICORT Inhale 2 puffs into the lungs 2 (two) times daily.   calcium gluconate 500 MG tablet Take 1 tablet by mouth daily.   cholecalciferol 1000 units tablet Commonly known as:  VITAMIN D Take 1,000 Units by mouth daily.   diltiazem 180 MG 24 hr capsule Commonly known as:  CARDIZEM CD Take 180 mg by mouth daily.   fexofenadine 180 MG tablet Commonly known as:  ALLEGRA Take 180 mg by mouth daily.   Fish Oil 1000 MG Caps Take 1,000 mg by mouth daily.   fluticasone 50 MCG/ACT nasal spray Commonly known as:  FLONASE Place 2 sprays into both nostrils daily.  furosemide 40 MG tablet Commonly known as:  LASIX Take 40 mg by mouth daily.   gabapentin 300 MG capsule Commonly known as:  NEURONTIN Take 300 mg by mouth at bedtime.   glimepiride 1 MG tablet Commonly known as:  AMARYL Take 1 mg by mouth daily with breakfast.   ipratropium-albuterol 0.5-2.5 (3) MG/3ML Soln Commonly known as:  DUONEB Take 3 mLs by nebulization 4 (four) times daily.   iron polysaccharides 150 MG capsule Commonly known as:  NIFEREX Take 150 mg by mouth daily.   l-methylfolate-B6-B12 3-35-2 MG Tabs  tablet Commonly known as:  METANX Take 1 tablet by mouth 2 (two) times daily.   lisinopril 10 MG tablet Commonly known as:  PRINIVIL,ZESTRIL Take 10 mg by mouth daily.   Magnesium 250 MG Tabs Take 250 mg by mouth at bedtime.   memantine 10 MG tablet Commonly known as:  NAMENDA Take 10 mg by mouth 2 (two) times daily.   nitroGLYCERIN 0.4 MG SL tablet Commonly known as:  NITROSTAT Place 0.4 mg under the tongue every 5 (five) minutes as needed for chest pain.   polyethylene glycol packet Commonly known as:  MIRALAX / GLYCOLAX Take 17 g by mouth daily.   predniSONE 10 MG tablet Commonly known as:  DELTASONE Take 3 tabs daily for 2 days, then 2 tabs daily for 2 days, then 1 tab daily for 2 days, then stop.   ranitidine 150 MG tablet Commonly known as:  ZANTAC Take 150 mg by mouth 2 (two) times daily.   sucralfate 1 g tablet Commonly known as:  CARAFATE Take 1 g by mouth 2 (two) times daily.       No orders of the defined types were placed in this encounter.   Immunization History  Administered Date(s) Administered  . Influenza Split 02/10/2005, 02/12/2007  . Influenza, High Dose Seasonal PF 12/31/2016  . Influenza, Seasonal, Injecte, Preservative Fre 02/11/2011  . Influenza-Unspecified 02/03/2012  . Pneumococcal Polysaccharide-23 02/12/2007, 12/23/2010  . Td 12/23/2010    Social History   Tobacco Use  . Smoking status: Never Smoker  . Smokeless tobacco: Former Systems developer    Types: Snuff, Chew  . Tobacco comment: lived with smokers most of her life; oral tobacco for 10 years?  Substance Use Topics  . Alcohol use: No    Family history is   Family History  Problem Relation Age of Onset  . Hypertension Mother   . Diabetes Mother   . Heart failure Mother   . Stroke Mother   . Hypertension Father   . Diabetes Father   . Heart failure Father   . Stroke Father       Review of Systems  DATA OBTAINED: from patient, nurse, daughter GENERAL:  no fevers,  fatigue, appetite changes SKIN: No itching, or rash EYES: No eye pain, redness, discharge EARS: No earache, tinnitus, change in hearing NOSE: No congestion, drainage or bleeding  MOUTH/THROAT: No mouth or tooth pain, No sore throat RESPIRATORY: No cough, wheezing, SOB CARDIAC: No chest pain, palpitations, lower extremity edema  GI: No abdominal pain, No N/V/D or constipation, No heartburn or reflux  GU: No dysuria, frequency or urgency, or incontinence  MUSCULOSKELETAL: No unrelieved bone/joint pain NEUROLOGIC: No headache, dizziness or focal weakness PSYCHIATRIC: No c/o anxiety or sadness   Vitals:   06/19/17 1520  BP: (!) 145/72  Pulse: 77  Resp: 18  Temp: 97.8 F (36.6 C)  SpO2: 97%    SpO2 Readings from Last 1 Encounters:  06/19/17 97%   Body mass index is 32.61 kg/m.     Physical Exam  GENERAL APPEARANCE: Alert, conversant,  No acute distress.  SKIN: No diaphoresis rash HEAD: Normocephalic, atraumatic  EYES: Conjunctiva/lids clear. Pupils round, reactive. EOMs intact.  EARS: External exam WNL, canals clear. Hearing grossly normal.  NOSE: No deformity or discharge.  MOUTH/THROAT: Lips w/o lesions  RESPIRATORY: Breathing is even, unlabored. Lung sounds are clear   CARDIOVASCULAR: Heart RRR no murmurs, rubs or gallops. No peripheral edema.   GASTROINTESTINAL: Abdomen is soft, non-tender, not distended w/ normal bowel sounds. GENITOURINARY: Bladder non tender, not distended  MUSCULOSKELETAL: No abnormal joints or musculature NEUROLOGIC:  Cranial nerves 2-12 grossly intact. Moves all extremities  PSYCHIATRIC: Mood and affect with dementia and confusion, no behavioral issues  Patient Active Problem List   Diagnosis Date Noted  . COPD with acute exacerbation ([place]) 06/15/2017  . CKD (chronic kidney disease) stage 3, GFR 30-59 ml/min ([facility]) 06/15/2017  . Fall at home, initial encounter 06/15/2017  . COPD (chronic obstructive pulmonary disease) ([place]) 12/31/2016  . DM  (diabetes mellitus) ([place]) 12/31/2016  . History of breast cancer 12/31/2016  . HTN (hypertension) 12/31/2016  . Hemorrhoids 12/31/2016  . Nodule of left lung 12/31/2016  . Lower GI bleed 12/30/2016  . Memory loss 10/22/2012      Labs reviewed: Basic Metabolic Panel:    Component Value Date/Time   NA 131 (L) 06/16/2017 0603   K 4.0 06/16/2017 0603   CL 96 (L) 06/16/2017 0603   CO2 26 06/16/2017 0603   GLUCOSE 204 (H) 06/16/2017 0603   BUN 22 (H) 06/16/2017 0603   CREATININE 0.98 06/16/2017 0603   CALCIUM 8.7 (L) 06/16/2017 0603   PROT 7.7 12/30/2016 1715   ALBUMIN 3.7 12/30/2016 1715   AST 30 12/30/2016 1715   ALT 14 12/30/2016 1715   ALKPHOS 110 12/30/2016 1715   BILITOT 0.6 12/30/2016 1715   GFRNONAA 49 (L) 06/16/2017 0603   GFRAA 57 (L) 06/16/2017 0603    Recent Labs    01/01/17 0625 06/15/17 0523 06/16/17 0603  NA 140 132* 131*  K 4.0 3.7 4.0  CL 103 93* 96*  CO2 26 29 26   GLUCOSE 91 137* 204*  BUN 13 15 22*  CREATININE 0.95 1.03* 0.98  CALCIUM 9.1 9.0 8.7*  MG 1.7  --   --    Liver Function Tests: Recent Labs    12/30/16 1715  AST 30  ALT 14  ALKPHOS 110  BILITOT 0.6  PROT 7.7  ALBUMIN 3.7   No results for input(s): LIPASE, AMYLASE in the last 8760 hours. No results for input(s): AMMONIA in the last 8760 hours. CBC: Recent Labs    01/01/17 0625 06/15/17 0523 06/16/17 0603 06/18/17 0421  WBC 5.6 5.1 3.5* 8.3  NEUTROABS 4.6  --   --   --   HGB 11.0* 8.6* 8.2* 8.4*  HCT 33.2* 29.0* 26.8* 27.7*  MCV 90.5 74.7* 74.9* 75.3*  PLT 259 316 331 339   Lipid No results for input(s): CHOL, HDL, LDLCALC, TRIG in the last 8760 hours.  Cardiac Enzymes: No results for input(s): CKTOTAL, CKMB, CKMBINDEX, TROPONINI in the last 8760 hours. BNP: No results for input(s): BNP in the last 8760 hours. No results found for: [facility] Lab Results  Component Value Date   HGBA1C 6.3 (H) 01/01/2017   Lab Results  Component Value Date   TSH 2.458  01/01/2017   Lab Results  Component Value Date   VITAMINB12  2,773 (H) 01/01/2017   Lab Results  Component Value Date   FOLATE 72.0 01/01/2017   Lab Results  Component Value Date   IRON 69 01/01/2017   TIBC 451 (H) 01/01/2017    Imaging and Procedures obtained prior to SNF admission: Dg Ribs Unilateral W/chest Right  Result Date: 06/15/2017 CLINICAL DATA:  Right rib pain after fall going to the bathroom tonight. EXAM: RIGHT RIBS AND CHEST - 3+ VIEW COMPARISON:  Chest CT 06/10/2017 FINDINGS: No fracture or other bone lesions are seen involving the ribs. There is no evidence of pneumothorax or pleural effusion. Chronic lung disease with interstitial coarsening. Heart size and mediastinal contours are within normal limits. There is aortic atherosclerosis. IMPRESSION: 1. No right rib fracture or pulmonary complication. 2. Chronic lung disease appears similar to prior CT. Electronically Signed   By: Jeb Levering M.D.   On: 06/15/2017 05:30   Ct Lumbar Spine Wo Contrast  Result Date: 06/15/2017 CLINICAL DATA:  Fall last night, hip and back pain EXAM: CT LUMBAR SPINE WITHOUT CONTRAST TECHNIQUE: Multidetector CT imaging of the lumbar spine was performed without intravenous contrast administration. Multiplanar CT image reconstructions were also generated. COMPARISON:  Plain film of the lumbar spine dated 06/11/2016. FINDINGS: Segmentation: 5 lumbar type vertebrae. Alignment: Mild levoscoliosis centered at the L2-3 level. No evidence of acute vertebral body subluxation. Vertebrae: Chronic wedge compression deformity of the L1 vertebral body, stable compared to the previous plain film of 06/11/2016. No evidence of acute fracture. No fracture line or displaced fracture fragment. Facet joints appear intact and normally aligned throughout. Small chronic appearing deformity of the left L2 transverse process. Paraspinal and other soft tissues: Aortic atherosclerosis. Visualized paravertebral soft tissues are  otherwise unremarkable. Disc levels: Disc desiccations at the T12-L1 and L1-2 L2 levels, mild to moderate in degree with associated disc space narrowing, vacuum disc phenomenon and mild osseous spurring. Mild disc bulge at L4-5, combined with degenerative hypertrophy of the ligamentum flavum, causing mild to moderate central canal stenosis with possible associated lateral recess narrowings. No more than mild central canal stenosis at any other level of the lumbar spine. IMPRESSION: 1. No acute findings.  No acute fracture or dislocation. 2. Chronic wedge compression deformity of the L1 vertebral body, stable compared to previous plain film of 06/11/2016. No associated vertebral body retropulsion. 3. Degenerative changes within the upper and lower lumbar spine, with associated disc bulge at L4-5 causing mild to moderate central canal stenosis. Electronically Signed   By: Franki Cabot M.D.   On: 06/15/2017 07:43   Ct Pelvis Wo Contrast  Result Date: 06/15/2017 CLINICAL DATA:  Fall last night, hip and back pain. EXAM: CT PELVIS WITHOUT CONTRAST TECHNIQUE: Multidetector CT imaging of the pelvis was performed following the standard protocol without intravenous contrast. COMPARISON:  None. FINDINGS: Urinary Tract:  Bladder and distal ureters are unremarkable. Bowel: Extensive diverticulosis of the sigmoid colon and lower descending colon but no focal inflammatory change to suggest acute diverticulitis. No dilated large or small bowel loops within the pelvis or lower abdomen. Vascular/Lymphatic: No acute findings.  Atherosclerosis. Reproductive:  No mass or other significant abnormality Other: No free fluid or hemorrhage within the pelvis. No free intraperitoneal air. Musculoskeletal: No osseous fracture or dislocation. Superficial soft tissues about the pelvis and right hip are unremarkable. IMPRESSION: 1. No acute findings. No osseous fracture or dislocation within the pelvis or at the right hip. 2. Colonic  diverticulosis without evidence of acute diverticulitis. 3. Atherosclerosis. Electronically Signed  By: Franki Cabot M.D.   On: 06/15/2017 07:35   Dg Hip Unilat With Pelvis 2-3 Views Right  Result Date: 06/15/2017 CLINICAL DATA:  Right hip pain after fall going to the bathroom tonight. EXAM: DG HIP (WITH OR WITHOUT PELVIS) 2-3V RIGHT COMPARISON:  None. FINDINGS: The cortical margins of the bony pelvis and right hip are intact. No fracture. Pubic symphysis and sacroiliac joints are congruent. Both femoral heads are well-seated in the respective acetabula. Mild for age degenerative change of both hips. IMPRESSION: Negative for fracture of the pelvis or right hip. Electronically Signed   By: Jeb Levering M.D.   On: 06/15/2017 05:28     Not all labs, radiology exams or other studies done during hospitalization come through on my EPIC note; however they are reviewed by me.    Assessment and Plan  Acute respiratory failure with hypoxia/COPD exacerbation/corona virus positive-initially started on IV Solu-Medrol then transition to oral prednisone 40 mg daily; started on doxycycline; repeat chest x-ray on 3/6 showed chronic bronchitic changes without pneumonia or heart failure; requiring oxygen with flutter valve  at discharge although patient was not on home O2 prior SNF - admitted for OT/PT; continue O2 2 L nasal cannula and wean as tolerated; continue doxycycline 100 mg twice a day with stop date of 06/20/17; continue rapid taper of prednisone over the next 6 days; and tingling when necessary albuterol, Symbicort twice a day scheduled  Mechanical fall at home/multiple falls-no fractures SNF - OT/PT  Dementia/NPH/confusion-possibly improve, may be a baseline SNF - OT/PT; supportive care; continue Namenda 10 mg twice a day  Diabetes mellitus type 2-A1c 6.3; this CBGs uncontrolled in hospital secondary to steroids and should improve once tapered over SNF - restart Amaryl 1 mg by mouth daily this  CBGs every morning; patient is on ACE and statin  Hypertension SNF - control for age; continue diltiazem 180 mg daily, Lasix 40 mg daily, lisinopril 10 mg daily  Hyperlipidemia SNF - continue Lipitor 10 mg daily  Neuropathy SNF - no complaints; continue Neurontin 300 mg daily at bedtime  GERD SNF - continue Zantac 150 mg twice a day and Carafate 1 g twice a day  Lung nodules-right middle lobe nodular densities SNF - follow-up CT in 6 months    Time spent greater than 45 minutes;> 50% of time with patient was spent reviewing records, labs, tests and studies, counseling and developing plan of care  Webb Silversmith D. Sheppard Coil,  MD

## 2017-06-20 DIAGNOSIS — K219 Gastro-esophageal reflux disease without esophagitis: Secondary | ICD-10-CM | POA: Insufficient documentation

## 2017-06-20 DIAGNOSIS — E785 Hyperlipidemia, unspecified: Secondary | ICD-10-CM

## 2017-06-20 DIAGNOSIS — F0390 Unspecified dementia without behavioral disturbance: Secondary | ICD-10-CM | POA: Insufficient documentation

## 2017-06-20 DIAGNOSIS — E1169 Type 2 diabetes mellitus with other specified complication: Secondary | ICD-10-CM | POA: Insufficient documentation

## 2017-06-20 DIAGNOSIS — W19XXXA Unspecified fall, initial encounter: Secondary | ICD-10-CM | POA: Insufficient documentation

## 2017-06-20 DIAGNOSIS — R410 Disorientation, unspecified: Secondary | ICD-10-CM | POA: Insufficient documentation

## 2017-06-20 DIAGNOSIS — G912 (Idiopathic) normal pressure hydrocephalus: Secondary | ICD-10-CM | POA: Insufficient documentation

## 2017-06-20 DIAGNOSIS — J9601 Acute respiratory failure with hypoxia: Secondary | ICD-10-CM | POA: Insufficient documentation

## 2017-06-20 DIAGNOSIS — R911 Solitary pulmonary nodule: Secondary | ICD-10-CM | POA: Insufficient documentation

## 2017-06-20 DIAGNOSIS — E1040 Type 1 diabetes mellitus with diabetic neuropathy, unspecified: Secondary | ICD-10-CM | POA: Insufficient documentation

## 2017-07-08 ENCOUNTER — Encounter: Payer: Self-pay | Admitting: *Deleted

## 2017-07-08 ENCOUNTER — Other Ambulatory Visit: Payer: Self-pay | Admitting: *Deleted

## 2017-07-08 NOTE — Patient Outreach (Signed)
[facility]) Care Management  07/08/2017  Jazsmine Macari [birth year] [id number]  Met with patient and her daughter, [name].  Patient lives with daughter, patient daughter works from a home office and is available 24/7. They provide transportation to appointments and meals, daughter assists with some ADLs such as shower. Patient has hospital bed and shower chair, they are considering getting a lift chair.  Patient has lived with daughter for 5 years and plan is to not have patient remain in LTC, but return home.  Daughter voices some caregiver stress and would appreciate any care coordination that they can get.  Patient had home care before her recent fall and hospitalization for una boot wrapping, the facility is continuing to wrap patient legs.  Patient has a hx of COPD, DM, HTN, CKD, NPH,and falls.  Patient and daughter agree to [facility] program services. They feel a nurse for transition of care is best for now.  If they need a Education officer, museum or pharmacy the nurse can determine and assess needs during transition of care.   Consent obtained, contact is [name], daughter, [place] and Chauncey [place] [phone].  Plan to make referral for [facility] care management.  Discharge date to be determined, should be next week.   Royetta Crochet. Laymond Purser, RN, BSN, [place] [phone]) Business Cell  [phone]) Toll Free Office

## 2017-07-15 ENCOUNTER — Encounter: Payer: Self-pay | Admitting: Adult Health

## 2017-07-15 ENCOUNTER — Non-Acute Institutional Stay (SKILLED_NURSING_FACILITY): Payer: Medicare Other | Admitting: Adult Health

## 2017-07-15 DIAGNOSIS — G912 (Idiopathic) normal pressure hydrocephalus: Secondary | ICD-10-CM | POA: Diagnosis not present

## 2017-07-15 DIAGNOSIS — G301 Alzheimer's disease with late onset: Secondary | ICD-10-CM

## 2017-07-15 DIAGNOSIS — J449 Chronic obstructive pulmonary disease, unspecified: Secondary | ICD-10-CM | POA: Diagnosis not present

## 2017-07-15 DIAGNOSIS — F0280 Dementia in other diseases classified elsewhere without behavioral disturbance: Secondary | ICD-10-CM

## 2017-07-15 NOTE — Progress Notes (Addendum)
Location:   [place] Room Number: [place] of Service:  SNF (31)    CODE STATUS: DNR  Allergies  Allergen Reactions  . Keflex [Cephalexin]   . Other     Pt reports "microdentin"  . Sulfa Antibiotics     Chief Complaint  Patient presents with  . Discharge Note    Discharging to Home on 07/16/17    HPI:  She is being discharged to home with home health for pt/ot/rn/cna. She will not need dme. She will qualify for home 02 at 2L/[place]: her sat was 87%. . She will need her prescriptions written and will need to follow up with her medical provider.  She had been hospitalized for acute exacerbation of her copd. She was admitted to this facility for short tern rehab and is now ready for discharge home. She will continue therapy through home health.     Past Medical History:  Diagnosis Date  . Anemia   . Breast cancer ([place]) 1994  . COPD (chronic obstructive pulmonary disease) ([place])   . Diabetes mellitus without complication ([place])    diet controlled vs. on glimepride  . Hypertension   . NPH (normal pressure hydrocephalus)   . Osteoporosis     Past Surgical History:  Procedure Laterality Date  . CHOLECYSTECTOMY    . COLONOSCOPY WITH PROPOFOL N/A 01/01/2017   Procedure: COLONOSCOPY WITH PROPOFOL;  Surgeon: Laurence Spates, MD;  Location: [facility];  Service: Endoscopy;  Laterality: N/A;  . MASTECTOMY  1994  . TUBAL LIGATION      Social History   Socioeconomic History  . Marital status: Widowed    Spouse name: Not on file  . Number of children: Not on file  . Years of education: Not on file  . Highest education level: Not on file  Occupational History  . Occupation: retired  Scientific laboratory technician  . Financial resource strain: Not on file  . Food insecurity:    Worry: Not on file    Inability: Not on file  . Transportation needs:    Medical: Not on file    Non-medical: Not on file  Tobacco Use  . Smoking status: Never Smoker  . Smokeless  tobacco: Former Systems developer    Types: Snuff, Chew  . Tobacco comment: lived with smokers most of her life; oral tobacco for 10 years?  Substance and Sexual Activity  . Alcohol use: No  . Drug use: No  . Sexual activity: Not on file  Lifestyle  . Physical activity:    Days per week: Not on file    Minutes per session: Not on file  . Stress: Not on file  Relationships  . Social connections:    Talks on phone: Not on file    Gets together: Not on file    Attends religious service: Not on file    Active member of club or organization: Not on file    Attends meetings of clubs or organizations: Not on file    Relationship status: Not on file  . Intimate partner violence:    Fear of current or ex partner: Not on file    Emotionally abused: Not on file    Physically abused: Not on file    Forced sexual activity: Not on file  Other Topics Concern  . Not on file  Social History Narrative   Lives with daughter and son-in-law.  Widowed.   Family History  Problem Relation Age of Onset  .  Hypertension Mother   . Diabetes Mother   . Heart failure Mother   . Stroke Mother   . Hypertension Father   . Diabetes Father   . Heart failure Father   . Stroke Father     VITAL SIGNS BP 140/70   Pulse 88   Temp 97.9 F (36.6 C)   Resp 18   Ht 5' (1.524 m)   Wt 184 lb (83.5 kg)   BMI 35.94 kg/m   Patient's Medications  New Prescriptions   No medications on file  Previous Medications   ACETAMINOPHEN (TYLENOL) 325 MG TABLET    Take 650 mg by mouth every 6 (six) hours as needed.   ALBUTEROL (PROVENTIL HFA;VENTOLIN HFA) 108 (90 BASE) MCG/ACT INHALER    Inhale 2 puffs into the lungs every 6 (six) hours as needed for wheezing.    ATORVASTATIN (LIPITOR) 10 MG TABLET    Take 10 mg by mouth daily.   BUDESONIDE-FORMOTEROL (SYMBICORT) 80-4.5 MCG/ACT INHALER    Inhale 2 puffs into the lungs 2 (two) times daily.   CALCIUM GLUCONATE 500 MG TABLET    Take 1 tablet by mouth daily.   CHOLECALCIFEROL  (VITAMIN D) 1000 UNITS TABLET    Take 1,000 Units by mouth daily.   DILTIAZEM (CARDIZEM CD) 180 MG 24 HR CAPSULE    Take 180 mg by mouth daily.   FEXOFENADINE (ALLEGRA) 180 MG TABLET    Take 180 mg by mouth daily.   FLUTICASONE (FLONASE) 50 MCG/ACT NASAL SPRAY    Place 2 sprays into both nostrils daily.   FUROSEMIDE (LASIX) 40 MG TABLET    Take 40 mg by mouth daily.   GABAPENTIN (NEURONTIN) 300 MG CAPSULE    Take 300 mg by mouth at bedtime.    GLIMEPIRIDE (AMARYL) 1 MG TABLET    Take 1 mg by mouth daily with breakfast.   IPRATROPIUM-ALBUTEROL (DUONEB) 0.5-2.5 (3) MG/3ML SOLN    Take 3 mLs by nebulization 4 (four) times daily.    IRON POLYSACCHARIDES (NIFEREX) 150 MG CAPSULE    Take 150 mg by mouth daily.   L-METHYLFOLATE-B6-B12 (METANX) 3-35-2 MG TABS TABLET    Take 1 tablet by mouth 2 (two) times daily.    LISINOPRIL (PRINIVIL,ZESTRIL) 10 MG TABLET    Take 10 mg by mouth daily.   MAGNESIUM 250 MG TABS    Take 250 mg by mouth at bedtime.   MEMANTINE (NAMENDA) 10 MG TABLET    Take 10 mg by mouth 2 (two) times daily.   NITROGLYCERIN (NITROSTAT) 0.4 MG SL TABLET    Place 0.4 mg under the tongue every 5 (five) minutes as needed for chest pain.   OMEGA-3 FATTY ACIDS (FISH OIL) 1000 MG CAPS    Take 1,000 mg by mouth daily.   POLYETHYLENE GLYCOL (MIRALAX / GLYCOLAX) PACKET    Take 17 g by mouth daily.   RANITIDINE (ZANTAC) 150 MG TABLET    Take 150 mg by mouth 2 (two) times daily.   SUCRALFATE (CARAFATE) 1 G TABLET    Take 1 g by mouth 2 (two) times daily.  Modified Medications   No medications on file  Discontinued Medications   AMMONIUM LACTATE (LAC-HYDRIN) 12 % LOTION    Apply 1 application topically 2 (two) times daily.   PREDNISONE (DELTASONE) 10 MG TABLET    Take 3 tabs daily for 2 days, then 2 tabs daily for 2 days, then 1 tab daily for 2 days, then stop.     SIGNIFICANT  DIAGNOSTIC EXAMS   LABS REVIEWED TODAY:   06-16-17: wbc 3.5; hgb 8.2; hct 26.8; mcv 74,9; plt 331; glucose 204; bun  22; creat 0.98; k+ 4.0; na++ 131 ca 8.7     Review of Systems  Constitutional: Negative for malaise/fatigue.  Respiratory: Negative for cough and shortness of breath.   Cardiovascular: Negative for chest pain, palpitations and leg swelling.  Gastrointestinal: Negative for abdominal pain, constipation and heartburn.  Musculoskeletal: Negative for back pain, joint pain and myalgias.  Skin: Negative.   Neurological: Negative for dizziness.  Psychiatric/Behavioral: The patient is not nervous/anxious.     Physical Exam  Constitutional: She is oriented to person, place, and time. No distress.  Thin   Neck: No thyromegaly present.  Cardiovascular: Normal rate, regular rhythm, normal heart sounds and intact distal pulses.  Pulmonary/Chest: Effort normal and breath sounds normal. No respiratory distress.  Abdominal: Soft. Bowel sounds are normal. She exhibits no distension. There is no tenderness.  Musculoskeletal: Normal range of motion. She exhibits no edema.  Lymphadenopathy:    She has no cervical adenopathy.  Neurological: She is alert and oriented to person, place, and time.  Skin: Skin is warm and dry. She is not diaphoretic.  Psychiatric: She has a normal mood and affect.      ASSESSMENT/ PLAN:  Patient is being discharged with the following home health services:  Pt/ot/rn/cna: to evaluate and treat as indicated for gait; balance; strength; adl training; medication management and adl care  Patient is being discharged with the following durable medical equipment: will require gaseous and portable 02 at 2L/[place].   Patient has been advised to f/u with their PCP in 1-2 weeks to bring them up to date on their rehab stay.  Social services at facility was responsible for arranging this appointment.  Pt was provided with a 30 day supply of prescriptions for medications and refills must be obtained from their PCP.  For controlled substances, a more limited supply may be provided adequate  until PCP appointment only.  A 30 day supply of her prescription medications have been written per the list as above   Time spent with patient 35 minutes: discussed home health needs; expectations; on dme needed; and medications; verbalized understanding.    Ok Edwards NP [facility]  Contact [phone] Monday through Friday 8am- 5pm  After hours call [phone]

## 2017-07-17 ENCOUNTER — Other Ambulatory Visit: Payer: Self-pay | Admitting: *Deleted

## 2017-07-17 ENCOUNTER — Encounter: Payer: Self-pay | Admitting: *Deleted

## 2017-07-17 NOTE — Patient Outreach (Signed)
[facility]) Care Management  07/17/2017  Candice Miller [birth year] [id number]    RN spoke with daughter [name] [facility]) and intorduced the [facility] program and purpose for today's call. Inquired if this was a good time to talk. Caregiver indicated she works during the week so a quick conversation is all she has available and now would be the best time. RN briefly explained the services and [facility] differs from [facility]. RN also verified [facility] will be involved with pt for PT services. Caregiver states pt has home O2 and current the only problems is the extended tubing. RN encouraged to cover with rug or place to the wall to prevent falls/injuries. Also encouraged caregiver to place the main concentrator in the room pt spends most of her time. RN offered to review all medications however decline at this time. States she manages all of the pt's medications and transportation to all medical appointments pending primary visit on 07/30/2017. RN offered further engagement with home visit and transition of care calls however not needed at this time based upon the [facility] involvement. No issues or needs to address at this time however appreciative. Caregiver indicated if she is in need of [facility] she would contact us directly. RN provider a contact name and number for further involvement in needed in the future. Case will be non-active at this time and primay provider will be notified.  Raina Mina, RN Care Management Coordinator [place] Office [phone]

## 2017-07-24 DIAGNOSIS — J449 Chronic obstructive pulmonary disease, unspecified: Secondary | ICD-10-CM | POA: Diagnosis not present

## 2017-07-24 DIAGNOSIS — M810 Age-related osteoporosis without current pathological fracture: Secondary | ICD-10-CM | POA: Diagnosis not present

## 2017-07-24 DIAGNOSIS — E11628 Type 2 diabetes mellitus with other skin complications: Secondary | ICD-10-CM | POA: Diagnosis not present

## 2017-07-24 DIAGNOSIS — I10 Essential (primary) hypertension: Secondary | ICD-10-CM | POA: Diagnosis not present

## 2017-07-24 DIAGNOSIS — I872 Venous insufficiency (chronic) (peripheral): Secondary | ICD-10-CM | POA: Diagnosis not present

## 2017-07-24 DIAGNOSIS — Z4800 Encounter for change or removal of nonsurgical wound dressing: Secondary | ICD-10-CM | POA: Diagnosis not present

## 2017-07-27 DIAGNOSIS — E11628 Type 2 diabetes mellitus with other skin complications: Secondary | ICD-10-CM | POA: Diagnosis not present

## 2017-07-27 DIAGNOSIS — J449 Chronic obstructive pulmonary disease, unspecified: Secondary | ICD-10-CM | POA: Diagnosis not present

## 2017-07-27 DIAGNOSIS — I872 Venous insufficiency (chronic) (peripheral): Secondary | ICD-10-CM | POA: Diagnosis not present

## 2017-07-27 DIAGNOSIS — M810 Age-related osteoporosis without current pathological fracture: Secondary | ICD-10-CM | POA: Diagnosis not present

## 2017-07-27 DIAGNOSIS — Z4800 Encounter for change or removal of nonsurgical wound dressing: Secondary | ICD-10-CM | POA: Diagnosis not present

## 2017-07-27 DIAGNOSIS — I10 Essential (primary) hypertension: Secondary | ICD-10-CM | POA: Diagnosis not present

## 2017-07-28 DIAGNOSIS — Z4800 Encounter for change or removal of nonsurgical wound dressing: Secondary | ICD-10-CM | POA: Diagnosis not present

## 2017-07-28 DIAGNOSIS — E11628 Type 2 diabetes mellitus with other skin complications: Secondary | ICD-10-CM | POA: Diagnosis not present

## 2017-07-28 DIAGNOSIS — I10 Essential (primary) hypertension: Secondary | ICD-10-CM | POA: Diagnosis not present

## 2017-07-28 DIAGNOSIS — J449 Chronic obstructive pulmonary disease, unspecified: Secondary | ICD-10-CM | POA: Diagnosis not present

## 2017-07-28 DIAGNOSIS — M810 Age-related osteoporosis without current pathological fracture: Secondary | ICD-10-CM | POA: Diagnosis not present

## 2017-07-28 DIAGNOSIS — I872 Venous insufficiency (chronic) (peripheral): Secondary | ICD-10-CM | POA: Diagnosis not present

## 2017-07-29 DIAGNOSIS — M810 Age-related osteoporosis without current pathological fracture: Secondary | ICD-10-CM | POA: Diagnosis not present

## 2017-07-29 DIAGNOSIS — I872 Venous insufficiency (chronic) (peripheral): Secondary | ICD-10-CM | POA: Diagnosis not present

## 2017-07-29 DIAGNOSIS — J449 Chronic obstructive pulmonary disease, unspecified: Secondary | ICD-10-CM | POA: Diagnosis not present

## 2017-07-29 DIAGNOSIS — Z4800 Encounter for change or removal of nonsurgical wound dressing: Secondary | ICD-10-CM | POA: Diagnosis not present

## 2017-07-29 DIAGNOSIS — I10 Essential (primary) hypertension: Secondary | ICD-10-CM | POA: Diagnosis not present

## 2017-07-29 DIAGNOSIS — E11628 Type 2 diabetes mellitus with other skin complications: Secondary | ICD-10-CM | POA: Diagnosis not present

## 2017-07-30 DIAGNOSIS — D649 Anemia, unspecified: Secondary | ICD-10-CM | POA: Diagnosis not present

## 2017-07-30 DIAGNOSIS — Z79899 Other long term (current) drug therapy: Secondary | ICD-10-CM | POA: Diagnosis not present

## 2017-07-30 DIAGNOSIS — I872 Venous insufficiency (chronic) (peripheral): Secondary | ICD-10-CM | POA: Diagnosis not present

## 2017-07-30 DIAGNOSIS — I10 Essential (primary) hypertension: Secondary | ICD-10-CM | POA: Diagnosis not present

## 2017-07-30 DIAGNOSIS — Z4800 Encounter for change or removal of nonsurgical wound dressing: Secondary | ICD-10-CM | POA: Diagnosis not present

## 2017-07-30 DIAGNOSIS — M810 Age-related osteoporosis without current pathological fracture: Secondary | ICD-10-CM | POA: Diagnosis not present

## 2017-07-30 DIAGNOSIS — R609 Edema, unspecified: Secondary | ICD-10-CM | POA: Diagnosis not present

## 2017-07-30 DIAGNOSIS — E11628 Type 2 diabetes mellitus with other skin complications: Secondary | ICD-10-CM | POA: Diagnosis not present

## 2017-07-30 DIAGNOSIS — J449 Chronic obstructive pulmonary disease, unspecified: Secondary | ICD-10-CM | POA: Diagnosis not present

## 2017-07-31 DIAGNOSIS — J449 Chronic obstructive pulmonary disease, unspecified: Secondary | ICD-10-CM | POA: Diagnosis not present

## 2017-07-31 DIAGNOSIS — M810 Age-related osteoporosis without current pathological fracture: Secondary | ICD-10-CM | POA: Diagnosis not present

## 2017-07-31 DIAGNOSIS — E11628 Type 2 diabetes mellitus with other skin complications: Secondary | ICD-10-CM | POA: Diagnosis not present

## 2017-07-31 DIAGNOSIS — I872 Venous insufficiency (chronic) (peripheral): Secondary | ICD-10-CM | POA: Diagnosis not present

## 2017-07-31 DIAGNOSIS — I10 Essential (primary) hypertension: Secondary | ICD-10-CM | POA: Diagnosis not present

## 2017-07-31 DIAGNOSIS — Z4800 Encounter for change or removal of nonsurgical wound dressing: Secondary | ICD-10-CM | POA: Diagnosis not present

## 2017-08-03 DIAGNOSIS — I872 Venous insufficiency (chronic) (peripheral): Secondary | ICD-10-CM | POA: Diagnosis not present

## 2017-08-03 DIAGNOSIS — J449 Chronic obstructive pulmonary disease, unspecified: Secondary | ICD-10-CM | POA: Diagnosis not present

## 2017-08-03 DIAGNOSIS — E11628 Type 2 diabetes mellitus with other skin complications: Secondary | ICD-10-CM | POA: Diagnosis not present

## 2017-08-03 DIAGNOSIS — M810 Age-related osteoporosis without current pathological fracture: Secondary | ICD-10-CM | POA: Diagnosis not present

## 2017-08-03 DIAGNOSIS — Z4800 Encounter for change or removal of nonsurgical wound dressing: Secondary | ICD-10-CM | POA: Diagnosis not present

## 2017-08-03 DIAGNOSIS — I10 Essential (primary) hypertension: Secondary | ICD-10-CM | POA: Diagnosis not present

## 2017-08-04 DIAGNOSIS — J449 Chronic obstructive pulmonary disease, unspecified: Secondary | ICD-10-CM | POA: Diagnosis not present

## 2017-08-04 DIAGNOSIS — E11628 Type 2 diabetes mellitus with other skin complications: Secondary | ICD-10-CM | POA: Diagnosis not present

## 2017-08-04 DIAGNOSIS — M810 Age-related osteoporosis without current pathological fracture: Secondary | ICD-10-CM | POA: Diagnosis not present

## 2017-08-04 DIAGNOSIS — Z4800 Encounter for change or removal of nonsurgical wound dressing: Secondary | ICD-10-CM | POA: Diagnosis not present

## 2017-08-04 DIAGNOSIS — I10 Essential (primary) hypertension: Secondary | ICD-10-CM | POA: Diagnosis not present

## 2017-08-04 DIAGNOSIS — I872 Venous insufficiency (chronic) (peripheral): Secondary | ICD-10-CM | POA: Diagnosis not present

## 2017-08-05 DIAGNOSIS — I679 Cerebrovascular disease, unspecified: Secondary | ICD-10-CM | POA: Diagnosis not present

## 2017-08-05 DIAGNOSIS — Z9981 Dependence on supplemental oxygen: Secondary | ICD-10-CM | POA: Diagnosis not present

## 2017-08-05 DIAGNOSIS — Z7984 Long term (current) use of oral hypoglycemic drugs: Secondary | ICD-10-CM | POA: Diagnosis not present

## 2017-08-05 DIAGNOSIS — I129 Hypertensive chronic kidney disease with stage 1 through stage 4 chronic kidney disease, or unspecified chronic kidney disease: Secondary | ICD-10-CM | POA: Diagnosis not present

## 2017-08-05 DIAGNOSIS — Z7951 Long term (current) use of inhaled steroids: Secondary | ICD-10-CM | POA: Diagnosis not present

## 2017-08-05 DIAGNOSIS — I872 Venous insufficiency (chronic) (peripheral): Secondary | ICD-10-CM | POA: Diagnosis not present

## 2017-08-05 DIAGNOSIS — J441 Chronic obstructive pulmonary disease with (acute) exacerbation: Secondary | ICD-10-CM | POA: Diagnosis not present

## 2017-08-05 DIAGNOSIS — D631 Anemia in chronic kidney disease: Secondary | ICD-10-CM | POA: Diagnosis not present

## 2017-08-05 DIAGNOSIS — G912 (Idiopathic) normal pressure hydrocephalus: Secondary | ICD-10-CM | POA: Diagnosis not present

## 2017-08-05 DIAGNOSIS — N183 Chronic kidney disease, stage 3 (moderate): Secondary | ICD-10-CM | POA: Diagnosis not present

## 2017-08-05 DIAGNOSIS — M810 Age-related osteoporosis without current pathological fracture: Secondary | ICD-10-CM | POA: Diagnosis not present

## 2017-08-05 DIAGNOSIS — Z9181 History of falling: Secondary | ICD-10-CM | POA: Diagnosis not present

## 2017-08-05 DIAGNOSIS — E11628 Type 2 diabetes mellitus with other skin complications: Secondary | ICD-10-CM | POA: Diagnosis not present

## 2017-08-05 DIAGNOSIS — R300 Dysuria: Secondary | ICD-10-CM | POA: Diagnosis not present

## 2017-08-05 DIAGNOSIS — F0390 Unspecified dementia without behavioral disturbance: Secondary | ICD-10-CM | POA: Diagnosis not present

## 2017-08-06 DIAGNOSIS — J441 Chronic obstructive pulmonary disease with (acute) exacerbation: Secondary | ICD-10-CM | POA: Diagnosis not present

## 2017-08-06 DIAGNOSIS — I872 Venous insufficiency (chronic) (peripheral): Secondary | ICD-10-CM | POA: Diagnosis not present

## 2017-08-06 DIAGNOSIS — N183 Chronic kidney disease, stage 3 (moderate): Secondary | ICD-10-CM | POA: Diagnosis not present

## 2017-08-06 DIAGNOSIS — M810 Age-related osteoporosis without current pathological fracture: Secondary | ICD-10-CM | POA: Diagnosis not present

## 2017-08-06 DIAGNOSIS — E11628 Type 2 diabetes mellitus with other skin complications: Secondary | ICD-10-CM | POA: Diagnosis not present

## 2017-08-06 DIAGNOSIS — I129 Hypertensive chronic kidney disease with stage 1 through stage 4 chronic kidney disease, or unspecified chronic kidney disease: Secondary | ICD-10-CM | POA: Diagnosis not present

## 2017-08-07 DIAGNOSIS — I129 Hypertensive chronic kidney disease with stage 1 through stage 4 chronic kidney disease, or unspecified chronic kidney disease: Secondary | ICD-10-CM | POA: Diagnosis not present

## 2017-08-07 DIAGNOSIS — J441 Chronic obstructive pulmonary disease with (acute) exacerbation: Secondary | ICD-10-CM | POA: Diagnosis not present

## 2017-08-07 DIAGNOSIS — I872 Venous insufficiency (chronic) (peripheral): Secondary | ICD-10-CM | POA: Diagnosis not present

## 2017-08-07 DIAGNOSIS — E11628 Type 2 diabetes mellitus with other skin complications: Secondary | ICD-10-CM | POA: Diagnosis not present

## 2017-08-07 DIAGNOSIS — M810 Age-related osteoporosis without current pathological fracture: Secondary | ICD-10-CM | POA: Diagnosis not present

## 2017-08-07 DIAGNOSIS — N183 Chronic kidney disease, stage 3 (moderate): Secondary | ICD-10-CM | POA: Diagnosis not present

## 2017-08-10 ENCOUNTER — Encounter (HOSPITAL_COMMUNITY): Payer: Self-pay | Admitting: Internal Medicine

## 2017-08-10 ENCOUNTER — Emergency Department (HOSPITAL_COMMUNITY)
Admission: EM | Admit: 2017-08-10 | Discharge: 2017-08-10 | Disposition: A | Discharge status: Home / Self Care | Payer: Medicare Other | Source: Home / Self Care | Attending: Emergency Medicine | Admitting: Emergency Medicine

## 2017-08-10 ENCOUNTER — Emergency Department (HOSPITAL_COMMUNITY): Payer: Medicare Other

## 2017-08-10 ENCOUNTER — Other Ambulatory Visit: Payer: Self-pay

## 2017-08-10 DIAGNOSIS — J189 Pneumonia, unspecified organism: Secondary | ICD-10-CM | POA: Diagnosis not present

## 2017-08-10 DIAGNOSIS — R270 Ataxia, unspecified: Secondary | ICD-10-CM

## 2017-08-10 DIAGNOSIS — I471 Supraventricular tachycardia: Secondary | ICD-10-CM | POA: Diagnosis not present

## 2017-08-10 DIAGNOSIS — S7001XA Contusion of right hip, initial encounter: Secondary | ICD-10-CM | POA: Diagnosis not present

## 2017-08-10 DIAGNOSIS — Z79899 Other long term (current) drug therapy: Secondary | ICD-10-CM

## 2017-08-10 DIAGNOSIS — E1022 Type 1 diabetes mellitus with diabetic chronic kidney disease: Secondary | ICD-10-CM

## 2017-08-10 DIAGNOSIS — J449 Chronic obstructive pulmonary disease, unspecified: Secondary | ICD-10-CM | POA: Insufficient documentation

## 2017-08-10 DIAGNOSIS — J9620 Acute and chronic respiratory failure, unspecified whether with hypoxia or hypercapnia: Secondary | ICD-10-CM | POA: Diagnosis not present

## 2017-08-10 DIAGNOSIS — F0390 Unspecified dementia without behavioral disturbance: Secondary | ICD-10-CM

## 2017-08-10 DIAGNOSIS — T148XXA Other injury of unspecified body region, initial encounter: Secondary | ICD-10-CM | POA: Diagnosis not present

## 2017-08-10 DIAGNOSIS — R260 Ataxic gait: Secondary | ICD-10-CM

## 2017-08-10 DIAGNOSIS — R531 Weakness: Secondary | ICD-10-CM | POA: Diagnosis not present

## 2017-08-10 DIAGNOSIS — R509 Fever, unspecified: Secondary | ICD-10-CM | POA: Diagnosis not present

## 2017-08-10 DIAGNOSIS — M25551 Pain in right hip: Secondary | ICD-10-CM | POA: Insufficient documentation

## 2017-08-10 DIAGNOSIS — J181 Lobar pneumonia, unspecified organism: Secondary | ICD-10-CM | POA: Diagnosis not present

## 2017-08-10 DIAGNOSIS — Z87891 Personal history of nicotine dependence: Secondary | ICD-10-CM

## 2017-08-10 DIAGNOSIS — M545 Low back pain: Secondary | ICD-10-CM | POA: Diagnosis not present

## 2017-08-10 DIAGNOSIS — Z7984 Long term (current) use of oral hypoglycemic drugs: Secondary | ICD-10-CM | POA: Insufficient documentation

## 2017-08-10 DIAGNOSIS — Z66 Do not resuscitate: Secondary | ICD-10-CM | POA: Diagnosis not present

## 2017-08-10 DIAGNOSIS — N183 Chronic kidney disease, stage 3 (moderate): Secondary | ICD-10-CM | POA: Insufficient documentation

## 2017-08-10 DIAGNOSIS — R918 Other nonspecific abnormal finding of lung field: Secondary | ICD-10-CM | POA: Diagnosis not present

## 2017-08-10 DIAGNOSIS — W19XXXA Unspecified fall, initial encounter: Secondary | ICD-10-CM

## 2017-08-10 DIAGNOSIS — Z853 Personal history of malignant neoplasm of breast: Secondary | ICD-10-CM | POA: Insufficient documentation

## 2017-08-10 DIAGNOSIS — S300XXA Contusion of lower back and pelvis, initial encounter: Secondary | ICD-10-CM | POA: Diagnosis not present

## 2017-08-10 DIAGNOSIS — M549 Dorsalgia, unspecified: Secondary | ICD-10-CM | POA: Diagnosis not present

## 2017-08-10 DIAGNOSIS — S0990XA Unspecified injury of head, initial encounter: Secondary | ICD-10-CM | POA: Diagnosis not present

## 2017-08-10 DIAGNOSIS — J440 Chronic obstructive pulmonary disease with acute lower respiratory infection: Secondary | ICD-10-CM | POA: Diagnosis not present

## 2017-08-10 DIAGNOSIS — M4855XA Collapsed vertebra, not elsewhere classified, thoracolumbar region, initial encounter for fracture: Secondary | ICD-10-CM | POA: Diagnosis not present

## 2017-08-10 DIAGNOSIS — R51 Headache: Secondary | ICD-10-CM | POA: Diagnosis not present

## 2017-08-10 DIAGNOSIS — I129 Hypertensive chronic kidney disease with stage 1 through stage 4 chronic kidney disease, or unspecified chronic kidney disease: Secondary | ICD-10-CM

## 2017-08-10 DIAGNOSIS — S79911A Unspecified injury of right hip, initial encounter: Secondary | ICD-10-CM | POA: Diagnosis not present

## 2017-08-10 DIAGNOSIS — R0789 Other chest pain: Secondary | ICD-10-CM | POA: Diagnosis not present

## 2017-08-10 DIAGNOSIS — R0602 Shortness of breath: Secondary | ICD-10-CM | POA: Diagnosis not present

## 2017-08-10 DIAGNOSIS — T07XXXA Unspecified multiple injuries, initial encounter: Secondary | ICD-10-CM

## 2017-08-10 DIAGNOSIS — M5489 Other dorsalgia: Secondary | ICD-10-CM | POA: Diagnosis not present

## 2017-08-10 LAB — CBC WITH DIFFERENTIAL/PLATELET
BASOS ABS: 0 10*3/uL (ref 0.0–0.1)
BASOS PCT: 0 %
EOS ABS: 0.1 10*3/uL (ref 0.0–0.7)
Eosinophils Relative: 1 %
HCT: 30.1 % — ABNORMAL LOW (ref 36.0–46.0)
HEMOGLOBIN: 9 g/dL — AB (ref 12.0–15.0)
LYMPHS PCT: 19 %
Lymphs Abs: 2.2 10*3/uL (ref 0.7–4.0)
MCH: 23.3 pg — ABNORMAL LOW (ref 26.0–34.0)
MCHC: 29.9 g/dL — ABNORMAL LOW (ref 30.0–36.0)
MCV: 78 fL (ref 78.0–100.0)
MONO ABS: 0.7 10*3/uL (ref 0.1–1.0)
Monocytes Relative: 6 %
NEUTROS PCT: 74 %
Neutro Abs: 8.8 10*3/uL — ABNORMAL HIGH (ref 1.7–7.7)
PLATELETS: 237 10*3/uL (ref 150–400)
RBC: 3.86 MIL/uL — ABNORMAL LOW (ref 3.87–5.11)
RDW: 21.9 % — ABNORMAL HIGH (ref 11.5–15.5)
WBC: 11.8 10*3/uL — AB (ref 4.0–10.5)

## 2017-08-10 LAB — BASIC METABOLIC PANEL
Anion gap: 9 (ref 5–15)
BUN: 19 mg/dL (ref 6–20)
CO2: 29 mmol/L (ref 22–32)
CREATININE: 1.04 mg/dL — AB (ref 0.44–1.00)
Calcium: 8.9 mg/dL (ref 8.9–10.3)
Chloride: 103 mmol/L (ref 101–111)
GFR, EST AFRICAN AMERICAN: 53 mL/min — AB (ref >60)
GFR, EST NON AFRICAN AMERICAN: 46 mL/min — AB (ref >60)
Glucose, Bld: 158 mg/dL — ABNORMAL HIGH (ref 65–99)
POTASSIUM: 4.1 mmol/L (ref 3.5–5.1)
SODIUM: 141 mmol/L (ref 135–145)

## 2017-08-10 LAB — CBG MONITORING, ED: GLUCOSE-CAPILLARY: 137 mg/dL — AB (ref 65–99)

## 2017-08-10 LAB — TROPONIN I: TROPONIN I: 0.03 ng/mL — AB (ref <0.03)

## 2017-08-10 MED ORDER — ACETAMINOPHEN 325 MG PO TABS
650.0000 mg | ORAL_TABLET | Freq: Once | ORAL | Status: AC
  Administered 2017-08-10: 650 mg via ORAL
  Filled 2017-08-10: qty 2

## 2017-08-10 MED ORDER — LIDOCAINE 4 % EX PTCH: 1.0000 | MEDICATED_PATCH | Freq: Once | CUTANEOUS | 0 refills | Status: AC

## 2017-08-10 MED ORDER — ACETAMINOPHEN ER 650 MG PO TBCR: 650.0000 mg | EXTENDED_RELEASE_TABLET | Freq: Three times a day (TID) | ORAL | 0 refills | Status: DC | PRN

## 2017-08-10 NOTE — Discharge Instructions (Signed)
We saw you in the ER after you had a fall. All the results in the ER are normal, labs and imaging. We are not sure what is causing your symptoms. The workup in the ER is not complete, and is limited to screening for life threatening and emergent conditions only, so please see a primary care doctor for further evaluation.  We also recommend that you see a neurologist for further evaluation of your balance. Also we have counseled the our social work to make sure that the Education officer, museum from your home care facility comes and visits you and discuss this the next step required.  If you start feeling unsteady, pain gets worse please return to the ER.

## 2017-08-10 NOTE — ED Notes (Signed)
Bed: GA48 Expected date:  Expected time:  Means of arrival:  Comments: EMS- 83yo F, fall/low back pain

## 2017-08-10 NOTE — ED Triage Notes (Signed)
Pt arrived via [facility] after an unwitnessed fall this morning when she tripped over her oxygen cord and fell backwards. Pt did not hit her head, denies LOC. She is not on blood thinners.

## 2017-08-10 NOTE — Care Management Note (Signed)
Case Management Note  Patient Details  Name: Candice Miller MRN: [id number] Date of Birth: February 14, 1925  CM consulted for increase in [facility] services with the addition of [place] [facility] to [facility] RN and PT.  Pt lives with daughter and son-in-law per Dr. Kathrynn Humble and reports they are going to begin preparing for her to move into a LTC facility.  CM spoke with pt's daughter and son-in-law at bedside.  Pt was present with intermittent sleeping and did not participate in the conversation.  CM and family discussed private pay SNF in a custodial pt, transition to Medicaid, and the process to help get the pt into a SNF now while working out a LTC payor source.  Pt's daughter would like her to go to [facility] where she has been previously and is also only 10 minutes from the daughter house.  She knows Lexine Baton with admission there and will contact her, work with the [place], and Dr. Harrington Challenger, pt's PCP, to get pt into the facility.  Daughter and son-in-law had all questions answered and were thankful for the information.  CM contacted Dian Situ with [facility] to advised of the increase in services and need for placement.  No further CM needs noted at this time.  Expected Discharge Date:   08/10/2017               Expected Discharge Plan:  [place]  Discharge planning Services  CM Consult  Post Acute Care Choice:  Home Health Choice offered to:  Adult Children  HH Arranged:  RN, PT, Social Work [employer] Agency:  [facility]  Status of Service:  Completed, signed off  Nowell Sites, Benjaman Lobe, RN 08/10/2017, 11:50 AM

## 2017-08-10 NOTE — ED Notes (Addendum)
Pt attempted to ambulate. Pt was very unsteady. Pt given bedside toilet

## 2017-08-10 NOTE — ED Notes (Addendum)
[facility] notified of need for transport. 

## 2017-08-10 NOTE — ED Provider Notes (Addendum)
[facility] DEPT Provider Note   CSN: [id number] Arrival date & time: 08/10/17  0746     History   Chief Complaint Chief Complaint  Patient presents with  . Fall  . Back Pain    HPI Candice Miller is a 84 y.o. female.  HPI 84 year old female with history of NPH, diabetes, COPD on oxygen comes in with chief complaint of fall. Patient reports that she tripped over her oxygen cord and fell backwards.  Patient is complaining of back pain and right-sided hip pain.  She was unable to get up and had to call EMS for help.  Patient is not on any blood thinners.  She denies severe headaches, numbness, tingling, vision changes.  Past Medical History:  Diagnosis Date  . Anemia   . Breast cancer ([place]) 1994  . COPD (chronic obstructive pulmonary disease) ([place])   . Diabetes mellitus without complication ([place])    diet controlled vs. on glimepride  . Hypertension   . NPH (normal pressure hydrocephalus)   . Osteoporosis     Patient Active Problem List   Diagnosis Date Noted  . Acute respiratory failure with hypoxia ([employer]) 06/20/2017  . Accident due to mechanical fall without injury 06/20/2017  . Dementia without behavioral disturbance 06/20/2017  . Confusion 06/20/2017  . NPH (normal pressure hydrocephalus) 06/20/2017  . Hyperlipidemia associated with type 2 diabetes mellitus ([place]) 06/20/2017  . Type 1 diabetes mellitus with diabetic neuropathy, unspecified ([place]) 06/20/2017  . GERD (gastroesophageal reflux disease) 06/20/2017  . Nodule of middle lobe of right lung 06/20/2017  . COPD with acute exacerbation ([place]) 06/15/2017  . CKD (chronic kidney disease) stage 3, GFR 30-59 ml/min ([facility]) 06/15/2017  . Fall at home, initial encounter 06/15/2017  . COPD (chronic obstructive pulmonary disease) ([place]) 12/31/2016  . DM (diabetes mellitus) ([place]) 12/31/2016  . History of breast cancer 12/31/2016  . HTN (hypertension) 12/31/2016  . Hemorrhoids 12/31/2016  .  Nodule of left lung 12/31/2016  . Lower GI bleed 12/30/2016  . Memory loss 10/22/2012    Past Surgical History:  Procedure Laterality Date  . CHOLECYSTECTOMY    . COLONOSCOPY WITH PROPOFOL N/A 01/01/2017   Procedure: COLONOSCOPY WITH PROPOFOL;  Surgeon: Laurence Spates, MD;  Location: [facility];  Service: Endoscopy;  Laterality: N/A;  . MASTECTOMY  1994  . TUBAL LIGATION       OB History   None      Home Medications    Prior to Admission medications   Medication Sig Start Date End Date Taking? Authorizing Provider  albuterol (PROVENTIL HFA;VENTOLIN HFA) 108 (90 Base) MCG/ACT inhaler Inhale 2 puffs into the lungs every 6 (six) hours as needed for wheezing.    Yes [provider]  atorvastatin (LIPITOR) 10 MG tablet Take 10 mg by mouth daily.   Yes [provider]  budesonide-formoterol (SYMBICORT) 80-4.5 MCG/ACT inhaler Inhale 2 puffs into the lungs 2 (two) times daily.   Yes [provider]  calcium gluconate 500 MG tablet Take 1 tablet by mouth daily.   Yes [provider]  cholecalciferol (VITAMIN D) 1000 units tablet Take 1,000 Units by mouth daily.   Yes [provider]  diltiazem (CARDIZEM CD) 180 MG 24 hr capsule Take 180 mg by mouth daily.   Yes [provider]  fexofenadine (ALLEGRA) 180 MG tablet Take 180 mg by mouth daily.   Yes [provider]  fluticasone (FLONASE) 50 MCG/ACT nasal spray Place 2 sprays into both nostrils daily.  Yes [provider]  furosemide (LASIX) 40 MG tablet Take 40 mg by mouth daily.   Yes [provider]  gabapentin (NEURONTIN) 300 MG capsule Take 300 mg by mouth at bedtime.    Yes [provider]  glimepiride (AMARYL) 1 MG tablet Take 1 mg by mouth. Only takes this medication if blood sugars>150 per provider   Yes [provider]  ipratropium-albuterol (DUONEB) 0.5-2.5 (3) MG/3ML SOLN Take 3 mLs by nebulization 4 (four) times daily.    Yes  [provider]  IRON PO Take 1 tablet by mouth daily.   Yes [provider]  iron polysaccharides (NIFEREX) 150 MG capsule Take 150 mg by mouth daily.   Yes [provider]  l-methylfolate-B6-B12 (METANX) 3-35-2 MG TABS tablet Take 1 tablet by mouth 2 (two) times daily.    Yes [provider]  lisinopril (PRINIVIL,ZESTRIL) 10 MG tablet Take 10 mg by mouth daily.   Yes [provider]  Magnesium 250 MG TABS Take 250 mg by mouth at bedtime.   Yes [provider]  memantine (NAMENDA) 10 MG tablet Take 10 mg by mouth 2 (two) times daily.   Yes [provider]  Omega-3 Fatty Acids (FISH OIL) 1000 MG CAPS Take 1,000 mg by mouth daily.   Yes [provider]  ranitidine (ZANTAC) 150 MG tablet Take 150 mg by mouth 2 (two) times daily.   Yes [provider]  sucralfate (CARAFATE) 1 g tablet Take 1 g by mouth 2 (two) times daily.   Yes [provider]  acetaminophen (TYLENOL 8 HOUR) 650 MG CR tablet Take 1 tablet (650 mg total) by mouth every 8 (eight) hours as needed. 08/10/17   Varney Biles, MD  Lidocaine 4 % PTCH Apply 1 patch topically once for 1 dose. 08/10/17 08/10/17  Varney Biles, MD  nitroGLYCERIN (NITROSTAT) 0.4 MG SL tablet Place 0.4 mg under the tongue every 5 (five) minutes as needed for chest pain.    [provider]  polyethylene glycol (MIRALAX / GLYCOLAX) packet Take 17 g by mouth daily. Patient not taking: Reported on 08/10/2017 01/01/17   Florencia Reasons, MD    Family History Family History  Problem Relation Age of Onset  . Hypertension Mother   . Diabetes Mother   . Heart failure Mother   . Stroke Mother   . Hypertension Father   . Diabetes Father   . Heart failure Father   . Stroke Father     Social History Social History   Tobacco Use  . Smoking status: Never Smoker  . Smokeless tobacco: Former Systems developer    Types: Snuff, Chew  . Tobacco comment: lived with smokers most of her life;  oral tobacco for 10 years?  Substance Use Topics  . Alcohol use: No  . Drug use: No     Allergies   Keflex [cephalexin]; Nitrofurantoin; Other; and Sulfa antibiotics   Review of Systems Review of Systems  Constitutional: Positive for activity change.  Respiratory: Negative for shortness of breath.   Cardiovascular: Negative for chest pain.  Musculoskeletal: Positive for back pain and gait problem.  Neurological: Positive for headaches. Negative for syncope.  Hematological: Does not bruise/bleed easily.     Physical Exam Updated Vital Signs BP 133/66 (BP Location: Right Arm)   Pulse 64   Temp (!) 97.4 F (36.3 C) (Oral)   Resp 16   Ht 5\' 5"  (1.651 m)   SpO2 99%   BMI 30.62 kg/m  Physical Exam  Constitutional: She is oriented to person, place, and time. She appears well-developed.  HENT:  Head: Normocephalic and atraumatic.  Eyes: EOM are normal.  Neck: Normal range of motion. Neck supple.  No midline c-spine tenderness, pt able to turn head to 45 degrees bilaterally without any pain and able to flex neck to the chest and extend without any pain or neurologic symptoms.   Cardiovascular: Normal rate.  Pulmonary/Chest: Effort normal.  Abdominal: Bowel sounds are normal.  Musculoskeletal: She exhibits tenderness. She exhibits no edema or deformity.  Patient has tenderness over the right hip and lower lumbar spine diffusely. Positive tenderness over the right hip with passive leg raise.  Patient unable to actively lift the leg because of pain.  Head to toe evaluation shows no hematoma, bleeding of the scalp, no facial abrasions, no spine step offs, crepitus of the chest or neck, no tenderness to palpation of the bilateral upper and lower extremities, no gross deformities, no chest tenderness, no pelvic pain.   Neurological: She is alert and oriented to person, place, and time.  Skin: Skin is warm and dry.  Nursing note and vitals reviewed.    ED Treatments /  Results  Labs (all labs ordered are listed, but only abnormal results are displayed) Labs Reviewed  CBC WITH DIFFERENTIAL/PLATELET - Abnormal; Notable for the following components:      Result Value   WBC 11.8 (*)    RBC 3.86 (*)    Hemoglobin 9.0 (*)    HCT 30.1 (*)    MCH 23.3 (*)    MCHC 29.9 (*)    RDW 21.9 (*)    Neutro Abs 8.8 (*)    All other components within normal limits  BASIC METABOLIC PANEL - Abnormal; Notable for the following components:   Glucose, Bld 158 (*)    Creatinine, Ser 1.04 (*)    GFR calc non Af Amer 46 (*)    GFR calc Af Amer 53 (*)    All other components within normal limits  TROPONIN I - Abnormal; Notable for the following components:   Troponin I 0.03 (*)    All other components within normal limits  CBG MONITORING, ED - Abnormal; Notable for the following components:   Glucose-Capillary 137 (*)    All other components within normal limits  URINALYSIS, ROUTINE W REFLEX MICROSCOPIC    EKG EKG Interpretation  Date/Time:  Monday August 10 2017 08:01:09 EDT Ventricular Rate:  61 PR Interval:    QRS Duration: 82 QT Interval:  440 QTC Calculation: 444 R Axis:   18 Text Interpretation:  Sinus rhythm No acute changes No significant change since last tracing Confirmed by Varney Biles [phone]) on 08/10/2017 8:42:30 AM   Radiology Dg Lumbar Spine Complete  Result Date: 08/10/2017 CLINICAL DATA:  Acute low back pain fall at home. EXAM: LUMBAR SPINE - COMPLETE 4+ VIEW COMPARISON:  CT scan of June 15, 2017. FINDINGS: Stable old compression deformities of T12 and L1 vertebral bodies. No acute fracture or spondylolisthesis is noted. Atherosclerosis of abdominal aorta is noted. Mild degenerative disc disease is noted at T12-L1 and L1-2 with anterior osteophyte formation. Diffuse osteopenia is noted. IMPRESSION: No acute abnormality seen in the lumbar spine. Electronically Signed   By: Marijo Conception, M.D.   On: 08/10/2017 09:19   Ct Head Wo  Contrast  Result Date: 08/10/2017 CLINICAL DATA:  Fall.  Patient denies hitting her head. EXAM: CT HEAD WITHOUT CONTRAST TECHNIQUE: Contiguous axial images were  obtained from the base of the skull through the vertex without intravenous contrast. COMPARISON:  None. FINDINGS: Brain: No evidence of acute infarction, hemorrhage, hydrocephalus, extra-axial collection or mass lesion/mass effect. Mild atrophy. 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 Lacunar infarcts in the bilateral basal ganglia. Moderate periventricular and subcortical white matter hypodensities are nonspecific, but favored to reflect chronic microvascular ischemic changes. Vascular: Calcified atherosclerosis at the skullbase. No hyperdense vessel. Skull: Negative for fracture or focal lesion. Sinuses/Orbits: Opacification of the underdeveloped right mastoid air cells. Left mastoid air cells are clear. Frothy secretions within the left sphenoid sinus. No acute orbital abnormality. Other: None. IMPRESSION: 1. No acute intracranial abnormality. Chronic changes as described above. 2. Right mastoid effusion. Electronically Signed   By: Titus Dubin M.D.   On: 08/10/2017 09:35   Dg Hip Unilat W Or Wo Pelvis 2-3 Views Right  Result Date: 08/10/2017 CLINICAL DATA:  Right hip pain after unwitnessed fall at home today EXAM: DG HIP (WITH OR WITHOUT PELVIS) 2-3V RIGHT COMPARISON:  Radiographs of June 15, 2017. FINDINGS: There is no evidence of hip fracture or dislocation. There is no evidence of arthropathy or other focal bone abnormality. IMPRESSION: Normal right hip. Electronically Signed   By: Marijo Conception, M.D.   On: 08/10/2017 09:16    Procedures Procedures (including critical care time)  Medications Ordered in ED Medications  acetaminophen (TYLENOL) tablet 650 mg (650 mg Oral Given 08/10/17 0867)     Initial Impression / Assessment and Plan / ED Course  I have reviewed the triage vital signs and the nursing notes.  Pertinent labs &  imaging results that were available during my care of the patient were reviewed by me and considered in my medical decision making (see chart for details).  Clinical Course as of Aug 10 1253  Mon Aug 10, 2017  0919 I reviewed the x-ray and it does not seem like there is any hip fracture.  Ambulation ordered  DG Hip Unilat W or Wo Pelvis 2-3 Views Right [AN]  [id number] Family at bedside.  They report that patient already has home PT and home nursing available.  They have a bedside commode as well.  However they have noted that patient's gait has gotten worse over the past few weeks.  Patient used to see a neurologist in [facility], however that was 2 years ago.  Given the diagnosis of NPH, I have advised that they follow-up with the neurologist in [place] now to reassess the disease progression.  They are comfortable taking patient home, but do indicate that maybe she appears little weaker today than usual.  I have added blood work to her work-up because of that concern.  We have also discussed long-term care plans.  They would appreciate if we have social work talk to them at home to figure out the options available to the patient.   [AN]    Clinical Course User Index [AN] Varney Biles, MD   DDx includes: - Mechanical falls - ICH - Fractures - Contusions - Soft tissue injury  84 year old female comes in with chief complaint of mechanical fall.  Patient has history of ataxia.  We could not clear her brain clinically given the age.  C-spine was cleared clinically. Based on the exam we will order a right-sided hip and lumbar spine pictures.  If the imaging is negative we will discharge the patient.   12:55 PM Face-to-face with Education officer, museum order put in. Family again made aware of the plan.  They will try to set up an appointment with neurology as soon as possible.  Strict ER return precautions have been discussed.  We also emphasized the need for the family to ensure patient's risk  of falling at home is minimized with optimizing her home set up, which includes bedside commode, easy access to water and ability to call for help if needed. Final Clinical Impressions(s) / ED Diagnoses   Final diagnoses:  Fall, initial encounter  Multiple contusions  Ataxia    ED Discharge Orders        Ordered    Lidocaine 4 % PTCH   Once     08/10/17 1253    acetaminophen (TYLENOL 8 HOUR) 650 MG CR tablet  Every 8 hours PRN     08/10/17 1253       Varney Biles, MD 08/10/17 0920    Varney Biles, MD 08/10/17 1255

## 2017-08-10 NOTE — ED Notes (Signed)
Patient transported to CT 

## 2017-08-12 ENCOUNTER — Emergency Department (HOSPITAL_COMMUNITY): Payer: Medicare Other

## 2017-08-12 ENCOUNTER — Inpatient Hospital Stay (HOSPITAL_COMMUNITY)
Admission: EM | Admit: 2017-08-12 | Discharge: 2017-08-17 | DRG: 190 | Disposition: A | Discharge status: Skilled Nursing Facility | Payer: Medicare Other | Attending: Internal Medicine | Admitting: Internal Medicine

## 2017-08-12 ENCOUNTER — Encounter (HOSPITAL_COMMUNITY): Payer: Self-pay | Admitting: Internal Medicine

## 2017-08-12 ENCOUNTER — Other Ambulatory Visit: Payer: Self-pay

## 2017-08-12 DIAGNOSIS — E119 Type 2 diabetes mellitus without complications: Secondary | ICD-10-CM | POA: Diagnosis not present

## 2017-08-12 DIAGNOSIS — Z743 Need for continuous supervision: Secondary | ICD-10-CM | POA: Diagnosis not present

## 2017-08-12 DIAGNOSIS — N183 Chronic kidney disease, stage 3 unspecified: Secondary | ICD-10-CM | POA: Diagnosis present

## 2017-08-12 DIAGNOSIS — G8929 Other chronic pain: Secondary | ICD-10-CM | POA: Diagnosis present

## 2017-08-12 DIAGNOSIS — I471 Supraventricular tachycardia: Secondary | ICD-10-CM | POA: Diagnosis not present

## 2017-08-12 DIAGNOSIS — G912 (Idiopathic) normal pressure hydrocephalus: Secondary | ICD-10-CM | POA: Diagnosis present

## 2017-08-12 DIAGNOSIS — E1169 Type 2 diabetes mellitus with other specified complication: Secondary | ICD-10-CM | POA: Diagnosis present

## 2017-08-12 DIAGNOSIS — E1122 Type 2 diabetes mellitus with diabetic chronic kidney disease: Secondary | ICD-10-CM | POA: Diagnosis present

## 2017-08-12 DIAGNOSIS — K219 Gastro-esophageal reflux disease without esophagitis: Secondary | ICD-10-CM | POA: Diagnosis present

## 2017-08-12 DIAGNOSIS — Z9181 History of falling: Secondary | ICD-10-CM | POA: Diagnosis not present

## 2017-08-12 DIAGNOSIS — G4710 Hypersomnia, unspecified: Secondary | ICD-10-CM | POA: Diagnosis present

## 2017-08-12 DIAGNOSIS — Z7189 Other specified counseling: Secondary | ICD-10-CM | POA: Diagnosis not present

## 2017-08-12 DIAGNOSIS — R488 Other symbolic dysfunctions: Secondary | ICD-10-CM | POA: Diagnosis not present

## 2017-08-12 DIAGNOSIS — R531 Weakness: Secondary | ICD-10-CM | POA: Diagnosis not present

## 2017-08-12 DIAGNOSIS — M6281 Muscle weakness (generalized): Secondary | ICD-10-CM | POA: Diagnosis not present

## 2017-08-12 DIAGNOSIS — W19XXXA Unspecified fall, initial encounter: Secondary | ICD-10-CM | POA: Diagnosis present

## 2017-08-12 DIAGNOSIS — Z515 Encounter for palliative care: Secondary | ICD-10-CM | POA: Diagnosis not present

## 2017-08-12 DIAGNOSIS — J440 Chronic obstructive pulmonary disease with acute lower respiratory infection: Secondary | ICD-10-CM | POA: Diagnosis present

## 2017-08-12 DIAGNOSIS — Z7984 Long term (current) use of oral hypoglycemic drugs: Secondary | ICD-10-CM

## 2017-08-12 DIAGNOSIS — R296 Repeated falls: Secondary | ICD-10-CM | POA: Diagnosis not present

## 2017-08-12 DIAGNOSIS — I10 Essential (primary) hypertension: Secondary | ICD-10-CM | POA: Diagnosis not present

## 2017-08-12 DIAGNOSIS — E118 Type 2 diabetes mellitus with unspecified complications: Secondary | ICD-10-CM

## 2017-08-12 DIAGNOSIS — R2681 Unsteadiness on feet: Secondary | ICD-10-CM | POA: Diagnosis not present

## 2017-08-12 DIAGNOSIS — Z853 Personal history of malignant neoplasm of breast: Secondary | ICD-10-CM

## 2017-08-12 DIAGNOSIS — R0602 Shortness of breath: Secondary | ICD-10-CM | POA: Diagnosis not present

## 2017-08-12 DIAGNOSIS — R0789 Other chest pain: Secondary | ICD-10-CM | POA: Diagnosis not present

## 2017-08-12 DIAGNOSIS — M810 Age-related osteoporosis without current pathological fracture: Secondary | ICD-10-CM | POA: Diagnosis present

## 2017-08-12 DIAGNOSIS — R339 Retention of urine, unspecified: Secondary | ICD-10-CM | POA: Diagnosis not present

## 2017-08-12 DIAGNOSIS — Z8249 Family history of ischemic heart disease and other diseases of the circulatory system: Secondary | ICD-10-CM

## 2017-08-12 DIAGNOSIS — L89611 Pressure ulcer of right heel, stage 1: Secondary | ICD-10-CM | POA: Diagnosis present

## 2017-08-12 DIAGNOSIS — Z66 Do not resuscitate: Secondary | ICD-10-CM | POA: Diagnosis present

## 2017-08-12 DIAGNOSIS — J9620 Acute and chronic respiratory failure, unspecified whether with hypoxia or hypercapnia: Secondary | ICD-10-CM | POA: Diagnosis present

## 2017-08-12 DIAGNOSIS — J189 Pneumonia, unspecified organism: Secondary | ICD-10-CM | POA: Diagnosis present

## 2017-08-12 DIAGNOSIS — J181 Lobar pneumonia, unspecified organism: Secondary | ICD-10-CM | POA: Diagnosis not present

## 2017-08-12 DIAGNOSIS — I129 Hypertensive chronic kidney disease with stage 1 through stage 4 chronic kidney disease, or unspecified chronic kidney disease: Secondary | ICD-10-CM | POA: Diagnosis present

## 2017-08-12 DIAGNOSIS — K5900 Constipation, unspecified: Secondary | ICD-10-CM | POA: Diagnosis not present

## 2017-08-12 DIAGNOSIS — Z7951 Long term (current) use of inhaled steroids: Secondary | ICD-10-CM

## 2017-08-12 DIAGNOSIS — M4855XA Collapsed vertebra, not elsewhere classified, thoracolumbar region, initial encounter for fracture: Secondary | ICD-10-CM | POA: Diagnosis present

## 2017-08-12 DIAGNOSIS — Y92009 Unspecified place in unspecified non-institutional (private) residence as the place of occurrence of the external cause: Secondary | ICD-10-CM

## 2017-08-12 DIAGNOSIS — F0280 Dementia in other diseases classified elsewhere without behavioral disturbance: Secondary | ICD-10-CM | POA: Diagnosis not present

## 2017-08-12 DIAGNOSIS — E11628 Type 2 diabetes mellitus with other skin complications: Secondary | ICD-10-CM | POA: Diagnosis not present

## 2017-08-12 DIAGNOSIS — I872 Venous insufficiency (chronic) (peripheral): Secondary | ICD-10-CM | POA: Diagnosis not present

## 2017-08-12 DIAGNOSIS — J441 Chronic obstructive pulmonary disease with (acute) exacerbation: Secondary | ICD-10-CM | POA: Diagnosis not present

## 2017-08-12 DIAGNOSIS — L89621 Pressure ulcer of left heel, stage 1: Secondary | ICD-10-CM | POA: Diagnosis present

## 2017-08-12 DIAGNOSIS — R279 Unspecified lack of coordination: Secondary | ICD-10-CM | POA: Diagnosis not present

## 2017-08-12 DIAGNOSIS — D631 Anemia in chronic kidney disease: Secondary | ICD-10-CM | POA: Diagnosis present

## 2017-08-12 DIAGNOSIS — F0390 Unspecified dementia without behavioral disturbance: Secondary | ICD-10-CM | POA: Diagnosis present

## 2017-08-12 DIAGNOSIS — Z881 Allergy status to other antibiotic agents status: Secondary | ICD-10-CM

## 2017-08-12 DIAGNOSIS — E785 Hyperlipidemia, unspecified: Secondary | ICD-10-CM | POA: Diagnosis present

## 2017-08-12 DIAGNOSIS — R509 Fever, unspecified: Secondary | ICD-10-CM | POA: Diagnosis not present

## 2017-08-12 DIAGNOSIS — R338 Other retention of urine: Secondary | ICD-10-CM

## 2017-08-12 DIAGNOSIS — Z9981 Dependence on supplemental oxygen: Secondary | ICD-10-CM

## 2017-08-12 DIAGNOSIS — G301 Alzheimer's disease with late onset: Secondary | ICD-10-CM | POA: Diagnosis not present

## 2017-08-12 DIAGNOSIS — M549 Dorsalgia, unspecified: Secondary | ICD-10-CM

## 2017-08-12 DIAGNOSIS — Z833 Family history of diabetes mellitus: Secondary | ICD-10-CM

## 2017-08-12 DIAGNOSIS — L8990 Pressure ulcer of unspecified site, unspecified stage: Secondary | ICD-10-CM

## 2017-08-12 DIAGNOSIS — R918 Other nonspecific abnormal finding of lung field: Secondary | ICD-10-CM | POA: Diagnosis not present

## 2017-08-12 DIAGNOSIS — Z882 Allergy status to sulfonamides status: Secondary | ICD-10-CM

## 2017-08-12 DIAGNOSIS — Z79899 Other long term (current) drug therapy: Secondary | ICD-10-CM

## 2017-08-12 DIAGNOSIS — M545 Low back pain: Secondary | ICD-10-CM | POA: Diagnosis not present

## 2017-08-12 LAB — CBC WITH DIFFERENTIAL/PLATELET
BASOS ABS: 0 10*3/uL (ref 0.0–0.1)
Basophils Relative: 0 %
EOS PCT: 3 %
Eosinophils Absolute: 0.3 10*3/uL (ref 0.0–0.7)
HCT: 32.8 % — ABNORMAL LOW (ref 36.0–46.0)
Hemoglobin: 9.9 g/dL — ABNORMAL LOW (ref 12.0–15.0)
LYMPHS ABS: 2.6 10*3/uL (ref 0.7–4.0)
Lymphocytes Relative: 27 %
MCH: 23.8 pg — ABNORMAL LOW (ref 26.0–34.0)
MCHC: 30.2 g/dL (ref 30.0–36.0)
MCV: 78.8 fL (ref 78.0–100.0)
MONO ABS: 0.8 10*3/uL (ref 0.1–1.0)
Monocytes Relative: 8 %
NEUTROS PCT: 62 %
Neutro Abs: 6.1 10*3/uL (ref 1.7–7.7)
PLATELETS: 239 10*3/uL (ref 150–400)
RBC: 4.16 MIL/uL (ref 3.87–5.11)
RDW: 22 % — AB (ref 11.5–15.5)
WBC: 9.8 10*3/uL (ref 4.0–10.5)

## 2017-08-12 LAB — COMPREHENSIVE METABOLIC PANEL
ALT: 13 U/L — AB (ref 14–54)
AST: 24 U/L (ref 15–41)
Albumin: 3.5 g/dL (ref 3.5–5.0)
Alkaline Phosphatase: 101 U/L (ref 38–126)
Anion gap: 10 (ref 5–15)
BILIRUBIN TOTAL: 0.4 mg/dL (ref 0.3–1.2)
BUN: 21 mg/dL — ABNORMAL HIGH (ref 6–20)
CO2: 30 mmol/L (ref 22–32)
CREATININE: 0.79 mg/dL (ref 0.44–1.00)
Calcium: 9.3 mg/dL (ref 8.9–10.3)
Chloride: 100 mmol/L — ABNORMAL LOW (ref 101–111)
Glucose, Bld: 108 mg/dL — ABNORMAL HIGH (ref 65–99)
POTASSIUM: 4.1 mmol/L (ref 3.5–5.1)
Sodium: 140 mmol/L (ref 135–145)
TOTAL PROTEIN: 7.9 g/dL (ref 6.5–8.1)

## 2017-08-12 LAB — URINALYSIS, ROUTINE W REFLEX MICROSCOPIC
Bilirubin Urine: NEGATIVE
Glucose, UA: 150 mg/dL — AB
HGB URINE DIPSTICK: NEGATIVE
Ketones, ur: NEGATIVE mg/dL
Leukocytes, UA: NEGATIVE
NITRITE: NEGATIVE
PROTEIN: NEGATIVE mg/dL
SPECIFIC GRAVITY, URINE: 1.017 (ref 1.005–1.030)
pH: 7 (ref 5.0–8.0)

## 2017-08-12 LAB — I-STAT CHEM 8, ED
BUN: 20 mg/dL (ref 6–20)
CALCIUM ION: 1.19 mmol/L (ref 1.15–1.40)
Chloride: 98 mmol/L — ABNORMAL LOW (ref 101–111)
Creatinine, Ser: 0.8 mg/dL (ref 0.44–1.00)
Glucose, Bld: 102 mg/dL — ABNORMAL HIGH (ref 65–99)
HCT: 33 % — ABNORMAL LOW (ref 36.0–46.0)
Hemoglobin: 11.2 g/dL — ABNORMAL LOW (ref 12.0–15.0)
Potassium: 4 mmol/L (ref 3.5–5.1)
SODIUM: 139 mmol/L (ref 135–145)
TCO2: 32 mmol/L (ref 22–32)

## 2017-08-12 LAB — PHOSPHORUS: PHOSPHORUS: 3.3 mg/dL (ref 2.5–4.6)

## 2017-08-12 LAB — I-STAT TROPONIN, ED: Troponin i, poc: 0.01 ng/mL (ref 0.00–0.08)

## 2017-08-12 LAB — MAGNESIUM: Magnesium: 2 mg/dL (ref 1.7–2.4)

## 2017-08-12 LAB — BRAIN NATRIURETIC PEPTIDE: B Natriuretic Peptide: 43.4 pg/mL (ref 0.0–100.0)

## 2017-08-12 LAB — GLUCOSE, CAPILLARY: GLUCOSE-CAPILLARY: 123 mg/dL — AB (ref 65–99)

## 2017-08-12 LAB — I-STAT CG4 LACTIC ACID, ED: LACTIC ACID, VENOUS: 0.91 mmol/L (ref 0.5–1.9)

## 2017-08-12 MED ORDER — LEVOFLOXACIN IN D5W 750 MG/150ML IV SOLN: 750.0000 mg | INTRAVENOUS | Status: DC

## 2017-08-12 MED ORDER — LISINOPRIL 10 MG PO TABS
10.0000 mg | ORAL_TABLET | Freq: Every day | ORAL | Status: DC
  Administered 2017-08-13 – 2017-08-17 (×5): 10 mg via ORAL
  Filled 2017-08-12 (×5): qty 1

## 2017-08-12 MED ORDER — LEVOFLOXACIN IN D5W 750 MG/150ML IV SOLN
750.0000 mg | Freq: Once | INTRAVENOUS | Status: AC
  Administered 2017-08-12: 750 mg via INTRAVENOUS
  Filled 2017-08-12: qty 150

## 2017-08-12 MED ORDER — CALCIUM CARBONATE 1250 (500 CA) MG PO TABS
500.0000 mg | ORAL_TABLET | Freq: Every day | ORAL | Status: DC
  Administered 2017-08-13 – 2017-08-17 (×5): 500 mg via ORAL
  Filled 2017-08-12 (×5): qty 1

## 2017-08-12 MED ORDER — ALBUTEROL SULFATE (2.5 MG/3ML) 0.083% IN NEBU: 2.5000 mg | INHALATION_SOLUTION | Freq: Four times a day (QID) | RESPIRATORY_TRACT | Status: DC

## 2017-08-12 MED ORDER — INSULIN ASPART 100 UNIT/ML ~~LOC~~ SOLN
0.0000 [IU] | Freq: Three times a day (TID) | SUBCUTANEOUS | Status: DC
  Administered 2017-08-13 (×2): 3 [IU] via SUBCUTANEOUS
  Administered 2017-08-13: 7 [IU] via SUBCUTANEOUS
  Administered 2017-08-14 (×2): 2 [IU] via SUBCUTANEOUS
  Administered 2017-08-14: 1 [IU] via SUBCUTANEOUS
  Administered 2017-08-15: 3 [IU] via SUBCUTANEOUS
  Administered 2017-08-15: 2 [IU] via SUBCUTANEOUS
  Administered 2017-08-16: 3 [IU] via SUBCUTANEOUS
  Administered 2017-08-16: 2 [IU] via SUBCUTANEOUS
  Administered 2017-08-16: 1 [IU] via SUBCUTANEOUS
  Administered 2017-08-17: 2 [IU] via SUBCUTANEOUS

## 2017-08-12 MED ORDER — LEVOFLOXACIN IN D5W 750 MG/150ML IV SOLN
750.0000 mg | INTRAVENOUS | Status: DC
  Administered 2017-08-14: 750 mg via INTRAVENOUS
  Filled 2017-08-12: qty 150

## 2017-08-12 MED ORDER — SUCRALFATE 1 G PO TABS
1.0000 g | ORAL_TABLET | Freq: Two times a day (BID) | ORAL | Status: DC
  Administered 2017-08-13 – 2017-08-17 (×10): 1 g via ORAL
  Filled 2017-08-12 (×10): qty 1

## 2017-08-12 MED ORDER — DILTIAZEM HCL ER COATED BEADS 180 MG PO CP24
180.0000 mg | ORAL_CAPSULE | Freq: Every day | ORAL | Status: DC
  Administered 2017-08-13 – 2017-08-17 (×5): 180 mg via ORAL
  Filled 2017-08-12 (×5): qty 1

## 2017-08-12 MED ORDER — MEMANTINE HCL 10 MG PO TABS
10.0000 mg | ORAL_TABLET | Freq: Two times a day (BID) | ORAL | Status: DC
  Administered 2017-08-13 – 2017-08-17 (×10): 10 mg via ORAL
  Filled 2017-08-12 (×10): qty 1

## 2017-08-12 MED ORDER — LORATADINE 10 MG PO TABS
10.0000 mg | ORAL_TABLET | Freq: Every day | ORAL | Status: DC
  Administered 2017-08-13 – 2017-08-17 (×5): 10 mg via ORAL
  Filled 2017-08-12 (×5): qty 1

## 2017-08-12 MED ORDER — MAGNESIUM OXIDE 400 (241.3 MG) MG PO TABS
200.0000 mg | ORAL_TABLET | Freq: Every day | ORAL | Status: DC
  Administered 2017-08-13 – 2017-08-16 (×5): 200 mg via ORAL
  Filled 2017-08-12 (×5): qty 1

## 2017-08-12 MED ORDER — IPRATROPIUM-ALBUTEROL 0.5-2.5 (3) MG/3ML IN SOLN
3.0000 mL | Freq: Four times a day (QID) | RESPIRATORY_TRACT | Status: DC
  Administered 2017-08-12: 3 mL via RESPIRATORY_TRACT
  Filled 2017-08-12: qty 3

## 2017-08-12 MED ORDER — FAMOTIDINE 20 MG PO TABS
20.0000 mg | ORAL_TABLET | Freq: Two times a day (BID) | ORAL | Status: DC
  Administered 2017-08-13 – 2017-08-17 (×9): 20 mg via ORAL
  Filled 2017-08-12 (×9): qty 1

## 2017-08-12 MED ORDER — POLYSACCHARIDE IRON COMPLEX 150 MG PO CAPS
150.0000 mg | ORAL_CAPSULE | Freq: Every day | ORAL | Status: DC
  Administered 2017-08-13 – 2017-08-17 (×5): 150 mg via ORAL
  Filled 2017-08-12 (×5): qty 1

## 2017-08-12 MED ORDER — ONDANSETRON HCL 4 MG PO TABS: 4.0000 mg | ORAL_TABLET | Freq: Four times a day (QID) | ORAL | Status: DC | PRN

## 2017-08-12 MED ORDER — ATORVASTATIN CALCIUM 10 MG PO TABS
10.0000 mg | ORAL_TABLET | Freq: Every day | ORAL | Status: DC
  Administered 2017-08-13 – 2017-08-16 (×4): 10 mg via ORAL
  Filled 2017-08-12 (×4): qty 1

## 2017-08-12 MED ORDER — NITROGLYCERIN 0.4 MG SL SUBL: 0.4000 mg | SUBLINGUAL_TABLET | SUBLINGUAL | Status: DC | PRN

## 2017-08-12 MED ORDER — ALBUTEROL SULFATE (2.5 MG/3ML) 0.083% IN NEBU: 2.5000 mg | INHALATION_SOLUTION | RESPIRATORY_TRACT | Status: DC | PRN

## 2017-08-12 MED ORDER — VITAMIN D3 25 MCG (1000 UNIT) PO TABS
1000.0000 [IU] | ORAL_TABLET | Freq: Every day | ORAL | Status: DC
  Administered 2017-08-13 – 2017-08-17 (×5): 1000 [IU] via ORAL
  Filled 2017-08-12 (×5): qty 1

## 2017-08-12 MED ORDER — ACETAMINOPHEN 650 MG RE SUPP: 650.0000 mg | Freq: Four times a day (QID) | RECTAL | Status: DC | PRN

## 2017-08-12 MED ORDER — ONDANSETRON HCL 4 MG/2ML IJ SOLN: 4.0000 mg | Freq: Four times a day (QID) | INTRAMUSCULAR | Status: DC | PRN

## 2017-08-12 MED ORDER — L-METHYLFOLATE-B6-B12 3-35-2 MG PO TABS
1.0000 | ORAL_TABLET | Freq: Two times a day (BID) | ORAL | Status: DC
  Administered 2017-08-13 – 2017-08-17 (×10): 1 via ORAL
  Filled 2017-08-12 (×10): qty 1

## 2017-08-12 MED ORDER — ACETAMINOPHEN 325 MG PO TABS
650.0000 mg | ORAL_TABLET | Freq: Four times a day (QID) | ORAL | Status: DC | PRN
  Administered 2017-08-12 – 2017-08-14 (×2): 650 mg via ORAL
  Filled 2017-08-12 (×2): qty 2

## 2017-08-12 MED ORDER — GABAPENTIN 300 MG PO CAPS
300.0000 mg | ORAL_CAPSULE | Freq: Every day | ORAL | Status: DC
  Administered 2017-08-13 – 2017-08-16 (×5): 300 mg via ORAL
  Filled 2017-08-12 (×5): qty 1

## 2017-08-12 MED ORDER — IPRATROPIUM BROMIDE 0.02 % IN SOLN: 0.5000 mg | Freq: Four times a day (QID) | RESPIRATORY_TRACT | Status: DC

## 2017-08-12 MED ORDER — ENOXAPARIN SODIUM 40 MG/0.4ML ~~LOC~~ SOLN
40.0000 mg | SUBCUTANEOUS | Status: DC
Start: 2017-08-12 — End: 2017-08-17
  Administered 2017-08-12 – 2017-08-16 (×5): 40 mg via SUBCUTANEOUS
  Filled 2017-08-12 (×5): qty 0.4

## 2017-08-12 MED ORDER — METHYLPREDNISOLONE SODIUM SUCC 40 MG IJ SOLR
40.0000 mg | Freq: Once | INTRAMUSCULAR | Status: AC
  Administered 2017-08-13: 40 mg via INTRAVENOUS
  Filled 2017-08-12: qty 1

## 2017-08-12 MED ORDER — GLIMEPIRIDE 1 MG PO TABS
1.0000 mg | ORAL_TABLET | Freq: Every day | ORAL | Status: DC
  Administered 2017-08-13 – 2017-08-17 (×4): 1 mg via ORAL
  Filled 2017-08-12 (×5): qty 1

## 2017-08-12 MED ORDER — GUAIFENESIN ER 600 MG PO TB12
600.0000 mg | ORAL_TABLET | Freq: Two times a day (BID) | ORAL | Status: DC
  Administered 2017-08-12 – 2017-08-17 (×10): 600 mg via ORAL
  Filled 2017-08-12 (×10): qty 1

## 2017-08-12 MED ORDER — IPRATROPIUM-ALBUTEROL 0.5-2.5 (3) MG/3ML IN SOLN
3.0000 mL | Freq: Four times a day (QID) | RESPIRATORY_TRACT | Status: DC
  Administered 2017-08-13 – 2017-08-15 (×11): 3 mL via RESPIRATORY_TRACT
  Filled 2017-08-12 (×11): qty 3

## 2017-08-12 NOTE — ED Notes (Signed)
Bed: TM21 Expected date:  Expected time:  Means of arrival:  Comments: 84 y/F with SOB and Fall

## 2017-08-12 NOTE — ED Provider Notes (Signed)
[facility] DEPT Provider Note   CSN: [id number] Arrival date & time: 08/12/17  1800     History   Chief Complaint Chief Complaint  Patient presents with  . Weakness  . Shortness of Breath    HPI Candice Miller is a 84 y.o. female.  84 yo F with a chief complaint of cough weakness and fevers.  Is been going on for the past couple days.  Patient was recently in the emergency department for a fall.  Since then she said she is felt weak and has had worsening trouble breathing.  She feels that she needs to cough but is unable to get anything up.  She has been using her breathing treatments at home with some improvement.  Has a history of COPD.  She has had some pressure to her anterior chest that is worse with coughing or movement or twisting.  She denies nausea or vomiting.  She does have some dysuria  The history is provided by the patient.  Weakness  Primary symptoms include no dizziness. Associated symptoms include shortness of breath and chest pain. Pertinent negatives include no vomiting and no headaches.  Shortness of Breath  Associated symptoms include a fever, cough and chest pain. Pertinent negatives include no headaches, no rhinorrhea, no wheezing and no vomiting.  Illness  This is a new problem. The current episode started 2 days ago. The problem occurs constantly. The problem has been gradually worsening. Associated symptoms include chest pain and shortness of breath. Pertinent negatives include no headaches. Nothing aggravates the symptoms. Nothing relieves the symptoms. She has tried nothing for the symptoms. The treatment provided no relief.    Past Medical History:  Diagnosis Date  . Anemia   . Breast cancer ([place]) 1994  . COPD (chronic obstructive pulmonary disease) ([place])   . Diabetes mellitus without complication ([place])    diet controlled vs. on glimepride  . Hypertension   . NPH (normal pressure hydrocephalus)   . Osteoporosis      Patient Active Problem List   Diagnosis Date Noted  . CAP (community acquired pneumonia) 08/12/2017  . Acute respiratory failure with hypoxia ([place]) 06/20/2017  . Accident due to mechanical fall without injury 06/20/2017  . Dementia without behavioral disturbance 06/20/2017  . Confusion 06/20/2017  . NPH (normal pressure hydrocephalus) 06/20/2017  . Hyperlipidemia associated with type 2 diabetes mellitus ([place]) 06/20/2017  . Type 1 diabetes mellitus with diabetic neuropathy, unspecified ([place]) 06/20/2017  . GERD (gastroesophageal reflux disease) 06/20/2017  . Nodule of middle lobe of right lung 06/20/2017  . COPD with acute exacerbation ([place]) 06/15/2017  . CKD (chronic kidney disease) stage 3, GFR 30-59 ml/min (HCC) 06/15/2017  . Fall at home, initial encounter 06/15/2017  . COPD (chronic obstructive pulmonary disease) ([place]) 12/31/2016  . DM (diabetes mellitus) ([place]) 12/31/2016  . History of breast cancer 12/31/2016  . HTN (hypertension) 12/31/2016  . Hemorrhoids 12/31/2016  . Nodule of left lung 12/31/2016  . Lower GI bleed 12/30/2016  . Memory loss 10/22/2012    Past Surgical History:  Procedure Laterality Date  . CHOLECYSTECTOMY    . COLONOSCOPY WITH PROPOFOL N/A 01/01/2017   Procedure: COLONOSCOPY WITH PROPOFOL;  Surgeon: Laurence Spates, MD;  Location: [facility] ENDOSCOPY;  Service: Endoscopy;  Laterality: N/A;  . MASTECTOMY  1994  . TUBAL LIGATION       OB History   None      Home Medications    Prior to Admission medications   Medication Sig Start  Date End Date Taking? Authorizing Provider  acetaminophen (TYLENOL 8 HOUR) 650 MG CR tablet Take 1 tablet (650 mg total) by mouth every 8 (eight) hours as needed. 08/10/17   Varney Biles, MD  albuterol (PROVENTIL HFA;VENTOLIN HFA) 108 (90 Base) MCG/ACT inhaler Inhale 2 puffs into the lungs every 6 (six) hours as needed for wheezing.     [provider]  atorvastatin (LIPITOR) 10 MG tablet Take 10 mg by mouth daily.     [provider]  budesonide-formoterol (SYMBICORT) 80-4.5 MCG/ACT inhaler Inhale 2 puffs into the lungs 2 (two) times daily.    [provider]  calcium gluconate 500 MG tablet Take 1 tablet by mouth daily.    [provider]  cholecalciferol (VITAMIN D) 1000 units tablet Take 1,000 Units by mouth daily.    [provider]  diltiazem (CARDIZEM CD) 180 MG 24 hr capsule Take 180 mg by mouth daily.    [provider]  fexofenadine (ALLEGRA) 180 MG tablet Take 180 mg by mouth daily.    [provider]  fluticasone (FLONASE) 50 MCG/ACT nasal spray Place 2 sprays into both nostrils daily.    [provider]  furosemide (LASIX) 40 MG tablet Take 40 mg by mouth daily.    [provider]  gabapentin (NEURONTIN) 300 MG capsule Take 300 mg by mouth at bedtime.     [provider]  glimepiride (AMARYL) 1 MG tablet Take 1 mg by mouth. Only takes this medication if blood sugars>150 per provider    [provider]  ipratropium-albuterol (DUONEB) 0.5-2.5 (3) MG/3ML SOLN Take 3 mLs by nebulization 4 (four) times daily.     [provider]  IRON PO Take 1 tablet by mouth daily.    [provider]  iron polysaccharides (NIFEREX) 150 MG capsule Take 150 mg by mouth daily.    [provider]  l-methylfolate-B6-B12 (METANX) 3-35-2 MG TABS tablet Take 1 tablet by mouth 2 (two) times daily.     [provider]  lisinopril (PRINIVIL,ZESTRIL) 10 MG tablet Take 10 mg by mouth daily.    [provider]  Magnesium 250 MG TABS Take 250 mg by mouth at bedtime.    [provider]  memantine (NAMENDA) 10 MG tablet Take 10 mg by mouth 2 (two) times daily.    [provider]  nitroGLYCERIN (NITROSTAT) 0.4 MG SL tablet Place 0.4 mg under the tongue every 5 (five) minutes as needed for chest pain.    [provider]  Omega-3 Fatty Acids (FISH OIL) 1000 MG CAPS Take 1,000 mg  by mouth daily.    [provider]  polyethylene glycol (MIRALAX / GLYCOLAX) packet Take 17 g by mouth daily. Patient not taking: Reported on 08/10/2017 01/01/17   Florencia Reasons, MD  ranitidine (ZANTAC) 150 MG tablet Take 150 mg by mouth 2 (two) times daily.    [provider]  sucralfate (CARAFATE) 1 g tablet Take 1 g by mouth 2 (two) times daily.    [provider]    Family History Family History  Problem Relation Age of Onset  . Hypertension Mother   . Diabetes Mother   . Heart failure Mother   . Stroke Mother   . Hypertension Father   . Diabetes Father   . Heart failure Father   . Stroke Father     Social History Social History   Tobacco Use  . Smoking status: Never Smoker  . Smokeless tobacco: Former Systems developer  Types: Snuff, Chew  . Tobacco comment: lived with smokers most of her life; oral tobacco for 10 years?  Substance Use Topics  . Alcohol use: No  . Drug use: No     Allergies   Keflex [cephalexin]; Nitrofurantoin; Other; and Sulfa antibiotics   Review of Systems Review of Systems  Constitutional: Positive for fever. Negative for chills.  HENT: Positive for congestion. Negative for rhinorrhea.   Eyes: Negative for redness and visual disturbance.  Respiratory: Positive for cough and shortness of breath. Negative for wheezing.   Cardiovascular: Positive for chest pain. Negative for palpitations.  Gastrointestinal: Negative for nausea and vomiting.  Genitourinary: Negative for dysuria and urgency.  Musculoskeletal: Negative for arthralgias and myalgias.  Skin: Negative for pallor and wound.  Neurological: Positive for weakness. Negative for dizziness and headaches.     Physical Exam Updated Vital Signs BP (!) 158/70   Pulse 69   Temp 98.4 F (36.9 C) (Oral)   Resp 20   Ht 5\' 3"  (1.6 m)   Wt 83.5 kg (184 lb)   SpO2 99%   BMI 32.59 kg/m   Physical Exam  Constitutional: She is oriented to person, place, and time. She appears  well-developed and well-nourished. No distress.  HENT:  Head: Normocephalic and atraumatic.  Eyes: Pupils are equal, round, and reactive to light. EOM are normal.  Neck: Normal range of motion. Neck supple.  Cardiovascular: Normal rate and regular rhythm. Exam reveals no gallop and no friction rub.  No murmur heard. Pulmonary/Chest: Effort normal. She has no wheezes. She has rales (bases bilaterally).  Abdominal: Soft. She exhibits no distension. There is no tenderness.  Musculoskeletal: She exhibits no edema or tenderness.  Neurological: She is alert and oriented to person, place, and time.  Skin: Skin is warm and dry. She is not diaphoretic.  Psychiatric: She has a normal mood and affect. Her behavior is normal.  Nursing note and vitals reviewed.    ED Treatments / Results  Labs (all labs ordered are listed, but only abnormal results are displayed) Labs Reviewed  COMPREHENSIVE METABOLIC PANEL - Abnormal; Notable for the following components:      Result Value   Chloride 100 (*)    Glucose, Bld 108 (*)    BUN 21 (*)    ALT 13 (*)    All other components within normal limits  CBC WITH DIFFERENTIAL/PLATELET - Abnormal; Notable for the following components:   Hemoglobin 9.9 (*)    HCT 32.8 (*)    MCH 23.8 (*)    RDW 22.0 (*)    All other components within normal limits  URINALYSIS, ROUTINE W REFLEX MICROSCOPIC - Abnormal; Notable for the following components:   Glucose, UA 150 (*)    All other components within normal limits  I-STAT CHEM 8, ED - Abnormal; Notable for the following components:   Chloride 98 (*)    Glucose, Bld 102 (*)    Hemoglobin 11.2 (*)    HCT 33.0 (*)    All other components within normal limits  CULTURE, BLOOD (ROUTINE X 2)  CULTURE, BLOOD (ROUTINE X 2)  URINE CULTURE  GRAM STAIN  CULTURE, EXPECTORATED SPUTUM-ASSESSMENT  BRAIN NATRIURETIC PEPTIDE  MAGNESIUM  PHOSPHORUS  STREP PNEUMONIAE URINARY ANTIGEN  I-STAT CG4 LACTIC ACID, ED  I-STAT  TROPONIN, ED    EKG None  Radiology Dg Chest 2 View  Result Date: 08/12/2017 CLINICAL DATA:  Loss of appetite, fever EXAM: CHEST - 2 VIEW COMPARISON:  06/17/2017 FINDINGS: Increased  interstitial markings. Mild patchy opacity in the right mid lung, raising the possibility of pneumonia. No pleural effusion or pneumothorax. The heart is normal in size. Degenerative changes of the visualized thoracolumbar spine. Moderate compression fracture of a midthoracic vertebral body. Mild superior endplate compression fracture of a upper lumbar vertebral body. These findings are unchanged. IMPRESSION: Mild patchy opacity in the right mid lung, raising the possibility of pneumonia. Electronically Signed   By: Julian Hy M.D.   On: 08/12/2017 18:43    Procedures Procedures (including critical care time)  Medications Ordered in ED Medications  levofloxacin (LEVAQUIN) IVPB 750 mg (has no administration in time range)  enoxaparin (LOVENOX) injection 40 mg (has no administration in time range)  ondansetron (ZOFRAN) tablet 4 mg (has no administration in time range)    Or  ondansetron (ZOFRAN) injection 4 mg (has no administration in time range)  acetaminophen (TYLENOL) tablet 650 mg (has no administration in time range)    Or  acetaminophen (TYLENOL) suppository 650 mg (has no administration in time range)  albuterol (PROVENTIL) (2.5 MG/3ML) 0.083% nebulizer solution 2.5 mg (has no administration in time range)  ipratropium (ATROVENT) nebulizer solution 0.5 mg (has no administration in time range)  albuterol (PROVENTIL) (2.5 MG/3ML) 0.083% nebulizer solution 2.5 mg (has no administration in time range)  guaiFENesin (MUCINEX) 12 hr tablet 600 mg (has no administration in time range)  levofloxacin (LEVAQUIN) IVPB 750 mg (has no administration in time range)     Initial Impression / Assessment and Plan / ED Course  I have reviewed the triage vital signs and the nursing notes.  Pertinent labs &  imaging results that were available during my care of the patient were reviewed by me and considered in my medical decision making (see chart for details).     84 yo F with a cc of sob.  Going on for past couple days post fall.  Will obtain an infectious workup, cxr, ua, labs, reassess.   Chest x-ray concerning for right upper lobe pneumonia.  Will start on Levaquin with her allergy to cephalosporins.  Discussed with hospitalist will admit.  The patients results and plan were reviewed and discussed.   Any x-rays performed were independently reviewed by myself.   Differential diagnosis were considered with the presenting HPI.  Medications  levofloxacin (LEVAQUIN) IVPB 750 mg (has no administration in time range)  enoxaparin (LOVENOX) injection 40 mg (has no administration in time range)  ondansetron (ZOFRAN) tablet 4 mg (has no administration in time range)    Or  ondansetron (ZOFRAN) injection 4 mg (has no administration in time range)  acetaminophen (TYLENOL) tablet 650 mg (has no administration in time range)    Or  acetaminophen (TYLENOL) suppository 650 mg (has no administration in time range)  albuterol (PROVENTIL) (2.5 MG/3ML) 0.083% nebulizer solution 2.5 mg (has no administration in time range)  ipratropium (ATROVENT) nebulizer solution 0.5 mg (has no administration in time range)  albuterol (PROVENTIL) (2.5 MG/3ML) 0.083% nebulizer solution 2.5 mg (has no administration in time range)  guaiFENesin (MUCINEX) 12 hr tablet 600 mg (has no administration in time range)  levofloxacin (LEVAQUIN) IVPB 750 mg (has no administration in time range)    Vitals:   08/12/17 1834 08/12/17 1900 08/12/17 1930 08/12/17 2002  BP: (!) 136/116 (!) 152/60 (!) 154/63 (!) 158/70  Pulse: 68 71  69  Resp: 18 19 (!) 21 20  Temp:      TempSrc:      SpO2: 100% 100%  99%  Weight:      Height:        Final diagnoses:  Community acquired pneumonia of right upper lobe of lung ([place])    Admission/  observation were discussed with the admitting physician, patient and/or family and they are comfortable with the plan.    Final Clinical Impressions(s) / ED Diagnoses   Final diagnoses:  Community acquired pneumonia of right upper lobe of lung [facility])    ED Discharge Orders    None       Deno Etienne, DO 08/12/17 2010

## 2017-08-12 NOTE — ED Notes (Signed)
ED TO INPATIENT HANDOFF REPORT  Name/Age/Gender Candice Miller 84 y.o. female  Code Status    Code Status Orders  (From admission, onward)        Start     Ordered   08/12/17 2002  Do not attempt resuscitation (DNR)  Continuous    Question Answer Comment  In the event of cardiac or respiratory ARREST Do not call a "code blue"   In the event of cardiac or respiratory ARREST Do not perform Intubation, CPR, defibrillation or ACLS   In the event of cardiac or respiratory ARREST Use medication by any route, position, wound care, and other measures to relive pain and suffering. May use oxygen, suction and manual treatment of airway obstruction as needed for comfort.      08/12/17 2001    Code Status History    Date Active Date Inactive Code Status Order ID Comments User Context   06/15/2017 1135 06/18/2017 2154 DNR [id number]  Karmen Bongo, MD ED   12/31/2016 0045 01/01/2017 2015 Partial Code [id number]  Bethena Roys, MD Inpatient      Home/SNF/Other Home  Chief Complaint Respiratory distress, weakness  Level of Care/Admitting Diagnosis ED Disposition    ED Disposition Condition [facility] Area: [facility] [[id number]]  Level of Care: Telemetry [5]  Admit to tele based on following criteria: Monitor for Ischemic changes  Diagnosis: CAP (community acquired pneumonia) [510258]  Admitting Physician: Reubin Milan [[id number]]  Attending Physician: Reubin Milan [[id number]]  Estimated length of stay: past midnight tomorrow  Certification:: I certify this patient will need inpatient services for at least 2 midnights  PT Class (Do Not Modify): Inpatient [101]  PT Acc Code (Do Not Modify): Private [1]       Medical History Past Medical History:  Diagnosis Date  . Anemia   . Breast cancer ([place]) 1994  . COPD (chronic obstructive pulmonary disease) ([place])   . Diabetes mellitus without complication ([place])    diet controlled vs. on  glimepride  . Hypertension   . NPH (normal pressure hydrocephalus)   . Osteoporosis     Allergies Allergies  Allergen Reactions  . Keflex [Cephalexin]   . Nitrofurantoin   . Other     Pt reports "microdentin"  . Sulfa Antibiotics     IV Location/Drains/Wounds Patient Lines/Drains/Airways Status   Active Line/Drains/Airways    None          Labs/Imaging Results for orders placed or performed during the hospital encounter of 08/12/17 (from the past 48 hour(s))  Comprehensive metabolic panel     Status: Abnormal   Collection Time: 08/12/17  6:51 PM  Result Value Ref Range   Sodium 140 135 - 145 mmol/L   Potassium 4.1 3.5 - 5.1 mmol/L   Chloride 100 (L) 101 - 111 mmol/L   CO2 30 22 - 32 mmol/L   Glucose, Bld 108 (H) 65 - 99 mg/dL   BUN 21 (H) 6 - 20 mg/dL   Creatinine, Ser 0.79 0.44 - 1.00 mg/dL   Calcium 9.3 8.9 - 10.3 mg/dL   Total Protein 7.9 6.5 - 8.1 g/dL   Albumin 3.5 3.5 - 5.0 g/dL   AST 24 15 - 41 U/L   ALT 13 (L) 14 - 54 U/L   Alkaline Phosphatase 101 38 - 126 U/L   Total Bilirubin 0.4 0.3 - 1.2 mg/dL   GFR calc non Af Amer >60 >60 mL/min   GFR calc Af Amer >  60 >60 mL/min    Comment: (NOTE) The eGFR has been calculated using the CKD EPI equation. This calculation has not been validated in all clinical situations. eGFR's persistently <60 mL/min signify possible Chronic Kidney Disease.    Anion gap 10 5 - 15    Comment: Performed at [facility], [place] [address] [place], [place] [address]  CBC WITH DIFFERENTIAL     Status: Abnormal   Collection Time: 08/12/17  6:51 PM  Result Value Ref Range   WBC 9.8 4.0 - 10.5 K/uL   RBC 4.16 3.87 - 5.11 MIL/uL   Hemoglobin 9.9 (L) 12.0 - 15.0 g/dL   HCT 32.8 (L) 36.0 - 46.0 %   MCV 78.8 78.0 - 100.0 fL   MCH 23.8 (L) 26.0 - 34.0 pg   MCHC 30.2 30.0 - 36.0 g/dL   RDW 22.0 (H) 11.5 - 15.5 %   Platelets 239 150 - 400 K/uL   Neutrophils Relative % 62 %   Lymphocytes Relative 27 %   Monocytes  Relative 8 %   Eosinophils Relative 3 %   Basophils Relative 0 %   Neutro Abs 6.1 1.7 - 7.7 K/uL   Lymphs Abs 2.6 0.7 - 4.0 K/uL   Monocytes Absolute 0.8 0.1 - 1.0 K/uL   Eosinophils Absolute 0.3 0.0 - 0.7 K/uL   Basophils Absolute 0.0 0.0 - 0.1 K/uL   Smear Review MORPHOLOGY UNREMARKABLE     Comment: Performed at [facility], [place] [address], [place], [place] [address]  Brain natriuretic peptide     Status: None   Collection Time: 08/12/17  6:51 PM  Result Value Ref Range   B Natriuretic Peptide 43.4 0.0 - 100.0 pg/mL    Comment: Performed at [facility], [place] [address] [place], [place] [address]  I-stat troponin, ED     Status: None   Collection Time: 08/12/17  7:00 PM  Result Value Ref Range   Troponin i, poc 0.01 0.00 - 0.08 ng/mL   Comment 3            Comment: Due to the release kinetics of cTnI, a negative result within the first hours of the onset of symptoms does not rule out myocardial infarction with certainty. If myocardial infarction is still suspected, repeat the test at appropriate intervals.   I-Stat CG4 Lactic Acid, ED  (not at  [facility])     Status: None   Collection Time: 08/12/17  7:02 PM  Result Value Ref Range   Lactic Acid, Venous 0.91 0.5 - 1.9 mmol/L  I-Stat Chem 8, ED     Status: Abnormal   Collection Time: 08/12/17  7:02 PM  Result Value Ref Range   Sodium 139 135 - 145 mmol/L   Potassium 4.0 3.5 - 5.1 mmol/L   Chloride 98 (L) 101 - 111 mmol/L   BUN 20 6 - 20 mg/dL   Creatinine, Ser 0.80 0.44 - 1.00 mg/dL   Glucose, Bld 102 (H) 65 - 99 mg/dL   Calcium, Ion 1.19 1.15 - 1.40 mmol/L   TCO2 32 22 - 32 mmol/L   Hemoglobin 11.2 (L) 12.0 - 15.0 g/dL   HCT 33.0 (L) 36.0 - 46.0 %  Urinalysis, Routine w reflex microscopic (not at [facility])     Status: Abnormal   Collection Time: 08/12/17  7:04 PM  Result Value Ref Range   Color, Urine YELLOW YELLOW   APPearance CLEAR CLEAR   Specific Gravity, Urine 1.017 1.005 -  1.030    pH 7.0 5.0 - 8.0   Glucose, UA 150 (A) NEGATIVE mg/dL   Hgb urine dipstick NEGATIVE NEGATIVE   Bilirubin Urine NEGATIVE NEGATIVE   Ketones, ur NEGATIVE NEGATIVE mg/dL   Protein, ur NEGATIVE NEGATIVE mg/dL   Nitrite NEGATIVE NEGATIVE   Leukocytes, UA NEGATIVE NEGATIVE    Comment: Performed at [facility] [address] [place], [place] [address]   Dg Chest 2 View  Result Date: 08/12/2017 CLINICAL DATA:  Loss of appetite, fever EXAM: CHEST - 2 VIEW COMPARISON:  06/17/2017 FINDINGS: Increased interstitial markings. Mild patchy opacity in the right mid lung, raising the possibility of pneumonia. No pleural effusion or pneumothorax. The heart is normal in size. Degenerative changes of the visualized thoracolumbar spine. Moderate compression fracture of a midthoracic vertebral body. Mild superior endplate compression fracture of a upper lumbar vertebral body. These findings are unchanged. IMPRESSION: Mild patchy opacity in the right mid lung, raising the possibility of pneumonia. Electronically Signed   By: Julian Hy M.D.   On: 08/12/2017 18:43    Pending Labs Unresulted Labs (From admission, onward)   Start     Ordered   08/19/17 0500  Creatinine, serum  (enoxaparin (LOVENOX)    CrCl >/= 30 ml/min)  Weekly,   R    Comments:  while on enoxaparin therapy    08/12/17 2001   08/12/17 2000  Culture, sputum-assessment  Once,   R    Question:  Patient immune status  Answer:  Immunocompromised   08/12/17 2001   08/12/17 1957  Gram stain  Once,   R    Question:  Patient immune status  Answer:  Immunocompromised   08/12/17 2001   08/12/17 1957  Strep pneumoniae urinary antigen  Once,   R     08/12/17 2001   08/12/17 1954  Magnesium  Add-on,   R     08/12/17 1953   08/12/17 1954  Phosphorus  Add-on,   R     08/12/17 1953   08/12/17 1816  Blood Culture (routine x 2)  BLOOD CULTURE X 2,   STAT     08/12/17 1815   08/12/17 1816  Urine culture  STAT,   STAT      08/12/17 1815      Vitals/Pain Today's Vitals   08/12/17 1910 08/12/17 1930 08/12/17 2000 08/12/17 2002  BP:  (!) 154/63 (!) 158/70 (!) 158/70  Pulse:   67 69  Resp:  (!) 21 (!) 23 20  Temp:      TempSrc:      SpO2:   100% 99%  Weight:      Height:      PainSc: 4        Isolation Precautions No active isolations  Medications Medications  levofloxacin (LEVAQUIN) IVPB 750 mg (750 mg Intravenous New Bag/Given 08/12/17 2012)  enoxaparin (LOVENOX) injection 40 mg (has no administration in time range)  ondansetron (ZOFRAN) tablet 4 mg (has no administration in time range)    Or  ondansetron (ZOFRAN) injection 4 mg (has no administration in time range)  acetaminophen (TYLENOL) tablet 650 mg (has no administration in time range)    Or  acetaminophen (TYLENOL) suppository 650 mg (has no administration in time range)  albuterol (PROVENTIL) (2.5 MG/3ML) 0.083% nebulizer solution 2.5 mg (has no administration in time range)  guaiFENesin (MUCINEX) 12 hr tablet 600 mg (has no administration in time range)  levofloxacin (LEVAQUIN) IVPB 750 mg (has no administration in  time range)  ipratropium-albuterol (DUONEB) 0.5-2.5 (3) MG/3ML nebulizer solution 3 mL (has no administration in time range)    Mobility non-ambulatory

## 2017-08-12 NOTE — Progress Notes (Signed)
PHARMACY NOTE:  ANTIMICROBIAL RENAL DOSAGE ADJUSTMENT  Current antimicrobial regimen includes a mismatch between antimicrobial dosage and estimated renal function. As per policy approved by the Pharmacy & Therapeutics and Medical Executive Committees, the antimicrobial dosage will be adjusted accordingly.  Current antimicrobial and dosage:  Levofloxacin 750 mg q24 hr  Indication: CAP  Renal Function:   Estimated Creatinine Clearance: 47.8 mL/min (by C-G formula based on SCr of 0.8 mg/dL). []      On intermittent HD, scheduled: []      On CRRT    Antimicrobial dosage has been changed to:  750 mg q48 hr   Additional Comments: n/a   Thank you for allowing pharmacy to be a part of this patient's care.  Reuel Boom, PharmD, BCPS [phone] 08/12/2017, 9:49 PM

## 2017-08-12 NOTE — H&P (Signed)
4        History and Physical    Candice Miller IRC:[id number] DOB: 1925-01-10 DOA: 08/12/2017  PCP: Lawerance Cruel, MD   Patient coming from: Home.  I have personally briefly reviewed patient's old medical records in [place]  Chief Complaint: Fever and decreased appetite.  HPI: Candice Miller is a 84 y.o. female with medical history significant of anemia, history of breast cancer, COPD, type 2 diabetes, hypertension, normal pressure hydrocephalus, osteoporosis who is brought to the emergency department due to decreased appetite, fever, chills, fatigue, malaise, fatigue, hypersomnolence, productive cough and pleuritic chest pain.  She denies headache, rhinorrhea, sore throat, palpitations, dizziness, diaphoresis, PND, orthopnea or recent pitting edema of the lower extremities.  No nausea, no vomiting, no diarrhea or constipation.  No melena or hematochezia.  She denies dysuria, frequency or hematuria. She had a fall on Monday.  ED Course: Initial vital signs temperature 36.9 C (98.4 F), pulse 80, respirations 16, blood pressure 167/79 mmHg and O2 sat 100% on nasal cannula oxygen.  The patient received 750 mg of Levaquin IVPB.  Her urine analysis show glucosuria 150 mg/dL, but was otherwise negative.  I-STAT troponin and BNP were normal.  White count was 9.8 with 62% neutrophils, 27% lymphocytes and 8.7 monocytes.  Hemoglobin 9.9 g/dL and platelets 239.  CMP shows a sodium of 140, potassium 4.1, chloride 100 and CO2 30 mmol/L.  Glucose is 108, BUN 21 creatinine 0.79, phosphorus 3.3 and magnesium 2.0 mg/dL.  LFTs were unremarkable.Chest radiograph showed a mild patchy opacity in the right midlung, raising the possibility of pneumonia.  Please see images and full radiology report for further detail.  Review of Systems: As per HPI otherwise 10 point review of systems negative.    Past Medical History:  Diagnosis Date  . Anemia   . Breast cancer ([place]) 1994  . COPD (chronic  obstructive pulmonary disease) ([place])   . Diabetes mellitus without complication ([place])    diet controlled vs. on glimepride  . Hypertension   . NPH (normal pressure hydrocephalus)   . Osteoporosis     Past Surgical History:  Procedure Laterality Date  . CHOLECYSTECTOMY    . COLONOSCOPY WITH PROPOFOL N/A 01/01/2017   Procedure: COLONOSCOPY WITH PROPOFOL;  Surgeon: Laurence Spates, MD;  Location: [facility] ENDOSCOPY;  Service: Endoscopy;  Laterality: N/A;  . MASTECTOMY  1994  . TUBAL LIGATION       reports that she has never smoked. She has quit using smokeless tobacco. Her smokeless tobacco use included snuff and chew. She reports that she does not drink alcohol or use drugs.  Allergies  Allergen Reactions  . Keflex [Cephalexin]   . Nitrofurantoin   . Other     Pt reports "microdentin"  . Sulfa Antibiotics     Family History  Problem Relation Age of Onset  . Hypertension Mother   . Diabetes Mother   . Heart failure Mother   . Stroke Mother   . Hypertension Father   . Diabetes Father   . Heart failure Father   . Stroke Father    Prior to Admission medications   Medication Sig Start Date End Date Taking? Authorizing Provider  acetaminophen (TYLENOL 8 HOUR) 650 MG CR tablet Take 1 tablet (650 mg total) by mouth every 8 (eight) hours as needed. Patient taking differently: Take 650 mg by mouth every 8 (eight) hours as needed for pain.  08/10/17  Yes Varney Biles, MD  atorvastatin (LIPITOR) 10 MG tablet Take  10 mg by mouth daily.   Yes [provider]  budesonide-formoterol (SYMBICORT) 80-4.5 MCG/ACT inhaler Inhale 2 puffs into the lungs 2 (two) times daily.   Yes [provider]  calcium gluconate 500 MG tablet Take 1 tablet by mouth daily.   Yes [provider]  cholecalciferol (VITAMIN D) 1000 units tablet Take 1,000 Units by mouth daily.   Yes [provider]  diltiazem (CARDIZEM CD) 180 MG 24 hr capsule Take 180 mg by mouth daily.   Yes  [provider]  fexofenadine (ALLEGRA) 180 MG tablet Take 180 mg by mouth daily.   Yes [provider]  fluticasone (FLONASE) 50 MCG/ACT nasal spray Place 2 sprays into both nostrils daily.   Yes [provider]  gabapentin (NEURONTIN) 300 MG capsule Take 300 mg by mouth at bedtime.    Yes [provider]  glimepiride (AMARYL) 1 MG tablet Take 1 mg by mouth. Only takes this medication if blood sugars>150 per provider   Yes [provider]  ipratropium-albuterol (DUONEB) 0.5-2.5 (3) MG/3ML SOLN Take 3 mLs by nebulization 4 (four) times daily.    Yes [provider]  iron polysaccharides (NIFEREX) 150 MG capsule Take 150 mg by mouth daily.   Yes [provider]  l-methylfolate-B6-B12 (METANX) 3-35-2 MG TABS tablet Take 1 tablet by mouth 2 (two) times daily.    Yes [provider]  lisinopril (PRINIVIL,ZESTRIL) 10 MG tablet Take 10 mg by mouth daily.   Yes [provider]  Magnesium 250 MG TABS Take 250 mg by mouth at bedtime.   Yes [provider]  memantine (NAMENDA) 10 MG tablet Take 10 mg by mouth 2 (two) times daily.   Yes [provider]  Omega-3 Fatty Acids (FISH OIL) 1000 MG CAPS Take 1,000 mg by mouth daily.   Yes [provider]  ranitidine (ZANTAC) 150 MG tablet Take 150 mg by mouth 2 (two) times daily.   Yes [provider]  sucralfate (CARAFATE) 1 g tablet Take 1 g by mouth 2 (two) times daily.   Yes [provider]  albuterol (PROVENTIL HFA;VENTOLIN HFA) 108 (90 Base) MCG/ACT inhaler Inhale 2 puffs into the lungs every 6 (six) hours as needed for wheezing.     [provider]  nitroGLYCERIN (NITROSTAT) 0.4 MG SL tablet Place 0.4 mg under the tongue every 5 (five) minutes as needed for chest pain.    [provider]  polyethylene glycol (MIRALAX / GLYCOLAX) packet Take 17 g by mouth daily. Patient not taking: Reported on 08/10/2017 01/01/17   Florencia Reasons, MD    Physical Exam: Vitals:   08/12/17 2030 08/12/17 2132 08/12/17 2156 08/12/17 2209  BP: (!) 151/64 (!) 151/61    Pulse: 66 72    Resp: (!) 22 (!) 22    Temp:  98.2 F (36.8 C)    TempSrc:  Oral    SpO2: 100% 97%  92%  Weight:   84.2 kg (185 lb 10 oz)   Height:   5\' 1"  (1.549 m)     Constitutional: NAD, calm, comfortable Eyes: PERRL, lids and conjunctivae normal ENMT: Mucous membranes are mildly dry. Posterior pharynx clear of any exudate or lesions. Neck: normal, supple, no masses, no thyromegaly Respiratory: Bibasilar rales with bilateral wheezing and mild rhonchi. Normal respiratory effort. No accessory muscle use.  Cardiovascular: Regular rate and rhythm, no murmurs / rubs / gallops. No extremity edema. 2+ pedal pulses. No carotid bruits.  Abdomen: Soft, no  tenderness, no masses palpated. No hepatosplenomegaly. Bowel sounds positive.  Musculoskeletal: no clubbing / cyanosis.  Good ROM, no contractures. Normal muscle tone.  Skin: Hyperpigmented macules on trunk and extremities. Neurologic: CN 2-12 grossly intact. Sensation intact, DTR normal. Strength 5/5 in all 4.  Psychiatric: Normal judgment and insight. Alert and oriented x 3. Normal mood.    Labs on Admission: I have personally reviewed following labs and imaging studies  CBC: Recent Labs  Lab 08/10/17 1105 08/12/17 1851 08/12/17 1902  WBC 11.8* 9.8  --   NEUTROABS 8.8* 6.1  --   HGB 9.0* 9.9* 11.2*  HCT 30.1* 32.8* 33.0*  MCV 78.0 78.8  --   PLT 237 239  --    Basic Metabolic Panel: Recent Labs  Lab 08/10/17 1105 08/12/17 1851 08/12/17 1902 08/12/17 1954  NA 141 140 139  --   K 4.1 4.1 4.0  --   CL 103 100* 98*  --   CO2 29 30  --   --   GLUCOSE 158* 108* 102*  --   BUN 19 21* 20  --   CREATININE 1.04* 0.79 0.80  --   CALCIUM 8.9 9.3  --   --   MG  --   --   --  2.0  PHOS  --   --   --  3.3   GFR: Estimated Creatinine Clearance: 46 mL/min (by C-G formula based on SCr of 0.8  mg/dL). Liver Function Tests: Recent Labs  Lab 08/12/17 1851  AST 24  ALT 13*  ALKPHOS 101  BILITOT 0.4  PROT 7.9  ALBUMIN 3.5   No results for input(s): LIPASE, AMYLASE in the last 168 hours. No results for input(s): AMMONIA in the last 168 hours. Coagulation Profile: No results for input(s): INR, PROTIME in the last 168 hours. Cardiac Enzymes: Recent Labs  Lab 08/10/17 1105  TROPONINI 0.03*   BNP (last 3 results) No results for input(s): PROBNP in the last 8760 hours. HbA1C: No results for input(s): HGBA1C in the last 72 hours. CBG: Recent Labs  Lab 08/10/17 0807 08/12/17 2155  GLUCAP 137* 123*   Lipid Profile: No results for input(s): CHOL, HDL, LDLCALC, TRIG, CHOLHDL, LDLDIRECT in the last 72 hours. Thyroid Function Tests: No results for input(s): TSH, T4TOTAL, FREET4, T3FREE, THYROIDAB in the last 72 hours. Anemia Panel: No results for input(s): VITAMINB12, FOLATE, FERRITIN, TIBC, IRON, RETICCTPCT in the last 72 hours. Urine analysis:    Component Value Date/Time   COLORURINE YELLOW 08/12/2017 1904   APPEARANCEUR CLEAR 08/12/2017 1904   LABSPEC 1.017 08/12/2017 1904   PHURINE 7.0 08/12/2017 1904   GLUCOSEU 150 (A) 08/12/2017 1904   HGBUR NEGATIVE 08/12/2017 1904   BILIRUBINUR NEGATIVE 08/12/2017 1904   KETONESUR NEGATIVE 08/12/2017 1904   PROTEINUR NEGATIVE 08/12/2017 1904   NITRITE NEGATIVE 08/12/2017 1904   LEUKOCYTESUR NEGATIVE 08/12/2017 1904    Radiological Exams on Admission: Dg Chest 2 View  Result Date: 08/12/2017 CLINICAL DATA:  Loss of appetite, fever EXAM: CHEST - 2 VIEW COMPARISON:  06/17/2017 FINDINGS: Increased interstitial markings. Mild patchy opacity in the right mid lung, raising the possibility of pneumonia. No pleural effusion or pneumothorax. The heart is normal in size. Degenerative changes of the visualized thoracolumbar spine. Moderate compression fracture of a midthoracic vertebral body. Mild superior endplate compression fracture  of a upper lumbar vertebral body. These findings are unchanged. IMPRESSION: Mild patchy opacity in the right mid lung, raising the possibility of pneumonia. Electronically Signed   By:  Julian Hy M.D.   On: 08/12/2017 18:43    EKG: Independently reviewed.    Assessment/Plan Principal Problem:   CAP (community acquired pneumonia) Admit to telemetry/inpatient. Continue supplemental oxygen. Continue bronchodilators. Guaifenesin 600 mg p.o. twice daily. Continue COPD exacerbation treatment. Continue Levaquin 750 mg IVPB every 24 hours. Follow-up blood cultures and sensitivity. Check sputum culture and sensitivity. Check strep pneumonia urinary antigen.  Active Problems:  COPD with acute exacerbation (HCC) Continue supplemental oxygen. Continue schedule and as needed bronchodilators. Solu-Medrol for 24 hours. Switch to oral glucocorticoid on discharge.   HTN (hypertension) Continue Cardizem see the 180 mg p.o. daily. Continue lisinopril 10 mg p.o. daily. Monitor blood pressure, heart rate, renal function and electrolytes per    CKD (chronic kidney disease) stage 3, GFR 30-59 ml/min (HCC) Monitor renal function and electrolytes.    Dementia without behavioral disturbance Continue Namenda 10 mg p.o. twice daily.    Hyperlipidemia associated with type 2 diabetes mellitus (HCC) Continue atorvastatin 10 mg p.o. daily. Monitor LFTs. Fasting lipid follow-up as an outpatient.    GERD (gastroesophageal reflux disease) Famotidine 20 mg p.o. twice daily. Continue Carafate before meals twice a day     DVT prophylaxis: Lovenox SQ. Code Status: DNR. Family Communication:  Disposition Plan: Made for IV antibiotics for 2 to 3 days and COPD exacerbation treatment. Consults called: Admission status: Inpatient/telemetry.   Reubin Milan MD [facility] Pager [phone].  If 7PM-7AM, please contact night-coverage [email or web address] Password [email or web address]  08/12/2017, 11:45  PM

## 2017-08-12 NOTE — ED Triage Notes (Signed)
Pt is bedbound. Pt has had loss of appetite since, fever , rhonchi noted. Family worried about resp infection. Increased SHOB. Pt wears 2L all the time 94% is her norm sat on 2L.

## 2017-08-13 ENCOUNTER — Other Ambulatory Visit: Payer: Self-pay

## 2017-08-13 DIAGNOSIS — G301 Alzheimer's disease with late onset: Secondary | ICD-10-CM

## 2017-08-13 DIAGNOSIS — E1169 Type 2 diabetes mellitus with other specified complication: Secondary | ICD-10-CM

## 2017-08-13 DIAGNOSIS — I10 Essential (primary) hypertension: Secondary | ICD-10-CM

## 2017-08-13 DIAGNOSIS — R296 Repeated falls: Secondary | ICD-10-CM

## 2017-08-13 DIAGNOSIS — J441 Chronic obstructive pulmonary disease with (acute) exacerbation: Secondary | ICD-10-CM

## 2017-08-13 DIAGNOSIS — E785 Hyperlipidemia, unspecified: Secondary | ICD-10-CM

## 2017-08-13 DIAGNOSIS — E118 Type 2 diabetes mellitus with unspecified complications: Secondary | ICD-10-CM

## 2017-08-13 DIAGNOSIS — J181 Lobar pneumonia, unspecified organism: Secondary | ICD-10-CM

## 2017-08-13 DIAGNOSIS — R338 Other retention of urine: Secondary | ICD-10-CM

## 2017-08-13 DIAGNOSIS — F0280 Dementia in other diseases classified elsewhere without behavioral disturbance: Secondary | ICD-10-CM

## 2017-08-13 DIAGNOSIS — L8990 Pressure ulcer of unspecified site, unspecified stage: Secondary | ICD-10-CM

## 2017-08-13 LAB — GLUCOSE, CAPILLARY
GLUCOSE-CAPILLARY: 318 mg/dL — AB (ref 65–99)
GLUCOSE-CAPILLARY: 249 mg/dL — AB (ref 65–99)
Glucose-Capillary: 235 mg/dL — ABNORMAL HIGH (ref 65–99)

## 2017-08-13 LAB — STREP PNEUMONIAE URINARY ANTIGEN: Strep Pneumo Urinary Antigen: NEGATIVE

## 2017-08-13 MED ORDER — POLYETHYLENE GLYCOL 3350 17 G PO PACK
17.0000 g | PACK | Freq: Once | ORAL | Status: AC
  Administered 2017-08-13: 17 g via ORAL
  Filled 2017-08-13: qty 1

## 2017-08-13 MED ORDER — LIDOCAINE 5 % EX PTCH
1.0000 | MEDICATED_PATCH | CUTANEOUS | Status: DC
  Administered 2017-08-13: 1 via TRANSDERMAL
  Filled 2017-08-13: qty 1

## 2017-08-13 MED ORDER — LIDOCAINE 5 % EX PTCH
1.0000 | MEDICATED_PATCH | CUTANEOUS | Status: DC
  Administered 2017-08-13 – 2017-08-17 (×5): 1 via TRANSDERMAL
  Filled 2017-08-13 (×5): qty 1

## 2017-08-13 MED ORDER — HYDROCODONE-ACETAMINOPHEN 5-325 MG PO TABS
1.0000 | ORAL_TABLET | Freq: Three times a day (TID) | ORAL | Status: DC | PRN
Start: 2017-08-13 — End: 2017-08-17
  Administered 2017-08-13 – 2017-08-17 (×5): 1 via ORAL
  Filled 2017-08-13 (×5): qty 1

## 2017-08-13 MED ORDER — POLYETHYLENE GLYCOL 3350 17 G PO PACK
17.0000 g | PACK | Freq: Two times a day (BID) | ORAL | Status: DC | PRN
  Administered 2017-08-14 – 2017-08-15 (×3): 17 g via ORAL
  Filled 2017-08-13 (×3): qty 1

## 2017-08-13 MED ORDER — MOMETASONE FURO-FORMOTEROL FUM 100-5 MCG/ACT IN AERO
2.0000 | INHALATION_SPRAY | Freq: Two times a day (BID) | RESPIRATORY_TRACT | Status: DC
  Administered 2017-08-14 – 2017-08-16 (×6): 2 via RESPIRATORY_TRACT
  Filled 2017-08-13: qty 8.8

## 2017-08-13 NOTE — Progress Notes (Signed)
Per report pt had in and out cath in ED. Pt has not voided during this shift. Bladder scanner revealed 500 ml. On call provider R.Smith made aware. New orders placed to insert foley catheter. Will continue to monitor.

## 2017-08-13 NOTE — Progress Notes (Addendum)
PROGRESS NOTE    Newell Frater   IHK:[id number]  DOB: 1924-04-23  DOA: 08/12/2017 PCP: Lawerance Cruel, MD   Brief Narrative:  Candice Miller is a 84 y/o female who lives at home, uses a walker and has a h/o COPD who is now on 2 L O2 continuously since having RSV and Coronavirus in 3/19.  She also has DM2, HTN, CKD3, frequent falls,  Microcytic anemeia.  She states she was feeing bad and her home nurse noted a rattling in her chest and sent her to the ED.  In the ED she was noted to have a RML infiltrate and was admitted for pneumonia.  She also fell no her bottom on 4/29 and was seen in the ED. Since then she has had pain across her lower back and both hips. Imaging with a head CT without contrast unrevealing. Lumber xray revealed stable old compression deformities of T12 and L1 vertebral bodies and osteopenia   Subjective: She states to me today after much questioning that she has had a cough with yellow sputum for a few days now.  She also states she feels much better that a foley cath was placed overnight. ROS: no complaints of nausea, vomiting, constipation diarrhea, dyspnea or dysuria. No other complaints.   Assessment & Plan:   Principal Problem:   CAP (community acquired pneumonia)  - Right mid lung field crackles and RML infiltrate noted- she incidentally had RML nodule on CT in may and 72mo f/u CT was recommended (at her age, I would not pursue this) - cont Levaquin  Active Problems:    COPD with acute exacerbation  -No wheezing noted today- continue duo nebs and Dulera (uses Symbicort at home) -Hold Solu-Medrol  Urinary retention -Currently has a Foley catheter- we will give a voiding trial tomorrow morning  Lower back pain/   Frequent falls -Acute secondary to fall on 4/29- imaging in the ER with lumbar x-ray showed old compression fractures of T12 and L1 and no new issues - She has on a lidocaine patch which is being continued -We will obtain a PT eval to see if  she needs to go to SNF    HTN (hypertension) -Continue Cardizem and lisinopril    Dementia without behavioral disturbance -Namenda    Hyperlipidemia associated with type 2 diabetes mellitus  -Lipitor    GERD (gastroesophageal reflux disease) H2 blocker-Carafate     DM2 (diabetes mellitus, type 2)  -Amaryl continued- low-dose sliding scale ordered-sugars are elevated likely due to steroids which are now on hold- follow sugars without increasing medications     DVT prophylaxis: Lovenox Code Status: DO NOT RESUSCITATE Family Communication:  Disposition Plan:   treat pneumonia and follow-up on PT eval Consultants:   None Procedures:   None Antimicrobials:  Anti-infectives (From admission, onward)   Start     Dose/Rate Route Frequency Ordered Stop   08/14/17 2000  levofloxacin (LEVAQUIN) IVPB 750 mg     750 mg 100 mL/hr over 90 Minutes Intravenous Every 48 hours 08/12/17 2147 08/18/17 1959   08/13/17 2000  levofloxacin (LEVAQUIN) IVPB 750 mg  Status:  Discontinued     750 mg 100 mL/hr over 90 Minutes Intravenous Every 24 hours 08/12/17 2001 08/12/17 2147   08/12/17 2000  levofloxacin (LEVAQUIN) IVPB 750 mg     750 mg 100 mL/hr over 90 Minutes Intravenous  Once 08/12/17 1945 08/12/17 2142       Objective: Vitals:   08/12/17 2156 08/12/17 2209 08/13/17 0600 08/13/17  0745  BP:   (!) 146/63   Pulse:   82 88  Resp:   20 20  Temp:   98.3 F (36.8 C)   TempSrc:   Oral   SpO2:  92% 96% 98%  Weight: 84.2 kg (185 lb 10 oz)     Height: 5\' 1"  (1.549 m)       Intake/Output Summary (Last 24 hours) at 08/13/2017 0857 Last data filed at 08/13/2017 0330 Gross per 24 hour  Intake 0 ml  Output 700 ml  Net -700 ml   Filed Weights   08/12/17 1814 08/12/17 2156  Weight: 83.5 kg (184 lb) 84.2 kg (185 lb 10 oz)    Examination: General exam: Appears comfortable  HEENT: PERRLA, oral mucosa moist, no sclera icterus or thrush Respiratory system: Crackles in the right mid lung  field-no wheezing-respiratory effort normal. Cardiovascular system: S1 & S2 heard, RRR.   Gastrointestinal system: Abdomen soft, non-tender, nondistended. Normal bowel sound. No organomegaly Central nervous system: Alert and oriented. No focal neurological deficits. Extremities: No cyanosis, clubbing or edema Musculoskeletal: Tenderness in lumbar spine Skin: No rashes or ulcers Psychiatry:  Mood & affect appropriate.     Data Reviewed: I have personally reviewed following labs and imaging studies  CBC: Recent Labs  Lab 08/10/17 1105 08/12/17 1851 08/12/17 1902  WBC 11.8* 9.8  --   NEUTROABS 8.8* 6.1  --   HGB 9.0* 9.9* 11.2*  HCT 30.1* 32.8* 33.0*  MCV 78.0 78.8  --   PLT 237 239  --    Basic Metabolic Panel: Recent Labs  Lab 08/10/17 1105 08/12/17 1851 08/12/17 1902 08/12/17 1954  NA 141 140 139  --   K 4.1 4.1 4.0  --   CL 103 100* 98*  --   CO2 29 30  --   --   GLUCOSE 158* 108* 102*  --   BUN 19 21* 20  --   CREATININE 1.04* 0.79 0.80  --   CALCIUM 8.9 9.3  --   --   MG  --   --   --  2.0  PHOS  --   --   --  3.3   GFR: Estimated Creatinine Clearance: 46 mL/min (by C-G formula based on SCr of 0.8 mg/dL). Liver Function Tests: Recent Labs  Lab 08/12/17 1851  AST 24  ALT 13*  ALKPHOS 101  BILITOT 0.4  PROT 7.9  ALBUMIN 3.5   No results for input(s): LIPASE, AMYLASE in the last 168 hours. No results for input(s): AMMONIA in the last 168 hours. Coagulation Profile: No results for input(s): INR, PROTIME in the last 168 hours. Cardiac Enzymes: Recent Labs  Lab 08/10/17 1105  TROPONINI 0.03*   BNP (last 3 results) No results for input(s): PROBNP in the last 8760 hours. HbA1C: No results for input(s): HGBA1C in the last 72 hours. CBG: Recent Labs  Lab 08/10/17 0807 08/12/17 2155 08/13/17 0736  GLUCAP 137* 123* 235*   Lipid Profile: No results for input(s): CHOL, HDL, LDLCALC, TRIG, CHOLHDL, LDLDIRECT in the last 72 hours. Thyroid Function  Tests: No results for input(s): TSH, T4TOTAL, FREET4, T3FREE, THYROIDAB in the last 72 hours. Anemia Panel: No results for input(s): VITAMINB12, FOLATE, FERRITIN, TIBC, IRON, RETICCTPCT in the last 72 hours. Urine analysis:    Component Value Date/Time   COLORURINE YELLOW 08/12/2017 1904   APPEARANCEUR CLEAR 08/12/2017 1904   LABSPEC 1.017 08/12/2017 1904   PHURINE 7.0 08/12/2017 1904   GLUCOSEU 150 (A) 08/12/2017  [place] 08/12/2017 1904   BILIRUBINUR NEGATIVE 08/12/2017 [place] 08/12/2017 1904   PROTEINUR NEGATIVE 08/12/2017 1904   NITRITE NEGATIVE 08/12/2017 1904   LEUKOCYTESUR NEGATIVE 08/12/2017 1904   Sepsis Labs: @LABRCNTIP (procalcitonin:4,lacticidven:4) )No results found for this or any previous visit (from the past 240 hour(s)).       Radiology Studies: Dg Chest 2 View  Result Date: 08/12/2017 CLINICAL DATA:  Loss of appetite, fever EXAM: CHEST - 2 VIEW COMPARISON:  06/17/2017 FINDINGS: Increased interstitial markings. Mild patchy opacity in the right mid lung, raising the possibility of pneumonia. No pleural effusion or pneumothorax. The heart is normal in size. Degenerative changes of the visualized thoracolumbar spine. Moderate compression fracture of a midthoracic vertebral body. Mild superior endplate compression fracture of a upper lumbar vertebral body. These findings are unchanged. IMPRESSION: Mild patchy opacity in the right mid lung, raising the possibility of pneumonia. Electronically Signed   By: Julian Hy M.D.   On: 08/12/2017 18:43      Scheduled Meds: . atorvastatin  10 mg Oral q1800  . calcium carbonate  500 mg of elemental calcium Oral Daily  . cholecalciferol  1,000 Units Oral Daily  . diltiazem  180 mg Oral Daily  . enoxaparin (LOVENOX) injection  40 mg Subcutaneous Q24H  . famotidine  20 mg Oral BID  . gabapentin  300 mg Oral QHS  . glimepiride  1 mg Oral Q breakfast  . guaiFENesin  600 mg Oral BID  .  insulin aspart  0-9 Units Subcutaneous TID WC  . ipratropium-albuterol  3 mL Nebulization QID  . iron polysaccharides  150 mg Oral Daily  . l-methylfolate-B6-B12  1 tablet Oral BID  . lidocaine  1 patch Transdermal Q24H  . lisinopril  10 mg Oral Daily  . loratadine  10 mg Oral Daily  . magnesium oxide  200 mg Oral QHS  . memantine  10 mg Oral BID  . sucralfate  1 g Oral BID   Continuous Infusions: . [START ON 08/14/2017] levofloxacin (LEVAQUIN) IV       LOS: 1 day    Time spent in minutes: 35    Debbe Odea, MD [facility] Pager: [email or web address] Password [facility] 08/13/2017, 8:57 AM

## 2017-08-14 DIAGNOSIS — N183 Chronic kidney disease, stage 3 (moderate): Secondary | ICD-10-CM

## 2017-08-14 LAB — GLUCOSE, CAPILLARY
GLUCOSE-CAPILLARY: 174 mg/dL — AB (ref 65–99)
GLUCOSE-CAPILLARY: 200 mg/dL — AB (ref 65–99)
Glucose-Capillary: 176 mg/dL — ABNORMAL HIGH (ref 65–99)
Glucose-Capillary: 140 mg/dL — ABNORMAL HIGH (ref 65–99)
Glucose-Capillary: 177 mg/dL — ABNORMAL HIGH (ref 65–99)

## 2017-08-14 LAB — URINE CULTURE: Culture: NO GROWTH

## 2017-08-14 NOTE — Evaluation (Signed)
Physical Therapy Evaluation Patient Details Name: Candice Miller MRN: [id number] DOB: 1924/06/05 Today's Date: 08/14/2017   History of Present Illness  84 yo female admitted with Pna, back pain after a fall at home. Hx of DM, COPD, frequent falls, anemia, breast cancer, osteoporosis, RSV, comp fx T12/L1, dementia    Clinical Impression  On eval, pt required Mod assist for mobility. She walked ~10 feet with a RW. Pt presents with general weakness, decreased activity tolerance, and impaired gait and balance. Pt rated back pain 7/10 at rest and > with activity. Remained on [place] O2. Pt fatigues easily with activity. Discussed d/c plan-pt is open to ST rehab at [facility]. Will continue to follow and progress activity as tolerated.     Follow Up Recommendations SNF    Equipment Recommendations  None recommended by PT    Recommendations for Other Services       Precautions / Restrictions Precautions Precautions: Fall Precaution Comments: O2 dep Restrictions Weight Bearing Restrictions: No      Mobility  Bed Mobility Overal bed mobility: Needs Assistance Bed Mobility: Supine to Sit     Supine to sit: HOB elevated;Min assist     General bed mobility comments: Increaesed time. Utilized bedpad to help pt scoot to EOB. Pt relied on bedrail  Transfers Overall transfer level: Needs assistance Equipment used: Rolling walker (2 wheeled) Transfers: Sit to/from [facility] Sit to Stand: From elevated surface;Mod assist Stand pivot transfers: Min assist       General transfer comment: Assist to rise, stabilize, control descent. VCs safety, technique, hand placement. Increased time.   Ambulation/Gait Ambulation/Gait assistance: Min assist;+2 safety/equipment Ambulation Distance (Feet): 10 Feet Assistive device: Rolling walker (2 wheeled) Gait Pattern/deviations: Step-through pattern;Decreased stride length;Trunk flexed     General Gait Details: Assist to stabilize pt  throughout distance and to maneuver with RW. Pt is unsteady and risk for falls. Increased back pain with activity per pt report. Remained on [place] O2. Pt fatigues easily/quickly.   Stairs            Wheelchair Mobility    Modified Rankin (Stroke Patients Only)       Balance Overall balance assessment: Needs assistance         Standing balance support: Bilateral upper extremity supported Standing balance-Leahy Scale: Poor                               Pertinent Vitals/Pain Pain Assessment: 0-10 Pain Score: 7  Pain Location: low back Pain Descriptors / Indicators: Aching;Sore;Discomfort Pain Intervention(s): Monitored during session    Home Living Family/patient expects to be discharged to:: Private residence Living Arrangements: Children Available Help at Discharge: Family Type of Home: (townhome) Home Access: Stairs to enter   Technical brewer of Steps: 1 Home Layout: One level Home Equipment: Environmental consultant - 4 wheels;Bedside commode;Tub bench      Prior Function Level of Independence: Needs assistance   Gait / Transfers Assistance Needed: Ambulates with rollator vs RW household distances  ADL's / Homemaking Assistance Needed: Daughter assists with bathing and tub transfers. At times daughter assists with dressing.  Makes coffee in the mornings. Makes simple meals on her own unless sick (then daughter assists).         Hand Dominance        Extremity/Trunk Assessment   Upper Extremity Assessment Upper Extremity Assessment: Generalized weakness    Lower Extremity Assessment Lower Extremity Assessment: Generalized weakness  Cervical / Trunk Assessment Cervical / Trunk Assessment: Normal  Communication   Communication: No difficulties  Cognition Arousal/Alertness: Awake/alert Behavior During Therapy: WFL for tasks assessed/performed Overall Cognitive Status: Within Functional Limits for tasks assessed                                         General Comments      Exercises     Assessment/Plan    PT Assessment Patient needs continued PT services  PT Problem List Decreased strength;Decreased balance;Decreased mobility;Decreased activity tolerance;Decreased knowledge of use of DME;Pain       PT Treatment Interventions Gait training;DME instruction;Functional mobility training;Therapeutic activities;Balance training;Patient/family education;Therapeutic exercise    PT Goals (Current goals can be found in the Care Plan section)  Acute Rehab PT Goals Patient Stated Goal: less pain. to get better PT Goal Formulation: With patient Time For Goal Achievement: 08/28/17 Potential to Achieve Goals: Good    Frequency Min 3X/week   Barriers to discharge        Co-evaluation               AM-PAC PT "6 Clicks" Daily Activity  Outcome Measure Difficulty turning over in bed (including adjusting bedclothes, sheets and blankets)?: Unable Difficulty moving from lying on back to sitting on the side of the bed? : Unable Difficulty sitting down on and standing up from a chair with arms (e.g., wheelchair, bedside commode, etc,.)?: Unable Help needed moving to and from a bed to chair (including a wheelchair)?: A Lot Help needed walking in hospital room?: A Lot Help needed climbing 3-5 steps with a railing? : Total 6 Click Score: 8    End of Session Equipment Utilized During Treatment: Gait belt;Oxygen Activity Tolerance: Patient limited by fatigue;Patient limited by pain Patient left: in chair;with call bell/phone within reach;with chair alarm set   PT Visit Diagnosis: Muscle weakness (generalized) (M62.81);Difficulty in walking, not elsewhere classified (R26.2);Pain Pain - part of body: (back)    Time: 1001-1037 PT Time Calculation (min) (ACUTE ONLY): 36 min   Charges:   PT Evaluation $PT Eval Moderate Complexity: 1 Mod PT Treatments $Gait Training: 8-22 mins   PT G Codes:          Weston Anna, MPT Pager: [phone]

## 2017-08-14 NOTE — Progress Notes (Signed)
PROGRESS NOTE    Candice Miller   ZYS:[id number]  DOB: 1924/12/09  DOA: 08/12/2017 PCP: Lawerance Cruel, MD   Brief Narrative:  Candice Miller is a 84 y/o female who lives at home, uses a walker and has a h/o COPD who is now on 2 L O2 continuously since having RSV and Coronavirus in 3/19.  She also has DM2, HTN, CKD3, frequent falls,  Microcytic anemeia.  She states she was feeling bad and her home nurse noted a rattling in her chest and sent her to the ED.  In the ED she was noted to have a RML infiltrate and was admitted for pneumonia.  She also fell no her bottom on 4/29 and was seen in the ED. Since then she has had pain across her lower back and both hips. Imaging with a head CT without contrast unrevealing. Lumber xray revealed stable old compression deformities of T12 and L1 vertebral bodies and osteopenia   Subjective: Feels better today. Less pain in bottom/ hips. Less cough. Daughter had extensive conversation with me. She is debating taking her home with hospice vs rehab facility first followed by home with hospice. She would like to talk to palliative care.  Assessment & Plan:   Principal Problem:   CAP (community acquired pneumonia)  - Right mid lung field crackles and RML infiltrate noted- she incidentally had RML nodule on CT in may and 49mo f/u CT was recommended (at her age, I would not pursue this) - cont Levaquin as she cannot recall how severe her allergy to Keflex was  Active Problems:    COPD with acute exacerbation  -No wheezing noted today- continue duo nebs and Dulera (uses Symbicort at home) -cont to Hold off on steroids  Urinary retention  - Foley catheter placed overnight on 5/2-  - did well with voiding trial today  Lower back pain/   Frequent falls -Acute secondary to fall on 4/29- imaging in the ER with lumbar x-ray showed old compression fractures of T12 and L1 and no new issues - She has on a lidocaine patch which is being continued -pain  better today- per PT, needs SNF    HTN (hypertension) -Continue Cardizem and lisinopril    Dementia without behavioral disturbance -Namenda    Hyperlipidemia associated with type 2 diabetes mellitus  -Lipitor    GERD (gastroesophageal reflux disease) H2 blocker-Carafate     DM2 (diabetes mellitus, type 2)  -Amaryl continued- low-dose sliding scale ordered-sugars are elevated likely due to steroids which are now on hold- follow sugars without increasing medications     DVT prophylaxis: Lovenox Code Status: DO NOT RESUSCITATE Family Communication:  Disposition Plan:    SNF vs home with hospice Consultants:   Awaiting palliative care eval Procedures:   None Antimicrobials:  Anti-infectives (From admission, onward)   Start     Dose/Rate Route Frequency Ordered Stop   08/14/17 2000  levofloxacin (LEVAQUIN) IVPB 750 mg     750 mg 100 mL/hr over 90 Minutes Intravenous Every 48 hours 08/12/17 2147 08/18/17 1959   08/13/17 2000  levofloxacin (LEVAQUIN) IVPB 750 mg  Status:  Discontinued     750 mg 100 mL/hr over 90 Minutes Intravenous Every 24 hours 08/12/17 2001 08/12/17 2147   08/12/17 2000  levofloxacin (LEVAQUIN) IVPB 750 mg     750 mg 100 mL/hr over 90 Minutes Intravenous  Once 08/12/17 1945 08/12/17 2142       Objective: Vitals:   08/14/17 0843 08/14/17 1203 08/14/17 1421  08/14/17 1623  BP: 132/64  (!) 110/47   Pulse:   72   Resp:   18   Temp:   98.2 F (36.8 C)   TempSrc:   Oral   SpO2:  97% 100% 96%  Weight:      Height:        Intake/Output Summary (Last 24 hours) at 08/14/2017 1818 Last data filed at 08/14/2017 1544 Gross per 24 hour  Intake 240 ml  Output 1400 ml  Net -1160 ml   Filed Weights   08/12/17 1814 08/12/17 2156  Weight: 83.5 kg (184 lb) 84.2 kg (185 lb 10 oz)    Examination: General exam: Appears comfortable  HEENT: PERRLA, oral mucosa moist, no sclera icterus or thrush Respiratory system: Crackles in the right mid lung field-no  wheezing-respiratory effort normal. Cardiovascular system: S1 & S2 heard, RRR.   Gastrointestinal system: Abdomen soft, non-tender, nondistended. Normal bowel sound. No organomegaly Central nervous system: Alert and oriented. No focal neurological deficits. Extremities: No cyanosis, clubbing or edema Musculoskeletal: Tenderness in lumbar spine Skin: No rashes or ulcers Psychiatry:  Mood & affect appropriate.     Data Reviewed: I have personally reviewed following labs and imaging studies  CBC: Recent Labs  Lab 08/10/17 1105 08/12/17 1851 08/12/17 1902  WBC 11.8* 9.8  --   NEUTROABS 8.8* 6.1  --   HGB 9.0* 9.9* 11.2*  HCT 30.1* 32.8* 33.0*  MCV 78.0 78.8  --   PLT 237 239  --    Basic Metabolic Panel: Recent Labs  Lab 08/10/17 1105 08/12/17 1851 08/12/17 1902 08/12/17 1954  NA 141 140 139  --   K 4.1 4.1 4.0  --   CL 103 100* 98*  --   CO2 29 30  --   --   GLUCOSE 158* 108* 102*  --   BUN 19 21* 20  --   CREATININE 1.04* 0.79 0.80  --   CALCIUM 8.9 9.3  --   --   MG  --   --   --  2.0  PHOS  --   --   --  3.3   GFR: Estimated Creatinine Clearance: 46 mL/min (by C-G formula based on SCr of 0.8 mg/dL). Liver Function Tests: Recent Labs  Lab 08/12/17 1851  AST 24  ALT 13*  ALKPHOS 101  BILITOT 0.4  PROT 7.9  ALBUMIN 3.5   No results for input(s): LIPASE, AMYLASE in the last 168 hours. No results for input(s): AMMONIA in the last 168 hours. Coagulation Profile: No results for input(s): INR, PROTIME in the last 168 hours. Cardiac Enzymes: Recent Labs  Lab 08/10/17 1105  TROPONINI 0.03*   BNP (last 3 results) No results for input(s): PROBNP in the last 8760 hours. HbA1C: No results for input(s): HGBA1C in the last 72 hours. CBG: Recent Labs  Lab 08/13/17 1159 08/13/17 1658 08/13/17 2053 08/14/17 0734 08/14/17 1147  GLUCAP 318* 249* 176* 140* 177*   Lipid Profile: No results for input(s): CHOL, HDL, LDLCALC, TRIG, CHOLHDL, LDLDIRECT in the  last 72 hours. Thyroid Function Tests: No results for input(s): TSH, T4TOTAL, FREET4, T3FREE, THYROIDAB in the last 72 hours. Anemia Panel: No results for input(s): VITAMINB12, FOLATE, FERRITIN, TIBC, IRON, RETICCTPCT in the last 72 hours. Urine analysis:    Component Value Date/Time   COLORURINE YELLOW 08/12/2017 1904   APPEARANCEUR CLEAR 08/12/2017 1904   LABSPEC 1.017 08/12/2017 1904   PHURINE 7.0 08/12/2017 1904   GLUCOSEU 150 (A) 08/12/2017 1904  HGBUR NEGATIVE 08/12/2017 1904   BILIRUBINUR NEGATIVE 08/12/2017 [place] 08/12/2017 1904   PROTEINUR NEGATIVE 08/12/2017 1904   NITRITE NEGATIVE 08/12/2017 1904   LEUKOCYTESUR NEGATIVE 08/12/2017 1904   Sepsis Labs: @LABRCNTIP (procalcitonin:4,lacticidven:4) ) Recent Results (from the past 240 hour(s))  Blood Culture (routine x 2)     Status: None (Preliminary result)   Collection Time: 08/12/17  6:51 PM  Result Value Ref Range Status   Specimen Description   Final    BLOOD RIGHT FOREARM Performed at [facility] [address] [place], [place] [address]    Special Requests   Final    BOTTLES DRAWN AEROBIC AND ANAEROBIC Blood Culture adequate volume Performed at [facility] [address] [place], [place] [address]    Culture   Final    NO GROWTH 2 DAYS Performed at [facility] [address] [place], [place] [address]    Report Status PENDING  Incomplete  Blood Culture (routine x 2)     Status: None (Preliminary result)   Collection Time: 08/12/17  6:52 PM  Result Value Ref Range Status   Specimen Description   Final    BLOOD RIGHT HAND Performed at [facility] [address] [place], [place] [address]    Special Requests   Final    BOTTLES DRAWN AEROBIC AND ANAEROBIC Blood Culture results may not be optimal due to an inadequate volume of blood received in culture bottles Performed at [facility]  [address], [place], [place] [address]    Culture   Final    NO GROWTH 2 DAYS Performed at [facility] [address] [place], [place] [address]    Report Status PENDING  Incomplete  Urine culture     Status: None   Collection Time: 08/12/17  7:04 PM  Result Value Ref Range Status   Specimen Description   Final    URINE, CATHETERIZED Performed at [facility] [address] [place], [place] [address]    Special Requests   Final    NONE Performed at [facility], [place] [address] [place], [place] [address]    Culture   Final    NO GROWTH Performed at [facility], [place] [address], [place],  [address]    Report Status 08/14/2017 FINAL  Final         Radiology Studies: Dg Chest 2 View  Result Date: 08/12/2017 CLINICAL DATA:  Loss of appetite, fever EXAM: CHEST - 2 VIEW COMPARISON:  06/17/2017 FINDINGS: Increased interstitial markings. Mild patchy opacity in the right mid lung, raising the possibility of pneumonia. No pleural effusion or pneumothorax. The heart is normal in size. Degenerative changes of the visualized thoracolumbar spine. Moderate compression fracture of a midthoracic vertebral body. Mild superior endplate compression fracture of a upper lumbar vertebral body. These findings are unchanged. IMPRESSION: Mild patchy opacity in the right mid lung, raising the possibility of pneumonia. Electronically Signed   By: Julian Hy M.D.   On: 08/12/2017 18:43      Scheduled Meds: . atorvastatin  10 mg Oral q1800  . calcium carbonate  500 mg of elemental calcium Oral Daily  . cholecalciferol  1,000 Units Oral Daily  . diltiazem  180 mg Oral Daily  . enoxaparin (LOVENOX) injection  40 mg Subcutaneous Q24H  . famotidine  20 mg Oral BID  . gabapentin  300 mg Oral QHS  .  glimepiride  1 mg Oral Q breakfast  . guaiFENesin  600 mg Oral BID  . insulin aspart  0-9 Units Subcutaneous TID WC  . ipratropium-albuterol  3  mL Nebulization QID  . iron polysaccharides  150 mg Oral Daily  . l-methylfolate-B6-B12  1 tablet Oral BID  . lidocaine  1 patch Transdermal Q24H  . lisinopril  10 mg Oral Daily  . loratadine  10 mg Oral Daily  . magnesium oxide  200 mg Oral QHS  . memantine  10 mg Oral BID  . mometasone-formoterol  2 puff Inhalation BID  . sucralfate  1 g Oral BID   Continuous Infusions: . levofloxacin (LEVAQUIN) IV       LOS: 2 days    Time spent in minutes: 35    Debbe Odea, MD [facility] Pager: [email or web address] Password [email or web address] 08/14/2017, 6:18 PM

## 2017-08-14 NOTE — Care Management Note (Signed)
Case Management Note  Patient Details  Name: Candice Miller MRN: [id number] Date of Birth: [birth year]  Subjective/Objective: PT recc SNF-CSW notified. Per dtr [name] c#[phone]-patient has gone to [facility] in past, also mentioned home w/hospice, but she is realistice that a higher level care may be needed.Has dme @ home-hospital bed,rw,3n1. Palliative Care cons-await recc.                   Action/Plan:d/c plan SNF.   Expected Discharge Date:                  Expected Discharge Plan:  Skilled Nursing Facility  In-House Referral:  Clinical Social Work  Discharge planning Services  CM Consult  Post Acute Care Choice:  Home Health([facility]-RN/PT.) Choice offered to:     DME Arranged:    DME Agency:     HH Arranged:    [facility] Agency:     Status of Service:     If discussed at [employer], dates discussed:    Additional Comments:  Dessa Phi, RN 08/14/2017, 11:06 AM

## 2017-08-15 DIAGNOSIS — Z515 Encounter for palliative care: Secondary | ICD-10-CM

## 2017-08-15 DIAGNOSIS — Z7189 Other specified counseling: Secondary | ICD-10-CM

## 2017-08-15 LAB — GLUCOSE, CAPILLARY
GLUCOSE-CAPILLARY: 179 mg/dL — AB (ref 65–99)
Glucose-Capillary: 107 mg/dL — ABNORMAL HIGH (ref 65–99)
Glucose-Capillary: 181 mg/dL — ABNORMAL HIGH (ref 65–99)
Glucose-Capillary: 231 mg/dL — ABNORMAL HIGH (ref 65–99)

## 2017-08-15 MED ORDER — IPRATROPIUM-ALBUTEROL 0.5-2.5 (3) MG/3ML IN SOLN
3.0000 mL | Freq: Three times a day (TID) | RESPIRATORY_TRACT | Status: DC
  Administered 2017-08-16: 3 mL via RESPIRATORY_TRACT
  Filled 2017-08-15: qty 3

## 2017-08-15 MED ORDER — BISACODYL 10 MG RE SUPP: 10.0000 mg | Freq: Every day | RECTAL | Status: DC | PRN

## 2017-08-15 MED ORDER — BISACODYL 10 MG RE SUPP
10.0000 mg | Freq: Every day | RECTAL | Status: DC
  Administered 2017-08-15 – 2017-08-17 (×3): 10 mg via RECTAL
  Filled 2017-08-15 (×3): qty 1

## 2017-08-15 MED ORDER — SORBITOL 70 % SOLN
960.0000 mL | TOPICAL_OIL | Freq: Once | ORAL | Status: DC | PRN
  Filled 2017-08-15: qty 473

## 2017-08-15 MED ORDER — POLYETHYLENE GLYCOL 3350 17 G PO PACK
17.0000 g | PACK | Freq: Two times a day (BID) | ORAL | Status: DC
  Administered 2017-08-15 – 2017-08-17 (×4): 17 g via ORAL
  Filled 2017-08-15 (×4): qty 1

## 2017-08-15 NOTE — Progress Notes (Signed)
Patient had large BM after ducolax supp. She did not require smog enema.  Barbee Shropshire. Brigitte Pulse, RN

## 2017-08-15 NOTE — Progress Notes (Signed)
PROGRESS NOTE    Candice Miller   HWE:[id number]  DOB: 1924-08-26  DOA: 08/12/2017 PCP: Lawerance Cruel, MD   Brief Narrative:  Candice Miller is a 84 y/o female who lives at home, uses a walker and has a h/o COPD who is now on 2 L O2 continuously since having RSV and Coronavirus in 3/19.  She also has DM2, HTN, CKD3, frequent falls,  Microcytic anemeia.  She states she was feeling bad and her home nurse noted a rattling in her chest and sent her to the ED.  In the ED she was noted to have a RML infiltrate and was admitted for pneumonia.  She also fell no her bottom on 4/29 and was seen in the ED. Since then she has had pain across her lower back and both hips. Imaging with a head CT without contrast unrevealing. Lumber xray revealed stable old compression deformities of T12 and L1 vertebral bodies and osteopenia   Subjective: Feels very weak today. Daughter states patient has not had a BM in 5 days.   Assessment & Plan:   Principal Problem:   CAP (community acquired pneumonia)  - Right mid lung field crackles and RML infiltrate noted- she incidentally had RML nodule on CT in may and 37mo f/u CT was recommended (at her age, I would not pursue this) - cont Levaquin as she cannot recall how severe her allergy to Keflex was  Active Problems:    COPD with acute exacerbation  -No wheezing noted today- continue duo nebs and Dulera (uses Symbicort at home) -cont to Hold off on steroids  Urinary retention  - Foley catheter placed overnight on 5/2-  - did well with voiding trial ton 5/3  Constipation - dulcolax suppository today- if no BM, then will need enema  Lower back pain/   Frequent falls -Acute secondary to fall on 4/29- imaging in the ER with lumbar x-ray showed old compression fractures of T12 and L1 and no new issues - She has on a lidocaine patch which is being continued - per PT, needs SNF    HTN (hypertension) -Continue Cardizem and lisinopril    Dementia without  behavioral disturbance -Namenda    Hyperlipidemia associated with type 2 diabetes mellitus  -Lipitor    GERD (gastroesophageal reflux disease) H2 blocker-Carafate     DM2 (diabetes mellitus, type 2)  -Amaryl continued- low-dose sliding scale ordered-sugars are elevated likely due to steroids which are now on hold- follow sugars without increasing medications    Disposition: Patient and daughter have spoken with palliative care today- they have decided on going to skilled nursing facility with outpatient palliative care follow-up  DVT prophylaxis: Lovenox Code Status: DO NOT RESUSCITATE Family Communication:  Disposition Plan:    SNF-social worker consult Consultants:    palliative care   Procedures:   None Antimicrobials:  Anti-infectives (From admission, onward)   Start     Dose/Rate Route Frequency Ordered Stop   08/14/17 2000  levofloxacin (LEVAQUIN) IVPB 750 mg     750 mg 100 mL/hr over 90 Minutes Intravenous Every 48 hours 08/12/17 2147 08/18/17 1959   08/13/17 2000  levofloxacin (LEVAQUIN) IVPB 750 mg  Status:  Discontinued     750 mg 100 mL/hr over 90 Minutes Intravenous Every 24 hours 08/12/17 2001 08/12/17 2147   08/12/17 2000  levofloxacin (LEVAQUIN) IVPB 750 mg     750 mg 100 mL/hr over 90 Minutes Intravenous  Once 08/12/17 1945 08/12/17 2142  Objective: Vitals:   08/14/17 2209 08/14/17 2213 08/15/17 0528 08/15/17 1052  BP:   (!) 120/51   Pulse:   73   Resp:   20   Temp:   98 F (36.7 C)   TempSrc:   Oral   SpO2: 97% 97% 99% 98%  Weight:      Height:        Intake/Output Summary (Last 24 hours) at 08/15/2017 1242 Last data filed at 08/15/2017 0949 Gross per 24 hour  Intake 750 ml  Output 500 ml  Net 250 ml   Filed Weights   08/12/17 1814 08/12/17 2156  Weight: 83.5 kg (184 lb) 84.2 kg (185 lb 10 oz)    Examination: General exam: Appears comfortable  HEENT: PERRLA, oral mucosa moist, no sclera icterus or thrush Respiratory system: Clear  to auscultation bilaterally-no wheezing-respiratory effort normal. Cardiovascular system: S1 & S2 heard, RRR.   Gastrointestinal system: Abdomen soft, non-tender, nondistended. Normal bowel sound. No organomegaly Central nervous system: Alert and oriented. No focal neurological deficits. Extremities: No cyanosis, clubbing or edema Musculoskeletal: Tenderness in lumbar spine Skin: No rashes or ulcers Psychiatry:  Mood & affect appropriate.     Data Reviewed: I have personally reviewed following labs and imaging studies  CBC: Recent Labs  Lab 08/10/17 1105 08/12/17 1851 08/12/17 1902  WBC 11.8* 9.8  --   NEUTROABS 8.8* 6.1  --   HGB 9.0* 9.9* 11.2*  HCT 30.1* 32.8* 33.0*  MCV 78.0 78.8  --   PLT 237 239  --    Basic Metabolic Panel: Recent Labs  Lab 08/10/17 1105 08/12/17 1851 08/12/17 1902 08/12/17 1954  NA 141 140 139  --   K 4.1 4.1 4.0  --   CL 103 100* 98*  --   CO2 29 30  --   --   GLUCOSE 158* 108* 102*  --   BUN 19 21* 20  --   CREATININE 1.04* 0.79 0.80  --   CALCIUM 8.9 9.3  --   --   MG  --   --   --  2.0  PHOS  --   --   --  3.3   GFR: Estimated Creatinine Clearance: 46 mL/min (by C-G formula based on SCr of 0.8 mg/dL). Liver Function Tests: Recent Labs  Lab 08/12/17 1851  AST 24  ALT 13*  ALKPHOS 101  BILITOT 0.4  PROT 7.9  ALBUMIN 3.5   No results for input(s): LIPASE, AMYLASE in the last 168 hours. No results for input(s): AMMONIA in the last 168 hours. Coagulation Profile: No results for input(s): INR, PROTIME in the last 168 hours. Cardiac Enzymes: Recent Labs  Lab 08/10/17 1105  TROPONINI 0.03*   BNP (last 3 results) No results for input(s): PROBNP in the last 8760 hours. HbA1C: No results for input(s): HGBA1C in the last 72 hours. CBG: Recent Labs  Lab 08/14/17 1147 08/14/17 1826 08/14/17 2125 08/15/17 0752 08/15/17 1143  GLUCAP 177* 174* 200* 107* 181*   Lipid Profile: No results for input(s): CHOL, HDL, LDLCALC,  TRIG, CHOLHDL, LDLDIRECT in the last 72 hours. Thyroid Function Tests: No results for input(s): TSH, T4TOTAL, FREET4, T3FREE, THYROIDAB in the last 72 hours. Anemia Panel: No results for input(s): VITAMINB12, FOLATE, FERRITIN, TIBC, IRON, RETICCTPCT in the last 72 hours. Urine analysis:    Component Value Date/Time   COLORURINE YELLOW 08/12/2017 1904   APPEARANCEUR CLEAR 08/12/2017 1904   LABSPEC 1.017 08/12/2017 1904   PHURINE 7.0 08/12/2017 1904  GLUCOSEU 150 (A) 08/12/2017 1904   HGBUR NEGATIVE 08/12/2017 1904   BILIRUBINUR NEGATIVE 08/12/2017 1904   KETONESUR NEGATIVE 08/12/2017 1904   PROTEINUR NEGATIVE 08/12/2017 1904   NITRITE NEGATIVE 08/12/2017 1904   LEUKOCYTESUR NEGATIVE 08/12/2017 1904   Sepsis Labs: @LABRCNTIP (procalcitonin:4,lacticidven:4) ) Recent Results (from the past 240 hour(s))  Blood Culture (routine x 2)     Status: None (Preliminary result)   Collection Time: 08/12/17  6:51 PM  Result Value Ref Range Status   Specimen Description   Final    BLOOD RIGHT FOREARM Performed at [facility], [place] [address] [place], [place] [address]    Special Requests   Final    BOTTLES DRAWN AEROBIC AND ANAEROBIC Blood Culture adequate volume Performed at [facility] [address], [place], [place] [address]    Culture   Final    NO GROWTH 3 DAYS Performed at [facility], [place] [address] [place], [place] [address]    Report Status PENDING  Incomplete  Blood Culture (routine x 2)     Status: None (Preliminary result)   Collection Time: 08/12/17  6:52 PM  Result Value Ref Range Status   Specimen Description   Final    BLOOD RIGHT HAND Performed at [facility] [address], [place], [place] [address]    Special Requests   Final    BOTTLES DRAWN AEROBIC AND ANAEROBIC Blood Culture results may not be optimal due to an inadequate volume of blood received in culture bottles Performed at [facility] [address] [place], [place] [address]    Culture   Final    NO GROWTH 3 DAYS Performed at [facility], [place] [address] [place], [place] [address]    Report Status PENDING  Incomplete  Urine culture     Status: None   Collection Time: 08/12/17  7:04 PM  Result Value Ref Range Status   Specimen Description   Final    URINE, CATHETERIZED Performed at [facility] [address] [place], [place] [address]    Special Requests   Final    NONE Performed at [facility], [place] [address], [place], [place] [address]    Culture   Final    NO GROWTH Performed at [facility], [place] [address] [place], [place] [address]    Report Status 08/14/2017 FINAL  Final         Radiology Studies: No results found.    Scheduled Meds: . atorvastatin  10 mg Oral q1800  . bisacodyl  10 mg Rectal Daily  . calcium carbonate  500 mg of elemental calcium Oral Daily  . cholecalciferol  1,000 Units Oral Daily  . diltiazem  180 mg Oral Daily  . enoxaparin (LOVENOX) injection  40 mg Subcutaneous Q24H  . famotidine  20 mg Oral BID  . gabapentin  300 mg Oral QHS  . glimepiride  1 mg Oral Q breakfast  . guaiFENesin  600 mg Oral BID  . insulin aspart  0-9 Units Subcutaneous TID WC  . ipratropium-albuterol  3 mL Nebulization QID  . iron polysaccharides  150 mg Oral Daily  . l-methylfolate-B6-B12  1 tablet Oral BID  . lidocaine  1 patch Transdermal Q24H  . lisinopril  10 mg Oral Daily  . loratadine  10 mg Oral Daily  . magnesium oxide  200 mg Oral QHS  . memantine  10 mg Oral BID  .  mometasone-formoterol  2 puff Inhalation BID  . polyethylene glycol  17 g Oral BID  . sucralfate  1 g Oral BID   Continuous Infusions: . levofloxacin (LEVAQUIN) IV Stopped (08/14/17 2259)     LOS: 3 days    Time spent in minutes: 35    Debbe Odea, MD [facility] Pager: [email or web address] Password [email or web address] 08/15/2017, 12:42 PM

## 2017-08-15 NOTE — Clinical Social Work Note (Signed)
Clinical Social Work Assessment  Patient Details  Name: Candice Miller MRN: [id number] Date of Birth: 11-Oct-1924  Date of referral:  08/15/17               Reason for consult:  Facility Placement, Discharge Planning                Permission sought to share information with:    Permission granted to share information::  Yes, Verbal Permission Granted  Name::        Agency::  SNF  Relationship::  Daughter -Saralyn Pilar Information:    [phone]  Housing/Transportation Living arrangements for the past 2 months:  [place] of Information:  Patient, Adult Children Patient Interpreter Needed:  None Criminal Activity/Legal Involvement Pertinent to Current Situation/Hospitalization:  Yes Significant Relationships:  Adult Children Lives with:    Do you feel safe going back to the place where you live?  Yes Need for family participation in patient care:  Yes (Depdenent with mobility and ADL's)  Care giving concerns:   SNF placement for short rehab.   Social Worker assessment / plan:  CSW met with patient and spoke to daughter via phone. Patient daughter reports she lives at home with patient and at this time is agreeable the patient will need rehab before returning home. Patient reports she was at [facility] about a month ago and "liked the facility."  Patient daughter reports she has been looking into LTC for her but is unsure how the patient will transition well there. She prefers to hire private caregivers. CSW informed daughter she can research agencies on [email or web address].  The patient uses a walker and Rolator at home. The daughter assist with bathing and dressing. Patient and daughter are both hopeful she will do well with therapy at SNF.   CSW contacted SNF liaison Nikki at [facility], she states she will not be able to admit patient Sunday but possibly Monday.   Plan: SNF  Employment status:  Retired Forensic scientist:  Medicare PT Recommendations:   [place] / Referral to community resources:  [place]  Patient/Family's Response to care:  Agreeable and Responding well to care.   Patient/Family's Understanding of and Emotional Response to Diagnosis, Current Treatment, and Prognosis:  Patient doesn not have a full understanding of diagnosis and medical history. She understands she will need rehab and transition home. Patient daughter has good understanding of the patient diagnosis and follow up treatment plan.   Emotional Assessment Appearance:  Appears stated age Attitude/Demeanor/Rapport:    Affect (typically observed):  Accepting Orientation:  Oriented to Self, Oriented to Place, Oriented to  Time, Oriented to Situation Alcohol / Substance use:  Not Applicable Psych involvement (Current and /or in the community):  No (Comment)  Discharge Needs  Concerns to be addressed:  Discharge Planning Concerns Readmission within the last 30 days:  Yes Current discharge risk:  Dependent with Mobility Barriers to Discharge:  Continued Medical Work up   [employer], [place] 08/15/2017, 2:07 PM

## 2017-08-15 NOTE — Consult Note (Signed)
Consultation Note Date: 08/15/2017   Patient Name: Candice Miller  DOB: 07-02-1924  MRN: [id number]  Age / Sex: 84 y.o., female  PCP: Candice Cruel, MD Referring Physician: Debbe Odea, MD  Reason for Consultation: Establishing goals of care  HPI/Patient Profile: 84 y.o. female   admitted on 08/12/2017   Clinical Assessment and Goals of Care:  patient is a 84 year old lady who lives at home with her daughter, has underlying history of chronic obstructive pulmonary disease. She was hospitalized in March with RSV and coronal virus and has been on supplemental oxygen 2 L via nasal cannula since then. Daughter states that the patient also has normal pressure hydrocephalus has been under neurologist evaluation several years ago. Because she was 84 years old at that time a shunt placement was deemed not appropriate. Also has underlying conditions of diabetes hypertension chronic kidney disease and anemia. Daughter is primary historian. Patient has had gradual progressive decline for the past 2 years. For the past year or so she has had more falls. She has had cellulitis lower extremities in the past. She has been living with her daughter in [place] for the last 5 years.  Daughter has been admitted to the hospital medicine service this time for pneumonia. Imaging has also shown T12-L1 vertebral bodies compression or deformities. Patient fell recently and has pain discomfort in her bottom/hips.  Palliative medicine consultation has been requested for goals of care discussions.  [name] is awake alert resting in bed. She had most of her breakfast. She states that she got some rest last night. She denies any acute complaints. Call placed and daughter arrived at the bedside in a family meeting ensued. I introduced myself and palliative care as follows: Palliative medicine is specialized medical care for people  living with serious illness. It focuses on providing relief from the symptoms and stress of a serious illness. The goal is to improve quality of life for both the patient and the family.  Patient's daughter states that she has been the patient's primary caregiver for the last 5 years. She has home health care periodically. She has equipment such as nebulizers, wheelchair, walker, shower bench and a hospital bed. Daughter is concerned that the patient is been having more falls and has been getting weaker. We discussed about the patient's decline trajectory and also discussed about goals wishes and values.  Patient complains of being constipated. She will be given a augmented bowel regimen. Patient becomes dejected and tearful during the family meeting. She states I hope that would not will take me soon I don't want to be a burden on my family. Daughter Candice Miller states that the patient has one daughter and one son, 3 children 2 orally. However, daughter Candice Miller has been the primary caregiver. Candice Miller states she works from home and is unable to be with the patient 24 7.   We discussed about having outpatient palliative support at the skilled nursing facility and proceeding with the rehabilitation attempt towards the end of this hospitalization. Subsequently, also  introduced hospice philosophy of care. The scope of care that can be provided in a residential hospice setting also discussed in detail. All of the patient and daughter's questions answered to the best of my ability. Offered active listening and supportive care and voiced understanding about unpredictable disease trajectory especially with chronic conditions such as chronic obstructive pulmonary disease.  HCPOA  daughter Candice Miller [phone].   SUMMARY OF RECOMMENDATIONS    SNF rehab with palliative care on discharge Continue current mode of care Daughter is requesting [facility] Rehab, request [facility] (Out patient palliative care program  through [place]) be set up on discharge.  Thank you for the consult.   Code Status/Advance Care Planning:  DNR    Symptom Management:    as above   Palliative Prophylaxis:   Bowel Regimen   Psycho-social/Spiritual:   Desire for further Chaplaincy support:yes  Additional Recommendations: Caregiving  Support/Resources  Prognosis:   < 12 months  Discharge Planning: [place] for rehab with Palliative care service follow-up      Primary Diagnoses: Present on Admission: . CAP (community acquired pneumonia) . (Resolved) CKD (chronic kidney disease) stage 3, GFR 30-59 ml/min (HCC) . HTN (hypertension) . COPD with acute exacerbation ([place]) . Dementia without behavioral disturbance . Hyperlipidemia associated with type 2 diabetes mellitus ([place]) . GERD (gastroesophageal reflux disease)   I have reviewed the medical record, interviewed the patient and family, and examined the patient. The following aspects are pertinent.  Past Medical History:  Diagnosis Date  . Anemia   . Breast cancer ([place]) 1994  . COPD (chronic obstructive pulmonary disease) ([place])   . Diabetes mellitus without complication ([place])    diet controlled vs. on glimepride  . Hypertension   . NPH (normal pressure hydrocephalus)   . Osteoporosis    Social History   Socioeconomic History  . Marital status: Widowed    Spouse name: Not on file  . Number of children: Not on file  . Years of education: Not on file  . Highest education level: Not on file  Occupational History  . Occupation: retired  Scientific laboratory technician  . Financial resource strain: Not on file  . Food insecurity:    Worry: Not on file    Inability: Not on file  . Transportation needs:    Medical: Not on file    Non-medical: Not on file  Tobacco Use  . Smoking status: Never Smoker  . Smokeless tobacco: Former Systems developer    Types: Snuff, Chew  . Tobacco comment: lived with smokers most of her life; oral tobacco for  10 years?  Substance and Sexual Activity  . Alcohol use: No  . Drug use: No  . Sexual activity: Not on file  Lifestyle  . Physical activity:    Days per week: Not on file    Minutes per session: Not on file  . Stress: Not on file  Relationships  . Social connections:    Talks on phone: Not on file    Gets together: Not on file    Attends religious service: Not on file    Active member of club or organization: Not on file    Attends meetings of clubs or organizations: Not on file    Relationship status: Not on file  Other Topics Concern  . Not on file  Social History Narrative   Lives with daughter and son-in-law.  Widowed.   Family History  Problem Relation Age of Onset  .  Hypertension Mother   . Diabetes Mother   . Heart failure Mother   . Stroke Mother   . Hypertension Father   . Diabetes Father   . Heart failure Father   . Stroke Father    Scheduled Meds: . atorvastatin  10 mg Oral q1800  . bisacodyl  10 mg Rectal Daily  . calcium carbonate  500 mg of elemental calcium Oral Daily  . cholecalciferol  1,000 Units Oral Daily  . diltiazem  180 mg Oral Daily  . enoxaparin (LOVENOX) injection  40 mg Subcutaneous Q24H  . famotidine  20 mg Oral BID  . gabapentin  300 mg Oral QHS  . glimepiride  1 mg Oral Q breakfast  . guaiFENesin  600 mg Oral BID  . insulin aspart  0-9 Units Subcutaneous TID WC  . ipratropium-albuterol  3 mL Nebulization QID  . iron polysaccharides  150 mg Oral Daily  . l-methylfolate-B6-B12  1 tablet Oral BID  . lidocaine  1 patch Transdermal Q24H  . lisinopril  10 mg Oral Daily  . loratadine  10 mg Oral Daily  . magnesium oxide  200 mg Oral QHS  . memantine  10 mg Oral BID  . mometasone-formoterol  2 puff Inhalation BID  . sucralfate  1 g Oral BID   Continuous Infusions: . levofloxacin (LEVAQUIN) IV Stopped (08/14/17 2259)   PRN Meds:.acetaminophen **OR** acetaminophen, albuterol, HYDROcodone-acetaminophen, nitroGLYCERIN, ondansetron **OR**  ondansetron (ZOFRAN) IV, polyethylene glycol Medications Prior to Admission:  Prior to Admission medications   Medication Sig Start Date End Date Taking? Authorizing Provider  acetaminophen (TYLENOL 8 HOUR) 650 MG CR tablet Take 1 tablet (650 mg total) by mouth every 8 (eight) hours as needed. Patient taking differently: Take 650 mg by mouth every 8 (eight) hours as needed for pain.  08/10/17  Yes Nanavati, Ankit, MD  atorvastatin (LIPITOR) 10 MG tablet Take 10 mg by mouth daily.   Yes [provider]  budesonide-formoterol (SYMBICORT) 80-4.5 MCG/ACT inhaler Inhale 2 puffs into the lungs 2 (two) times daily.   Yes [provider]  calcium gluconate 500 MG tablet Take 1 tablet by mouth daily.   Yes [provider]  cholecalciferol (VITAMIN D) 1000 units tablet Take 1,000 Units by mouth daily.   Yes [provider]  diltiazem (CARDIZEM CD) 180 MG 24 hr capsule Take 180 mg by mouth daily.   Yes [provider]  fexofenadine (ALLEGRA) 180 MG tablet Take 180 mg by mouth daily.   Yes [provider]  fluticasone (FLONASE) 50 MCG/ACT nasal spray Place 2 sprays into both nostrils daily.   Yes [provider]  gabapentin (NEURONTIN) 300 MG capsule Take 300 mg by mouth at bedtime.    Yes [provider]  glimepiride (AMARYL) 1 MG tablet Take 1 mg by mouth. Only takes this medication if blood sugars>150 per provider   Yes [provider]  ipratropium-albuterol (DUONEB) 0.5-2.5 (3) MG/3ML SOLN Take 3 mLs by nebulization 4 (four) times daily.    Yes [provider]  iron polysaccharides (NIFEREX) 150 MG capsule Take 150 mg by mouth daily.   Yes [provider]  l-methylfolate-B6-B12 (METANX) 3-35-2 MG TABS tablet Take 1 tablet by mouth 2 (two) times daily.    Yes [provider]  lisinopril (PRINIVIL,ZESTRIL) 10 MG tablet Take 10 mg by mouth daily.   Yes [provider]  Magnesium 250 MG TABS  Take 250 mg by mouth at bedtime.   Yes [provider]  memantine (NAMENDA) 10 MG tablet Take 10 mg by mouth 2 (two) times daily.   Yes [provider]  Omega-3 Fatty Acids (FISH OIL) 1000 MG CAPS Take 1,000 mg by mouth daily.   Yes [provider]  ranitidine (ZANTAC) 150 MG tablet Take 150 mg by mouth 2 (two) times daily.   Yes [provider]  sucralfate (CARAFATE) 1 g tablet Take 1 g by mouth 2 (two) times daily.   Yes [provider]  albuterol (PROVENTIL HFA;VENTOLIN HFA) 108 (90 Base) MCG/ACT inhaler Inhale 2 puffs into the lungs every 6 (six) hours as needed for wheezing.     [provider]  nitroGLYCERIN (NITROSTAT) 0.4 MG SL tablet Place 0.4 mg under the tongue every 5 (five) minutes as needed for chest pain.    [provider]  polyethylene glycol (MIRALAX / GLYCOLAX) packet Take 17 g by mouth daily. Patient not taking: Reported on 08/10/2017 01/01/17   Florencia Reasons, MD   Allergies  Allergen Reactions  . Keflex [Cephalexin]   . Nitrofurantoin   . Other     Pt reports "microdentin"  . Sulfa Antibiotics    Review of Systems +weakness +falls lately  Physical Exam Weak appearing lady NAD Appears tearful, dejected. S1 S2 Abdomen soft Has no edema Scant crackles  Vital Signs: BP (!) 120/51 (BP Location: Right Arm)   Pulse 73   Temp 98 F (36.7 C) (Oral)   Resp 20   Ht 5\' 1"  (1.549 m)   Wt 84.2 kg (185 lb 10 oz)   SpO2 98%   BMI 35.07 kg/m  Pain Scale: 0-10   Pain Score: 0-No pain   SpO2: SpO2: 98 % O2 Device:SpO2: 98 % O2 Flow Rate: .O2 Flow Rate (L/min): 2 L/min(uses 2 lpm at home)  IO: Intake/output summary:   Intake/Output Summary (Last 24 hours) at 08/15/2017 1156 Last data filed at 08/15/2017 0949 Gross per 24 hour  Intake 750 ml  Output 500 ml  Net 250 ml    LBM: Last BM Date: 08/10/17 Baseline Weight: Weight: 83.5 kg (184 lb) Most recent weight: Weight: 84.2 kg (185 lb 10 oz)       Palliative Assessment/Data:   PPS 40%  Time In:  11 Time Out:  12 Time Total:  60 min  Greater than 50%  of this time was spent counseling and coordinating care related to the above assessment and plan.  Signed by: Loistine Chance, MD  [phone]  Please contact Palliative Medicine Team phone at [phone] for questions and concerns.  For individual provider: See Shea Evans

## 2017-08-15 NOTE — NC FL2 (Signed)
[place] LEVEL OF CARE SCREENING TOOL     IDENTIFICATION  Patient Name: Candice Miller Birthdate: 07/28/1924 Sex: female Admission Date (Current Location): 08/12/2017  [facility] and [place] Number:  Herbalist and Address:  [facility],  [place] [address] [place]      Provider Number: [id number]  Attending Physician Name and Address:  Debbe Odea, MD  Relative Name and Phone Number:       Current Level of Care: Hospital Recommended Level of Care: [place] Prior Approval Number:    Date Approved/Denied:   PASRR Number:    Discharge Plan: Home    Current Diagnoses: Patient Active Problem List   Diagnosis Date Noted  . Encounter for palliative care   . Goals of care, counseling/discussion   . Acute urinary retention 08/13/2017  . Frequent falls 08/13/2017  . DM2 (diabetes mellitus, type 2) ([place]) 08/13/2017  . Pressure injury of skin 08/13/2017  . CAP (community acquired pneumonia) 08/12/2017  . Acute respiratory failure with hypoxia ([place]) 06/20/2017  . Accident due to mechanical fall without injury 06/20/2017  . Dementia without behavioral disturbance 06/20/2017  . Confusion 06/20/2017  . Hyperlipidemia associated with type 2 diabetes mellitus ([place]) 06/20/2017  . GERD (gastroesophageal reflux disease) 06/20/2017  . Nodule of middle lobe of right lung 06/20/2017  . COPD with acute exacerbation ([place]) 06/15/2017  . Fall at home, initial encounter 06/15/2017  . COPD (chronic obstructive pulmonary disease) ([place]) 12/31/2016  . History of breast cancer 12/31/2016  . HTN (hypertension) 12/31/2016  . Hemorrhoids 12/31/2016  . Nodule of left lung 12/31/2016  . Lower GI bleed 12/30/2016  . Memory loss 10/22/2012    Orientation RESPIRATION BLADDER Height & Weight     Self  O2 Continent Weight: 185 lb 10 oz (84.2 kg) Height:  5\' 1"  (154.9 cm)  BEHAVIORAL SYMPTOMS/MOOD NEUROLOGICAL BOWEL NUTRITION STATUS   Continent Diet(See Discharge Summary)  AMBULATORY STATUS COMMUNICATION OF NEEDS Skin   Extensive Assist Verbally PU Stage and Appropriate Care([place])                       Personal Care Assistance Level of Assistance  Bathing, Feeding, Dressing Bathing Assistance: Limited assistance Feeding assistance: Independent Dressing Assistance: Limited assistance     Functional Limitations Info  Sight, Hearing, Speech Sight Info: Adequate Hearing Info: Adequate Speech Info: Adequate    SPECIAL CARE FACTORS FREQUENCY  PT (By licensed PT), OT (By licensed OT)     PT Frequency: 3/WEEK OT Frequency: 3X/WEEK            Contractures Contractures Info: Not present    Additional Factors Info  Insulin Sliding Scale Code Status Info: DNR Allergies Info: Allergies: Keflex Cephalexin, Nitrofurantoin, Other, Sulfa Antibiotics   Insulin Sliding Scale Info: 3x's with meal        Current Medications (08/15/2017):  This is the current hospital active medication list Current Facility-Administered Medications  Medication Dose Route Frequency Provider Last Rate Last Dose  . acetaminophen (TYLENOL) tablet 650 mg  650 mg Oral Q6H PRN Reubin Milan, MD   650 mg at 08/14/17 9924   Or  . acetaminophen (TYLENOL) suppository 650 mg  650 mg Rectal Q6H PRN Reubin Milan, MD      . albuterol (PROVENTIL) (2.5 MG/3ML) 0.083% nebulizer solution 2.5 mg  2.5 mg Nebulization Q4H PRN Reubin Milan, MD      . atorvastatin (LIPITOR) tablet 10 mg  10 mg Oral q1800 Reubin Milan, MD   10 mg at 08/14/17 1842  . bisacodyl (DULCOLAX) suppository 10 mg  10 mg Rectal Daily Debbe Odea, MD   10 mg at 08/15/17 1219  . calcium carbonate (OS-CAL - dosed in mg of elemental calcium) tablet 500 mg of elemental calcium  500 mg of elemental calcium Oral Daily Reubin Milan, MD   500 mg of elemental calcium at 08/15/17 0845  . cholecalciferol (VITAMIN D) tablet 1,000 Units  1,000 Units Oral Daily  Reubin Milan, MD   1,000 Units at 08/15/17 [phone]  . diltiazem (CARDIZEM CD) 24 hr capsule 180 mg  180 mg Oral Daily Reubin Milan, MD   180 mg at 08/15/17 0845  . enoxaparin (LOVENOX) injection 40 mg  40 mg Subcutaneous Q24H Reubin Milan, MD   40 mg at 08/14/17 2129  . famotidine (PEPCID) tablet 20 mg  20 mg Oral BID Reubin Milan, MD   20 mg at 08/15/17 0845  . gabapentin (NEURONTIN) capsule 300 mg  300 mg Oral QHS Reubin Milan, MD   300 mg at 08/14/17 2129  . glimepiride (AMARYL) tablet 1 mg  1 mg Oral Q breakfast Reubin Milan, MD   1 mg at 08/15/17 0846  . guaiFENesin (MUCINEX) 12 hr tablet 600 mg  600 mg Oral BID Reubin Milan, MD   600 mg at 08/15/17 0845  . HYDROcodone-acetaminophen (NORCO/VICODIN) 5-325 MG per tablet 1 tablet  1 tablet Oral Q8H PRN Norval Morton, MD   1 tablet at 08/15/17 1354  . insulin aspart (novoLOG) injection 0-9 Units  0-9 Units Subcutaneous TID WC Reubin Milan, MD   2 Units at 08/15/17 1220  . ipratropium-albuterol (DUONEB) 0.5-2.5 (3) MG/3ML nebulizer solution 3 mL  3 mL Nebulization QID Reubin Milan, MD   3 mL at 08/15/17 1051  . iron polysaccharides (NIFEREX) capsule 150 mg  150 mg Oral Daily Reubin Milan, MD   150 mg at 08/15/17 0845  . l-methylfolate-B6-B12 [facility]) 3-35-2 MG per tablet 1 tablet  1 tablet Oral BID Reubin Milan, MD   1 tablet at 08/15/17 [phone]  . levofloxacin (LEVAQUIN) IVPB 750 mg  750 mg Intravenous Q48H Polly Cobia, RPH   Stopped at 08/14/17 2259  . lidocaine (LIDODERM) 5 % 1 patch  1 patch Transdermal Q24H Debbe Odea, MD   1 patch at 08/15/17 0848  . lisinopril (PRINIVIL,ZESTRIL) tablet 10 mg  10 mg Oral Daily Reubin Milan, MD   10 mg at 08/15/17 0845  . loratadine (CLARITIN) tablet 10 mg  10 mg Oral Daily Reubin Milan, MD   10 mg at 08/15/17 0846  . magnesium oxide (MAG-OX) tablet 200 mg  200 mg Oral QHS Reubin Milan, MD   200 mg at 08/14/17  2130  . memantine (NAMENDA) tablet 10 mg  10 mg Oral BID Reubin Milan, MD   10 mg at 08/15/17 0846  . mometasone-formoterol (DULERA) 100-5 MCG/ACT inhaler 2 puff  2 puff Inhalation BID Debbe Odea, MD   2 puff at 08/15/17 1051  . nitroGLYCERIN (NITROSTAT) SL tablet 0.4 mg  0.4 mg Sublingual Q5 min PRN Reubin Milan, MD      . ondansetron [facility]) tablet 4 mg  4 mg Oral Q6H PRN Reubin Milan, MD       Or  . ondansetron [facility]) injection 4 mg  4 mg Intravenous Q6H PRN Tennis Must  Audelia Hives, MD      . polyethylene glycol (MIRALAX / GLYCOLAX) packet 17 g  17 g Oral BID Loistine Chance, MD      . sorbitol, milk of mag, mineral oil, glycerin (SMOG) enema  960 mL Rectal Once PRN Debbe Odea, MD      . sucralfate (CARAFATE) tablet 1 g  1 g Oral BID Reubin Milan, MD   1 g at 08/15/17 0845     Discharge Medications: Please see discharge summary for a list of discharge medications.  Relevant Imaging Results:  Relevant Lab Results:   Additional Information ssn:  Lia Hopping, LCSW

## 2017-08-16 DIAGNOSIS — I471 Supraventricular tachycardia: Secondary | ICD-10-CM

## 2017-08-16 LAB — GLUCOSE, CAPILLARY
GLUCOSE-CAPILLARY: 122 mg/dL — AB (ref 65–99)
GLUCOSE-CAPILLARY: 145 mg/dL — AB (ref 65–99)
Glucose-Capillary: 220 mg/dL — ABNORMAL HIGH (ref 65–99)
Glucose-Capillary: 153 mg/dL — ABNORMAL HIGH (ref 65–99)

## 2017-08-16 MED ORDER — IPRATROPIUM-ALBUTEROL 0.5-2.5 (3) MG/3ML IN SOLN
3.0000 mL | Freq: Two times a day (BID) | RESPIRATORY_TRACT | Status: DC
Start: 2017-08-16 — End: 2017-08-17
  Administered 2017-08-16: 3 mL via RESPIRATORY_TRACT
  Filled 2017-08-16: qty 3

## 2017-08-16 MED ORDER — LEVOFLOXACIN 750 MG PO TABS
750.0000 mg | ORAL_TABLET | ORAL | Status: AC
  Administered 2017-08-16: 750 mg via ORAL
  Filled 2017-08-16: qty 1

## 2017-08-16 MED ORDER — SALINE SPRAY 0.65 % NA SOLN
1.0000 | NASAL | Status: DC | PRN
  Administered 2017-08-16: 1 via NASAL
  Filled 2017-08-16: qty 44

## 2017-08-16 NOTE — Progress Notes (Signed)
PROGRESS NOTE    Candice Miller   ZMO:[id number]  DOB: 11-17-1924  DOA: 08/12/2017 PCP: Lawerance Cruel, MD   Brief Narrative:  Candice Miller is a 84 y/o female who lives at home, uses a walker and has a h/o COPD who is now on 2 L O2 continuously since having RSV and Coronavirus in 3/19.  She also has DM2, HTN, CKD3, frequent falls,  Microcytic anemeia.  She states she was feeling bad and her home nurse noted a rattling in her chest and sent her to the ED.  In the ED she was noted to have a RML infiltrate and was admitted for pneumonia.  She also fell no her bottom on 4/29 and was seen in the ED. Since then she has had pain across her lower back and both hips. Imaging with a head CT without contrast unrevealing. Lumber xray revealed stable old compression deformities of T12 and L1 vertebral bodies and osteopenia   Subjective: Having persistent cough with yellow mucus and some lower back pain.  She had good bowel movement tomorrow but feels like there is Charlot more stool and there and has taken laxatives again today.  Assessment & Plan:   Principal Problem:   CAP (community acquired pneumonia)  - Right mid lung field crackles and RML infiltrate noted- she incidentally had RML nodule on CT in may and 51mo f/u CT was recommended (at her age, I would not pursue this) - cont Levaquin as she cannot recall how severe her allergy to Keflex was-we will change Levaquin to oral today  Active Problems: SVT (new finding) -Has had a few runs of SVT this morning-continue Cardizem -change nebs to as needed oka    COPD with acute exacerbation  -No wheezing noted today- continue duo nebs and Dulera (uses Symbicort at home) -cont to Hold off on steroids  - Urinary retention  - Foley catheter placed overnight on 5/2-  - did well with voiding trial ton 5/3  Constipation - dulcolax suppository helped her have a bowel movement yesterday-she is taking another one today-  Lower back pain/    Frequent falls -Acute secondary to fall on 4/29- imaging in the ER with lumbar x-ray showed old compression fractures of T12 and L1 and no new issues - She has on a lidocaine patch which is being continued - per PT, needs SNF    HTN (hypertension) -Continue Cardizem and lisinopril   Dementia without behavioral disturbance -Namenda    Hyperlipidemia associated with type 2 diabetes mellitus  -Lipitor    GERD (gastroesophageal reflux disease) H2 blocker-Carafate     DM2 (diabetes mellitus, type 2)  -Amaryl continued- low-dose sliding scale ordered-sugars are elevated likely due to steroids which are now on hold- follow sugars without increasing medications    Disposition: Patient and daughter have spoken with palliative care  - they have decided on going to skilled nursing facility with outpatient palliative care follow-up-social worker is trying to find a place  DVT prophylaxis: Lovenox Code Status: DO NOT RESUSCITATE Family Communication:  Disposition Plan:    SNF-social worker consult Consultants:    palliative care   Procedures:   None Antimicrobials:  Anti-infectives (From admission, onward)   Start     Dose/Rate Route Frequency Ordered Stop   08/16/17 2200  levofloxacin (LEVAQUIN) tablet 750 mg     750 mg Oral Every 48 hours 08/16/17 1043 08/18/17 2159   08/14/17 2000  levofloxacin (LEVAQUIN) IVPB 750 mg  Status:  Discontinued  750 mg 100 mL/hr over 90 Minutes Intravenous Every 48 hours 08/12/17 2147 08/16/17 1043   08/13/17 2000  levofloxacin (LEVAQUIN) IVPB 750 mg  Status:  Discontinued     750 mg 100 mL/hr over 90 Minutes Intravenous Every 24 hours 08/12/17 2001 08/12/17 2147   08/12/17 2000  levofloxacin (LEVAQUIN) IVPB 750 mg     750 mg 100 mL/hr over 90 Minutes Intravenous  Once 08/12/17 1945 08/12/17 2142       Objective: Vitals:   08/15/17 2139 08/15/17 2142 08/16/17 0357 08/16/17 0804  BP:   (!) 136/58   Pulse:   75   Resp:   18   Temp:   98.8  F (37.1 C)   TempSrc:   Oral   SpO2: 97% 98% 98% 96%  Weight:      Height:        Intake/Output Summary (Last 24 hours) at 08/16/2017 1239 Last data filed at 08/16/2017 6295 Gross per 24 hour  Intake 720 ml  Output 700 ml  Net 20 ml   Filed Weights   08/12/17 1814 08/12/17 2156  Weight: 83.5 kg (184 lb) 84.2 kg (185 lb 10 oz)    Examination: General exam: Appears comfortable  HEENT: PERRLA, oral mucosa moist, no sclera icterus or thrush Respiratory system: Clear to auscultation bilaterally-no wheezing-respiratory effort normal. Cardiovascular system: S1 & S2 heard, RRR.   Gastrointestinal system: Abdomen soft, non-tender, nondistended. Normal bowel sound. No organomegaly Central nervous system: Alert and oriented. No focal neurological deficits. Extremities: No cyanosis, clubbing or edema Musculoskeletal: Tenderness in lumbar spine Skin: No rashes or ulcers Psychiatry:  Mood & affect appropriate.     Data Reviewed: I have personally reviewed following labs and imaging studies  CBC: Recent Labs  Lab 08/10/17 1105 08/12/17 1851 08/12/17 1902  WBC 11.8* 9.8  --   NEUTROABS 8.8* 6.1  --   HGB 9.0* 9.9* 11.2*  HCT 30.1* 32.8* 33.0*  MCV 78.0 78.8  --   PLT 237 239  --    Basic Metabolic Panel: Recent Labs  Lab 08/10/17 1105 08/12/17 1851 08/12/17 1902 08/12/17 1954  NA 141 140 139  --   K 4.1 4.1 4.0  --   CL 103 100* 98*  --   CO2 29 30  --   --   GLUCOSE 158* 108* 102*  --   BUN 19 21* 20  --   CREATININE 1.04* 0.79 0.80  --   CALCIUM 8.9 9.3  --   --   MG  --   --   --  2.0  PHOS  --   --   --  3.3   GFR: Estimated Creatinine Clearance: 46 mL/min (by C-G formula based on SCr of 0.8 mg/dL). Liver Function Tests: Recent Labs  Lab 08/12/17 1851  AST 24  ALT 13*  ALKPHOS 101  BILITOT 0.4  PROT 7.9  ALBUMIN 3.5   No results for input(s): LIPASE, AMYLASE in the last 168 hours. No results for input(s): AMMONIA in the last 168 hours. Coagulation  Profile: No results for input(s): INR, PROTIME in the last 168 hours. Cardiac Enzymes: Recent Labs  Lab 08/10/17 1105  TROPONINI 0.03*   BNP (last 3 results) No results for input(s): PROBNP in the last 8760 hours. HbA1C: No results for input(s): HGBA1C in the last 72 hours. CBG: Recent Labs  Lab 08/15/17 1143 08/15/17 1636 08/15/17 2121 08/16/17 0813 08/16/17 1119  GLUCAP 181* 231* 179* 122* 220*   Lipid  Profile: No results for input(s): CHOL, HDL, LDLCALC, TRIG, CHOLHDL, LDLDIRECT in the last 72 hours. Thyroid Function Tests: No results for input(s): TSH, T4TOTAL, FREET4, T3FREE, THYROIDAB in the last 72 hours. Anemia Panel: No results for input(s): VITAMINB12, FOLATE, FERRITIN, TIBC, IRON, RETICCTPCT in the last 72 hours. Urine analysis:    Component Value Date/Time   COLORURINE YELLOW 08/12/2017 1904   APPEARANCEUR CLEAR 08/12/2017 1904   LABSPEC 1.017 08/12/2017 1904   PHURINE 7.0 08/12/2017 1904   GLUCOSEU 150 (A) 08/12/2017 1904   HGBUR NEGATIVE 08/12/2017 1904   BILIRUBINUR NEGATIVE 08/12/2017 1904   KETONESUR NEGATIVE 08/12/2017 1904   PROTEINUR NEGATIVE 08/12/2017 1904   NITRITE NEGATIVE 08/12/2017 1904   LEUKOCYTESUR NEGATIVE 08/12/2017 1904   Sepsis Labs: @LABRCNTIP (procalcitonin:4,lacticidven:4) ) Recent Results (from the past 240 hour(s))  Blood Culture (routine x 2)     Status: None (Preliminary result)   Collection Time: 08/12/17  6:51 PM  Result Value Ref Range Status   Specimen Description   Final    BLOOD RIGHT FOREARM Performed at [facility], [place] [address], [place], [place] [address]    Special Requests   Final    BOTTLES DRAWN AEROBIC AND ANAEROBIC Blood Culture adequate volume Performed at [place] [address], [place], [place] [address]    Culture   Final    NO GROWTH 4 DAYS Performed at [facility], [place] [address], [place], [place] [address]    Report Status PENDING  Incomplete   Blood Culture (routine x 2)     Status: None (Preliminary result)   Collection Time: 08/12/17  6:52 PM  Result Value Ref Range Status   Specimen Description   Final    BLOOD RIGHT HAND Performed at [place] [address], [place], [place] [address]    Special Requests   Final    BOTTLES DRAWN AEROBIC AND ANAEROBIC Blood Culture results may not be optimal due to an inadequate volume of blood received in culture bottles Performed at [place] [address] [place], [place] [address]    Culture   Final    NO GROWTH 4 DAYS Performed at [facility], [place] [address], [place], [place] [address]    Report Status PENDING  Incomplete  Urine culture     Status: None   Collection Time: 08/12/17  7:04 PM  Result Value Ref Range Status   Specimen Description   Final    URINE, CATHETERIZED Performed at [place] [address] [place], [place] [address]    Special Requests   Final    NONE Performed at [facility], [place] [address], [place], [place] [address]    Culture   Final    NO GROWTH Performed at [facility], [place] [address] [place],  [address]    Report Status 08/14/2017 FINAL  Final         Radiology Studies: No results found.    Scheduled Meds: . atorvastatin  10 mg Oral q1800  . bisacodyl  10 mg Rectal Daily  . calcium carbonate  500 mg of elemental calcium Oral Daily  . cholecalciferol  1,000 Units Oral Daily  . diltiazem  180 mg Oral Daily  . enoxaparin (LOVENOX) injection  40 mg Subcutaneous Q24H  . famotidine  20 mg Oral BID  . gabapentin  300 mg Oral QHS  . glimepiride  1 mg Oral Q breakfast  .  guaiFENesin  600 mg Oral BID  . insulin aspart  0-9 Units Subcutaneous TID WC  . ipratropium-albuterol  3 mL Nebulization BID  . iron polysaccharides  150 mg Oral Daily  . l-methylfolate-B6-B12  1 tablet Oral BID  . levofloxacin  750 mg Oral Q48H  .  lidocaine  1 patch Transdermal Q24H  . lisinopril  10 mg Oral Daily  . loratadine  10 mg Oral Daily  . magnesium oxide  200 mg Oral QHS  . memantine  10 mg Oral BID  . mometasone-formoterol  2 puff Inhalation BID  . polyethylene glycol  17 g Oral BID  . sucralfate  1 g Oral BID   Continuous Infusions:    LOS: 4 days    Time spent in minutes: 35    Debbe Odea, MD [facility] Pager: [email or web address] Password [facility] 08/16/2017, 12:39 PM

## 2017-08-16 NOTE — Progress Notes (Addendum)
11:57 am [facility] will likely have bed available tomorrow. MD paged. CSW to follow and support with discharge.  10:39 am CSW met with patient at bedside and spoke to daughter, [name], via phone. Patient and daughter with strong preference for [facility]. CSW informed patient and daughter that they have alternate SNF offers. Patient and daughter refusing other beds, will only accept [facility]. Patient and daughter aware no bed available at [facility] today.   CSW informed daughter that patient medically ready today, per MD. Daughter stated she will take patient home if she has to, but patient will need an ambulance home. Daughter hopeful to admit patient to [facility] from home if discharged before bed available.  CSW awaiting call back from [facility] to confirm when bed will be available. MD aware.  Estanislado Emms, Learned

## 2017-08-16 NOTE — Progress Notes (Signed)
PHARMACIST - PHYSICIAN COMMUNICATION DR:   Wynelle [place] CONCERNING: Antibiotic IV to Oral Route Change Policy  RECOMMENDATION: This patient is receiving levofloxacin by the intravenous route.  Based on criteria approved by the Pharmacy and Therapeutics Committee, the antibiotic(s) is/are being converted to the equivalent oral dose form(s).   DESCRIPTION: These criteria include:  Patient being treated for a respiratory tract infection, urinary tract infection, cellulitis or clostridium difficile associated diarrhea if on metronidazole  The patient is not neutropenic and does not exhibit a GI malabsorption state  The patient is eating (either orally or via tube) and/or has been taking other orally administered medications for a least 24 hours  The patient is improving clinically and has a Tmax < 100.5  If you have questions about this conversion, please contact the Pharmacy Department  []   [phone] )  [facility] []   [phone] )  [facility] []   [phone] )  [facility] []   [phone] )  [facility] [x]   [phone] )  [place], PharmD, BCPS.   08/16/2017 10:44 AM

## 2017-08-17 DIAGNOSIS — K219 Gastro-esophageal reflux disease without esophagitis: Secondary | ICD-10-CM | POA: Diagnosis not present

## 2017-08-17 DIAGNOSIS — R600 Localized edema: Secondary | ICD-10-CM | POA: Diagnosis not present

## 2017-08-17 DIAGNOSIS — G301 Alzheimer's disease with late onset: Secondary | ICD-10-CM | POA: Diagnosis not present

## 2017-08-17 DIAGNOSIS — E1169 Type 2 diabetes mellitus with other specified complication: Secondary | ICD-10-CM | POA: Diagnosis not present

## 2017-08-17 DIAGNOSIS — I471 Supraventricular tachycardia: Secondary | ICD-10-CM | POA: Diagnosis not present

## 2017-08-17 DIAGNOSIS — E785 Hyperlipidemia, unspecified: Secondary | ICD-10-CM | POA: Diagnosis not present

## 2017-08-17 DIAGNOSIS — M545 Low back pain: Secondary | ICD-10-CM

## 2017-08-17 DIAGNOSIS — M549 Dorsalgia, unspecified: Secondary | ICD-10-CM

## 2017-08-17 DIAGNOSIS — M6281 Muscle weakness (generalized): Secondary | ICD-10-CM | POA: Diagnosis not present

## 2017-08-17 DIAGNOSIS — Z515 Encounter for palliative care: Secondary | ICD-10-CM | POA: Diagnosis not present

## 2017-08-17 DIAGNOSIS — J441 Chronic obstructive pulmonary disease with (acute) exacerbation: Secondary | ICD-10-CM | POA: Diagnosis not present

## 2017-08-17 DIAGNOSIS — Z743 Need for continuous supervision: Secondary | ICD-10-CM | POA: Diagnosis not present

## 2017-08-17 DIAGNOSIS — G8929 Other chronic pain: Secondary | ICD-10-CM | POA: Diagnosis not present

## 2017-08-17 DIAGNOSIS — F0280 Dementia in other diseases classified elsewhere without behavioral disturbance: Secondary | ICD-10-CM | POA: Diagnosis not present

## 2017-08-17 DIAGNOSIS — R296 Repeated falls: Secondary | ICD-10-CM | POA: Diagnosis not present

## 2017-08-17 DIAGNOSIS — R2681 Unsteadiness on feet: Secondary | ICD-10-CM | POA: Diagnosis not present

## 2017-08-17 DIAGNOSIS — I129 Hypertensive chronic kidney disease with stage 1 through stage 4 chronic kidney disease, or unspecified chronic kidney disease: Secondary | ICD-10-CM | POA: Diagnosis not present

## 2017-08-17 DIAGNOSIS — I10 Essential (primary) hypertension: Secondary | ICD-10-CM | POA: Diagnosis not present

## 2017-08-17 DIAGNOSIS — N183 Chronic kidney disease, stage 3 (moderate): Secondary | ICD-10-CM | POA: Diagnosis not present

## 2017-08-17 DIAGNOSIS — E119 Type 2 diabetes mellitus without complications: Secondary | ICD-10-CM | POA: Diagnosis not present

## 2017-08-17 DIAGNOSIS — R488 Other symbolic dysfunctions: Secondary | ICD-10-CM | POA: Diagnosis not present

## 2017-08-17 DIAGNOSIS — Z9181 History of falling: Secondary | ICD-10-CM | POA: Diagnosis not present

## 2017-08-17 DIAGNOSIS — J189 Pneumonia, unspecified organism: Secondary | ICD-10-CM | POA: Diagnosis not present

## 2017-08-17 DIAGNOSIS — R279 Unspecified lack of coordination: Secondary | ICD-10-CM | POA: Diagnosis not present

## 2017-08-17 DIAGNOSIS — E118 Type 2 diabetes mellitus with unspecified complications: Secondary | ICD-10-CM | POA: Diagnosis not present

## 2017-08-17 DIAGNOSIS — R338 Other retention of urine: Secondary | ICD-10-CM | POA: Diagnosis not present

## 2017-08-17 DIAGNOSIS — J181 Lobar pneumonia, unspecified organism: Secondary | ICD-10-CM | POA: Diagnosis not present

## 2017-08-17 LAB — CULTURE, BLOOD (ROUTINE X 2)
CULTURE: NO GROWTH
Culture: NO GROWTH
Special Requests: ADEQUATE

## 2017-08-17 LAB — BASIC METABOLIC PANEL
Anion gap: 10 (ref 5–15)
BUN: 30 mg/dL — AB (ref 6–20)
CALCIUM: 9 mg/dL (ref 8.9–10.3)
CO2: 29 mmol/L (ref 22–32)
CREATININE: 0.95 mg/dL (ref 0.44–1.00)
Chloride: 97 mmol/L — ABNORMAL LOW (ref 101–111)
GFR calc Af Amer: 59 mL/min — ABNORMAL LOW (ref >60)
GFR, EST NON AFRICAN AMERICAN: 51 mL/min — AB (ref >60)
GLUCOSE: 135 mg/dL — AB (ref 65–99)
POTASSIUM: 4.5 mmol/L (ref 3.5–5.1)
SODIUM: 136 mmol/L (ref 135–145)

## 2017-08-17 LAB — GLUCOSE, CAPILLARY
Glucose-Capillary: 108 mg/dL — ABNORMAL HIGH (ref 65–99)
Glucose-Capillary: 182 mg/dL — ABNORMAL HIGH (ref 65–99)

## 2017-08-17 MED ORDER — GUAIFENESIN ER 600 MG PO TB12: 600.0000 mg | ORAL_TABLET | Freq: Two times a day (BID) | ORAL | Status: DC

## 2017-08-17 MED ORDER — LIDOCAINE 5 % EX PTCH: 1.0000 | MEDICATED_PATCH | CUTANEOUS | 0 refills | Status: DC

## 2017-08-17 MED ORDER — IPRATROPIUM-ALBUTEROL 0.5-2.5 (3) MG/3ML IN SOLN
3.0000 mL | Freq: Four times a day (QID) | RESPIRATORY_TRACT | Status: DC | PRN
Start: 2017-08-17 — End: 2017-08-17

## 2017-08-17 MED ORDER — SORBITOL 70 % SOLN: 960.0000 mL | TOPICAL_OIL | Freq: Once | ORAL | Status: DC | PRN

## 2017-08-17 MED ORDER — ONDANSETRON HCL 4 MG PO TABS: 4.0000 mg | ORAL_TABLET | Freq: Four times a day (QID) | ORAL | 0 refills | Status: DC | PRN

## 2017-08-17 MED ORDER — SALINE SPRAY 0.65 % NA SOLN: 1.0000 | NASAL | 0 refills | Status: DC | PRN

## 2017-08-17 MED ORDER — LEVOFLOXACIN 750 MG PO TABS: 750.0000 mg | ORAL_TABLET | Freq: Once | ORAL | 0 refills | Status: AC

## 2017-08-17 MED ORDER — LEVOFLOXACIN 750 MG PO TABS: 750.0000 mg | ORAL_TABLET | Freq: Once | ORAL | Status: DC

## 2017-08-17 NOTE — Discharge Summary (Signed)
Physician Discharge Summary  Candice Miller DUK:[id number] DOB: Nov 29, 1924 DOA: 08/12/2017  PCP: Lawerance Cruel, MD  Admit date: 08/12/2017 Discharge date: 08/17/2017  Admitted From: home Disposition:  SNF   Recommendations for Outpatient Follow-up:  1. Last dose of Levaquin due tomorrow-  2. Repeat CXR in 1-2 wks please 3. Will need palliative care following at SNF Discharge Condition:  stable   CODE STATUS:  DNR   Consultations:  none    Discharge Diagnoses:  Principal Problem:   CAP (community acquired pneumonia) Active Problems:   COPD with acute exacerbation ([place])   Acute urinary retention   Frequent falls   Paroxysmal SVT (supraventricular tachycardia) (HCC)   Chronic back pain   HTN (hypertension)   Dementia without behavioral disturbance   Hyperlipidemia associated with type 2 diabetes mellitus (HCC)   GERD (gastroesophageal reflux disease)   DM2 (diabetes mellitus, type 2) (HCC)   Pressure injury of skin   Encounter for palliative care   Goals of care, counseling/discussion      Brief Summary: Candice Miller is a 84 y/o female who lives at home, uses a walker and has a h/o COPD who is now on 2 L O2 continuously since having RSV and Coronavirus in 3/19.  She also has DM2, HTN, CKD3, frequent falls,  Microcytic anemeia.  She states she was feeling bad and her home nurse noted a rattling in her chest and sent her to the ED.  In the ED she was noted to have a RML infiltrate and was admitted for pneumonia.  She also fell no her bottom on 4/29 and was seen in the ED. Since then she has had pain across her lower back and both hips. Imaging with a head CT without contrast unrevealing. Lumber xray revealed stable old compression deformities of T12 and L1 vertebral bodies and osteopenia  Hospital Course:  Principal Problem:   CAP (community acquired pneumonia)  - Right mid lung field crackles and RML infiltrate noted- she incidentally had RML nodule on CT in may and  35mo f/u CT was recommended (at her age, I would not pursue this) - cont on Levaquin as she cannot recall how severe her allergy to Keflex was- she has not had any confusion with it- it is being dosed every 48 hrs per pharmacy and last dose is tomorrow-  Active Problems: SVT (new finding) -Has had a couple runs of SVT on 5/2 and Cardizem was resumed- he then had 1 run on 5/5 and I changed nebs to PRN as her respiratory status had improved-  SVT has subsequently resolved    COPD with acute exacerbation  - 5/2 No wheezing noted today- uses Symbicort and albuterol inhaler at home    - Urinary retention  - Foley catheter placed overnight on 5/2-  - did well with voiding trial on 5/3  Constipation - resolved with laxatives   Lower back pain/   Frequent falls -Acute secondary to fall on 4/29- imaging in the ER with lumbar x-ray showed old compression fractures of T12 and L1 and no new issues - She has on a lidocaine patch which is being continued - per PT, needs SNF    HTN (hypertension) -Continue Cardizem and lisinopril   Dementia without behavioral disturbance -Namenda    Hyperlipidemia associated with type 2 diabetes mellitus  -Lipitor    GERD (gastroesophageal reflux disease) H2 blocker-Carafate     DM2 (diabetes mellitus, type 2)  -Amaryl continued- low-dose sliding scale ordered-sugars were elevated likely due  to steroids given on day of admission and have improved  CBG (last 3)  Recent Labs    08/16/17 1638 08/16/17 2125 08/17/17 0751  GLUCAP 153* 145* 108*    Disposition: Patient and daughter have spoken with palliative care  - they have decided on going to skilled nursing facility with outpatient palliative care following-    Discharge Exam: Vitals:   08/16/17 2123 08/17/17 0533  BP: (!) 116/39 (!) 124/49  Pulse: 85 72  Resp: 18 18  Temp: 98.5 F (36.9 C) 98.5 F (36.9 C)  SpO2: 96% 99%   Vitals:   08/16/17 1934 08/16/17 1936 08/16/17 2123  08/17/17 0533  BP:   (!) 116/39 (!) 124/49  Pulse:   85 72  Resp:   18 18  Temp:   98.5 F (36.9 C) 98.5 F (36.9 C)  TempSrc:   Oral Oral  SpO2: 98% 98% 96% 99%  Weight:      Height:        General: Pt is alert, awake, not in acute distress Cardiovascular: RRR, S1/S2 +, no rubs, no gallops Respiratory: mild rhonchi bilaterally Abdominal: Soft, NT, ND, bowel sounds + Extremities: no edema, no cyanosis   Discharge Instructions  Discharge Instructions    Diet - low sodium heart healthy   Complete by:  As directed    Diet Carb Modified   Complete by:  As directed    Increase activity slowly   Complete by:  As directed    Increase activity slowly   Complete by:  As directed      Allergies as of 08/17/2017      Reactions   Keflex [cephalexin]    Nitrofurantoin    Other    Pt reports "microdentin"   Sulfa Antibiotics       Medication List    TAKE these medications   acetaminophen 650 MG CR tablet Commonly known as:  TYLENOL 8 HOUR Take 1 tablet (650 mg total) by mouth every 8 (eight) hours as needed. What changed:  reasons to take this   albuterol 108 (90 Base) MCG/ACT inhaler Commonly known as:  PROVENTIL HFA;VENTOLIN HFA Inhale 2 puffs into the lungs every 6 (six) hours as needed for wheezing.   atorvastatin 10 MG tablet Commonly known as:  LIPITOR Take 10 mg by mouth daily.   budesonide-formoterol 80-4.5 MCG/ACT inhaler Commonly known as:  SYMBICORT Inhale 2 puffs into the lungs 2 (two) times daily.   calcium gluconate 500 MG tablet Take 1 tablet by mouth daily.   cholecalciferol 1000 units tablet Commonly known as:  VITAMIN D Take 1,000 Units by mouth daily.   diltiazem 180 MG 24 hr capsule Commonly known as:  CARDIZEM CD Take 180 mg by mouth daily.   fexofenadine 180 MG tablet Commonly known as:  ALLEGRA Take 180 mg by mouth daily.   Fish Oil 1000 MG Caps Take 1,000 mg by mouth daily.   fluticasone 50 MCG/ACT nasal spray Commonly known as:   FLONASE Place 2 sprays into both nostrils daily.   gabapentin 300 MG capsule Commonly known as:  NEURONTIN Take 300 mg by mouth at bedtime.   glimepiride 1 MG tablet Commonly known as:  AMARYL Take 1 mg by mouth. Only takes this medication if blood sugars>150 per provider   guaiFENesin 600 MG 12 hr tablet Commonly known as:  MUCINEX Take 1 tablet (600 mg total) by mouth 2 (two) times daily.   ipratropium-albuterol 0.5-2.5 (3) MG/3ML Soln Commonly known as:  DUONEB Take 3 mLs by nebulization 4 (four) times daily.   iron polysaccharides 150 MG capsule Commonly known as:  NIFEREX Take 150 mg by mouth daily.   l-methylfolate-B6-B12 3-35-2 MG Tabs tablet Commonly known as:  METANX Take 1 tablet by mouth 2 (two) times daily.   levofloxacin 750 MG tablet Commonly known as:  LEVAQUIN Take 1 tablet (750 mg total) by mouth once for 1 dose. Start taking on:  08/18/2017   lidocaine 5 % Commonly known as:  LIDODERM Place 1 patch onto the skin daily. Remove & Discard patch within 12 hours or as directed by MD   lisinopril 10 MG tablet Commonly known as:  PRINIVIL,ZESTRIL Take 10 mg by mouth daily.   Magnesium 250 MG Tabs Take 250 mg by mouth at bedtime.   memantine 10 MG tablet Commonly known as:  NAMENDA Take 10 mg by mouth 2 (two) times daily.   nitroGLYCERIN 0.4 MG SL tablet Commonly known as:  NITROSTAT Place 0.4 mg under the tongue every 5 (five) minutes as needed for chest pain.   ondansetron 4 MG tablet Commonly known as:  ZOFRAN Take 1 tablet (4 mg total) by mouth every 6 (six) hours as needed for nausea.   polyethylene glycol packet Commonly known as:  MIRALAX / GLYCOLAX Take 17 g by mouth daily.   ranitidine 150 MG tablet Commonly known as:  ZANTAC Take 150 mg by mouth 2 (two) times daily.   sodium chloride 0.65 % Soln nasal spray Commonly known as:  OCEAN Place 1 spray into both nostrils as needed for congestion.   sorbitol-magnesium hydroxide-mineral  oil-glycerin Place 960 mLs rectally once as needed (give today if Dulcolax does no result in a good BM).   sucralfate 1 g tablet Commonly known as:  CARAFATE Take 1 g by mouth 2 (two) times daily.       Allergies  Allergen Reactions  . Keflex [Cephalexin]   . Nitrofurantoin   . Other     Pt reports "microdentin"  . Sulfa Antibiotics      Procedures/Studies:    Dg Chest 2 View  Result Date: 08/12/2017 CLINICAL DATA:  Loss of appetite, fever EXAM: CHEST - 2 VIEW COMPARISON:  06/17/2017 FINDINGS: Increased interstitial markings. Mild patchy opacity in the right mid lung, raising the possibility of pneumonia. No pleural effusion or pneumothorax. The heart is normal in size. Degenerative changes of the visualized thoracolumbar spine. Moderate compression fracture of a midthoracic vertebral body. Mild superior endplate compression fracture of a upper lumbar vertebral body. These findings are unchanged. IMPRESSION: Mild patchy opacity in the right mid lung, raising the possibility of pneumonia. Electronically Signed   By: Julian Hy M.D.   On: 08/12/2017 18:43   Dg Lumbar Spine Complete  Result Date: 08/10/2017 CLINICAL DATA:  Acute low back pain fall at home. EXAM: LUMBAR SPINE - COMPLETE 4+ VIEW COMPARISON:  CT scan of June 15, 2017. FINDINGS: Stable old compression deformities of T12 and L1 vertebral bodies. No acute fracture or spondylolisthesis is noted. Atherosclerosis of abdominal aorta is noted. Mild degenerative disc disease is noted at T12-L1 and L1-2 with anterior osteophyte formation. Diffuse osteopenia is noted. IMPRESSION: No acute abnormality seen in the lumbar spine. Electronically Signed   By: Marijo Conception, M.D.   On: 08/10/2017 09:19   Ct Head Wo Contrast  Result Date: 08/10/2017 CLINICAL DATA:  Fall.  Patient denies hitting her head. EXAM: CT HEAD WITHOUT CONTRAST TECHNIQUE: Contiguous axial images were obtained from  the base of the skull through the vertex  without intravenous contrast. COMPARISON:  None. FINDINGS: Brain: No evidence of acute infarction, hemorrhage, hydrocephalus, extra-axial collection or mass lesion/mass effect. Mild atrophy. 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 Lacunar infarcts in the bilateral basal ganglia. Moderate periventricular and subcortical white matter hypodensities are nonspecific, but favored to reflect chronic microvascular ischemic changes. Vascular: Calcified atherosclerosis at the skullbase. No hyperdense vessel. Skull: Negative for fracture or focal lesion. Sinuses/Orbits: Opacification of the underdeveloped right mastoid air cells. Left mastoid air cells are clear. Frothy secretions within the left sphenoid sinus. No acute orbital abnormality. Other: None. IMPRESSION: 1. No acute intracranial abnormality. Chronic changes as described above. 2. Right mastoid effusion. Electronically Signed   By: Titus Dubin M.D.   On: 08/10/2017 09:35   Dg Hip Unilat W Or Wo Pelvis 2-3 Views Right  Result Date: 08/10/2017 CLINICAL DATA:  Right hip pain after unwitnessed fall at home today EXAM: DG HIP (WITH OR WITHOUT PELVIS) 2-3V RIGHT COMPARISON:  Radiographs of June 15, 2017. FINDINGS: There is no evidence of hip fracture or dislocation. There is no evidence of arthropathy or other focal bone abnormality. IMPRESSION: Normal right hip. Electronically Signed   By: Marijo Conception, M.D.   On: 08/10/2017 09:16     The results of significant diagnostics from this hospitalization (including imaging, microbiology, ancillary and laboratory) are listed below for reference.     Microbiology: Recent Results (from the past 240 hour(s))  Blood Culture (routine x 2)     Status: None   Collection Time: 08/12/17  6:51 PM  Result Value Ref Range Status   Specimen Description   Final    BLOOD RIGHT FOREARM Performed at [facility] [address], [place], [place] [address]    Special Requests   Final    BOTTLES DRAWN  AEROBIC AND ANAEROBIC Blood Culture adequate volume Performed at [facility] [address] [place], [place] [address]    Culture   Final    NO GROWTH 5 DAYS Performed at [facility], [place] [address] [place], [place] [address]    Report Status 08/17/2017 FINAL  Final  Blood Culture (routine x 2)     Status: None   Collection Time: 08/12/17  6:52 PM  Result Value Ref Range Status   Specimen Description   Final    BLOOD RIGHT HAND Performed at [facility] [address] [place], [place] [address]    Special Requests   Final    BOTTLES DRAWN AEROBIC AND ANAEROBIC Blood Culture results may not be optimal due to an inadequate volume of blood received in culture bottles Performed at [facility] [address], [place], [place] [address]    Culture   Final    NO GROWTH 5 DAYS Performed at [facility], [place] [address] [place], [place] [address]    Report Status 08/17/2017 FINAL  Final  Urine culture     Status: None   Collection Time: 08/12/17  7:04 PM  Result Value Ref Range Status   Specimen Description   Final    URINE, CATHETERIZED Performed at [facility] [address], [place], [place] [address]    Special Requests   Final    NONE Performed at [facility], [place] [address] [place], [place] [address]    Culture   Final    NO GROWTH Performed at [facility], [place] [address] [place], [place] [address]    Report Status 08/14/2017 FINAL  Final     Labs: BNP (last 3 results) Recent Labs    08/12/17 1851  BNP 60.6   Basic Metabolic Panel: Recent Labs  Lab 08/12/17 1851 08/12/17 1902 08/12/17 1954 08/17/17 0849  NA 140 139  --  136  K 4.1 4.0  --  4.5  CL 100* 98*  --  97*  CO2 30  --   --  29  GLUCOSE 108* 102*  --  135*  BUN 21* 20  --  30*  CREATININE 0.79 0.80  --  0.95  CALCIUM 9.3  --   --  9.0  MG  --   --  2.0  --   PHOS  --   --   3.3  --    Liver Function Tests: Recent Labs  Lab 08/12/17 1851  AST 24  ALT 13*  ALKPHOS 101  BILITOT 0.4  PROT 7.9  ALBUMIN 3.5   No results for input(s): LIPASE, AMYLASE in the last 168 hours. No results for input(s): AMMONIA in the last 168 hours. CBC: Recent Labs  Lab 08/12/17 1851 08/12/17 1902  WBC 9.8  --   NEUTROABS 6.1  --   HGB 9.9* 11.2*  HCT 32.8* 33.0*  MCV 78.8  --   PLT 239  --    Cardiac Enzymes: No results for input(s): CKTOTAL, CKMB, CKMBINDEX, TROPONINI in the last 168 hours. BNP: Invalid input(s): POCBNP CBG: Recent Labs  Lab 08/16/17 0813 08/16/17 1119 08/16/17 1638 08/16/17 2125 08/17/17 0751  GLUCAP 122* 220* 153* 145* 108*   D-Dimer No results for input(s): DDIMER in the last 72 hours. Hgb A1c No results for input(s): HGBA1C in the last 72 hours. Lipid Profile No results for input(s): CHOL, HDL, LDLCALC, TRIG, CHOLHDL, LDLDIRECT in the last 72 hours. Thyroid function studies No results for input(s): TSH, T4TOTAL, T3FREE, THYROIDAB in the last 72 hours.  Invalid input(s): FREET3 Anemia work up No results for input(s): VITAMINB12, FOLATE, FERRITIN, TIBC, IRON, RETICCTPCT in the last 72 hours. Urinalysis    Component Value Date/Time   COLORURINE YELLOW 08/12/2017 1904   APPEARANCEUR CLEAR 08/12/2017 1904   LABSPEC 1.017 08/12/2017 1904   PHURINE 7.0 08/12/2017 1904   GLUCOSEU 150 (A) 08/12/2017 1904   HGBUR NEGATIVE 08/12/2017 1904   BILIRUBINUR NEGATIVE 08/12/2017 1904   KETONESUR NEGATIVE 08/12/2017 1904   PROTEINUR NEGATIVE 08/12/2017 1904   NITRITE NEGATIVE 08/12/2017 1904   LEUKOCYTESUR NEGATIVE 08/12/2017 1904   Sepsis Labs Invalid input(s): PROCALCITONIN,  WBC,  LACTICIDVEN Microbiology Recent Results (from the past 240 hour(s))  Blood Culture (routine x 2)     Status: None   Collection Time: 08/12/17  6:51 PM  Result Value Ref Range Status   Specimen Description   Final    BLOOD RIGHT FOREARM Performed at  [facility], [place] [address] [place], [place] [address]    Special Requests   Final    BOTTLES DRAWN AEROBIC AND ANAEROBIC Blood Culture adequate volume Performed at [facility] [address], [place], [place] [address]    Culture   Final    NO GROWTH 5 DAYS Performed at [facility], [place] [address], [place], [place] [address]    Report Status 08/17/2017 FINAL  Final  Blood Culture (routine x 2)     Status: None   Collection Time: 08/12/17  6:52 PM  Result  Value Ref Range Status   Specimen Description   Final    BLOOD RIGHT HAND Performed at [facility] [address], [place], [place] [address]    Special Requests   Final    BOTTLES DRAWN AEROBIC AND ANAEROBIC Blood Culture results may not be optimal due to an inadequate volume of blood received in culture bottles Performed at [facility] [address] [place], [place] [address]    Culture   Final    NO GROWTH 5 DAYS Performed at [facility], [place] [address] [place], [place] [address]    Report Status 08/17/2017 FINAL  Final  Urine culture     Status: None   Collection Time: 08/12/17  7:04 PM  Result Value Ref Range Status   Specimen Description   Final    URINE, CATHETERIZED Performed at [facility] [address], [place], [place] [address]    Special Requests   Final    NONE Performed at [facility], [place] [address] [place],  [address]    Culture   Final    NO GROWTH Performed at [facility], [place] [address] [place],  [address]    Report Status 08/14/2017 FINAL  Final     Time coordinating discharge in minutes: 65  SIGNED:   Debbe Odea, MD  [facility] 08/17/2017, 11:31 AM Pager   If 7PM-7AM, please contact night-coverage [email or web address] Password [email or web address]

## 2017-08-17 NOTE — Progress Notes (Signed)
Report called to [place] at [facility].  Awaiting [facility] transport. Andre Lefort

## 2017-08-17 NOTE — Clinical Social Work Placement (Signed)
Patient received and accepted bed offer at [facility]. Facility aware of patient's discharge and confirmed bed offer. [facility] contacted, patient's family notified. Patient's RN provided with number to call report, packet complete. CSW signing off, no other needs identified at this time.   CLINICAL SOCIAL WORK PLACEMENT  NOTE  Date:  08/17/2017  Patient Details  Name: Candice Miller MRN: [id number] Date of Birth: 1925/03/09  Clinical Social Work is seeking post-discharge placement for this patient at the [place] level of care (*CSW will initial, date and re-position this form in  chart as items are completed):  Yes   Patient/family provided with [facility] Work Department's list of facilities offering this level of care within the geographic area requested by the patient (or if unable, by the patient's family).  Yes   Patient/family informed of their freedom to choose among providers that offer the needed level of care, that participate in Medicare, Medicaid or managed care program needed by the patient, have an available bed and are willing to accept the patient.  Yes   Patient/family informed of [facility]'s ownership interest in [facility] and [facility], as well as of the fact that they are under no obligation to receive care at these facilities.  PASRR submitted to EDS on       PASRR number received on 08/15/17     Existing PASRR number confirmed on       FL2 transmitted to all facilities in geographic area requested by pt/family on 08/15/17     FL2 transmitted to all facilities within larger geographic area on       Patient informed that his/her managed care company has contracts with or will negotiate with certain facilities, including the following:        Yes   Patient/family informed of bed offers received.  Patient chooses bed at [facility]     Physician recommends and patient chooses bed at      Patient to be  transferred to [facility] on 08/17/17.  Patient to be transferred to facility by PTAR     Patient family notified on 08/17/17 of transfer.  Name of family member notified:  [name]     PHYSICIAN       Additional Comment:    _______________________________________________ Burnis Medin, LCSW 08/17/2017, 1:32 PM

## 2017-08-18 ENCOUNTER — Non-Acute Institutional Stay (SKILLED_NURSING_FACILITY): Payer: Medicare Other | Admitting: Internal Medicine

## 2017-08-18 ENCOUNTER — Encounter: Payer: Self-pay | Admitting: Internal Medicine

## 2017-08-18 DIAGNOSIS — R296 Repeated falls: Secondary | ICD-10-CM

## 2017-08-18 DIAGNOSIS — F0280 Dementia in other diseases classified elsewhere without behavioral disturbance: Secondary | ICD-10-CM

## 2017-08-18 DIAGNOSIS — G8929 Other chronic pain: Secondary | ICD-10-CM

## 2017-08-18 DIAGNOSIS — E1169 Type 2 diabetes mellitus with other specified complication: Secondary | ICD-10-CM | POA: Diagnosis not present

## 2017-08-18 DIAGNOSIS — J189 Pneumonia, unspecified organism: Secondary | ICD-10-CM

## 2017-08-18 DIAGNOSIS — M545 Low back pain: Secondary | ICD-10-CM | POA: Diagnosis not present

## 2017-08-18 DIAGNOSIS — I471 Supraventricular tachycardia: Secondary | ICD-10-CM | POA: Diagnosis not present

## 2017-08-18 DIAGNOSIS — R338 Other retention of urine: Secondary | ICD-10-CM | POA: Diagnosis not present

## 2017-08-18 DIAGNOSIS — I10 Essential (primary) hypertension: Secondary | ICD-10-CM

## 2017-08-18 DIAGNOSIS — G301 Alzheimer's disease with late onset: Secondary | ICD-10-CM

## 2017-08-18 DIAGNOSIS — E785 Hyperlipidemia, unspecified: Secondary | ICD-10-CM

## 2017-08-18 DIAGNOSIS — J181 Lobar pneumonia, unspecified organism: Secondary | ICD-10-CM

## 2017-08-18 DIAGNOSIS — J441 Chronic obstructive pulmonary disease with (acute) exacerbation: Secondary | ICD-10-CM | POA: Diagnosis not present

## 2017-08-18 NOTE — Progress Notes (Signed)
: Provider:  Noah Delaine. Sheppard Coil, MD Location:  [facility] Room Number: [phone] Place of Service:  SNF ([id number])  PCP: Lawerance Cruel, MD Patient Care Team: Lawerance Cruel, MD as PCP - General (Family Medicine)  Extended Emergency Contact Information Primary Emergency Contact: Hinda Glatter of [place] Phone: [phone] Relation: Daughter     Allergies: Keflex [cephalexin]; Nitrofurantoin; Other; and Sulfa antibiotics  Chief Complaint  Patient presents with  . New Admit To SNF    HPI: Patient is 84 y.o. female with anemia, history of breast cancer, COPD, type 2 diabetes, hypertension, normal pressure hydrocephalus, and osteoporosis, who was brought to the emergency department due to decreased appetite, fever, chills, fatigue, malaise, hypersomnolence, productive cough, and pleuritic chest pain.  She denied headache rhinorrhea sore throat palpitations dizziness diaphoresis PND orthopnea or recent edema to lower extremities.  There was no nausea vomiting diarrhea constipation melena hematochezia dysuria frequency or hematuria.  Patient did have a fall on the Monday prior to admission.  In the emergency department patient's white count was 9.8 with 62 segs all of the labs being normal; chest x-ray showed a mild patchy opacity in the right midlung.  Patient was admitted to [facility] from 5/1-6 where she was treated for community-acquired pneumonia with a COPD exacerbation treated with Levaquin and nebs, and SVT, new onset, which subsequently resolved.  Is admitted to skilled nursing facility for OT/PT.  While at skilled nursing facility patient will be followed for hypertension treated with Cardizem and lisinopril, dementia treated with Namenda and diabetes mellitus type 2 treated with Amaryl.  Past Medical History:  Diagnosis Date  . Anemia   . Breast cancer ([place]) 1994  . COPD (chronic obstructive pulmonary disease) ([place])   .  Diabetes mellitus without complication ([place])    diet controlled vs. on glimepride  . Hypertension   . NPH (normal pressure hydrocephalus)   . Osteoporosis     Past Surgical History:  Procedure Laterality Date  . CHOLECYSTECTOMY    . COLONOSCOPY WITH PROPOFOL N/A 01/01/2017   Procedure: COLONOSCOPY WITH PROPOFOL;  Surgeon: Laurence Spates, MD;  Location: [facility];  Service: Endoscopy;  Laterality: N/A;  . MASTECTOMY  1994  . TUBAL LIGATION      Allergies as of 08/18/2017      Reactions   Keflex [cephalexin]    Nitrofurantoin    Other    Pt reports "microdentin"   Sulfa Antibiotics       Medication List        Accurate as of 08/18/17 11:59 PM. Always use your most recent med list.          acetaminophen 650 MG CR tablet Commonly known as:  TYLENOL 8 HOUR Take 1 tablet (650 mg total) by mouth every 8 (eight) hours as needed.   albuterol 108 (90 Base) MCG/ACT inhaler Commonly known as:  PROVENTIL HFA;VENTOLIN HFA Inhale 2 puffs into the lungs every 6 (six) hours as needed for wheezing.   atorvastatin 10 MG tablet Commonly known as:  LIPITOR Take 10 mg by mouth daily.   budesonide-formoterol 80-4.5 MCG/ACT inhaler Commonly known as:  SYMBICORT Inhale 2 puffs into the lungs 2 (two) times daily.   calcium gluconate 500 MG tablet Take 1 tablet by mouth daily.   cholecalciferol 1000 units tablet Commonly known as:  VITAMIN D Take 1,000 Units by mouth daily.   diltiazem 180 MG 24 hr capsule Commonly known as:  CARDIZEM  CD Take 180 mg by mouth daily.   fexofenadine 180 MG tablet Commonly known as:  ALLEGRA Take 180 mg by mouth daily.   Fish Oil 1000 MG Caps Take 1,000 mg by mouth daily.   fluticasone 50 MCG/ACT nasal spray Commonly known as:  FLONASE Place 2 sprays into both nostrils daily.   gabapentin 300 MG capsule Commonly known as:  NEURONTIN Take 300 mg by mouth at bedtime.   glimepiride 1 MG tablet Commonly known as:  AMARYL Take 1 mg by mouth.  Only takes this medication if blood sugars>150 per provider   guaiFENesin 600 MG 12 hr tablet Commonly known as:  MUCINEX Take 1 tablet (600 mg total) by mouth 2 (two) times daily.   ipratropium-albuterol 0.5-2.5 (3) MG/3ML Soln Commonly known as:  DUONEB Take 3 mLs by nebulization 4 (four) times daily.   iron polysaccharides 150 MG capsule Commonly known as:  NIFEREX Take 150 mg by mouth daily.   l-methylfolate-B6-B12 3-35-2 MG Tabs tablet Commonly known as:  METANX Take 1 tablet by mouth 2 (two) times daily.   levofloxacin 750 MG tablet Commonly known as:  LEVAQUIN Take 1 tablet (750 mg total) by mouth once for 1 dose.   lidocaine 5 % Commonly known as:  LIDODERM Place 1 patch onto the skin daily. Remove & Discard patch within 12 hours or as directed by MD   lisinopril 10 MG tablet Commonly known as:  PRINIVIL,ZESTRIL Take 10 mg by mouth daily.   Magnesium 250 MG Tabs Take 250 mg by mouth at bedtime.   memantine 10 MG tablet Commonly known as:  NAMENDA Take 10 mg by mouth 2 (two) times daily.   nitroGLYCERIN 0.4 MG SL tablet Commonly known as:  NITROSTAT Place 0.4 mg under the tongue every 5 (five) minutes as needed for chest pain.   ondansetron 4 MG tablet Commonly known as:  ZOFRAN Take 1 tablet (4 mg total) by mouth every 6 (six) hours as needed for nausea.   polyethylene glycol packet Commonly known as:  MIRALAX / GLYCOLAX Take 17 g by mouth daily.   ranitidine 150 MG tablet Commonly known as:  ZANTAC Take 150 mg by mouth 2 (two) times daily.   sodium chloride 0.65 % Soln nasal spray Commonly known as:  OCEAN Place 1 spray into both nostrils as needed for congestion.   sorbitol-magnesium hydroxide-mineral oil-glycerin Place 960 mLs rectally once as needed (give today if Dulcolax does no result in a good BM).   sucralfate 1 g tablet Commonly known as:  CARAFATE Take 1 g by mouth 2 (two) times daily.       No orders of the defined types were  placed in this encounter.   Immunization History  Administered Date(s) Administered  . Influenza Split 02/10/2005, 02/12/2007  . Influenza, High Dose Seasonal PF 12/31/2016  . Influenza, Seasonal, Injecte, Preservative Fre 02/11/2011  . Influenza-Unspecified 02/03/2012  . Pneumococcal Polysaccharide-23 02/12/2007, 12/23/2010  . Td 12/23/2010  . Tdap 03/27/2017  . Zoster Recombinat (Shingrix) 03/27/2017    Social History   Tobacco Use  . Smoking status: Never Smoker  . Smokeless tobacco: Former Systems developer    Types: Snuff, Chew  . Tobacco comment: lived with smokers most of her life; oral tobacco for 10 years?  Substance Use Topics  . Alcohol use: No    Family history is   Family History  Problem Relation Age of Onset  . Hypertension Mother   . Diabetes Mother   . Heart failure  Mother   . Stroke Mother   . Hypertension Father   . Diabetes Father   . Heart failure Father   . Stroke Father       Review of Systems  DATA OBTAINED: from patient, nurse GENERAL:  no fevers, fatigue, appetite changes SKIN: No itching, or rash EYES: No eye pain, redness, discharge EARS: No earache, tinnitus, change in hearing NOSE: No congestion, drainage or bleeding  MOUTH/THROAT: No mouth or tooth pain, No sore throat RESPIRATORY: No cough, wheezing, SOB CARDIAC: No chest pain, palpitations, lower extremity edema  GI: No abdominal pain, No N/V/D or constipation, No heartburn or reflux  GU: No dysuria, frequency or urgency, or incontinence  MUSCULOSKELETAL: No unrelieved bone/joint pain NEUROLOGIC: No headache, dizziness or focal weakness PSYCHIATRIC: No c/o anxiety or sadness   Vitals:   08/18/17 0837  BP: 115/60  Pulse: 74  Resp: 18  Temp: 98.3 F (36.8 C)  SpO2: 97%    SpO2 Readings from Last 1 Encounters:  08/18/17 97%   Body mass index is 36.25 kg/m.     Physical Exam  GENERAL APPEARANCE: Alert, conversant,  No acute distress.  SKIN: No diaphoresis rash HEAD:  Normocephalic, atraumatic  EYES: Conjunctiva/lids clear. Pupils round, reactive. EOMs intact.  EARS: External exam WNL, canals clear. Hearing grossly normal.  NOSE: No deformity or discharge.  MOUTH/THROAT: Lips w/o lesions  RESPIRATORY: Breathing is even, unlabored. Lung sounds are clear   CARDIOVASCULAR: Heart RRR no murmurs, rubs or gallops. No peripheral edema.   GASTROINTESTINAL: Abdomen is soft, non-tender, not distended w/ normal bowel sounds. GENITOURINARY: Bladder non tender, not distended  MUSCULOSKELETAL: No abnormal joints or musculature NEUROLOGIC:  Cranial nerves 2-12 grossly intact. Moves all extremities  PSYCHIATRIC: Mood and affect appropriate to situation with dementia, no behavioral issues  Patient Active Problem List   Diagnosis Date Noted  . Paroxysmal SVT (supraventricular tachycardia) ([place]) 08/17/2017  . Chronic back pain 08/17/2017  . Encounter for palliative care   . Goals of care, counseling/discussion   . Acute urinary retention 08/13/2017  . Frequent falls 08/13/2017  . DM2 (diabetes mellitus, type 2) ([place]) 08/13/2017  . Pressure injury of skin 08/13/2017  . CAP (community acquired pneumonia) 08/12/2017  . Acute respiratory failure with hypoxia ([place]) 06/20/2017  . Accident due to mechanical fall without injury 06/20/2017  . Dementia without behavioral disturbance 06/20/2017  . Confusion 06/20/2017  . Hyperlipidemia associated with type 2 diabetes mellitus ([place]) 06/20/2017  . GERD (gastroesophageal reflux disease) 06/20/2017  . Nodule of middle lobe of right lung 06/20/2017  . COPD with acute exacerbation ([place]) 06/15/2017  . Fall at home, initial encounter 06/15/2017  . COPD (chronic obstructive pulmonary disease) ([place]) 12/31/2016  . History of breast cancer 12/31/2016  . HTN (hypertension) 12/31/2016  . Hemorrhoids 12/31/2016  . Nodule of left lung 12/31/2016  . Lower GI bleed 12/30/2016  . Memory loss 10/22/2012      Labs reviewed: Basic  Metabolic Panel:    Component Value Date/Time   NA 136 08/17/2017 0849   K 4.5 08/17/2017 0849   CL 97 (L) 08/17/2017 0849   CO2 29 08/17/2017 0849   GLUCOSE 135 (H) 08/17/2017 0849   BUN 30 (H) 08/17/2017 0849   CREATININE 0.95 08/17/2017 0849   CALCIUM 9.0 08/17/2017 0849   PROT 7.9 08/12/2017 1851   ALBUMIN 3.5 08/12/2017 1851   AST 24 08/12/2017 1851   ALT 13 (L) 08/12/2017 1851   ALKPHOS 101 08/12/2017 1851   BILITOT 0.4  08/12/2017 1851   GFRNONAA 51 (L) 08/17/2017 0849   GFRAA 59 (L) 08/17/2017 0849    Recent Labs    01/01/17 0625  08/10/17 1105 08/12/17 1851 08/12/17 1902 08/12/17 1954 08/17/17 0849  NA 140   < > 141 140 139  --  136  K 4.0   < > 4.1 4.1 4.0  --  4.5  CL 103   < > 103 100* 98*  --  97*  CO2 26   < > 29 30  --   --  29  GLUCOSE 91   < > 158* 108* 102*  --  135*  BUN 13   < > 19 21* 20  --  30*  CREATININE 0.95   < > 1.04* 0.79 0.80  --  0.95  CALCIUM 9.1   < > 8.9 9.3  --   --  9.0  MG 1.7  --   --   --   --  2.0  --   PHOS  --   --   --   --   --  3.3  --    < > = values in this interval not displayed.   Liver Function Tests: Recent Labs    12/30/16 1715 08/12/17 1851  AST 30 24  ALT 14 13*  ALKPHOS 110 101  BILITOT 0.6 0.4  PROT 7.7 7.9  ALBUMIN 3.7 3.5   No results for input(s): LIPASE, AMYLASE in the last 8760 hours. No results for input(s): AMMONIA in the last 8760 hours. CBC: Recent Labs    01/01/17 0625  06/18/17 0421 08/10/17 1105 08/12/17 1851 08/12/17 1902  WBC 5.6   < > 8.3 11.8* 9.8  --   NEUTROABS 4.6  --   --  8.8* 6.1  --   HGB 11.0*   < > 8.4* 9.0* 9.9* 11.2*  HCT 33.2*   < > 27.7* 30.1* 32.8* 33.0*  MCV 90.5   < > 75.3* 78.0 78.8  --   PLT 259   < > 339 237 239  --    < > = values in this interval not displayed.   Lipid No results for input(s): CHOL, HDL, LDLCALC, TRIG in the last 8760 hours.  Cardiac Enzymes: Recent Labs    08/10/17 1105  TROPONINI 0.03*   BNP: Recent Labs    08/12/17 1851    BNP 43.4   No results found for: [facility] Lab Results  Component Value Date   HGBA1C 6.3 (H) 01/01/2017   Lab Results  Component Value Date   TSH 2.458 01/01/2017   Lab Results  Component Value Date   VITAMINB12 2,773 (H) 01/01/2017   Lab Results  Component Value Date   FOLATE 72.0 01/01/2017   Lab Results  Component Value Date   IRON 69 01/01/2017   TIBC 451 (H) 01/01/2017    Imaging and Procedures obtained prior to SNF admission: Dg Chest 2 View  Result Date: 08/12/2017 CLINICAL DATA:  Loss of appetite, fever EXAM: CHEST - 2 VIEW COMPARISON:  06/17/2017 FINDINGS: Increased interstitial markings. Mild patchy opacity in the right mid lung, raising the possibility of pneumonia. No pleural effusion or pneumothorax. The heart is normal in size. Degenerative changes of the visualized thoracolumbar spine. Moderate compression fracture of a midthoracic vertebral body. Mild superior endplate compression fracture of a upper lumbar vertebral body. These findings are unchanged. IMPRESSION: Mild patchy opacity in the right mid lung, raising the possibility of pneumonia. Electronically Signed  By: Julian Hy M.D.   On: 08/12/2017 18:43     Not all labs, radiology exams or other studies done during hospitalization come through on my EPIC note; however they are reviewed by me.    Assessment and Plan  Community-acquired pneumonia/COPD exacerbation- right middle lobe pneumonia; incidental right middle lobe no nodule seen on CT with a recommended six-month follow-up which may not be necessary secondary to patient's age; patient was dosed renally with Levaquin and treated with nebs and prednisone for her COPD exacerbation SNF -admitted for OT/PT; patient on 11 04/20/1946 every 48 hours and is to receive 1 more dose at skilled nursing facility; those have been completed; we will continue Symbicort 2 puffs twice daily, guaifenesin 600 mg twice daily, DuoNeb 4 times daily  SVT, new  onset- patient had a couple of runs of SVT on 5/2 and Cardizem was resumed; patient had one run on 5/5 and patient's nebs were changed to as needed secondary to improved respiratory status and SVT resolved SNF -continue diltiazem 180 mg 24-hour capsule 1 p.o. Daily  Urinary retention- occurred on 5/2; voiding trial on 5/3 with success  Low back pain/frequent falls- acute secondary to fall on 4/29; imaging in ER with lumbar x-ray showed old compression fractures of T12 and L1 with no new issues; patient is on lidocaine patch SNF -OT/PT; continue lidocaine patch daily  Hypertension SNF -control; continue diltiazem 180 mg daily and lisinopril 10 mg daily  Dementia SNF -continue Namenda 10 mg p.o. twice daily  DM2 SNF - stable;cont glimepiride 1 mg daily  Hyperlipidemia associated with DM2 SNF- stable;cont lipitor 10 mg daily    Time spent > 45 min;> 50% of time with patient was spent reviewing records, labs, tests and studies, counseling and developing plan of care  Webb Silversmith D. Sheppard Coil, MD

## 2017-08-22 ENCOUNTER — Encounter: Payer: Self-pay | Admitting: Internal Medicine

## 2017-08-25 LAB — BASIC METABOLIC PANEL
BUN: 18 (ref 4–21)
Creatinine: 0.8 (ref 0.5–1.1)
GLUCOSE: 130
Potassium: 4.8 (ref 3.4–5.3)
SODIUM: 144 (ref 137–147)

## 2017-09-01 ENCOUNTER — Non-Acute Institutional Stay (SKILLED_NURSING_FACILITY): Payer: Medicare Other | Admitting: Internal Medicine

## 2017-09-01 ENCOUNTER — Encounter: Payer: Self-pay | Admitting: Internal Medicine

## 2017-09-01 DIAGNOSIS — R600 Localized edema: Secondary | ICD-10-CM | POA: Diagnosis not present

## 2017-09-01 NOTE — Progress Notes (Signed)
Location:  [place] Room Number: [place]:  SNF ([id number])  Noah Delaine. Sheppard Coil, MD  Patient Care Team: Lawerance Cruel, MD as PCP - General (Family Medicine)  Extended Emergency Contact Information Primary Emergency Contact: Hinda Glatter of [place] Phone: [phone] Relation: Daughter    Allergies: Keflex [cephalexin]; Nitrofurantoin; Other; and Sulfa antibiotics  Chief Complaint  Patient presents with  . Acute Visit    Edema    HPI: Patient is 84 y.o. female who nursing asked me to see because have noted lower extremity edema patient denies shortness of breath or chest pain.  Patient is currently not on any Lasix.  Past Medical History:  Diagnosis Date  . Anemia   . Breast cancer ([place]) 1994  . COPD (chronic obstructive pulmonary disease) ([place])   . Diabetes mellitus without complication ([place])    diet controlled vs. on glimepride  . Hypertension   . NPH (normal pressure hydrocephalus)   . Osteoporosis     Past Surgical History:  Procedure Laterality Date  . CHOLECYSTECTOMY    . COLONOSCOPY WITH PROPOFOL N/A 01/01/2017   Procedure: COLONOSCOPY WITH PROPOFOL;  Surgeon: Laurence Spates, MD;  Location: [facility];  Service: Endoscopy;  Laterality: N/A;  . MASTECTOMY  1994  . TUBAL LIGATION      Allergies as of 09/01/2017      Reactions   Keflex [cephalexin]    Nitrofurantoin    Other    Pt reports "microdentin"   Sulfa Antibiotics       Medication List        Accurate as of 09/01/17  3:02 PM. Always use your most recent med list.          acetaminophen 650 MG CR tablet Commonly known as:  TYLENOL 8 HOUR Take 1 tablet (650 mg total) by mouth every 8 (eight) hours as needed.   albuterol 108 (90 Base) MCG/ACT inhaler Commonly known as:  PROVENTIL HFA;VENTOLIN HFA Inhale 2 puffs into the lungs every 6 (six) hours as needed for wheezing.   atorvastatin 10 MG tablet Commonly known as:   LIPITOR Take 10 mg by mouth daily.   budesonide-formoterol 80-4.5 MCG/ACT inhaler Commonly known as:  SYMBICORT Inhale 2 puffs into the lungs 2 (two) times daily.   calcium gluconate 500 MG tablet Take 1 tablet by mouth daily.   cholecalciferol 1000 units tablet Commonly known as:  VITAMIN D Take 1,000 Units by mouth daily.   diltiazem 180 MG 24 hr capsule Commonly known as:  CARDIZEM CD Take 180 mg by mouth daily.   fexofenadine 180 MG tablet Commonly known as:  ALLEGRA Take 180 mg by mouth daily.   Fish Oil 1000 MG Caps Take 1,000 mg by mouth daily.   fluticasone 50 MCG/ACT nasal spray Commonly known as:  FLONASE Place 2 sprays into both nostrils daily.   gabapentin 300 MG capsule Commonly known as:  NEURONTIN Take 300 mg by mouth at bedtime.   glimepiride 1 MG tablet Commonly known as:  AMARYL Take 1 mg by mouth. Only takes this medication if blood sugars>150 per provider   guaiFENesin 600 MG 12 hr tablet Commonly known as:  MUCINEX Take 1 tablet (600 mg total) by mouth 2 (two) times daily.   ipratropium-albuterol 0.5-2.5 (3) MG/3ML Soln Commonly known as:  DUONEB Take 3 mLs by nebulization 4 (four) times daily.   iron polysaccharides 150 MG capsule Commonly known as:  NIFEREX Take 150  mg by mouth daily.   l-methylfolate-B6-B12 3-35-2 MG Tabs tablet Commonly known as:  METANX Take 1 tablet by mouth 2 (two) times daily.   LASIX 40 MG tablet Generic drug:  furosemide Take 40 mg by mouth daily.   lidocaine 5 % Commonly known as:  LIDODERM Place 1 patch onto the skin daily. Remove & Discard patch within 12 hours or as directed by MD   lisinopril 10 MG tablet Commonly known as:  PRINIVIL,ZESTRIL Take 10 mg by mouth daily.   Magnesium 250 MG Tabs Take 250 mg by mouth at bedtime.   memantine 10 MG tablet Commonly known as:  NAMENDA Take 10 mg by mouth 2 (two) times daily.   nitroGLYCERIN 0.4 MG SL tablet Commonly known as:  NITROSTAT Place 0.4 mg  under the tongue every 5 (five) minutes as needed for chest pain.   ondansetron 4 MG tablet Commonly known as:  ZOFRAN Take 1 tablet (4 mg total) by mouth every 6 (six) hours as needed for nausea.   polyethylene glycol packet Commonly known as:  MIRALAX / GLYCOLAX Take 17 g by mouth daily.   ranitidine 150 MG tablet Commonly known as:  ZANTAC Take 150 mg by mouth 2 (two) times daily.   sodium chloride 0.65 % Soln nasal spray Commonly known as:  OCEAN Place 1 spray into both nostrils as needed for congestion.   sucralfate 1 g tablet Commonly known as:  CARAFATE Take 1 g by mouth 2 (two) times daily.       No orders of the defined types were placed in this encounter.   Immunization History  Administered Date(s) Administered  . Influenza Split 02/10/2005, 02/12/2007  . Influenza, High Dose Seasonal PF 12/31/2016  . Influenza, Seasonal, Injecte, Preservative Fre 02/11/2011  . Influenza-Unspecified 02/03/2012  . Pneumococcal Polysaccharide-23 02/12/2007, 12/23/2010  . Td 12/23/2010  . Tdap 03/27/2017  . Zoster Recombinat (Shingrix) 03/27/2017    Social History   Tobacco Use  . Smoking status: Never Smoker  . Smokeless tobacco: Former Systems developer    Types: Snuff, Chew  . Tobacco comment: lived with smokers most of her life; oral tobacco for 10 years?  Substance Use Topics  . Alcohol use: No    Review of Systems  DATA OBTAINED: from patient, nurse as per history of present illness GENERAL:  no fevers, fatigue, appetite changes SKIN: No itching, rash HEENT: No complaint RESPIRATORY: No cough, wheezing, SOB CARDIAC: No chest pain, palpitations, +lower extremity edema  GI: No abdominal pain, No N/V/D or constipation, No heartburn or reflux  GU: No dysuria, frequency or urgency, or incontinence  MUSCULOSKELETAL: No unrelieved bone/joint pain NEUROLOGIC: No headache, dizziness  PSYCHIATRIC: No overt anxiety or sadness  Vitals:   09/01/17 1438  BP: 133/68  Pulse: 75    Temp: (!) 97.1 F (36.2 C)  SpO2: 97%   Body mass index is 36.91 kg/m. Physical Exam  GENERAL APPEARANCE: Alert, conversant, No acute distress  SKIN: No diaphoresis rash HEENT: Unremarkable RESPIRATORY: Breathing is even, unlabored. Lung sounds are clear   CARDIOVASCULAR: Heart RRR no murmurs, rubs or gallops. 1-2+ peripheral edema  GASTROINTESTINAL: Abdomen is soft, non-tender, not distended w/ normal bowel sounds.  GENITOURINARY: Bladder non tender, not distended  MUSCULOSKELETAL: No abnormal joints or musculature NEUROLOGIC: Cranial nerves 2-12 grossly intact. Moves all extremities PSYCHIATRIC: Mood and affect appropriate to situation, no behavioral issues  Patient Active Problem List   Diagnosis Date Noted  . Paroxysmal SVT (supraventricular tachycardia) ([place]) 08/17/2017  .  Chronic back pain 08/17/2017  . Encounter for palliative care   . Goals of care, counseling/discussion   . Acute urinary retention 08/13/2017  . Frequent falls 08/13/2017  . DM2 (diabetes mellitus, type 2) ([place]) 08/13/2017  . Pressure injury of skin 08/13/2017  . CAP (community acquired pneumonia) 08/12/2017  . Acute respiratory failure with hypoxia ([place]) 06/20/2017  . Accident due to mechanical fall without injury 06/20/2017  . Dementia without behavioral disturbance 06/20/2017  . Confusion 06/20/2017  . Hyperlipidemia associated with type 2 diabetes mellitus ([place]) 06/20/2017  . GERD (gastroesophageal reflux disease) 06/20/2017  . Nodule of middle lobe of right lung 06/20/2017  . COPD with acute exacerbation ([place]) 06/15/2017  . Fall at home, initial encounter 06/15/2017  . COPD (chronic obstructive pulmonary disease) ([place]) 12/31/2016  . History of breast cancer 12/31/2016  . HTN (hypertension) 12/31/2016  . Hemorrhoids 12/31/2016  . Nodule of left lung 12/31/2016  . Lower GI bleed 12/30/2016  . Memory loss 10/22/2012    CMP     Component Value Date/Time   NA 136 08/17/2017 0849   K 4.5  08/17/2017 0849   CL 97 (L) 08/17/2017 0849   CO2 29 08/17/2017 0849   GLUCOSE 135 (H) 08/17/2017 0849   BUN 30 (H) 08/17/2017 0849   CREATININE 0.95 08/17/2017 0849   CALCIUM 9.0 08/17/2017 0849   PROT 7.9 08/12/2017 1851   ALBUMIN 3.5 08/12/2017 1851   AST 24 08/12/2017 1851   ALT 13 (L) 08/12/2017 1851   ALKPHOS 101 08/12/2017 1851   BILITOT 0.4 08/12/2017 1851   GFRNONAA 51 (L) 08/17/2017 0849   GFRAA 59 (L) 08/17/2017 0849   Recent Labs    01/01/17 0625  08/10/17 1105 08/12/17 1851 08/12/17 1902 08/12/17 1954 08/17/17 0849  NA 140   < > 141 140 139  --  136  K 4.0   < > 4.1 4.1 4.0  --  4.5  CL 103   < > 103 100* 98*  --  97*  CO2 26   < > 29 30  --   --  29  GLUCOSE 91   < > 158* 108* 102*  --  135*  BUN 13   < > 19 21* 20  --  30*  CREATININE 0.95   < > 1.04* 0.79 0.80  --  0.95  CALCIUM 9.1   < > 8.9 9.3  --   --  9.0  MG 1.7  --   --   --   --  2.0  --   PHOS  --   --   --   --   --  3.3  --    < > = values in this interval not displayed.   Recent Labs    12/30/16 1715 08/12/17 1851  AST 30 24  ALT 14 13*  ALKPHOS 110 101  BILITOT 0.6 0.4  PROT 7.7 7.9  ALBUMIN 3.7 3.5   Recent Labs    01/01/17 0625  06/18/17 0421 08/10/17 1105 08/12/17 1851 08/12/17 1902  WBC 5.6   < > 8.3 11.8* 9.8  --   NEUTROABS 4.6  --   --  8.8* 6.1  --   HGB 11.0*   < > 8.4* 9.0* 9.9* 11.2*  HCT 33.2*   < > 27.7* 30.1* 32.8* 33.0*  MCV 90.5   < > 75.3* 78.0 78.8  --   PLT 259   < > 339 237 239  --    < > =  values in this interval not displayed.   No results for input(s): CHOL, LDLCALC, TRIG in the last 8760 hours.  Invalid input(s): HCL No results found for: MICROALBUR Lab Results  Component Value Date   TSH 2.458 01/01/2017   Lab Results  Component Value Date   HGBA1C 6.3 (H) 01/01/2017   No results found for: CHOL, HDL, LDLCALC, LDLDIRECT, TRIG, CHOLHDL  Significant Diagnostic Results in last 30 days:  Dg Chest 2 View  Result Date: 08/12/2017 CLINICAL  DATA:  Loss of appetite, fever EXAM: CHEST - 2 VIEW COMPARISON:  06/17/2017 FINDINGS: Increased interstitial markings. Mild patchy opacity in the right mid lung, raising the possibility of pneumonia. No pleural effusion or pneumothorax. The heart is normal in size. Degenerative changes of the visualized thoracolumbar spine. Moderate compression fracture of a midthoracic vertebral body. Mild superior endplate compression fracture of a upper lumbar vertebral body. These findings are unchanged. IMPRESSION: Mild patchy opacity in the right mid lung, raising the possibility of pneumonia. Electronically Signed   By: Julian Hy M.D.   On: 08/12/2017 18:43   Dg Lumbar Spine Complete  Result Date: 08/10/2017 CLINICAL DATA:  Acute low back pain fall at home. EXAM: LUMBAR SPINE - COMPLETE 4+ VIEW COMPARISON:  CT scan of June 15, 2017. FINDINGS: Stable old compression deformities of T12 and L1 vertebral bodies. No acute fracture or spondylolisthesis is noted. Atherosclerosis of abdominal aorta is noted. Mild degenerative disc disease is noted at T12-L1 and L1-2 with anterior osteophyte formation. Diffuse osteopenia is noted. IMPRESSION: No acute abnormality seen in the lumbar spine. Electronically Signed   By: Marijo Conception, M.D.   On: 08/10/2017 09:19   Ct Head Wo Contrast  Result Date: 08/10/2017 CLINICAL DATA:  Fall.  Patient denies hitting her head. EXAM: CT HEAD WITHOUT CONTRAST TECHNIQUE: Contiguous axial images were obtained from the base of the skull through the vertex without intravenous contrast. COMPARISON:  None. FINDINGS: Brain: No evidence of acute infarction, hemorrhage, hydrocephalus, extra-axial collection or mass lesion/mass effect. Mild atrophy. 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 Lacunar infarcts in the bilateral basal ganglia. Moderate periventricular and subcortical white matter hypodensities are nonspecific, but favored to reflect chronic microvascular ischemic changes. Vascular:  Calcified atherosclerosis at the skullbase. No hyperdense vessel. Skull: Negative for fracture or focal lesion. Sinuses/Orbits: Opacification of the underdeveloped right mastoid air cells. Left mastoid air cells are clear. Frothy secretions within the left sphenoid sinus. No acute orbital abnormality. Other: None. IMPRESSION: 1. No acute intracranial abnormality. Chronic changes as described above. 2. Right mastoid effusion. Electronically Signed   By: Titus Dubin M.D.   On: 08/10/2017 09:35   Dg Hip Unilat W Or Wo Pelvis 2-3 Views Right  Result Date: 08/10/2017 CLINICAL DATA:  Right hip pain after unwitnessed fall at home today EXAM: DG HIP (WITH OR WITHOUT PELVIS) 2-3V RIGHT COMPARISON:  Radiographs of June 15, 2017. FINDINGS: There is no evidence of hip fracture or dislocation. There is no evidence of arthropathy or other focal bone abnormality. IMPRESSION: Normal right hip. Electronically Signed   By: Marijo Conception, M.D.   On: 08/10/2017 09:16    Assessment and Plan  Bilateral lower extremity edema- no signs of heart failure; will restart Lasix 40 mg daily; will monitor results    Webb Silversmith D. Sheppard Coil, MD

## 2017-09-07 LAB — BASIC METABOLIC PANEL
BUN: 19 (ref 4–21)
CREATININE: 0.8 (ref 0.5–1.1)
Glucose: 99
POTASSIUM: 3.4 (ref 3.4–5.3)
Sodium: 146 (ref 137–147)

## 2017-09-14 ENCOUNTER — Non-Acute Institutional Stay (SKILLED_NURSING_FACILITY): Payer: Medicare Other | Admitting: Internal Medicine

## 2017-09-14 ENCOUNTER — Encounter: Payer: Self-pay | Admitting: Internal Medicine

## 2017-09-14 DIAGNOSIS — G301 Alzheimer's disease with late onset: Secondary | ICD-10-CM | POA: Diagnosis not present

## 2017-09-14 DIAGNOSIS — F0280 Dementia in other diseases classified elsewhere without behavioral disturbance: Secondary | ICD-10-CM | POA: Diagnosis not present

## 2017-09-14 DIAGNOSIS — J181 Lobar pneumonia, unspecified organism: Secondary | ICD-10-CM | POA: Diagnosis not present

## 2017-09-14 DIAGNOSIS — I471 Supraventricular tachycardia: Secondary | ICD-10-CM

## 2017-09-14 DIAGNOSIS — J189 Pneumonia, unspecified organism: Secondary | ICD-10-CM

## 2017-09-14 DIAGNOSIS — E1169 Type 2 diabetes mellitus with other specified complication: Secondary | ICD-10-CM | POA: Diagnosis not present

## 2017-09-14 DIAGNOSIS — E785 Hyperlipidemia, unspecified: Secondary | ICD-10-CM | POA: Diagnosis not present

## 2017-09-14 DIAGNOSIS — R296 Repeated falls: Secondary | ICD-10-CM | POA: Diagnosis not present

## 2017-09-14 DIAGNOSIS — J441 Chronic obstructive pulmonary disease with (acute) exacerbation: Secondary | ICD-10-CM

## 2017-09-14 DIAGNOSIS — I10 Essential (primary) hypertension: Secondary | ICD-10-CM | POA: Diagnosis not present

## 2017-09-14 DIAGNOSIS — R338 Other retention of urine: Secondary | ICD-10-CM | POA: Diagnosis not present

## 2017-09-14 NOTE — Progress Notes (Signed)
Location:  [facility] Room Number: [id number] Place of Service:  SNF [phone]) Noah Delaine. Sheppard Coil, MD  PCP: Lawerance Cruel, MD Patient Care Team: Lawerance Cruel, MD as PCP - General (Family Medicine)  Extended Emergency Contact Information Primary Emergency Contact: Sharrie Rothman [place] of [place] Phone: [phone] Relation: Daughter  Allergies  Allergen Reactions  . Keflex [Cephalexin]   . Nitrofurantoin   . Other     Pt reports "microdentin"  . Sulfa Antibiotics     Chief Complaint  Patient presents with  . Discharge Note    Discharged from SNF    HPI:  84 y.o. female with anemia, history of breast cancer, COPD, type 2 diabetes, hypertension, normal pressure hydrocephalus, and osteoporosis, who was brought to the emergency department due to decreased appetite, fever, chills, fatigue, malaise, hypersomnolence, productive cough, and pleuritic chest pain.  Patient denied headache, rhinorrhea, sore throat, palpitations, dizziness, diaphoresis, PND, orthopnea, or recent edema to lower extremities.  There is no nausea, vomiting, diarrhea, constipation, melena, hematochezia, dysuria, frequency, or hematuria, patient did have a fall in the Monday prior to admission.  In the emergency department patient's white count was 9.8 with 62 segs and all labs being normal chest x-ray showed mild patchy opacity in the right midlung.  Patient was admitted to [facility] from [facility]6 where she was treated for community-acquired pneumonia with a COPD exacerbation treated with Levaquin and nebs and SVT new onset which subsequently resolved.  Patient was admitted to skilled nursing facility for OT/PT and is now ready to be discharged to home.    Past Medical History:  Diagnosis Date  . Anemia   . Breast cancer ([place]) 1994  . COPD (chronic obstructive pulmonary disease) ([place])   . Diabetes mellitus without complication ([place])    diet controlled vs. on  glimepride  . Hypertension   . NPH (normal pressure hydrocephalus)   . Osteoporosis     Past Surgical History:  Procedure Laterality Date  . CHOLECYSTECTOMY    . COLONOSCOPY WITH PROPOFOL N/A 01/01/2017   Procedure: COLONOSCOPY WITH PROPOFOL;  Surgeon: Laurence Spates, MD;  Location: [facility];  Service: Endoscopy;  Laterality: N/A;  . MASTECTOMY  1994  . TUBAL LIGATION       reports that she has never smoked. She has quit using smokeless tobacco. Her smokeless tobacco use included snuff and chew. She reports that she does not drink alcohol or use drugs. Social History   Socioeconomic History  . Marital status: Widowed    Spouse name: Not on file  . Number of children: Not on file  . Years of education: Not on file  . Highest education level: Not on file  Occupational History  . Occupation: retired  Scientific laboratory technician  . Financial resource strain: Not on file  . Food insecurity:    Worry: Not on file    Inability: Not on file  . Transportation needs:    Medical: Not on file    Non-medical: Not on file  Tobacco Use  . Smoking status: Never Smoker  . Smokeless tobacco: Former Systems developer    Types: Snuff, Chew  . Tobacco comment: lived with smokers most of her life; oral tobacco for 10 years?  Substance and Sexual Activity  . Alcohol use: No  . Drug use: No  . Sexual activity: Not on file  Lifestyle  . Physical activity:    Days per week: Not on file    Minutes  per session: Not on file  . Stress: Not on file  Relationships  . Social connections:    Talks on phone: Not on file    Gets together: Not on file    Attends religious service: Not on file    Active member of club or organization: Not on file    Attends meetings of clubs or organizations: Not on file    Relationship status: Not on file  . Intimate partner violence:    Fear of current or ex partner: Not on file    Emotionally abused: Not on file    Physically abused: Not on file    Forced sexual activity: Not on  file  Other Topics Concern  . Not on file  Social History Narrative   Lives with daughter and son-in-law.  Widowed.    Pertinent  Health Maintenance Due  Topic Date Due  . FOOT EXAM  07/16/2018 (Originally 12/17/1934)  . HEMOGLOBIN A1C  07/16/2018 (Originally 07/01/2017)  . OPHTHALMOLOGY EXAM  07/16/2018 (Originally 12/17/1934)  . PNA vac Low Risk Adult (2 of 2 - PCV13) 07/16/2018 (Originally 12/23/2011)  . INFLUENZA VACCINE  11/12/2017  . DEXA SCAN  Discontinued    Medications: Allergies as of 09/14/2017      Reactions   Keflex [cephalexin]    Nitrofurantoin    Other    Pt reports "microdentin"   Sulfa Antibiotics       Medication List        Accurate as of 09/14/17  9:00 AM. Always use your most recent med list.          acetaminophen 650 MG CR tablet Commonly known as:  TYLENOL 8 HOUR Take 1 tablet (650 mg total) by mouth every 8 (eight) hours as needed.   albuterol 108 (90 Base) MCG/ACT inhaler Commonly known as:  PROVENTIL HFA;VENTOLIN HFA Inhale 2 puffs into the lungs every 6 (six) hours as needed for wheezing.   atorvastatin 10 MG tablet Commonly known as:  LIPITOR Take 10 mg by mouth daily.   budesonide-formoterol 80-4.5 MCG/ACT inhaler Commonly known as:  SYMBICORT Inhale 2 puffs into the lungs 2 (two) times daily.   calcium gluconate 500 MG tablet Take 1 tablet by mouth daily.   cholecalciferol 1000 units tablet Commonly known as:  VITAMIN D Take 1,000 Units by mouth daily.   diltiazem 180 MG 24 hr capsule Commonly known as:  CARDIZEM CD Take 180 mg by mouth daily.   fexofenadine 180 MG tablet Commonly known as:  ALLEGRA Take 180 mg by mouth daily.   Fish Oil 1000 MG Caps Take 1,000 mg by mouth daily.   fluticasone 50 MCG/ACT nasal spray Commonly known as:  FLONASE Place 2 sprays into both nostrils daily.   gabapentin 300 MG capsule Commonly known as:  NEURONTIN Take 300 mg by mouth at bedtime.   glimepiride 1 MG tablet Commonly known as:   AMARYL Take 1 mg by mouth. Only takes this medication if blood sugars>150 per provider   guaiFENesin 600 MG 12 hr tablet Commonly known as:  MUCINEX Take 1 tablet (600 mg total) by mouth 2 (two) times daily.   ipratropium-albuterol 0.5-2.5 (3) MG/3ML Soln Commonly known as:  DUONEB Take 3 mLs by nebulization 4 (four) times daily.   iron polysaccharides 150 MG capsule Commonly known as:  NIFEREX Take 150 mg by mouth daily.   l-methylfolate-B6-B12 3-35-2 MG Tabs tablet Commonly known as:  METANX Take 1 tablet by mouth 2 (two) times daily.  LASIX 40 MG tablet Generic drug:  furosemide Take 40 mg by mouth daily.   lidocaine 5 % Commonly known as:  LIDODERM Place 1 patch onto the skin daily. Remove & Discard patch within 12 hours or as directed by MD   lisinopril 10 MG tablet Commonly known as:  PRINIVIL,ZESTRIL Take 10 mg by mouth daily.   Magnesium 250 MG Tabs Take 250 mg by mouth at bedtime.   memantine 10 MG tablet Commonly known as:  NAMENDA Take 10 mg by mouth 2 (two) times daily.   nitroGLYCERIN 0.4 MG SL tablet Commonly known as:  NITROSTAT Place 0.4 mg under the tongue every 5 (five) minutes as needed for chest pain.   ondansetron 4 MG tablet Commonly known as:  ZOFRAN Take 1 tablet (4 mg total) by mouth every 6 (six) hours as needed for nausea.   polyethylene glycol packet Commonly known as:  MIRALAX / GLYCOLAX Take 17 g by mouth daily.   ranitidine 150 MG tablet Commonly known as:  ZANTAC Take 150 mg by mouth 2 (two) times daily.   sodium chloride 0.65 % Soln nasal spray Commonly known as:  OCEAN Place 1 spray into both nostrils as needed for congestion.   sucralfate 1 g tablet Commonly known as:  CARAFATE Take 1 g by mouth 2 (two) times daily.        Vitals:   09/14/17 0849  BP: 112/63  Pulse: 87  Resp: 16  Temp: 98 F (36.7 C)  TempSrc: Oral  SpO2: 96%  Weight: 185 lb 3.2 oz (84 kg)  Height: 5' (1.524 m)   Body mass index is 36.17  kg/m.  Physical Exam  GENERAL APPEARANCE: Alert, conversant. No acute distress.  HEENT: Unremarkable. RESPIRATORY: Breathing is even, unlabored. Lung sounds are clear   CARDIOVASCULAR: Heart RRR no murmurs, rubs or gallops. No peripheral edema.  GASTROINTESTINAL: Abdomen is soft, non-tender, not distended w/ normal bowel sounds.  NEUROLOGIC: Cranial nerves 2-12 grossly intact. Moves all extremities   Labs reviewed: Basic Metabolic Panel: Recent Labs    01/01/17 0625  08/10/17 1105 08/12/17 1851 08/12/17 1902 08/12/17 1954 08/17/17 0849 08/25/17 09/07/17  NA 140   < > 141 140 139  --  136 144 146  K 4.0   < > 4.1 4.1 4.0  --  4.5 4.8 3.4  CL 103   < > 103 100* 98*  --  97*  --   --   CO2 26   < > 29 30  --   --  29  --   --   GLUCOSE 91   < > 158* 108* 102*  --  135*  --   --   BUN 13   < > 19 21* 20  --  30* 18 19  CREATININE 0.95   < > 1.04* 0.79 0.80  --  0.95 0.8 0.8  CALCIUM 9.1   < > 8.9 9.3  --   --  9.0  --   --   MG 1.7  --   --   --   --  2.0  --   --   --   PHOS  --   --   --   --   --  3.3  --   --   --    < > = values in this interval not displayed.   No results found for: [facility] Liver Function Tests: Recent Labs    12/30/16 1715 08/12/17 1851  AST 30 24  ALT 14 13*  ALKPHOS 110 101  BILITOT 0.6 0.4  PROT 7.7 7.9  ALBUMIN 3.7 3.5   No results for input(s): LIPASE, AMYLASE in the last 8760 hours. No results for input(s): AMMONIA in the last 8760 hours. CBC: Recent Labs    01/01/17 0625  06/18/17 0421 08/10/17 1105 08/12/17 1851 08/12/17 1902  WBC 5.6   < > 8.3 11.8* 9.8  --   NEUTROABS 4.6  --   --  8.8* 6.1  --   HGB 11.0*   < > 8.4* 9.0* 9.9* 11.2*  HCT 33.2*   < > 27.7* 30.1* 32.8* 33.0*  MCV 90.5   < > 75.3* 78.0 78.8  --   PLT 259   < > 339 237 239  --    < > = values in this interval not displayed.   Lipid No results for input(s): CHOL, HDL, LDLCALC, TRIG in the last 8760 hours. Cardiac Enzymes: Recent Labs    08/10/17 1105    TROPONINI 0.03*   BNP: Recent Labs    08/12/17 1851  BNP 43.4   CBG: Recent Labs    08/16/17 2125 08/17/17 0751 08/17/17 1309  GLUCAP 145* 108* 182*    Procedures and Imaging Studies During Stay: No results found.  Assessment/Plan:   No diagnosis found.   Patient is being discharged with the following home health services: OT/PT/nursing  Patient is being discharged with the following durable medical equipment: None  Patient has been advised to f/u with their PCP in 1-2 weeks to bring them up to date on their rehab stay.  Social services at facility was responsible for arranging this appointment.  Pt was provided with a 30 day supply of prescriptions for medications and refills must be obtained from their PCP.  For controlled substances, a more limited supply may be provided adequate until PCP appointment only.  Medications have been reconciled.  Time spent greater than 30 minutes;> 50% of time with patient was spent reviewing records, labs, tests and studies, counseling and developing plan of care  Noah Delaine. Sheppard Coil, MD

## 2017-09-16 DIAGNOSIS — N183 Chronic kidney disease, stage 3 (moderate): Secondary | ICD-10-CM | POA: Diagnosis not present

## 2017-09-16 DIAGNOSIS — I872 Venous insufficiency (chronic) (peripheral): Secondary | ICD-10-CM | POA: Diagnosis not present

## 2017-09-16 DIAGNOSIS — I129 Hypertensive chronic kidney disease with stage 1 through stage 4 chronic kidney disease, or unspecified chronic kidney disease: Secondary | ICD-10-CM | POA: Diagnosis not present

## 2017-09-16 DIAGNOSIS — E11628 Type 2 diabetes mellitus with other skin complications: Secondary | ICD-10-CM | POA: Diagnosis not present

## 2017-09-16 DIAGNOSIS — M810 Age-related osteoporosis without current pathological fracture: Secondary | ICD-10-CM | POA: Diagnosis not present

## 2017-09-16 DIAGNOSIS — J441 Chronic obstructive pulmonary disease with (acute) exacerbation: Secondary | ICD-10-CM | POA: Diagnosis not present

## 2017-09-18 DIAGNOSIS — N183 Chronic kidney disease, stage 3 (moderate): Secondary | ICD-10-CM | POA: Diagnosis not present

## 2017-09-18 DIAGNOSIS — I129 Hypertensive chronic kidney disease with stage 1 through stage 4 chronic kidney disease, or unspecified chronic kidney disease: Secondary | ICD-10-CM | POA: Diagnosis not present

## 2017-09-18 DIAGNOSIS — E11628 Type 2 diabetes mellitus with other skin complications: Secondary | ICD-10-CM | POA: Diagnosis not present

## 2017-09-18 DIAGNOSIS — M810 Age-related osteoporosis without current pathological fracture: Secondary | ICD-10-CM | POA: Diagnosis not present

## 2017-09-18 DIAGNOSIS — J441 Chronic obstructive pulmonary disease with (acute) exacerbation: Secondary | ICD-10-CM | POA: Diagnosis not present

## 2017-09-18 DIAGNOSIS — I872 Venous insufficiency (chronic) (peripheral): Secondary | ICD-10-CM | POA: Diagnosis not present

## 2017-09-20 ENCOUNTER — Encounter: Payer: Self-pay | Admitting: Internal Medicine

## 2017-09-20 DIAGNOSIS — E785 Hyperlipidemia, unspecified: Secondary | ICD-10-CM | POA: Insufficient documentation

## 2017-09-21 DIAGNOSIS — I129 Hypertensive chronic kidney disease with stage 1 through stage 4 chronic kidney disease, or unspecified chronic kidney disease: Secondary | ICD-10-CM | POA: Diagnosis not present

## 2017-09-21 DIAGNOSIS — J441 Chronic obstructive pulmonary disease with (acute) exacerbation: Secondary | ICD-10-CM | POA: Diagnosis not present

## 2017-09-21 DIAGNOSIS — E11628 Type 2 diabetes mellitus with other skin complications: Secondary | ICD-10-CM | POA: Diagnosis not present

## 2017-09-21 DIAGNOSIS — N183 Chronic kidney disease, stage 3 (moderate): Secondary | ICD-10-CM | POA: Diagnosis not present

## 2017-09-21 DIAGNOSIS — I872 Venous insufficiency (chronic) (peripheral): Secondary | ICD-10-CM | POA: Diagnosis not present

## 2017-09-21 DIAGNOSIS — M810 Age-related osteoporosis without current pathological fracture: Secondary | ICD-10-CM | POA: Diagnosis not present

## 2017-09-22 DIAGNOSIS — E11628 Type 2 diabetes mellitus with other skin complications: Secondary | ICD-10-CM | POA: Diagnosis not present

## 2017-09-22 DIAGNOSIS — I129 Hypertensive chronic kidney disease with stage 1 through stage 4 chronic kidney disease, or unspecified chronic kidney disease: Secondary | ICD-10-CM | POA: Diagnosis not present

## 2017-09-22 DIAGNOSIS — J441 Chronic obstructive pulmonary disease with (acute) exacerbation: Secondary | ICD-10-CM | POA: Diagnosis not present

## 2017-09-22 DIAGNOSIS — I872 Venous insufficiency (chronic) (peripheral): Secondary | ICD-10-CM | POA: Diagnosis not present

## 2017-09-22 DIAGNOSIS — N183 Chronic kidney disease, stage 3 (moderate): Secondary | ICD-10-CM | POA: Diagnosis not present

## 2017-09-22 DIAGNOSIS — M810 Age-related osteoporosis without current pathological fracture: Secondary | ICD-10-CM | POA: Diagnosis not present

## 2017-09-23 DIAGNOSIS — I129 Hypertensive chronic kidney disease with stage 1 through stage 4 chronic kidney disease, or unspecified chronic kidney disease: Secondary | ICD-10-CM | POA: Diagnosis not present

## 2017-09-23 DIAGNOSIS — E11628 Type 2 diabetes mellitus with other skin complications: Secondary | ICD-10-CM | POA: Diagnosis not present

## 2017-09-23 DIAGNOSIS — N183 Chronic kidney disease, stage 3 (moderate): Secondary | ICD-10-CM | POA: Diagnosis not present

## 2017-09-23 DIAGNOSIS — J441 Chronic obstructive pulmonary disease with (acute) exacerbation: Secondary | ICD-10-CM | POA: Diagnosis not present

## 2017-09-23 DIAGNOSIS — I872 Venous insufficiency (chronic) (peripheral): Secondary | ICD-10-CM | POA: Diagnosis not present

## 2017-09-23 DIAGNOSIS — M810 Age-related osteoporosis without current pathological fracture: Secondary | ICD-10-CM | POA: Diagnosis not present

## 2017-09-24 DIAGNOSIS — M810 Age-related osteoporosis without current pathological fracture: Secondary | ICD-10-CM | POA: Diagnosis not present

## 2017-09-24 DIAGNOSIS — N183 Chronic kidney disease, stage 3 (moderate): Secondary | ICD-10-CM | POA: Diagnosis not present

## 2017-09-24 DIAGNOSIS — J441 Chronic obstructive pulmonary disease with (acute) exacerbation: Secondary | ICD-10-CM | POA: Diagnosis not present

## 2017-09-24 DIAGNOSIS — E11628 Type 2 diabetes mellitus with other skin complications: Secondary | ICD-10-CM | POA: Diagnosis not present

## 2017-09-24 DIAGNOSIS — I129 Hypertensive chronic kidney disease with stage 1 through stage 4 chronic kidney disease, or unspecified chronic kidney disease: Secondary | ICD-10-CM | POA: Diagnosis not present

## 2017-09-24 DIAGNOSIS — I872 Venous insufficiency (chronic) (peripheral): Secondary | ICD-10-CM | POA: Diagnosis not present

## 2017-09-28 DIAGNOSIS — J441 Chronic obstructive pulmonary disease with (acute) exacerbation: Secondary | ICD-10-CM | POA: Diagnosis not present

## 2017-09-28 DIAGNOSIS — I129 Hypertensive chronic kidney disease with stage 1 through stage 4 chronic kidney disease, or unspecified chronic kidney disease: Secondary | ICD-10-CM | POA: Diagnosis not present

## 2017-09-28 DIAGNOSIS — M810 Age-related osteoporosis without current pathological fracture: Secondary | ICD-10-CM | POA: Diagnosis not present

## 2017-09-28 DIAGNOSIS — N183 Chronic kidney disease, stage 3 (moderate): Secondary | ICD-10-CM | POA: Diagnosis not present

## 2017-09-28 DIAGNOSIS — I872 Venous insufficiency (chronic) (peripheral): Secondary | ICD-10-CM | POA: Diagnosis not present

## 2017-09-28 DIAGNOSIS — E11628 Type 2 diabetes mellitus with other skin complications: Secondary | ICD-10-CM | POA: Diagnosis not present

## 2017-09-30 DIAGNOSIS — M810 Age-related osteoporosis without current pathological fracture: Secondary | ICD-10-CM | POA: Diagnosis not present

## 2017-09-30 DIAGNOSIS — N183 Chronic kidney disease, stage 3 (moderate): Secondary | ICD-10-CM | POA: Diagnosis not present

## 2017-09-30 DIAGNOSIS — R609 Edema, unspecified: Secondary | ICD-10-CM | POA: Diagnosis not present

## 2017-09-30 DIAGNOSIS — R0902 Hypoxemia: Secondary | ICD-10-CM | POA: Diagnosis not present

## 2017-09-30 DIAGNOSIS — I872 Venous insufficiency (chronic) (peripheral): Secondary | ICD-10-CM | POA: Diagnosis not present

## 2017-09-30 DIAGNOSIS — I129 Hypertensive chronic kidney disease with stage 1 through stage 4 chronic kidney disease, or unspecified chronic kidney disease: Secondary | ICD-10-CM | POA: Diagnosis not present

## 2017-09-30 DIAGNOSIS — E11628 Type 2 diabetes mellitus with other skin complications: Secondary | ICD-10-CM | POA: Diagnosis not present

## 2017-09-30 DIAGNOSIS — J449 Chronic obstructive pulmonary disease, unspecified: Secondary | ICD-10-CM | POA: Diagnosis not present

## 2017-09-30 DIAGNOSIS — I679 Cerebrovascular disease, unspecified: Secondary | ICD-10-CM | POA: Diagnosis not present

## 2017-09-30 DIAGNOSIS — D649 Anemia, unspecified: Secondary | ICD-10-CM | POA: Diagnosis not present

## 2017-09-30 DIAGNOSIS — J441 Chronic obstructive pulmonary disease with (acute) exacerbation: Secondary | ICD-10-CM | POA: Diagnosis not present

## 2017-09-30 DIAGNOSIS — E1142 Type 2 diabetes mellitus with diabetic polyneuropathy: Secondary | ICD-10-CM | POA: Diagnosis not present

## 2017-10-01 DIAGNOSIS — M810 Age-related osteoporosis without current pathological fracture: Secondary | ICD-10-CM | POA: Diagnosis not present

## 2017-10-01 DIAGNOSIS — E11628 Type 2 diabetes mellitus with other skin complications: Secondary | ICD-10-CM | POA: Diagnosis not present

## 2017-10-01 DIAGNOSIS — N183 Chronic kidney disease, stage 3 (moderate): Secondary | ICD-10-CM | POA: Diagnosis not present

## 2017-10-01 DIAGNOSIS — J441 Chronic obstructive pulmonary disease with (acute) exacerbation: Secondary | ICD-10-CM | POA: Diagnosis not present

## 2017-10-01 DIAGNOSIS — I129 Hypertensive chronic kidney disease with stage 1 through stage 4 chronic kidney disease, or unspecified chronic kidney disease: Secondary | ICD-10-CM | POA: Diagnosis not present

## 2017-10-01 DIAGNOSIS — I872 Venous insufficiency (chronic) (peripheral): Secondary | ICD-10-CM | POA: Diagnosis not present

## 2017-10-02 DIAGNOSIS — N183 Chronic kidney disease, stage 3 (moderate): Secondary | ICD-10-CM | POA: Diagnosis not present

## 2017-10-02 DIAGNOSIS — J441 Chronic obstructive pulmonary disease with (acute) exacerbation: Secondary | ICD-10-CM | POA: Diagnosis not present

## 2017-10-02 DIAGNOSIS — E11628 Type 2 diabetes mellitus with other skin complications: Secondary | ICD-10-CM | POA: Diagnosis not present

## 2017-10-02 DIAGNOSIS — I129 Hypertensive chronic kidney disease with stage 1 through stage 4 chronic kidney disease, or unspecified chronic kidney disease: Secondary | ICD-10-CM | POA: Diagnosis not present

## 2017-10-02 DIAGNOSIS — I872 Venous insufficiency (chronic) (peripheral): Secondary | ICD-10-CM | POA: Diagnosis not present

## 2017-10-02 DIAGNOSIS — M810 Age-related osteoporosis without current pathological fracture: Secondary | ICD-10-CM | POA: Diagnosis not present

## 2017-10-03 ENCOUNTER — Encounter: Payer: Self-pay | Admitting: Internal Medicine

## 2017-10-04 DIAGNOSIS — F17221 Nicotine dependence, chewing tobacco, in remission: Secondary | ICD-10-CM | POA: Diagnosis not present

## 2017-10-04 DIAGNOSIS — J441 Chronic obstructive pulmonary disease with (acute) exacerbation: Secondary | ICD-10-CM | POA: Diagnosis not present

## 2017-10-04 DIAGNOSIS — F0390 Unspecified dementia without behavioral disturbance: Secondary | ICD-10-CM | POA: Diagnosis not present

## 2017-10-04 DIAGNOSIS — Z7984 Long term (current) use of oral hypoglycemic drugs: Secondary | ICD-10-CM | POA: Diagnosis not present

## 2017-10-04 DIAGNOSIS — M810 Age-related osteoporosis without current pathological fracture: Secondary | ICD-10-CM | POA: Diagnosis not present

## 2017-10-04 DIAGNOSIS — R339 Retention of urine, unspecified: Secondary | ICD-10-CM | POA: Diagnosis not present

## 2017-10-04 DIAGNOSIS — E11628 Type 2 diabetes mellitus with other skin complications: Secondary | ICD-10-CM | POA: Diagnosis not present

## 2017-10-04 DIAGNOSIS — Z9981 Dependence on supplemental oxygen: Secondary | ICD-10-CM | POA: Diagnosis not present

## 2017-10-04 DIAGNOSIS — D631 Anemia in chronic kidney disease: Secondary | ICD-10-CM | POA: Diagnosis not present

## 2017-10-04 DIAGNOSIS — I129 Hypertensive chronic kidney disease with stage 1 through stage 4 chronic kidney disease, or unspecified chronic kidney disease: Secondary | ICD-10-CM | POA: Diagnosis not present

## 2017-10-04 DIAGNOSIS — Z7951 Long term (current) use of inhaled steroids: Secondary | ICD-10-CM | POA: Diagnosis not present

## 2017-10-04 DIAGNOSIS — I872 Venous insufficiency (chronic) (peripheral): Secondary | ICD-10-CM | POA: Diagnosis not present

## 2017-10-04 DIAGNOSIS — Z8701 Personal history of pneumonia (recurrent): Secondary | ICD-10-CM | POA: Diagnosis not present

## 2017-10-04 DIAGNOSIS — N183 Chronic kidney disease, stage 3 (moderate): Secondary | ICD-10-CM | POA: Diagnosis not present

## 2017-10-04 DIAGNOSIS — E1122 Type 2 diabetes mellitus with diabetic chronic kidney disease: Secondary | ICD-10-CM | POA: Diagnosis not present

## 2017-10-04 DIAGNOSIS — G912 (Idiopathic) normal pressure hydrocephalus: Secondary | ICD-10-CM | POA: Diagnosis not present

## 2017-10-04 DIAGNOSIS — Z9181 History of falling: Secondary | ICD-10-CM | POA: Diagnosis not present

## 2017-10-04 DIAGNOSIS — I679 Cerebrovascular disease, unspecified: Secondary | ICD-10-CM | POA: Diagnosis not present

## 2017-10-05 DIAGNOSIS — I129 Hypertensive chronic kidney disease with stage 1 through stage 4 chronic kidney disease, or unspecified chronic kidney disease: Secondary | ICD-10-CM | POA: Diagnosis not present

## 2017-10-05 DIAGNOSIS — N183 Chronic kidney disease, stage 3 (moderate): Secondary | ICD-10-CM | POA: Diagnosis not present

## 2017-10-05 DIAGNOSIS — G912 (Idiopathic) normal pressure hydrocephalus: Secondary | ICD-10-CM | POA: Diagnosis not present

## 2017-10-05 DIAGNOSIS — J441 Chronic obstructive pulmonary disease with (acute) exacerbation: Secondary | ICD-10-CM | POA: Diagnosis not present

## 2017-10-05 DIAGNOSIS — E1122 Type 2 diabetes mellitus with diabetic chronic kidney disease: Secondary | ICD-10-CM | POA: Diagnosis not present

## 2017-10-05 DIAGNOSIS — D631 Anemia in chronic kidney disease: Secondary | ICD-10-CM | POA: Diagnosis not present

## 2017-10-06 DIAGNOSIS — D631 Anemia in chronic kidney disease: Secondary | ICD-10-CM | POA: Diagnosis not present

## 2017-10-06 DIAGNOSIS — I129 Hypertensive chronic kidney disease with stage 1 through stage 4 chronic kidney disease, or unspecified chronic kidney disease: Secondary | ICD-10-CM | POA: Diagnosis not present

## 2017-10-06 DIAGNOSIS — J441 Chronic obstructive pulmonary disease with (acute) exacerbation: Secondary | ICD-10-CM | POA: Diagnosis not present

## 2017-10-06 DIAGNOSIS — E1122 Type 2 diabetes mellitus with diabetic chronic kidney disease: Secondary | ICD-10-CM | POA: Diagnosis not present

## 2017-10-06 DIAGNOSIS — G912 (Idiopathic) normal pressure hydrocephalus: Secondary | ICD-10-CM | POA: Diagnosis not present

## 2017-10-06 DIAGNOSIS — N183 Chronic kidney disease, stage 3 (moderate): Secondary | ICD-10-CM | POA: Diagnosis not present

## 2017-10-07 DIAGNOSIS — G912 (Idiopathic) normal pressure hydrocephalus: Secondary | ICD-10-CM | POA: Diagnosis not present

## 2017-10-07 DIAGNOSIS — J441 Chronic obstructive pulmonary disease with (acute) exacerbation: Secondary | ICD-10-CM | POA: Diagnosis not present

## 2017-10-07 DIAGNOSIS — D631 Anemia in chronic kidney disease: Secondary | ICD-10-CM | POA: Diagnosis not present

## 2017-10-07 DIAGNOSIS — N183 Chronic kidney disease, stage 3 (moderate): Secondary | ICD-10-CM | POA: Diagnosis not present

## 2017-10-07 DIAGNOSIS — E1122 Type 2 diabetes mellitus with diabetic chronic kidney disease: Secondary | ICD-10-CM | POA: Diagnosis not present

## 2017-10-07 DIAGNOSIS — I129 Hypertensive chronic kidney disease with stage 1 through stage 4 chronic kidney disease, or unspecified chronic kidney disease: Secondary | ICD-10-CM | POA: Diagnosis not present

## 2017-10-09 DIAGNOSIS — J441 Chronic obstructive pulmonary disease with (acute) exacerbation: Secondary | ICD-10-CM | POA: Diagnosis not present

## 2017-10-09 DIAGNOSIS — I129 Hypertensive chronic kidney disease with stage 1 through stage 4 chronic kidney disease, or unspecified chronic kidney disease: Secondary | ICD-10-CM | POA: Diagnosis not present

## 2017-10-09 DIAGNOSIS — E1142 Type 2 diabetes mellitus with diabetic polyneuropathy: Secondary | ICD-10-CM | POA: Diagnosis not present

## 2017-10-09 DIAGNOSIS — D631 Anemia in chronic kidney disease: Secondary | ICD-10-CM | POA: Diagnosis not present

## 2017-10-09 DIAGNOSIS — N183 Chronic kidney disease, stage 3 (moderate): Secondary | ICD-10-CM | POA: Diagnosis not present

## 2017-10-09 DIAGNOSIS — E1122 Type 2 diabetes mellitus with diabetic chronic kidney disease: Secondary | ICD-10-CM | POA: Diagnosis not present

## 2017-10-09 DIAGNOSIS — G912 (Idiopathic) normal pressure hydrocephalus: Secondary | ICD-10-CM | POA: Diagnosis not present

## 2017-10-12 DIAGNOSIS — J441 Chronic obstructive pulmonary disease with (acute) exacerbation: Secondary | ICD-10-CM | POA: Diagnosis not present

## 2017-10-12 DIAGNOSIS — E1122 Type 2 diabetes mellitus with diabetic chronic kidney disease: Secondary | ICD-10-CM | POA: Diagnosis not present

## 2017-10-12 DIAGNOSIS — I129 Hypertensive chronic kidney disease with stage 1 through stage 4 chronic kidney disease, or unspecified chronic kidney disease: Secondary | ICD-10-CM | POA: Diagnosis not present

## 2017-10-12 DIAGNOSIS — N183 Chronic kidney disease, stage 3 (moderate): Secondary | ICD-10-CM | POA: Diagnosis not present

## 2017-10-12 DIAGNOSIS — G912 (Idiopathic) normal pressure hydrocephalus: Secondary | ICD-10-CM | POA: Diagnosis not present

## 2017-10-12 DIAGNOSIS — D631 Anemia in chronic kidney disease: Secondary | ICD-10-CM | POA: Diagnosis not present

## 2017-10-13 DIAGNOSIS — I129 Hypertensive chronic kidney disease with stage 1 through stage 4 chronic kidney disease, or unspecified chronic kidney disease: Secondary | ICD-10-CM | POA: Diagnosis not present

## 2017-10-13 DIAGNOSIS — E1122 Type 2 diabetes mellitus with diabetic chronic kidney disease: Secondary | ICD-10-CM | POA: Diagnosis not present

## 2017-10-13 DIAGNOSIS — D631 Anemia in chronic kidney disease: Secondary | ICD-10-CM | POA: Diagnosis not present

## 2017-10-13 DIAGNOSIS — N183 Chronic kidney disease, stage 3 (moderate): Secondary | ICD-10-CM | POA: Diagnosis not present

## 2017-10-13 DIAGNOSIS — G912 (Idiopathic) normal pressure hydrocephalus: Secondary | ICD-10-CM | POA: Diagnosis not present

## 2017-10-13 DIAGNOSIS — J441 Chronic obstructive pulmonary disease with (acute) exacerbation: Secondary | ICD-10-CM | POA: Diagnosis not present

## 2017-10-14 DIAGNOSIS — D631 Anemia in chronic kidney disease: Secondary | ICD-10-CM | POA: Diagnosis not present

## 2017-10-14 DIAGNOSIS — G912 (Idiopathic) normal pressure hydrocephalus: Secondary | ICD-10-CM | POA: Diagnosis not present

## 2017-10-14 DIAGNOSIS — N183 Chronic kidney disease, stage 3 (moderate): Secondary | ICD-10-CM | POA: Diagnosis not present

## 2017-10-14 DIAGNOSIS — I129 Hypertensive chronic kidney disease with stage 1 through stage 4 chronic kidney disease, or unspecified chronic kidney disease: Secondary | ICD-10-CM | POA: Diagnosis not present

## 2017-10-14 DIAGNOSIS — E1122 Type 2 diabetes mellitus with diabetic chronic kidney disease: Secondary | ICD-10-CM | POA: Diagnosis not present

## 2017-10-14 DIAGNOSIS — J441 Chronic obstructive pulmonary disease with (acute) exacerbation: Secondary | ICD-10-CM | POA: Diagnosis not present

## 2017-10-17 DIAGNOSIS — D631 Anemia in chronic kidney disease: Secondary | ICD-10-CM | POA: Diagnosis not present

## 2017-10-17 DIAGNOSIS — J441 Chronic obstructive pulmonary disease with (acute) exacerbation: Secondary | ICD-10-CM | POA: Diagnosis not present

## 2017-10-17 DIAGNOSIS — E1122 Type 2 diabetes mellitus with diabetic chronic kidney disease: Secondary | ICD-10-CM | POA: Diagnosis not present

## 2017-10-17 DIAGNOSIS — N183 Chronic kidney disease, stage 3 (moderate): Secondary | ICD-10-CM | POA: Diagnosis not present

## 2017-10-17 DIAGNOSIS — I129 Hypertensive chronic kidney disease with stage 1 through stage 4 chronic kidney disease, or unspecified chronic kidney disease: Secondary | ICD-10-CM | POA: Diagnosis not present

## 2017-10-17 DIAGNOSIS — G912 (Idiopathic) normal pressure hydrocephalus: Secondary | ICD-10-CM | POA: Diagnosis not present

## 2017-10-18 DIAGNOSIS — L03312 Cellulitis of back [any part except buttock]: Secondary | ICD-10-CM | POA: Diagnosis not present

## 2017-10-19 DIAGNOSIS — G912 (Idiopathic) normal pressure hydrocephalus: Secondary | ICD-10-CM | POA: Diagnosis not present

## 2017-10-19 DIAGNOSIS — J441 Chronic obstructive pulmonary disease with (acute) exacerbation: Secondary | ICD-10-CM | POA: Diagnosis not present

## 2017-10-19 DIAGNOSIS — I129 Hypertensive chronic kidney disease with stage 1 through stage 4 chronic kidney disease, or unspecified chronic kidney disease: Secondary | ICD-10-CM | POA: Diagnosis not present

## 2017-10-19 DIAGNOSIS — D631 Anemia in chronic kidney disease: Secondary | ICD-10-CM | POA: Diagnosis not present

## 2017-10-19 DIAGNOSIS — N183 Chronic kidney disease, stage 3 (moderate): Secondary | ICD-10-CM | POA: Diagnosis not present

## 2017-10-19 DIAGNOSIS — E1122 Type 2 diabetes mellitus with diabetic chronic kidney disease: Secondary | ICD-10-CM | POA: Diagnosis not present

## 2017-10-21 DIAGNOSIS — J441 Chronic obstructive pulmonary disease with (acute) exacerbation: Secondary | ICD-10-CM | POA: Diagnosis not present

## 2017-10-21 DIAGNOSIS — G912 (Idiopathic) normal pressure hydrocephalus: Secondary | ICD-10-CM | POA: Diagnosis not present

## 2017-10-21 DIAGNOSIS — I129 Hypertensive chronic kidney disease with stage 1 through stage 4 chronic kidney disease, or unspecified chronic kidney disease: Secondary | ICD-10-CM | POA: Diagnosis not present

## 2017-10-21 DIAGNOSIS — D631 Anemia in chronic kidney disease: Secondary | ICD-10-CM | POA: Diagnosis not present

## 2017-10-21 DIAGNOSIS — N183 Chronic kidney disease, stage 3 (moderate): Secondary | ICD-10-CM | POA: Diagnosis not present

## 2017-10-21 DIAGNOSIS — E1122 Type 2 diabetes mellitus with diabetic chronic kidney disease: Secondary | ICD-10-CM | POA: Diagnosis not present

## 2017-10-22 DIAGNOSIS — I129 Hypertensive chronic kidney disease with stage 1 through stage 4 chronic kidney disease, or unspecified chronic kidney disease: Secondary | ICD-10-CM | POA: Diagnosis not present

## 2017-10-22 DIAGNOSIS — G912 (Idiopathic) normal pressure hydrocephalus: Secondary | ICD-10-CM | POA: Diagnosis not present

## 2017-10-22 DIAGNOSIS — J441 Chronic obstructive pulmonary disease with (acute) exacerbation: Secondary | ICD-10-CM | POA: Diagnosis not present

## 2017-10-22 DIAGNOSIS — E1122 Type 2 diabetes mellitus with diabetic chronic kidney disease: Secondary | ICD-10-CM | POA: Diagnosis not present

## 2017-10-22 DIAGNOSIS — N183 Chronic kidney disease, stage 3 (moderate): Secondary | ICD-10-CM | POA: Diagnosis not present

## 2017-10-22 DIAGNOSIS — D631 Anemia in chronic kidney disease: Secondary | ICD-10-CM | POA: Diagnosis not present

## 2017-10-24 DIAGNOSIS — D631 Anemia in chronic kidney disease: Secondary | ICD-10-CM | POA: Diagnosis not present

## 2017-10-24 DIAGNOSIS — E1122 Type 2 diabetes mellitus with diabetic chronic kidney disease: Secondary | ICD-10-CM | POA: Diagnosis not present

## 2017-10-24 DIAGNOSIS — I129 Hypertensive chronic kidney disease with stage 1 through stage 4 chronic kidney disease, or unspecified chronic kidney disease: Secondary | ICD-10-CM | POA: Diagnosis not present

## 2017-10-24 DIAGNOSIS — G912 (Idiopathic) normal pressure hydrocephalus: Secondary | ICD-10-CM | POA: Diagnosis not present

## 2017-10-24 DIAGNOSIS — N183 Chronic kidney disease, stage 3 (moderate): Secondary | ICD-10-CM | POA: Diagnosis not present

## 2017-10-24 DIAGNOSIS — J441 Chronic obstructive pulmonary disease with (acute) exacerbation: Secondary | ICD-10-CM | POA: Diagnosis not present

## 2017-10-26 DIAGNOSIS — I129 Hypertensive chronic kidney disease with stage 1 through stage 4 chronic kidney disease, or unspecified chronic kidney disease: Secondary | ICD-10-CM | POA: Diagnosis not present

## 2017-10-26 DIAGNOSIS — E1122 Type 2 diabetes mellitus with diabetic chronic kidney disease: Secondary | ICD-10-CM | POA: Diagnosis not present

## 2017-10-26 DIAGNOSIS — J441 Chronic obstructive pulmonary disease with (acute) exacerbation: Secondary | ICD-10-CM | POA: Diagnosis not present

## 2017-10-26 DIAGNOSIS — N183 Chronic kidney disease, stage 3 (moderate): Secondary | ICD-10-CM | POA: Diagnosis not present

## 2017-10-26 DIAGNOSIS — D631 Anemia in chronic kidney disease: Secondary | ICD-10-CM | POA: Diagnosis not present

## 2017-10-26 DIAGNOSIS — G912 (Idiopathic) normal pressure hydrocephalus: Secondary | ICD-10-CM | POA: Diagnosis not present

## 2017-10-28 DIAGNOSIS — G912 (Idiopathic) normal pressure hydrocephalus: Secondary | ICD-10-CM | POA: Diagnosis not present

## 2017-10-28 DIAGNOSIS — D631 Anemia in chronic kidney disease: Secondary | ICD-10-CM | POA: Diagnosis not present

## 2017-10-28 DIAGNOSIS — J441 Chronic obstructive pulmonary disease with (acute) exacerbation: Secondary | ICD-10-CM | POA: Diagnosis not present

## 2017-10-28 DIAGNOSIS — N183 Chronic kidney disease, stage 3 (moderate): Secondary | ICD-10-CM | POA: Diagnosis not present

## 2017-10-28 DIAGNOSIS — I129 Hypertensive chronic kidney disease with stage 1 through stage 4 chronic kidney disease, or unspecified chronic kidney disease: Secondary | ICD-10-CM | POA: Diagnosis not present

## 2017-10-28 DIAGNOSIS — G479 Sleep disorder, unspecified: Secondary | ICD-10-CM | POA: Diagnosis not present

## 2017-10-28 DIAGNOSIS — D485 Neoplasm of uncertain behavior of skin: Secondary | ICD-10-CM | POA: Diagnosis not present

## 2017-10-28 DIAGNOSIS — E1122 Type 2 diabetes mellitus with diabetic chronic kidney disease: Secondary | ICD-10-CM | POA: Diagnosis not present

## 2017-10-28 DIAGNOSIS — M25559 Pain in unspecified hip: Secondary | ICD-10-CM | POA: Diagnosis not present

## 2017-11-02 ENCOUNTER — Emergency Department (HOSPITAL_COMMUNITY): Payer: Medicare Other

## 2017-11-02 ENCOUNTER — Other Ambulatory Visit: Payer: Self-pay

## 2017-11-02 ENCOUNTER — Encounter (HOSPITAL_COMMUNITY): Payer: Self-pay | Admitting: Emergency Medicine

## 2017-11-02 ENCOUNTER — Inpatient Hospital Stay (HOSPITAL_COMMUNITY)
Admission: EM | Admit: 2017-11-02 | Discharge: 2017-11-04 | DRG: 071 | Disposition: A | Discharge status: Home Health | Payer: Medicare Other | Attending: Family Medicine | Admitting: Family Medicine

## 2017-11-02 DIAGNOSIS — F0390 Unspecified dementia without behavioral disturbance: Secondary | ICD-10-CM | POA: Diagnosis present

## 2017-11-02 DIAGNOSIS — R531 Weakness: Secondary | ICD-10-CM | POA: Diagnosis not present

## 2017-11-02 DIAGNOSIS — I959 Hypotension, unspecified: Secondary | ICD-10-CM | POA: Diagnosis not present

## 2017-11-02 DIAGNOSIS — M4856XA Collapsed vertebra, not elsewhere classified, lumbar region, initial encounter for fracture: Secondary | ICD-10-CM | POA: Diagnosis present

## 2017-11-02 DIAGNOSIS — M549 Dorsalgia, unspecified: Secondary | ICD-10-CM

## 2017-11-02 DIAGNOSIS — I471 Supraventricular tachycardia: Secondary | ICD-10-CM | POA: Diagnosis not present

## 2017-11-02 DIAGNOSIS — Z882 Allergy status to sulfonamides status: Secondary | ICD-10-CM

## 2017-11-02 DIAGNOSIS — R05 Cough: Secondary | ICD-10-CM | POA: Diagnosis not present

## 2017-11-02 DIAGNOSIS — Z9010 Acquired absence of unspecified breast and nipple: Secondary | ICD-10-CM

## 2017-11-02 DIAGNOSIS — Z9049 Acquired absence of other specified parts of digestive tract: Secondary | ICD-10-CM

## 2017-11-02 DIAGNOSIS — Z6835 Body mass index (BMI) 35.0-35.9, adult: Secondary | ICD-10-CM

## 2017-11-02 DIAGNOSIS — I10 Essential (primary) hypertension: Secondary | ICD-10-CM | POA: Diagnosis not present

## 2017-11-02 DIAGNOSIS — Z9181 History of falling: Secondary | ICD-10-CM

## 2017-11-02 DIAGNOSIS — E785 Hyperlipidemia, unspecified: Secondary | ICD-10-CM | POA: Diagnosis present

## 2017-11-02 DIAGNOSIS — E1169 Type 2 diabetes mellitus with other specified complication: Secondary | ICD-10-CM | POA: Diagnosis present

## 2017-11-02 DIAGNOSIS — G9341 Metabolic encephalopathy: Secondary | ICD-10-CM | POA: Diagnosis not present

## 2017-11-02 DIAGNOSIS — Z79899 Other long term (current) drug therapy: Secondary | ICD-10-CM

## 2017-11-02 DIAGNOSIS — S3992XA Unspecified injury of lower back, initial encounter: Secondary | ICD-10-CM | POA: Diagnosis not present

## 2017-11-02 DIAGNOSIS — Z9981 Dependence on supplemental oxygen: Secondary | ICD-10-CM

## 2017-11-02 DIAGNOSIS — Z881 Allergy status to other antibiotic agents status: Secondary | ICD-10-CM

## 2017-11-02 DIAGNOSIS — J9611 Chronic respiratory failure with hypoxia: Secondary | ICD-10-CM | POA: Diagnosis present

## 2017-11-02 DIAGNOSIS — S32020A Wedge compression fracture of second lumbar vertebra, initial encounter for closed fracture: Secondary | ICD-10-CM | POA: Diagnosis present

## 2017-11-02 DIAGNOSIS — J449 Chronic obstructive pulmonary disease, unspecified: Secondary | ICD-10-CM | POA: Diagnosis present

## 2017-11-02 DIAGNOSIS — Z66 Do not resuscitate: Secondary | ICD-10-CM | POA: Diagnosis not present

## 2017-11-02 DIAGNOSIS — R630 Anorexia: Secondary | ICD-10-CM | POA: Diagnosis present

## 2017-11-02 DIAGNOSIS — R4182 Altered mental status, unspecified: Secondary | ICD-10-CM | POA: Diagnosis not present

## 2017-11-02 DIAGNOSIS — K219 Gastro-esophageal reflux disease without esophagitis: Secondary | ICD-10-CM | POA: Diagnosis present

## 2017-11-02 DIAGNOSIS — Z87891 Personal history of nicotine dependence: Secondary | ICD-10-CM

## 2017-11-02 DIAGNOSIS — Z823 Family history of stroke: Secondary | ICD-10-CM

## 2017-11-02 DIAGNOSIS — R300 Dysuria: Secondary | ICD-10-CM

## 2017-11-02 DIAGNOSIS — G912 (Idiopathic) normal pressure hydrocephalus: Secondary | ICD-10-CM | POA: Diagnosis not present

## 2017-11-02 DIAGNOSIS — Z7951 Long term (current) use of inhaled steroids: Secondary | ICD-10-CM

## 2017-11-02 DIAGNOSIS — S3991XA Unspecified injury of abdomen, initial encounter: Secondary | ICD-10-CM | POA: Diagnosis not present

## 2017-11-02 DIAGNOSIS — Z7984 Long term (current) use of oral hypoglycemic drugs: Secondary | ICD-10-CM

## 2017-11-02 DIAGNOSIS — Z853 Personal history of malignant neoplasm of breast: Secondary | ICD-10-CM

## 2017-11-02 DIAGNOSIS — Z888 Allergy status to other drugs, medicaments and biological substances status: Secondary | ICD-10-CM

## 2017-11-02 DIAGNOSIS — Z8249 Family history of ischemic heart disease and other diseases of the circulatory system: Secondary | ICD-10-CM

## 2017-11-02 DIAGNOSIS — Z91048 Other nonmedicinal substance allergy status: Secondary | ICD-10-CM

## 2017-11-02 DIAGNOSIS — G8929 Other chronic pain: Secondary | ICD-10-CM | POA: Diagnosis present

## 2017-11-02 DIAGNOSIS — D509 Iron deficiency anemia, unspecified: Secondary | ICD-10-CM | POA: Diagnosis present

## 2017-11-02 DIAGNOSIS — M4855XD Collapsed vertebra, not elsewhere classified, thoracolumbar region, subsequent encounter for fracture with routine healing: Secondary | ICD-10-CM | POA: Diagnosis present

## 2017-11-02 DIAGNOSIS — E119 Type 2 diabetes mellitus without complications: Secondary | ICD-10-CM

## 2017-11-02 DIAGNOSIS — R410 Disorientation, unspecified: Secondary | ICD-10-CM

## 2017-11-02 DIAGNOSIS — M545 Low back pain: Secondary | ICD-10-CM | POA: Diagnosis not present

## 2017-11-02 DIAGNOSIS — R296 Repeated falls: Secondary | ICD-10-CM | POA: Diagnosis present

## 2017-11-02 DIAGNOSIS — Z833 Family history of diabetes mellitus: Secondary | ICD-10-CM

## 2017-11-02 LAB — CBC WITH DIFFERENTIAL/PLATELET
ABS IMMATURE GRANULOCYTES: 0 10*3/uL (ref 0.0–0.1)
Basophils Absolute: 0 10*3/uL (ref 0.0–0.1)
Basophils Relative: 0 %
Eosinophils Absolute: 0.1 10*3/uL (ref 0.0–0.7)
Eosinophils Relative: 2 %
HEMATOCRIT: 34 % — AB (ref 36.0–46.0)
HEMOGLOBIN: 10.5 g/dL — AB (ref 12.0–15.0)
Immature Granulocytes: 0 %
LYMPHS ABS: 2.2 10*3/uL (ref 0.7–4.0)
LYMPHS PCT: 32 %
MCH: 26.9 pg (ref 26.0–34.0)
MCHC: 30.9 g/dL (ref 30.0–36.0)
MCV: 87 fL (ref 78.0–100.0)
MONOS PCT: 8 %
Monocytes Absolute: 0.6 10*3/uL (ref 0.1–1.0)
NEUTROS ABS: 4 10*3/uL (ref 1.7–7.7)
NEUTROS PCT: 58 %
PLATELETS: 223 10*3/uL (ref 150–400)
RBC: 3.91 MIL/uL (ref 3.87–5.11)
RDW: 15.7 % — ABNORMAL HIGH (ref 11.5–15.5)
WBC: 6.9 10*3/uL (ref 4.0–10.5)

## 2017-11-02 LAB — COMPREHENSIVE METABOLIC PANEL
ALBUMIN: 3.7 g/dL (ref 3.5–5.0)
ALT: 15 U/L (ref 0–44)
ANION GAP: 9 (ref 5–15)
AST: 41 U/L (ref 15–41)
Alkaline Phosphatase: 85 U/L (ref 38–126)
BUN: 16 mg/dL (ref 8–23)
CHLORIDE: 98 mmol/L (ref 98–111)
CO2: 28 mmol/L (ref 22–32)
Calcium: 9.2 mg/dL (ref 8.9–10.3)
Creatinine, Ser: 0.81 mg/dL (ref 0.44–1.00)
GFR calc non Af Amer: >60 mL/min (ref >60)
GLUCOSE: 79 mg/dL (ref 70–99)
Potassium: 4.7 mmol/L (ref 3.5–5.1)
SODIUM: 135 mmol/L (ref 135–145)
Total Bilirubin: 0.8 mg/dL (ref 0.3–1.2)
Total Protein: 6.5 g/dL (ref 6.5–8.1)

## 2017-11-02 LAB — URINALYSIS, ROUTINE W REFLEX MICROSCOPIC
Bilirubin Urine: NEGATIVE
GLUCOSE, UA: NEGATIVE mg/dL
Hgb urine dipstick: NEGATIVE
Ketones, ur: NEGATIVE mg/dL
LEUKOCYTES UA: NEGATIVE
Nitrite: NEGATIVE
PH: 8 (ref 5.0–8.0)
PROTEIN: NEGATIVE mg/dL
SPECIFIC GRAVITY, URINE: 1.006 (ref 1.005–1.030)

## 2017-11-02 LAB — I-STAT VENOUS BLOOD GAS, ED
Acid-Base Excess: 5 mmol/L — ABNORMAL HIGH (ref 0.0–2.0)
Bicarbonate: 31.2 mmol/L — ABNORMAL HIGH (ref 20.0–28.0)
O2 SAT: 67 %
PCO2 VEN: 54.1 mmHg (ref 44.0–60.0)
PO2 VEN: 37 mmHg (ref 32.0–45.0)
TCO2: 33 mmol/L — ABNORMAL HIGH (ref 22–32)
pH, Ven: 7.368 (ref 7.250–7.430)

## 2017-11-02 MED ORDER — SODIUM CHLORIDE 0.9 % IV BOLUS
500.0000 mL | Freq: Once | INTRAVENOUS | Status: AC
  Administered 2017-11-02: 500 mL via INTRAVENOUS

## 2017-11-02 NOTE — ED Notes (Signed)
ED Provider at bedside. 

## 2017-11-02 NOTE — ED Provider Notes (Addendum)
[facility] EMERGENCY DEPARTMENT Provider Note   CSN: [id number] Arrival date & time: 11/02/17  1758  History   Chief Complaint Chief Complaint  Patient presents with  . Altered Mental Status  . Weakness   HPI  Patient is a 84 year old female with history of COPD, hypertension, DM, and NPH presenting to the ED for altered mental status.  She lives at home with daughter.  Per family, she became confused this afternoon.  She reportedly took off her pants and stated she needed to wipe her buttocks when she was in her room.  She has had worsening generalized soreness and intermittent confusion over the last few days as well.  She is also planing of burning with urination.  Chronic cough.  She has had worsening BLE pain over the last 2 weeks which is diffuse.  No recent falls.  No fever, vomiting, diarrhea, or other recent illness.  Past Medical History:  Diagnosis Date  . Anemia   . Breast cancer ([place]) 1994  . COPD (chronic obstructive pulmonary disease) ([place])   . Diabetes mellitus without complication ([place])    diet controlled vs. on glimepride  . Hypertension   . NPH (normal pressure hydrocephalus)   . Osteoporosis     Patient Active Problem List   Diagnosis Date Noted  . Hyperlipidemia 09/20/2017  . Paroxysmal SVT (supraventricular tachycardia) ([place]) 08/17/2017  . Chronic back pain 08/17/2017  . Encounter for palliative care   . Goals of care, counseling/discussion   . Acute urinary retention 08/13/2017  . Frequent falls 08/13/2017  . DM2 (diabetes mellitus, type 2) ([place]) 08/13/2017  . Pressure injury of skin 08/13/2017  . CAP (community acquired pneumonia) 08/12/2017  . Acute respiratory failure with hypoxia ([place]) 06/20/2017  . Accident due to mechanical fall without injury 06/20/2017  . Dementia without behavioral disturbance 06/20/2017  . Confusion 06/20/2017  . Hyperlipidemia associated with type 2 diabetes mellitus ([place]) 06/20/2017  . GERD  (gastroesophageal reflux disease) 06/20/2017  . Nodule of middle lobe of right lung 06/20/2017  . COPD with acute exacerbation ([place]) 06/15/2017  . Fall at home, initial encounter 06/15/2017  . COPD (chronic obstructive pulmonary disease) ([place]) 12/31/2016  . History of breast cancer 12/31/2016  . HTN (hypertension) 12/31/2016  . Hemorrhoids 12/31/2016  . Nodule of left lung 12/31/2016  . Lower GI bleed 12/30/2016  . Memory loss 10/22/2012    Past Surgical History:  Procedure Laterality Date  . CHOLECYSTECTOMY    . COLONOSCOPY WITH PROPOFOL N/A 01/01/2017   Procedure: COLONOSCOPY WITH PROPOFOL;  Surgeon: Laurence Spates, MD;  Location: [facility] ENDOSCOPY;  Service: Endoscopy;  Laterality: N/A;  . MASTECTOMY  1994  . TUBAL LIGATION       OB History   None      Home Medications    Prior to Admission medications   Medication Sig Start Date End Date Taking? Authorizing Provider  acetaminophen (TYLENOL) 500 MG tablet Take 1,000 mg by mouth every 6 (six) hours.    Yes [provider]  albuterol (PROVENTIL HFA;VENTOLIN HFA) 108 (90 Base) MCG/ACT inhaler Inhale 2 puffs into the lungs every 6 (six) hours as needed for wheezing or shortness of breath.    Yes [provider]  atorvastatin (LIPITOR) 10 MG tablet Take 10 mg by mouth daily.   Yes [provider]  budesonide-formoterol (SYMBICORT) 80-4.5 MCG/ACT inhaler Inhale 2 puffs into the lungs 2 (two) times daily.   Yes [provider]  calcium gluconate 500 MG tablet  Take 1 tablet by mouth daily.   Yes [provider]  cholecalciferol (VITAMIN D) 1000 units tablet Take 1,000 Units by mouth daily.   Yes [provider]  diltiazem (CARDIZEM CD) 180 MG 24 hr capsule Take 180 mg by mouth daily.   Yes [provider]  fexofenadine (ALLEGRA) 180 MG tablet Take 180 mg by mouth daily.   Yes [provider]  fluticasone (FLONASE) 50 MCG/ACT nasal spray Place 2 sprays into both  nostrils daily.   Yes [provider]  furosemide (LASIX) 40 MG tablet Take 40 mg by mouth daily as needed for fluid or edema.    Yes [provider]  gabapentin (NEURONTIN) 300 MG capsule Take 300 mg by mouth at bedtime.    Yes [provider]  glimepiride (AMARYL) 1 MG tablet Take 1 mg by mouth See admin instructions. Take 1 mg by mouth once a day only if BGL is >150, per provider   Yes [provider]  ipratropium-albuterol (DUONEB) 0.5-2.5 (3) MG/3ML SOLN Take 3 mLs by nebulization 4 (four) times daily.    Yes [provider]  iron polysaccharides (NIFEREX) 150 MG capsule Take 150 mg by mouth 2 (two) times daily.    Yes [provider]  l-methylfolate-B6-B12 (METANX) 3-35-2 MG TABS tablet Take 1 tablet by mouth 2 (two) times daily.    Yes [provider]  Lidocaine (ASPERCREME LIDOCAINE) 4 % PTCH Apply 1 patch topically daily as needed (for back pain).    Yes [provider]  lisinopril (PRINIVIL,ZESTRIL) 10 MG tablet Take 10 mg by mouth daily.   Yes [provider]  Magnesium 250 MG TABS Take 250 mg by mouth at bedtime.   Yes [provider]  memantine (NAMENDA) 10 MG tablet Take 10 mg by mouth 2 (two) times daily.   Yes [provider]  Omega-3 Fatty Acids (FISH OIL) 1000 MG CAPS Take 1,000 mg by mouth daily.   Yes [provider]  OXYGEN Inhale 2 L into the lungs continuous.   Yes [provider]  polyethylene glycol (MIRALAX / GLYCOLAX) packet Take 17 g by mouth daily. Patient taking differently: Take 17 g by mouth daily as needed for mild constipation.  01/01/17  Yes Florencia Reasons, MD  ranitidine (ZANTAC) 150 MG tablet Take 150 mg by mouth 2 (two) times daily.    Yes [provider]  sodium chloride (OCEAN) 0.65 % SOLN nasal spray Place 1 spray into both nostrils as needed for congestion. 08/17/17  Yes Debbe Odea, MD  sucralfate (CARAFATE) 1 g tablet Take 1 g by mouth 2  (two) times daily.   Yes [provider]  traZODone (DESYREL) 50 MG tablet Take 50 mg by mouth at bedtime. 10/28/17  Yes [provider]  acetaminophen (TYLENOL 8 HOUR) 650 MG CR tablet Take 1 tablet (650 mg total) by mouth every 8 (eight) hours as needed. Patient not taking: Reported on 11/02/2017 08/10/17   Varney Biles, MD  guaiFENesin (MUCINEX) 600 MG 12 hr tablet Take 1 tablet (600 mg total) by mouth 2 (two) times daily. Patient not taking: Reported on 11/02/2017 08/17/17   Debbe Odea, MD  lidocaine (LIDODERM) 5 % Place 1 patch onto the skin daily. Remove & Discard patch within 12 hours or as directed by MD Patient not taking: Reported on 11/02/2017 08/17/17   Debbe Odea, MD  nitroGLYCERIN (NITROSTAT) 0.4 MG SL tablet Place 0.4 mg under the tongue every 5 (five) minutes as  needed for chest pain.    [provider]  ondansetron (ZOFRAN) 4 MG tablet Take 1 tablet (4 mg total) by mouth every 6 (six) hours as needed for nausea. Patient not taking: Reported on 11/02/2017 08/17/17   Debbe Odea, MD    Family History Family History  Problem Relation Age of Onset  . Hypertension Mother   . Diabetes Mother   . Heart failure Mother   . Stroke Mother   . Hypertension Father   . Diabetes Father   . Heart failure Father   . Stroke Father     Social History Social History   Tobacco Use  . Smoking status: Never Smoker  . Smokeless tobacco: Former Systems developer    Types: Snuff, Chew  . Tobacco comment: lived with smokers most of her life; oral tobacco for 10 years?  Substance Use Topics  . Alcohol use: No  . Drug use: No     Allergies   Adhesive [tape]; Macrodantin [nitrofurantoin]; Sulfa antibiotics; and Keflex [cephalexin]   Review of Systems Review of Systems  Constitutional: Negative for fever.  HENT: Negative for congestion and sore throat.   Respiratory: Positive for cough. Negative for shortness of breath.   Cardiovascular: Negative for chest pain and leg  swelling.  Gastrointestinal: Negative for abdominal pain, diarrhea and vomiting.  Genitourinary: Positive for dysuria.  Musculoskeletal: Negative for neck pain.  Neurological: Negative for syncope and facial asymmetry.  Psychiatric/Behavioral: Positive for confusion.  All other systems reviewed and are negative.    Physical Exam Updated Vital Signs BP (!) 144/61   Pulse 75   Temp 98 F (36.7 C) (Oral)   Resp 17   Ht 5' (1.524 m)   Wt 82.6 kg (182 lb)   SpO2 100%   BMI 35.54 kg/m   Physical Exam  Constitutional: No distress.  HENT:  Head: Normocephalic and atraumatic.  Mouth/Throat: Oropharynx is clear and moist.  Eyes: Pupils are equal, round, and reactive to light.  Neck: Neck supple. No tracheal deviation present.  Cardiovascular: Normal rate, regular rhythm, normal heart sounds and intact distal pulses.  No murmur heard. Pulmonary/Chest: Effort normal. No stridor. No respiratory distress. She has no wheezes. She has rales (minimal, bibasilar).  Abdominal: Soft. She exhibits no distension and no mass. There is no tenderness. There is no guarding.  Musculoskeletal: She exhibits no edema or deformity.  BLE are warm and well perfused without edema, tenderness, or wounds.  DP pulses are 2+. No midline or paraspinal T or L spine tenderness.  Neurological: She is alert.  Speech is fluent.  No gross facial asymmetry.  Midline tongue protrusion.  MAE.   Skin: Skin is warm and dry.  Nursing note and vitals reviewed.    ED Treatments / Results  Labs (all labs ordered are listed, but only abnormal results are displayed) Labs Reviewed  CBC WITH DIFFERENTIAL/PLATELET - Abnormal; Notable for the following components:      Result Value   Hemoglobin 10.5 (*)    HCT 34.0 (*)    RDW 15.7 (*)    All other components within normal limits  URINALYSIS, ROUTINE W REFLEX MICROSCOPIC - Abnormal; Notable for the following components:   Color, Urine STRAW (*)    All other components  within normal limits  I-STAT VENOUS BLOOD GAS, ED - Abnormal; Notable for the following components:   Bicarbonate 31.2 (*)    TCO2 33 (*)    Acid-Base Excess 5.0 (*)    All other components within  normal limits  URINE CULTURE  COMPREHENSIVE METABOLIC PANEL  BLOOD GAS, VENOUS    EKG None  Radiology Dg Chest 2 View  Result Date: 11/02/2017 CLINICAL DATA:  Altered mental status with cough EXAM: CHEST - 2 VIEW COMPARISON:  08/12/2017, 06/17/2017 FINDINGS: Chronic interstitial opacity. No acute airspace disease or effusion. Mild cardiomegaly. Aortic atherosclerosis. No pneumothorax. Stable multiple compression deformities of the thoracic and lumbar spine. IMPRESSION: No active cardiopulmonary disease. Similar appearance of chronic interstitial disease and mild cardiomegaly. Electronically Signed   By: Donavan Foil M.D.   On: 11/02/2017 19:46    Procedures Procedures (including critical care time)  Medications Ordered in ED Medications  ciprofloxacin (CIPRO) tablet 500 mg (has no administration in time range)  sodium chloride 0.9 % bolus 500 mL (0 mLs Intravenous Stopped 11/02/17 2034)     Initial Impression / Assessment and Plan / ED Course  I have reviewed the triage vital signs and the nursing notes.  Pertinent labs & imaging results that were available during my care of the patient were reviewed by me and considered in my medical decision making (see chart for details).  This is a 84 year old female presenting to the ED for intermittent confusion as above.  Unclear etiology of her altered mental status.  She is alert and answering all questions appropriately here.  Family states that her mental status has improved.  No focal deficits to suggest CVA.  With her burning with urination, we had high suspicion for UTI although her UA is reassuring.  However, with her reported symptoms and altered mental status, will treat with course of Cipro due to her allergies.  Screening labs otherwise  reassuring.  No hypercapnia.  No signs of sepsis.  On recheck, she did have some lower abdominal tenderness for which we will obtain CT abdomen pelvis in addition to her CT head.  Regarding her subacute bilateral lower extremity pain, no signs of ischemia, infection, or other acute process. No back tenderness. Unclear etiology, will obtain CT L-spine.   Care handed off to Ocie Cornfield, [place], at about 0020. Plan is to f/u CT head, abdomen, and L-spine. If all is reassuring, patient appropriate for discharge home on course of Cipro given her improved mental status.  Final Clinical Impressions(s) / ED Diagnoses   Final diagnoses:  Back pain      Prescilla Sours, MD 11/03/17 Jennye Boroughs, MD 11/04/17 [phone]

## 2017-11-02 NOTE — ED Notes (Signed)
Purwick placed

## 2017-11-02 NOTE — ED Triage Notes (Addendum)
Per EMS: Pt from home with family with c/o increased weakness, AMS from baseline, bilateral leg pain, and burning with urination.  Pt takes tylenol q6 hours for leg pain.  Family states pt on home O2 2L nasal cannula.

## 2017-11-03 ENCOUNTER — Other Ambulatory Visit: Payer: Self-pay

## 2017-11-03 ENCOUNTER — Encounter (HOSPITAL_COMMUNITY): Payer: Self-pay | Admitting: *Deleted

## 2017-11-03 DIAGNOSIS — M4856XA Collapsed vertebra, not elsewhere classified, lumbar region, initial encounter for fracture: Secondary | ICD-10-CM | POA: Diagnosis present

## 2017-11-03 DIAGNOSIS — G9341 Metabolic encephalopathy: Secondary | ICD-10-CM | POA: Diagnosis not present

## 2017-11-03 DIAGNOSIS — F0280 Dementia in other diseases classified elsewhere without behavioral disturbance: Secondary | ICD-10-CM | POA: Diagnosis not present

## 2017-11-03 DIAGNOSIS — M4855XD Collapsed vertebra, not elsewhere classified, thoracolumbar region, subsequent encounter for fracture with routine healing: Secondary | ICD-10-CM | POA: Diagnosis present

## 2017-11-03 DIAGNOSIS — Z9981 Dependence on supplemental oxygen: Secondary | ICD-10-CM | POA: Diagnosis not present

## 2017-11-03 DIAGNOSIS — Z7401 Bed confinement status: Secondary | ICD-10-CM | POA: Diagnosis not present

## 2017-11-03 DIAGNOSIS — R4182 Altered mental status, unspecified: Secondary | ICD-10-CM | POA: Diagnosis not present

## 2017-11-03 DIAGNOSIS — E1169 Type 2 diabetes mellitus with other specified complication: Secondary | ICD-10-CM | POA: Diagnosis present

## 2017-11-03 DIAGNOSIS — R300 Dysuria: Secondary | ICD-10-CM | POA: Diagnosis present

## 2017-11-03 DIAGNOSIS — Z87891 Personal history of nicotine dependence: Secondary | ICD-10-CM | POA: Diagnosis not present

## 2017-11-03 DIAGNOSIS — M5441 Lumbago with sciatica, right side: Secondary | ICD-10-CM | POA: Diagnosis not present

## 2017-11-03 DIAGNOSIS — J9611 Chronic respiratory failure with hypoxia: Secondary | ICD-10-CM | POA: Diagnosis present

## 2017-11-03 DIAGNOSIS — Z9181 History of falling: Secondary | ICD-10-CM | POA: Diagnosis not present

## 2017-11-03 DIAGNOSIS — S32020A Wedge compression fracture of second lumbar vertebra, initial encounter for closed fracture: Secondary | ICD-10-CM | POA: Diagnosis not present

## 2017-11-03 DIAGNOSIS — S3991XA Unspecified injury of abdomen, initial encounter: Secondary | ICD-10-CM | POA: Diagnosis not present

## 2017-11-03 DIAGNOSIS — R296 Repeated falls: Secondary | ICD-10-CM | POA: Diagnosis present

## 2017-11-03 DIAGNOSIS — E785 Hyperlipidemia, unspecified: Secondary | ICD-10-CM | POA: Diagnosis not present

## 2017-11-03 DIAGNOSIS — M5442 Lumbago with sciatica, left side: Secondary | ICD-10-CM | POA: Diagnosis not present

## 2017-11-03 DIAGNOSIS — S3992XA Unspecified injury of lower back, initial encounter: Secondary | ICD-10-CM | POA: Diagnosis not present

## 2017-11-03 DIAGNOSIS — M2550 Pain in unspecified joint: Secondary | ICD-10-CM | POA: Diagnosis not present

## 2017-11-03 DIAGNOSIS — G912 (Idiopathic) normal pressure hydrocephalus: Secondary | ICD-10-CM | POA: Diagnosis present

## 2017-11-03 DIAGNOSIS — Z66 Do not resuscitate: Secondary | ICD-10-CM | POA: Diagnosis present

## 2017-11-03 DIAGNOSIS — R630 Anorexia: Secondary | ICD-10-CM | POA: Diagnosis present

## 2017-11-03 DIAGNOSIS — K219 Gastro-esophageal reflux disease without esophagitis: Secondary | ICD-10-CM | POA: Diagnosis not present

## 2017-11-03 DIAGNOSIS — G8929 Other chronic pain: Secondary | ICD-10-CM | POA: Diagnosis not present

## 2017-11-03 DIAGNOSIS — G309 Alzheimer's disease, unspecified: Secondary | ICD-10-CM | POA: Diagnosis not present

## 2017-11-03 DIAGNOSIS — J449 Chronic obstructive pulmonary disease, unspecified: Secondary | ICD-10-CM | POA: Diagnosis not present

## 2017-11-03 DIAGNOSIS — Z853 Personal history of malignant neoplasm of breast: Secondary | ICD-10-CM | POA: Diagnosis not present

## 2017-11-03 DIAGNOSIS — Z6835 Body mass index (BMI) 35.0-35.9, adult: Secondary | ICD-10-CM | POA: Diagnosis not present

## 2017-11-03 DIAGNOSIS — Z9049 Acquired absence of other specified parts of digestive tract: Secondary | ICD-10-CM | POA: Diagnosis not present

## 2017-11-03 DIAGNOSIS — D509 Iron deficiency anemia, unspecified: Secondary | ICD-10-CM | POA: Diagnosis present

## 2017-11-03 DIAGNOSIS — Z9010 Acquired absence of unspecified breast and nipple: Secondary | ICD-10-CM | POA: Diagnosis not present

## 2017-11-03 DIAGNOSIS — E119 Type 2 diabetes mellitus without complications: Secondary | ICD-10-CM | POA: Diagnosis not present

## 2017-11-03 DIAGNOSIS — I10 Essential (primary) hypertension: Secondary | ICD-10-CM | POA: Diagnosis not present

## 2017-11-03 DIAGNOSIS — I471 Supraventricular tachycardia: Secondary | ICD-10-CM | POA: Diagnosis not present

## 2017-11-03 DIAGNOSIS — F0390 Unspecified dementia without behavioral disturbance: Secondary | ICD-10-CM | POA: Diagnosis present

## 2017-11-03 LAB — GLUCOSE, CAPILLARY
GLUCOSE-CAPILLARY: 148 mg/dL — AB (ref 70–99)
Glucose-Capillary: 82 mg/dL (ref 70–99)
Glucose-Capillary: 140 mg/dL — ABNORMAL HIGH (ref 70–99)
Glucose-Capillary: 153 mg/dL — ABNORMAL HIGH (ref 70–99)

## 2017-11-03 LAB — BASIC METABOLIC PANEL
ANION GAP: 7 (ref 5–15)
BUN: 11 mg/dL (ref 8–23)
CHLORIDE: 101 mmol/L (ref 98–111)
CO2: 28 mmol/L (ref 22–32)
Calcium: 8.9 mg/dL (ref 8.9–10.3)
Creatinine, Ser: 0.76 mg/dL (ref 0.44–1.00)
GFR calc Af Amer: >60 mL/min (ref >60)
Glucose, Bld: 92 mg/dL (ref 70–99)
POTASSIUM: 3.8 mmol/L (ref 3.5–5.1)
Sodium: 136 mmol/L (ref 135–145)

## 2017-11-03 LAB — CBC
HEMATOCRIT: 32.2 % — AB (ref 36.0–46.0)
HEMOGLOBIN: 10.1 g/dL — AB (ref 12.0–15.0)
MCH: 27.4 pg (ref 26.0–34.0)
MCHC: 31.4 g/dL (ref 30.0–36.0)
MCV: 87.3 fL (ref 78.0–100.0)
Platelets: 236 10*3/uL (ref 150–400)
RBC: 3.69 MIL/uL — ABNORMAL LOW (ref 3.87–5.11)
RDW: 15.6 % — AB (ref 11.5–15.5)
WBC: 7.3 10*3/uL (ref 4.0–10.5)

## 2017-11-03 LAB — URINE CULTURE: Culture: <10000 — AB

## 2017-11-03 MED ORDER — ENOXAPARIN SODIUM 40 MG/0.4ML ~~LOC~~ SOLN
40.0000 mg | SUBCUTANEOUS | Status: DC
  Administered 2017-11-03 – 2017-11-04 (×2): 40 mg via SUBCUTANEOUS
  Filled 2017-11-03 (×2): qty 0.4

## 2017-11-03 MED ORDER — FAMOTIDINE 20 MG PO TABS: 20.0000 mg | ORAL_TABLET | Freq: Two times a day (BID) | ORAL | Status: DC

## 2017-11-03 MED ORDER — SUCRALFATE 1 G PO TABS
1.0000 g | ORAL_TABLET | Freq: Two times a day (BID) | ORAL | Status: DC
  Administered 2017-11-03 – 2017-11-04 (×4): 1 g via ORAL
  Filled 2017-11-03 (×4): qty 1

## 2017-11-03 MED ORDER — VITAMIN D 1000 UNITS PO TABS
1000.0000 [IU] | ORAL_TABLET | Freq: Every day | ORAL | Status: DC
  Administered 2017-11-03 – 2017-11-04 (×2): 1000 [IU] via ORAL
  Filled 2017-11-03 (×2): qty 1

## 2017-11-03 MED ORDER — POLYSACCHARIDE IRON COMPLEX 150 MG PO CAPS
150.0000 mg | ORAL_CAPSULE | Freq: Two times a day (BID) | ORAL | Status: DC
  Administered 2017-11-03 – 2017-11-04 (×4): 150 mg via ORAL
  Filled 2017-11-03 (×4): qty 1

## 2017-11-03 MED ORDER — SALINE SPRAY 0.65 % NA SOLN
1.0000 | NASAL | Status: DC | PRN
Start: 2017-11-03 — End: 2017-11-05
  Filled 2017-11-03: qty 44

## 2017-11-03 MED ORDER — MAGNESIUM OXIDE 400 (241.3 MG) MG PO TABS
200.0000 mg | ORAL_TABLET | Freq: Every day | ORAL | Status: DC
Start: 2017-11-03 — End: 2017-11-05
  Administered 2017-11-03 – 2017-11-04 (×2): 200 mg via ORAL
  Filled 2017-11-03 (×2): qty 1

## 2017-11-03 MED ORDER — ZOLPIDEM TARTRATE 5 MG PO TABS: 5.0000 mg | ORAL_TABLET | Freq: Every evening | ORAL | Status: DC | PRN

## 2017-11-03 MED ORDER — L-METHYLFOLATE-B6-B12 3-35-2 MG PO TABS
1.0000 | ORAL_TABLET | Freq: Two times a day (BID) | ORAL | Status: DC
  Administered 2017-11-03 – 2017-11-04 (×4): 1 via ORAL
  Filled 2017-11-03 (×4): qty 1

## 2017-11-03 MED ORDER — ALBUTEROL SULFATE (2.5 MG/3ML) 0.083% IN NEBU: 2.5000 mg | INHALATION_SOLUTION | RESPIRATORY_TRACT | Status: DC | PRN

## 2017-11-03 MED ORDER — GUAIFENESIN ER 600 MG PO TB12
600.0000 mg | ORAL_TABLET | Freq: Two times a day (BID) | ORAL | Status: DC | PRN
  Administered 2017-11-03: 600 mg via ORAL
  Filled 2017-11-03: qty 1

## 2017-11-03 MED ORDER — ACETAMINOPHEN 325 MG PO TABS
650.0000 mg | ORAL_TABLET | Freq: Once | ORAL | Status: AC
Start: 2017-11-03 — End: 2017-11-03
  Administered 2017-11-03: 650 mg via ORAL
  Filled 2017-11-03: qty 2

## 2017-11-03 MED ORDER — NITROGLYCERIN 0.4 MG SL SUBL: 0.4000 mg | SUBLINGUAL_TABLET | SUBLINGUAL | Status: DC | PRN

## 2017-11-03 MED ORDER — MEMANTINE HCL 10 MG PO TABS
10.0000 mg | ORAL_TABLET | Freq: Two times a day (BID) | ORAL | Status: DC
  Administered 2017-11-03 – 2017-11-04 (×4): 10 mg via ORAL
  Filled 2017-11-03 (×4): qty 1

## 2017-11-03 MED ORDER — INSULIN ASPART 100 UNIT/ML ~~LOC~~ SOLN: 0.0000 [IU] | Freq: Every day | SUBCUTANEOUS | Status: DC

## 2017-11-03 MED ORDER — ONDANSETRON HCL 4 MG/2ML IJ SOLN: 4.0000 mg | Freq: Four times a day (QID) | INTRAMUSCULAR | Status: DC | PRN

## 2017-11-03 MED ORDER — CIPROFLOXACIN HCL 500 MG PO TABS
500.0000 mg | ORAL_TABLET | Freq: Once | ORAL | Status: AC
  Administered 2017-11-03: 500 mg via ORAL
  Filled 2017-11-03: qty 1

## 2017-11-03 MED ORDER — FLUTICASONE PROPIONATE 50 MCG/ACT NA SUSP
2.0000 | Freq: Every day | NASAL | Status: DC
  Administered 2017-11-03 – 2017-11-04 (×2): 2 via NASAL
  Filled 2017-11-03: qty 16

## 2017-11-03 MED ORDER — LIDOCAINE 5 % EX PTCH: 1.0000 | MEDICATED_PATCH | Freq: Every day | CUTANEOUS | Status: DC | PRN

## 2017-11-03 MED ORDER — ONDANSETRON HCL 4 MG PO TABS: 4.0000 mg | ORAL_TABLET | Freq: Four times a day (QID) | ORAL | Status: DC | PRN

## 2017-11-03 MED ORDER — ATORVASTATIN CALCIUM 20 MG PO TABS
10.0000 mg | ORAL_TABLET | Freq: Every day | ORAL | Status: DC
  Administered 2017-11-03 – 2017-11-04 (×2): 10 mg via ORAL
  Filled 2017-11-03 (×2): qty 1

## 2017-11-03 MED ORDER — IPRATROPIUM-ALBUTEROL 0.5-2.5 (3) MG/3ML IN SOLN
3.0000 mL | Freq: Four times a day (QID) | RESPIRATORY_TRACT | Status: DC
  Administered 2017-11-03 – 2017-11-04 (×6): 3 mL via RESPIRATORY_TRACT
  Filled 2017-11-03 (×7): qty 3

## 2017-11-03 MED ORDER — MOMETASONE FURO-FORMOTEROL FUM 100-5 MCG/ACT IN AERO
2.0000 | INHALATION_SPRAY | Freq: Two times a day (BID) | RESPIRATORY_TRACT | Status: DC
  Administered 2017-11-03 – 2017-11-04 (×4): 2 via RESPIRATORY_TRACT
  Filled 2017-11-03: qty 8.8

## 2017-11-03 MED ORDER — DILTIAZEM HCL ER COATED BEADS 180 MG PO CP24
180.0000 mg | ORAL_CAPSULE | Freq: Every day | ORAL | Status: DC
  Administered 2017-11-03 – 2017-11-04 (×2): 180 mg via ORAL
  Filled 2017-11-03 (×2): qty 1

## 2017-11-03 MED ORDER — OMEGA-3-ACID ETHYL ESTERS 1 G PO CAPS
1.0000 g | ORAL_CAPSULE | Freq: Every day | ORAL | Status: DC
  Administered 2017-11-03 – 2017-11-04 (×2): 1 g via ORAL
  Filled 2017-11-03 (×3): qty 1

## 2017-11-03 MED ORDER — INSULIN ASPART 100 UNIT/ML ~~LOC~~ SOLN
0.0000 [IU] | Freq: Three times a day (TID) | SUBCUTANEOUS | Status: DC
  Administered 2017-11-04: 2 [IU] via SUBCUTANEOUS
  Administered 2017-11-04: 3 [IU] via SUBCUTANEOUS

## 2017-11-03 MED ORDER — CIPROFLOXACIN HCL 500 MG PO TABS: 500.0000 mg | ORAL_TABLET | Freq: Two times a day (BID) | ORAL | 0 refills | Status: DC

## 2017-11-03 MED ORDER — FAMOTIDINE 20 MG PO TABS
20.0000 mg | ORAL_TABLET | Freq: Every day | ORAL | Status: DC
  Administered 2017-11-03 – 2017-11-04 (×2): 20 mg via ORAL
  Filled 2017-11-03 (×2): qty 1

## 2017-11-03 MED ORDER — FUROSEMIDE 20 MG PO TABS: 40.0000 mg | ORAL_TABLET | Freq: Every day | ORAL | Status: DC | PRN

## 2017-11-03 MED ORDER — HYDRALAZINE HCL 20 MG/ML IJ SOLN: 5.0000 mg | INTRAMUSCULAR | Status: DC | PRN

## 2017-11-03 MED ORDER — OXYCODONE-ACETAMINOPHEN 5-325 MG PO TABS: 1.0000 | ORAL_TABLET | ORAL | Status: DC | PRN

## 2017-11-03 MED ORDER — CALCIUM GLUCONATE 500 MG PO TABS
1.0000 | ORAL_TABLET | Freq: Every day | ORAL | Status: DC
  Filled 2017-11-03 (×3): qty 1

## 2017-11-03 MED ORDER — POLYETHYLENE GLYCOL 3350 17 G PO PACK
17.0000 g | PACK | Freq: Every day | ORAL | Status: DC | PRN
Start: 2017-11-03 — End: 2017-11-05

## 2017-11-03 MED ORDER — LISINOPRIL 10 MG PO TABS
10.0000 mg | ORAL_TABLET | Freq: Every day | ORAL | Status: DC
  Administered 2017-11-03 – 2017-11-04 (×2): 10 mg via ORAL
  Filled 2017-11-03 (×2): qty 1

## 2017-11-03 MED ORDER — GABAPENTIN 300 MG PO CAPS
300.0000 mg | ORAL_CAPSULE | Freq: Every day | ORAL | Status: DC
  Administered 2017-11-03 – 2017-11-04 (×2): 300 mg via ORAL
  Filled 2017-11-03 (×2): qty 1

## 2017-11-03 MED ORDER — ACETAMINOPHEN 325 MG PO TABS
650.0000 mg | ORAL_TABLET | Freq: Four times a day (QID) | ORAL | Status: DC | PRN
Start: 2017-11-03 — End: 2017-11-05
  Administered 2017-11-03 – 2017-11-04 (×3): 650 mg via ORAL
  Filled 2017-11-03 (×3): qty 2

## 2017-11-03 NOTE — Evaluation (Signed)
Physical Therapy Evaluation Patient Details Name: Candice Miller MRN: [id number] DOB: [birth year] Today's Date: 11/03/2017   History of Present Illness  84 y.o. female with medical history significant of hypertension, hyperlipidemia, diabetes mellitus, COPD on 2 L nasal cannula oxygen at home, GERD, breast cancer, iron deficiency anemia, NPH, dementia, SVT, who presents with generalized weakness, confusion and back pain.  Clinical Impression  Orders received for PT evaluation. Patient demonstrates deficits in functional mobility as indicated below. Will benefit from continued skilled PT to address deficits and maximize function. Will see as indicated and progress as tolerated.      Follow Up Recommendations SNF;Supervision/Assistance - 24 hour(vs HHPT if pt can progress or family can increase assist)    Equipment Recommendations  (tbd)    Recommendations for Other Services       Precautions / Restrictions Precautions Precautions: Fall      Mobility  Bed Mobility Overal bed mobility: Needs Assistance Bed Mobility: Rolling;Supine to Sit Rolling: Min assist   Supine to sit: Mod assist     General bed mobility comments: moderate assist to elevate trunk to upright and rotate hips to EOB  Transfers Overall transfer level: Needs assistance Equipment used: 1 person hand held assist(face to face with bilateral UE support) Transfers: Sit to/from [facility] Sit to Stand: Mod assist Stand pivot transfers: Max assist       General transfer comment: Increased physical assist to power to upright. Increased time and effort to perform. high FOF noted. Patient did required manual facilitation of weight shift for transitional movement to chair. VCs for hand placement and positioning prior to descent to chair. assist to control  Ambulation/Gait             General Gait Details: unable to perform at this time due to fatigue  Stairs            Wheelchair  Mobility    Modified Rankin (Stroke Patients Only)       Balance Overall balance assessment: Needs assistance;History of Falls   Sitting balance-Leahy Scale: Fair Sitting balance - Comments: able to sit EOB      Standing balance-Leahy Scale: Poor Standing balance comment: reliance on bilateral UE support                             Pertinent Vitals/Pain Pain Assessment: Faces Faces Pain Scale: Hurts even more Pain Location: back Pain Descriptors / Indicators: Aching Pain Intervention(s): Monitored during session;Repositioned    Home Living Family/patient expects to be discharged to:: Skilled nursing facility(Per doctors note) Living Arrangements: Children                    Prior Function Level of Independence: Needs assistance   Gait / Transfers Assistance Needed: Ambulates with rollator vs RW household distances  ADL's / Homemaking Assistance Needed: daughter and aide assist with adl task performance daily  Comments: typically uses rollator for mobility. Has home aide every morning for 4 hours     Hand Dominance   Dominant Hand: Right    Extremity/Trunk Assessment   Upper Extremity Assessment Upper Extremity Assessment: Generalized weakness    Lower Extremity Assessment Lower Extremity Assessment: Generalized weakness       Communication   Communication: HOH  Cognition Arousal/Alertness: Awake/alert Behavior During Therapy: WFL for tasks assessed/performed Overall Cognitive Status: No family/caregiver present to determine baseline cognitive functioning  General Comments      Exercises     Assessment/Plan    PT Assessment Patient needs continued PT services  PT Problem List Decreased strength;Decreased activity tolerance;Decreased balance;Decreased mobility;Decreased cognition;Decreased safety awareness;Pain       PT Treatment Interventions DME instruction;Gait  training;Functional mobility training;Therapeutic activities;Therapeutic exercise;Balance training;Neuromuscular re-education;Patient/family education    PT Goals (Current goals can be found in the Care Plan section)  Acute Rehab PT Goals Patient Stated Goal: to feel better PT Goal Formulation: With patient Time For Goal Achievement: 11/17/17 Potential to Achieve Goals: Good    Frequency Min 2X/week   Barriers to discharge        Co-evaluation               AM-PAC PT "6 Clicks" Daily Activity  Outcome Measure Difficulty turning over in bed (including adjusting bedclothes, sheets and blankets)?: Unable Difficulty moving from lying on back to sitting on the side of the bed? : Unable Difficulty sitting down on and standing up from a chair with arms (e.g., wheelchair, bedside commode, etc,.)?: Unable Help needed moving to and from a bed to chair (including a wheelchair)?: A Lot Help needed walking in hospital room?: Total Help needed climbing 3-5 steps with a railing? : Total 6 Click Score: 7    End of Session Equipment Utilized During Treatment: Gait belt;Oxygen(2 liters) Activity Tolerance: Patient limited by fatigue Patient left: in chair;with call bell/phone within reach;with chair alarm set Nurse Communication: Mobility status PT Visit Diagnosis: Unsteadiness on feet (R26.81);Muscle weakness (generalized) (M62.81);History of falling (Z91.81)    Time: 8569-4370 PT Time Calculation (min) (ACUTE ONLY): 17 min   Charges:   PT Evaluation $PT Eval Moderate Complexity: 1 Mod     PT G Codes:        Alben Deeds, PT DPT  Board Certified Neurologic Specialist [place] 11/03/2017, 3:25 PM

## 2017-11-03 NOTE — ED Notes (Signed)
Pt attempted to ambulate hall but was only able to walk to the door, this with waker and 2 staff assist. Pt with complaints of dizziness and very weak. Family stated thas pt was not at normal

## 2017-11-03 NOTE — ED Provider Notes (Signed)
Care assumed from previous provider . Please see their note for further details to include full history and physical. To summarize in short pt is a 84 yo female resents to the ED with worsening altered mental status, weakness. Case discussved, plan agreed upon.   Time of care handoff was awaiting CT scan of abdomen, L-spine and head.  No acute infections in the abdomen.  Patient has no acute ab normalities of the head.  Does have an acute compression fracture of L2.  I tried to ambulate patient however she was severely weakened and pain.  Patient family states that this is new for her.  She does require some assist at home however seems to be declining over the past several months.  Will admit to the hospital medicine for further work-up and possible long-term care placement.       Doristine Devoid, PA-C 11/03/17 6629    Veryl Speak, MD 11/03/17 [phone]

## 2017-11-03 NOTE — H&P (Signed)
History and Physical    Candice Miller KKX:[id number] DOB: 1925/01/31 DOA: 11/02/2017  Referring MD/NP/PA:   PCP: Lawerance Cruel, MD   Patient coming from:  The patient is coming from home.  At baseline, pt is dependent for most of ADL.   Chief Complaint: Generalized weakness, confusion, back pain,  HPI: Candice Miller is a 84 y.o. female with medical history significant of hypertension, hyperlipidemia, diabetes mellitus, COPD on 2 L nasal cannula oxygen at home, GERD, breast cancer, iron deficiency anemia, NPH, dementia, SVT, who presents with generalized weakness, confusion and back pain.  Per patient's daughter, patient has generalized weakness, and increased confusion and worsening lower back pain in the past several days.  Her confusion has been intermittent, but improved today.  Currently patient is oriented x3.  She has chronic back pain, which has worsened recently. The back pain is severe, constant, radiating to the legs posteriorly and bilaterally.  Patient normally can use walker for walking around at home, but she cannot walk anymore in the past several days.  No urinary incontinence or loss control for bladder.  No facial droop, slurred speech, vision change or hearing loss.  Patient does not have chest pain, shortness of breath, fever or chills.  She has nausea, but no vomiting.  She had one loose stool bowel movement today.  She reports burning and dysuria.  She has suprapubic tenderness.  ED Course: pt was found to have WBC 6.9, negative urinalysis, electrolytes renal function okay, temperature normal, no tachycardia, oxygen saturation 100% on 2 L nasal cannula oxygen, CT head is negative for acute intracranial abnormalities. Chest x-ray negative. CT abdomen/pelvis is negative for acute intra-abdominal issues, but showed acute appearing anterior compression of L2 vertebra with about 60% loss of height.  Patient is admitted to telemetry bed as inpatient.  Review of Systems:    General: no fevers, chills, no body weight gain, has poor appetite, has fatigue HEENT: no blurry vision, hearing changes or sore throat Respiratory: no dyspnea, coughing, wheezing CV: no chest pain, no palpitations GI: has nausea and suprapubic tenderness and one loose stool BM. No no voomiting, constipation GU: no dysuria, burning on urination, increased urinary frequency, hematuria  Ext: no leg edema Neuro: no unilateral weakness, numbness, or tingling, no vision change or hearing loss. Has confusion. Skin: no rash, no skin tear. MSK: No muscle spasm, no deformity, no limitation of range of movement in spin Heme: No easy bruising.  Travel history: No recent long distant travel.  Allergy:  Allergies  Allergen Reactions  . Adhesive [Tape] Other (See Comments)    SKIN IS FRAGILE AND WILL TEAR VERY EASILY!!!  . Macrodantin [Nitrofurantoin] Other (See Comments)    "Made me hurt all over"  . Sulfa Antibiotics Swelling    Swelling of the throat, but patient denies that it impaired her breathing  . Keflex [Cephalexin] Rash    Past Medical History:  Diagnosis Date  . Anemia   . Breast cancer ([place]) 1994  . COPD (chronic obstructive pulmonary disease) ([place])   . Diabetes mellitus without complication ([place])    diet controlled vs. on glimepride  . Hypertension   . NPH (normal pressure hydrocephalus)   . Osteoporosis     Past Surgical History:  Procedure Laterality Date  . CHOLECYSTECTOMY    . COLONOSCOPY WITH PROPOFOL N/A 01/01/2017   Procedure: COLONOSCOPY WITH PROPOFOL;  Surgeon: Laurence Spates, MD;  Location: [facility];  Service: Endoscopy;  Laterality: N/A;  . MASTECTOMY  1994  .  TUBAL LIGATION      Social History:  reports that she has never smoked. She has quit using smokeless tobacco. Her smokeless tobacco use included snuff and chew. She reports that she does not drink alcohol or use drugs.  Family History:  Family History  Problem Relation Age of Onset  .  Hypertension Mother   . Diabetes Mother   . Heart failure Mother   . Stroke Mother   . Hypertension Father   . Diabetes Father   . Heart failure Father   . Stroke Father      Prior to Admission medications   Medication Sig Start Date End Date Taking? Authorizing Provider  acetaminophen (TYLENOL) 500 MG tablet Take 1,000 mg by mouth every 6 (six) hours.    Yes [provider]  albuterol (PROVENTIL HFA;VENTOLIN HFA) 108 (90 Base) MCG/ACT inhaler Inhale 2 puffs into the lungs every 6 (six) hours as needed for wheezing or shortness of breath.    Yes [provider]  atorvastatin (LIPITOR) 10 MG tablet Take 10 mg by mouth daily.   Yes [provider]  budesonide-formoterol (SYMBICORT) 80-4.5 MCG/ACT inhaler Inhale 2 puffs into the lungs 2 (two) times daily.   Yes [provider]  calcium gluconate 500 MG tablet Take 1 tablet by mouth daily.   Yes [provider]  cholecalciferol (VITAMIN D) 1000 units tablet Take 1,000 Units by mouth daily.   Yes [provider]  diltiazem (CARDIZEM CD) 180 MG 24 hr capsule Take 180 mg by mouth daily.   Yes [provider]  fexofenadine (ALLEGRA) 180 MG tablet Take 180 mg by mouth daily.   Yes [provider]  fluticasone (FLONASE) 50 MCG/ACT nasal spray Place 2 sprays into both nostrils daily.   Yes [provider]  furosemide (LASIX) 40 MG tablet Take 40 mg by mouth daily as needed for fluid or edema.    Yes [provider]  gabapentin (NEURONTIN) 300 MG capsule Take 300 mg by mouth at bedtime.    Yes [provider]  glimepiride (AMARYL) 1 MG tablet Take 1 mg by mouth See admin instructions. Take 1 mg by mouth once a day only if BGL is >150, per provider   Yes [provider]  ipratropium-albuterol (DUONEB) 0.5-2.5 (3) MG/3ML SOLN Take 3 mLs by nebulization 4 (four) times daily.    Yes [provider]  iron polysaccharides (NIFEREX) 150 MG  capsule Take 150 mg by mouth 2 (two) times daily.    Yes [provider]  l-methylfolate-B6-B12 (METANX) 3-35-2 MG TABS tablet Take 1 tablet by mouth 2 (two) times daily.    Yes [provider]  Lidocaine (ASPERCREME LIDOCAINE) 4 % PTCH Apply 1 patch topically daily as needed (for back pain).    Yes [provider]  lisinopril (PRINIVIL,ZESTRIL) 10 MG tablet Take 10 mg by mouth daily.   Yes [provider]  Magnesium 250 MG TABS Take 250 mg by mouth at bedtime.   Yes [provider]  memantine (NAMENDA) 10 MG tablet Take 10 mg by mouth 2 (two) times daily.   Yes [provider]  Omega-3 Fatty Acids (FISH OIL) 1000 MG CAPS Take 1,000 mg by mouth daily.   Yes [provider]  OXYGEN Inhale 2 L into the lungs continuous.   Yes [provider]  polyethylene glycol (MIRALAX / GLYCOLAX) packet Take 17 g by mouth daily. Patient taking differently: Take 17 g by mouth daily as needed for  mild constipation.  01/01/17  Yes Florencia Reasons, MD  ranitidine (ZANTAC) 150 MG tablet Take 150 mg by mouth 2 (two) times daily.    Yes [provider]  sodium chloride (OCEAN) 0.65 % SOLN nasal spray Place 1 spray into both nostrils as needed for congestion. 08/17/17  Yes Debbe Odea, MD  sucralfate (CARAFATE) 1 g tablet Take 1 g by mouth 2 (two) times daily.   Yes [provider]  traZODone (DESYREL) 50 MG tablet Take 50 mg by mouth at bedtime. 10/28/17  Yes [provider]  acetaminophen (TYLENOL 8 HOUR) 650 MG CR tablet Take 1 tablet (650 mg total) by mouth every 8 (eight) hours as needed. Patient not taking: Reported on 11/02/2017 08/10/17   Varney Biles, MD  ciprofloxacin (CIPRO) 500 MG tablet Take 1 tablet (500 mg total) by mouth every 12 (twelve) hours for 5 days. 11/03/17 11/08/17  Prescilla Sours, MD  guaiFENesin (MUCINEX) 600 MG 12 hr tablet Take 1 tablet (600 mg total) by mouth 2 (two) times daily. Patient not taking:  Reported on 11/02/2017 08/17/17   Debbe Odea, MD  lidocaine (LIDODERM) 5 % Place 1 patch onto the skin daily. Remove & Discard patch within 12 hours or as directed by MD Patient not taking: Reported on 11/02/2017 08/17/17   Debbe Odea, MD  nitroGLYCERIN (NITROSTAT) 0.4 MG SL tablet Place 0.4 mg under the tongue every 5 (five) minutes as needed for chest pain.    [provider]  ondansetron (ZOFRAN) 4 MG tablet Take 1 tablet (4 mg total) by mouth every 6 (six) hours as needed for nausea. Patient not taking: Reported on 11/02/2017 08/17/17   Debbe Odea, MD    Physical Exam: Vitals:   11/03/17 0000 11/03/17 0030 11/03/17 0130 11/03/17 0145  BP: (!) 142/64 130/63 (!) 159/66 (!) 149/62  Pulse: 76 78 84 80  Resp: 14 15 18 20   Temp:      TempSrc:      SpO2: 100% 99% 100% 100%  Weight:      Height:       General: Not in acute distress HEENT:       Eyes: PERRL, EOMI, no scleral icterus.       ENT: No discharge from the ears and nose, no pharynx injection, no tonsillar enlargement.        Neck: No JVD, no bruit, no mass felt. Heme: No neck lymph node enlargement. Cardiac: S1/S2, RRR, No murmurs, No gallops or rubs. Respiratory:  No rales, wheezing, rhonchi or rubs. GI: Soft, nondistended, has mild tenderness in suprapubic area, no rebound pain, no organomegaly, BS present. GU: No hematuria Ext: No pitting leg edema bilaterally. 2+DP/PT pulse bilaterally. Musculoskeletal: has lower back tenderness. Skin: No rashes.  Neuro: mildly confused, but oriented X3, cranial nerves II-XII grossly intact, moves all extremities normally.  Psych: Patient is not psychotic, no suicidal or hemocidal ideation.  Labs on Admission: I have personally reviewed following labs and imaging studies  CBC: Recent Labs  Lab 11/02/17 1900  WBC 6.9  NEUTROABS 4.0  HGB 10.5*  HCT 34.0*  MCV 87.0  PLT 202   Basic Metabolic Panel: Recent Labs  Lab 11/02/17 1900  NA 135  K 4.7  CL 98  CO2 28    GLUCOSE 79  BUN 16  CREATININE 0.81  CALCIUM 9.2   GFR: Estimated Creatinine Clearance: 43.9 mL/min (by C-G formula based on SCr of 0.81 mg/dL). Liver Function Tests: Recent Labs  Lab 11/02/17 1900  AST 41  ALT 15  ALKPHOS 85  BILITOT 0.8  PROT 6.5  ALBUMIN 3.7   No results for input(s): LIPASE, AMYLASE in the last 168 hours. No results for input(s): AMMONIA in the last 168 hours. Coagulation Profile: No results for input(s): INR, PROTIME in the last 168 hours. Cardiac Enzymes: No results for input(s): CKTOTAL, CKMB, CKMBINDEX, TROPONINI in the last 168 hours. BNP (last 3 results) No results for input(s): PROBNP in the last 8760 hours. HbA1C: No results for input(s): HGBA1C in the last 72 hours. CBG: No results for input(s): GLUCAP in the last 168 hours. Lipid Profile: No results for input(s): CHOL, HDL, LDLCALC, TRIG, CHOLHDL, LDLDIRECT in the last 72 hours. Thyroid Function Tests: No results for input(s): TSH, T4TOTAL, FREET4, T3FREE, THYROIDAB in the last 72 hours. Anemia Panel: No results for input(s): VITAMINB12, FOLATE, FERRITIN, TIBC, IRON, RETICCTPCT in the last 72 hours. Urine analysis:    Component Value Date/Time   COLORURINE STRAW (A) 11/02/2017 1952   APPEARANCEUR CLEAR 11/02/2017 1952   LABSPEC 1.006 11/02/2017 1952   PHURINE 8.0 11/02/2017 1952   GLUCOSEU NEGATIVE 11/02/2017 1952   HGBUR NEGATIVE 11/02/2017 [place] NEGATIVE 11/02/2017 1952   KETONESUR NEGATIVE 11/02/2017 1952   PROTEINUR NEGATIVE 11/02/2017 1952   NITRITE NEGATIVE 11/02/2017 1952   LEUKOCYTESUR NEGATIVE 11/02/2017 1952   Sepsis Labs: @LABRCNTIP (procalcitonin:4,lacticidven:4) )No results found for this or any previous visit (from the past 240 hour(s)).   Radiological Exams on Admission: Ct Abdomen Pelvis Wo Contrast  Result Date: 11/03/2017 CLINICAL DATA:  Bilateral leg pain. Burning with urination. Fall a couple of months ago. EXAM: CT ABDOMEN AND PELVIS WITHOUT  CONTRAST TECHNIQUE: Multidetector CT imaging of the abdomen and pelvis was performed following the standard protocol without IV contrast. COMPARISON:  CT pelvis 06/15/2017 FINDINGS: Lower chest: Emphysematous changes and scarring in the lung bases. Coronary artery calcifications. Small esophageal hiatal hernia. Hepatobiliary: Surgical absence of the gallbladder. No focal liver lesions. No bile duct dilatation. Pancreas: Unremarkable. No pancreatic ductal dilatation or surrounding inflammatory changes. Spleen: Normal in size without focal abnormality. Adrenals/Urinary Tract: Adrenal glands are unremarkable. Kidneys are normal, without renal calculi, focal lesion, or hydronephrosis. Bladder is unremarkable. Stomach/Bowel: Stomach is within normal limits. Appendix appears surgically absent. No evidence of bowel wall thickening, distention, or inflammatory changes. Vascular/Lymphatic: Aortic atherosclerosis. No enlarged abdominal or pelvic lymph nodes. Reproductive: Uterus and bilateral adnexa are unremarkable. Other: No abdominal wall hernia or abnormality. No abdominopelvic ascites. Musculoskeletal: Acute appearing anterior compression of L2 vertebra with about 60% loss of height. Chronic appearing compression fractures of T8, superior endplate of G40, N02, and L1. Multiple compression fractures likely indicate osteoporosis. IMPRESSION: 1. No acute process demonstrated in the abdomen or pelvis. No evidence of bowel obstruction or inflammation. 2. Acute appearing anterior compression of L2 vertebra with about 60% loss of height. 3. Small esophageal hiatal hernia. 4. Emphysematous changes and scarring in the lung bases. Aortic Atherosclerosis (ICD10-I70.0). Electronically Signed   By: Lucienne Capers M.D.   On: 11/03/2017 01:05   Dg Chest 2 View  Result Date: 11/02/2017 CLINICAL DATA:  Altered mental status with cough EXAM: CHEST - 2 VIEW COMPARISON:  08/12/2017, 06/17/2017 FINDINGS: Chronic interstitial opacity.  No acute airspace disease or effusion. Mild cardiomegaly. Aortic atherosclerosis. No pneumothorax. Stable multiple compression deformities of the thoracic and lumbar spine. IMPRESSION: No active cardiopulmonary disease. Similar appearance of chronic interstitial disease and mild cardiomegaly. Electronically Signed   By: Madie Reno.D.  On: 11/02/2017 19:46   Ct Head Wo Contrast  Result Date: 11/03/2017 CLINICAL DATA:  Increased weakness and altered mental status. Patient fell a couple of months ago. EXAM: CT HEAD WITHOUT CONTRAST TECHNIQUE: Contiguous axial images were obtained from the base of the skull through the vertex without intravenous contrast. COMPARISON:  08/10/2017 FINDINGS: Brain: Diffuse cerebral atrophy. Mild ventricular dilatation consistent with central atrophy. Low-attenuation changes throughout the deep white matter consistent with small vessel ischemic changes. Focal areas of encephalomalacia extending to the cortical surface in the frontal lobes bilaterally, greater on the right. These are consistent with old infarcts and similar to previous study. No mass-effect or midline shift. No abnormal extra-axial fluid collections. Gray-white matter junctions are distinct. Basal cisterns are not effaced. No acute intracranial hemorrhage. Vascular: Intracranial arterial vascular calcifications are present. Skull: Calvarium appears intact. Sinuses/Orbits: Mild mucosal thickening in the paranasal sinuses. No acute air-fluid levels. Mastoid air cells are clear. Other: None. IMPRESSION: No acute intracranial abnormalities. Chronic atrophy and small vessel ischemic changes. 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 Electronically Signed   By: Lucienne Capers M.D.   On: 11/03/2017 00:20   Ct L-spine No Charge  Result Date: 11/03/2017 CLINICAL DATA:  Initial evaluation for increase weakness, recent fall. EXAM: CT LUMBAR SPINE WITHOUT CONTRAST TECHNIQUE: Multidetector CT imaging of the lumbar  spine was performed without intravenous contrast administration. Multiplanar CT image reconstructions were also generated. COMPARISON:  Prior CT from 06/15/2017 FINDINGS: Segmentation: Normal segmentation. Lowest well-formed disc labeled the L5-S1 level. Alignment: Mild levoscoliosis with exaggeration of the normal lumbar lordosis. Trace 2 mm retrolisthesis of L2 on L3. Vertebrae: Acute to subacute appearing compression fracture involving the inferior endplate of L2, new relative to prior CT from 06/15/2017. Associated height loss of up to approximately 50% with 3 mm bony retropulsion. This appears benign/mechanical in nature. Chronic compression deformities involving the T12 and L1 levels are stable from previous. Vertebral body heights otherwise maintained. No other acute or interval fracture. Visualized sacrum and pelvis intact. SI joints approximated symmetric. Benign bone island noted within the right sacral ala. No other discrete or worrisome osseous lesions. Paraspinal and other soft tissues: Paraspinous soft tissues demonstrate no acute finding. Extensive aorto bi-iliac atherosclerotic disease. Cholecystectomy clips noted. Disc levels: T12-L1: 4 mm bony retropulsion related to the chronic L1 compression fracture. Flattening of the ventral thecal sac without significant spinal stenosis. Right greater than left facet hypertrophy. Foramina remain patent. L1-2: Diffuse disc bulge with disc desiccation and intervertebral disc space narrowing. Mild facet hypertrophy. No significant stenosis. L2-3: 3 mm bony retropulsion related to the L2 compression fracture mildly flattens and indents the ventral thecal sac. Associated mild disc bulge with disc desiccation. No significant stenosis. Mild bilateral L2 foraminal narrowing. L3-4: Mild diffuse disc bulge. Mild facet and ligament flavum hypertrophy. No significant spinal stenosis. Mild to moderate bilateral L3 foraminal narrowing. L4-5: Mild diffuse disc bulge. Mild  facet and ligament flavum hypertrophy. No significant spinal stenosis. Mild to moderate bilateral L4 foraminal narrowing. L5-S1: Minimal disc bulge with facet hypertrophy. No significant stenosis. IMPRESSION: 1. Acute to subacute appearing compression fracture involving the inferior endplate of L2 with up to 50% height loss and 3 mm bony retropulsion. No significant stenosis. This is benign/mechanical in appearance. 2. Additional chronic compression deformities involving the T12 and L1 vertebral bodies, stable. 3. Multilevel degenerative spondylolysis and facet hypertrophy as above without significant spinal stenosis. Mild-to-moderate bilateral foraminal narrowing at L2 through L4. Electronically Signed   By: Marland Kitchen  Jeannine Boga M.D.   On: 11/03/2017 01:36     EKG: Independently reviewed.  Sinus rhythm, QTC 439, no ischemic change.  Assessment/Plan Principal Problem:   Acute metabolic encephalopathy Active Problems:   COPD (chronic obstructive pulmonary disease) ([facility])   Diabetes mellitus without complication ([facility])   HTN (hypertension)   Dementia without behavioral disturbance   GERD (gastroesophageal reflux disease)   Paroxysmal SVT (supraventricular tachycardia) (HCC)   Chronic back pain   Hyperlipidemia   Compression fracture of L2 lumbar vertebra ([facility])   Acute metabolic encephalopathy: Etiology is not clear, CT head is negative for acute intracranial abnormalities.  No focal neurologic findings on physical examination.  Patient does not have fever or leukocytosis, does not seem to have sepsis.  Urinalysis negative.  Possibly due to multifactorial etiology, including dementia, delirium, worsening back pain, and sedative medication use.  Per her daughter, mental status is improved today.  Currently patient is mildly confused, but oriented x3.  -will admit to telemetry bed as inpatient (patient needs placement, likely 2 days) -frequent neuro check  -hold Allegra and trazodone - consult SW  and CM for rehab placement  Chronic back pain and compression fracture of L2 lumbar vertebra: pt has severe back pain.  Daughter states that it is difficult to take care of patient.  Will need placement. -PT/OT -As needed Percocet -continue gabapentin -consult SW and CM for rehab placement  HTN:  -Continue home medications: Cardizem, Lasix, lisinopril -IV hydralazine prn  COPD (chronic obstructive pulmonary disease) ([place]): stable -Bronchodilators  Dementia without behavioral disturbance: -namenda  GERD: -Pepcid and carafate  Paroxysmal SVT (supraventricular tachycardia) ([place]): -tele monitoring -continue cardizem  Hyperlipidemia: -lipitor  Dysuria, burning on urination and suprapubic tenderness: Urinalysis negative for UTI.  Possibly due to urinary retention. -will get bladder scan.  If has urinary retention, will place Foley catheter -Follow-up urine culture   DVT ppx: SQ Lovenox Code Status: DNR Family Communication:   Yes, patient's daughter and son-in law   at bed side Disposition Plan:  Anticipate discharge back to previous home environment Consults called:  none Admission status:  Inpatient/tele      Date of Service 11/03/2017    Ivor Costa [facility] Pager [phone]  If 7PM-7AM, please contact night-coverage [email or web address] Password [facility] 11/03/2017, 3:45 AM

## 2017-11-03 NOTE — Progress Notes (Addendum)
[facility] TEAM 1 - Stepdown/ICU TEAM  Candice Miller  IWL:[id number] DOB: [birth year] DOA: 11/02/2017 PCP: Lawerance Cruel, MD    Brief Narrative:  84yo F w/ a hx of HTN, HLD, DM, COPD on 2 L nasal cannula oxygen at home, GERD, breast cancer, iron deficiency anemia, NPH, dementia, and SVT who presented with generalized weakness, increased confusion, and back pain.  In the ED she was found to have WBC 6.9, negative urinalysis, electrolytes & renal function okay, temperature normal, no tachycardia, oxygen saturation 100% on 2 L nasal cannula oxygen, CT head negative for acute intracranial abnormalities. Chest x-ray negative. CT abdomen/pelvis was negative for acute intra-abdominal issues, but showed acute appearing anterior compression of L2 vertebra with about 60% loss of height.    Subjective: Pt is seen for a f/u visit.    Assessment & Plan:  Acute metabolic encephalopathy in setting of dementia  CT head is negative for acute intracranial abnormalities - no focal neurologic findings on physical examination - likely due to dementia, back pain, and sedative medication use  Chronic back pain and compression fracture of L2 lumbar vertebra PT/OT - will need rehab palcement  HTN BP controlled   COPD Stable presently   GERD Cont Pepcid and carafate  Paroxysmal SVT  continue cardizem  Hyperlipidemia Cont lipitor  Dysuria, burning on urination and suprapubic tenderness Urinalysis negative for UTI - likely due to urinary retention - cont Foley catheter until more mobile   DVT prophylaxis: lovenox  Code Status: DNR - NO CODE Family Communication: no family present at time of exam  Disposition Plan: will need SNF rehab stay   Consultants:  none  Procedures: none  Antimicrobials:  none   Objective: Blood pressure 133/73, pulse 81, temperature 98 F (36.7 C), temperature source Oral, resp. rate 13, height 5' (1.524 m), weight 82.6 kg (182 lb), SpO2 100  %.  Intake/Output Summary (Last 24 hours) at 11/03/2017 1559 Last data filed at 11/03/2017 1740 Gross per 24 hour  Intake 500 ml  Output 2050 ml  Net -1550 ml   Filed Weights   11/02/17 1951  Weight: 82.6 kg (182 lb)    Examination: Pt was seen for a f/u visit.    CBC: Recent Labs  Lab 11/02/17 1900 11/03/17 0401  WBC 6.9 7.3  NEUTROABS 4.0  --   HGB 10.5* 10.1*  HCT 34.0* 32.2*  MCV 87.0 87.3  PLT 223 814   Basic Metabolic Panel: Recent Labs  Lab 11/02/17 1900 11/03/17 0401  NA 135 136  K 4.7 3.8  CL 98 101  CO2 28 28  GLUCOSE 79 92  BUN 16 11  CREATININE 0.81 0.76  CALCIUM 9.2 8.9   GFR: Estimated Creatinine Clearance: 44.5 mL/min (by C-G formula based on SCr of 0.76 mg/dL).  Liver Function Tests: Recent Labs  Lab 11/02/17 1900  AST 41  ALT 15  ALKPHOS 85  BILITOT 0.8  PROT 6.5  ALBUMIN 3.7    HbA1C: Hgb A1c MFr Bld  Date/Time Value Ref Range Status  01/01/2017 06:25 AM 6.3 (H) 4.8 - 5.6 % Final    Comment:    (NOTE) Pre diabetes:          5.7%-6.4% Diabetes:              >6.4% Glycemic control for   <7.0% adults with diabetes     CBG: Recent Labs  Lab 11/03/17 0818 11/03/17 1218  GLUCAP 82 140*    Recent Results (from the  past 240 hour(s))  Urine culture     Status: Abnormal   Collection Time: 11/02/17  7:54 PM  Result Value Ref Range Status   Specimen Description URINE, RANDOM  Final   Special Requests NONE  Final   Culture (A)  Final    <10,000 COLONIES/mL INSIGNIFICANT GROWTH Performed at [facility], [place] [address], [place], [place] [address]    Report Status 11/03/2017 FINAL  Final     Scheduled Meds: . atorvastatin  10 mg Oral Daily  . calcium gluconate  1 tablet Oral Daily  . cholecalciferol  1,000 Units Oral Daily  . diltiazem  180 mg Oral Daily  . enoxaparin (LOVENOX) injection  40 mg Subcutaneous Q24H  . famotidine  20 mg Oral Daily  . fluticasone  2 spray Each Nare Daily  . gabapentin  300 mg Oral  QHS  . insulin aspart  0-5 Units Subcutaneous QHS  . insulin aspart  0-9 Units Subcutaneous TID WC  . ipratropium-albuterol  3 mL Nebulization QID  . iron polysaccharides  150 mg Oral BID  . l-methylfolate-B6-B12  1 tablet Oral BID  . lisinopril  10 mg Oral Daily  . magnesium oxide  200 mg Oral QHS  . memantine  10 mg Oral BID  . mometasone-formoterol  2 puff Inhalation BID  . omega-3 acid ethyl esters  1 g Oral Daily  . sucralfate  1 g Oral BID    LOS: 0 days   Time spent: No Charge  Cherene Altes, MD [facility] Office  [phone] Pager - Text Page per Amion as per below:  On-Call/Text Page:      [email or web address]      password [email or web address]  If 7PM-7AM, please contact night-coverage [email or web address] Password [facility] 11/03/2017, 3:59 PM

## 2017-11-03 NOTE — ED Notes (Signed)
Pt ambulate in hall with walker and

## 2017-11-03 NOTE — NC FL2 (Signed)
[place] MEDICAID FL2 LEVEL OF CARE SCREENING TOOL     IDENTIFICATION  Patient Name: Candice Miller Birthdate: [birth year] Sex: female Admission Date (Current Location): 11/02/2017  [facility] and [place] Number:  Herbalist and Address:  [facility], [place] [address] [place],  [address]      Provider Number: [id number]  Attending Physician Name and Address:  Cherene Altes, MD  Relative Name and Phone Number:  [name], daughter, [phone]    Current Level of Care: Hospital Recommended Level of Care: [place] Prior Approval Number:    Date Approved/Denied:   PASRR Number: [id number]  Discharge Plan: SNF    Current Diagnoses: Patient Active Problem List   Diagnosis Date Noted  . Acute metabolic encephalopathy 51/88/4166  . Compression fracture of L2 lumbar vertebra ([place]) 11/03/2017  . Hyperlipidemia 09/20/2017  . Paroxysmal SVT (supraventricular tachycardia) ([place]) 08/17/2017  . Chronic back pain 08/17/2017  . Encounter for palliative care   . Goals of care, counseling/discussion   . Acute urinary retention 08/13/2017  . Frequent falls 08/13/2017  . Pressure injury of skin 08/13/2017  . CAP (community acquired pneumonia) 08/12/2017  . Acute respiratory failure with hypoxia ([place]) 06/20/2017  . Accident due to mechanical fall without injury 06/20/2017  . Dementia without behavioral disturbance 06/20/2017  . Confusion 06/20/2017  . Hyperlipidemia associated with type 2 diabetes mellitus ([place]) 06/20/2017  . GERD (gastroesophageal reflux disease) 06/20/2017  . Nodule of middle lobe of right lung 06/20/2017  . COPD with acute exacerbation ([place]) 06/15/2017  . Fall at home, initial encounter 06/15/2017  . COPD (chronic obstructive pulmonary disease) ([place]) 12/31/2016  . Diabetes mellitus without complication ([place]) 10/12/1599  . History of breast cancer 12/31/2016  . HTN (hypertension) 12/31/2016  . Hemorrhoids  12/31/2016  . Nodule of left lung 12/31/2016  . Lower GI bleed 12/30/2016  . Memory loss 10/22/2012    Orientation RESPIRATION BLADDER Height & Weight     Time, Self, Place  O2(Nasal cannula 2L) Continent, Indwelling catheter Weight: 82.6 kg (182 lb) Height:  5' (152.4 cm)  BEHAVIORAL SYMPTOMS/MOOD NEUROLOGICAL BOWEL NUTRITION STATUS      Continent Diet(Please see DC Summary)  AMBULATORY STATUS COMMUNICATION OF NEEDS Skin   Extensive Assist Verbally Normal                       Personal Care Assistance Level of Assistance  Bathing, Feeding, Dressing Bathing Assistance: Maximum assistance Feeding assistance: Limited assistance Dressing Assistance: Limited assistance     Functional Limitations Info  Sight, Hearing, Speech Sight Info: Adequate Hearing Info: Adequate Speech Info: Adequate    SPECIAL CARE FACTORS FREQUENCY  PT (By licensed PT), OT (By licensed OT)     PT Frequency: 5x/week OT Frequency: 3x/week            Contractures Contractures Info: Not present    Additional Factors Info  Code Status, Allergies, Insulin Sliding Scale Code Status Info: DNR Allergies Info: Adhesive Tape, Macrodantin Nitrofurantoin, Sulfa Antibiotics, Keflex Cephalexin   Insulin Sliding Scale Info: 3x daily with meals and at bedtime       Current Medications (11/03/2017):  This is the current hospital active medication list Current Facility-Administered Medications  Medication Dose Route Frequency Provider Last Rate Last Dose  . acetaminophen (TYLENOL) tablet 650 mg  650 mg Oral Q6H PRN Ivor Costa, MD   650 mg at 11/03/17 1128  . albuterol (PROVENTIL) (2.5 MG/3ML) 0.083% nebulizer solution  2.5 mg  2.5 mg Nebulization Q2H PRN Joette Catching T, MD      . atorvastatin (LIPITOR) tablet 10 mg  10 mg Oral Daily Ivor Costa, MD   10 mg at 11/03/17 1127  . calcium gluconate tablet 500 mg  1 tablet Oral Daily Ivor Costa, MD      . cholecalciferol (VITAMIN D) tablet 1,000 Units   1,000 Units Oral Daily Ivor Costa, MD   1,000 Units at 11/03/17 1127  . diltiazem (CARDIZEM CD) 24 hr capsule 180 mg  180 mg Oral Daily Ivor Costa, MD   180 mg at 11/03/17 1126  . enoxaparin (LOVENOX) injection 40 mg  40 mg Subcutaneous Q24H Ivor Costa, MD   40 mg at 11/03/17 1127  . famotidine (PEPCID) tablet 20 mg  20 mg Oral Daily Ivor Costa, MD   20 mg at 11/03/17 1127  . fluticasone (FLONASE) 50 MCG/ACT nasal spray 2 spray  2 spray Each Nare Daily Ivor Costa, MD   2 spray at 11/03/17 1119  . gabapentin (NEURONTIN) capsule 300 mg  300 mg Oral QHS Ivor Costa, MD      . guaiFENesin ([place]) 12 hr tablet 600 mg  600 mg Oral BID PRN Ivor Costa, MD   600 mg at 11/03/17 1126  . hydrALAZINE (APRESOLINE) injection 5 mg  5 mg Intravenous Q2H PRN Ivor Costa, MD      . insulin aspart (novoLOG) injection 0-5 Units  0-5 Units Subcutaneous QHS Ivor Costa, MD      . insulin aspart (novoLOG) injection 0-9 Units  0-9 Units Subcutaneous TID WC Ivor Costa, MD      . ipratropium-albuterol (DUONEB) 0.5-2.5 (3) MG/3ML nebulizer solution 3 mL  3 mL Nebulization QID Ivor Costa, MD   3 mL at 11/03/17 1118  . iron polysaccharides (NIFEREX) capsule 150 mg  150 mg Oral BID Ivor Costa, MD   150 mg at 11/03/17 1126  . l-methylfolate-B6-B12 (METANX) 3-35-2 MG per tablet 1 tablet  1 tablet Oral BID Ivor Costa, MD   1 tablet at 11/03/17 1127  . lidocaine (LIDODERM) 5 % 1 patch  1 patch Transdermal Daily PRN Ivor Costa, MD      . lisinopril (PRINIVIL,ZESTRIL) tablet 10 mg  10 mg Oral Daily Ivor Costa, MD   10 mg at 11/03/17 1126  . magnesium oxide (MAG-OX) tablet 200 mg  200 mg Oral QHS Ivor Costa, MD      . memantine [facility]) tablet 10 mg  10 mg Oral BID Ivor Costa, MD   10 mg at 11/03/17 1126  . mometasone-formoterol (DULERA) 100-5 MCG/ACT inhaler 2 puff  2 puff Inhalation BID Ivor Costa, MD   2 puff at 11/03/17 1144  . nitroGLYCERIN (NITROSTAT) SL tablet 0.4 mg  0.4 mg Sublingual Q5 min PRN Ivor Costa, MD      . omega-3  acid ethyl esters (LOVAZA) capsule 1 g  1 g Oral Daily Ivor Costa, MD   1 g at 11/03/17 1126  . ondansetron (ZOFRAN) tablet 4 mg  4 mg Oral Q6H PRN Ivor Costa, MD       Or  . ondansetron (ZOFRAN) injection 4 mg  4 mg Intravenous Q6H PRN Ivor Costa, MD      . oxyCODONE-acetaminophen (PERCOCET/ROXICET) 5-325 MG per tablet 1 tablet  1 tablet Oral Q4H PRN Ivor Costa, MD      . polyethylene glycol (MIRALAX / GLYCOLAX) packet 17 g  17 g Oral Daily PRN Ivor Costa, MD      .  sodium chloride (OCEAN) 0.65 % nasal spray 1 spray  1 spray Each Nare PRN Ivor Costa, MD      . sucralfate (CARAFATE) tablet 1 g  1 g Oral BID Ivor Costa, MD   1 g at 11/03/17 1126  . zolpidem (AMBIEN) tablet 5 mg  5 mg Oral QHS PRN Ivor Costa, MD         Discharge Medications: Please see discharge summary for a list of discharge medications.  Relevant Imaging Results:  Relevant Lab Results:   Additional Information ssn: [place], [place]

## 2017-11-04 DIAGNOSIS — E785 Hyperlipidemia, unspecified: Secondary | ICD-10-CM

## 2017-11-04 DIAGNOSIS — M5442 Lumbago with sciatica, left side: Secondary | ICD-10-CM

## 2017-11-04 DIAGNOSIS — K219 Gastro-esophageal reflux disease without esophagitis: Secondary | ICD-10-CM

## 2017-11-04 DIAGNOSIS — G9341 Metabolic encephalopathy: Principal | ICD-10-CM

## 2017-11-04 DIAGNOSIS — S32020A Wedge compression fracture of second lumbar vertebra, initial encounter for closed fracture: Secondary | ICD-10-CM

## 2017-11-04 DIAGNOSIS — F0280 Dementia in other diseases classified elsewhere without behavioral disturbance: Secondary | ICD-10-CM

## 2017-11-04 DIAGNOSIS — I10 Essential (primary) hypertension: Secondary | ICD-10-CM

## 2017-11-04 DIAGNOSIS — E119 Type 2 diabetes mellitus without complications: Secondary | ICD-10-CM

## 2017-11-04 DIAGNOSIS — I471 Supraventricular tachycardia: Secondary | ICD-10-CM

## 2017-11-04 DIAGNOSIS — G8929 Other chronic pain: Secondary | ICD-10-CM

## 2017-11-04 DIAGNOSIS — G309 Alzheimer's disease, unspecified: Secondary | ICD-10-CM

## 2017-11-04 DIAGNOSIS — M5441 Lumbago with sciatica, right side: Secondary | ICD-10-CM

## 2017-11-04 DIAGNOSIS — J449 Chronic obstructive pulmonary disease, unspecified: Secondary | ICD-10-CM

## 2017-11-04 LAB — BASIC METABOLIC PANEL
Anion gap: 9 (ref 5–15)
BUN: 18 mg/dL (ref 8–23)
CALCIUM: 8.9 mg/dL (ref 8.9–10.3)
CHLORIDE: 98 mmol/L (ref 98–111)
CO2: 28 mmol/L (ref 22–32)
Creatinine, Ser: 0.89 mg/dL (ref 0.44–1.00)
GFR, EST NON AFRICAN AMERICAN: 55 mL/min — AB (ref >60)
Glucose, Bld: 112 mg/dL — ABNORMAL HIGH (ref 70–99)
Potassium: 3.9 mmol/L (ref 3.5–5.1)
SODIUM: 135 mmol/L (ref 135–145)

## 2017-11-04 LAB — CBC
HCT: 30.6 % — ABNORMAL LOW (ref 36.0–46.0)
Hemoglobin: 9.5 g/dL — ABNORMAL LOW (ref 12.0–15.0)
MCH: 26.8 pg (ref 26.0–34.0)
MCHC: 31 g/dL (ref 30.0–36.0)
MCV: 86.4 fL (ref 78.0–100.0)
PLATELETS: 246 10*3/uL (ref 150–400)
RBC: 3.54 MIL/uL — ABNORMAL LOW (ref 3.87–5.11)
RDW: 15.7 % — AB (ref 11.5–15.5)
WBC: 7.3 10*3/uL (ref 4.0–10.5)

## 2017-11-04 LAB — GLUCOSE, CAPILLARY
GLUCOSE-CAPILLARY: 115 mg/dL — AB (ref 70–99)
GLUCOSE-CAPILLARY: 212 mg/dL — AB (ref 70–99)
Glucose-Capillary: 169 mg/dL — ABNORMAL HIGH (ref 70–99)
Glucose-Capillary: 139 mg/dL — ABNORMAL HIGH (ref 70–99)

## 2017-11-04 LAB — MAGNESIUM: MAGNESIUM: 2 mg/dL (ref 1.7–2.4)

## 2017-11-04 NOTE — Progress Notes (Signed)
CSW spoke with patient's daughter, [name], regarding discharge plan. CSW explained that since patient did not meet Medicare guidelines, they would not pay for rehab stay at SNF. She expressed understanding and requested an Assisted Living list, since privately paying at a SNF is out of her budget. CSW provided them at patient's bedside to go with [facility]. CSW also encouraged her to apply for Medicaid after she had spent down patient's assets. CSW signing off.  Cedric Fishman LCSW [phone]

## 2017-11-04 NOTE — Discharge Summary (Signed)
Physician Discharge Summary  Jazyiah Yiu MVE:[id number] DOB: [birth year] DOA: 11/02/2017  PCP: Lawerance Cruel, MD  Admit date: 11/02/2017 Discharge date: 11/04/2017  Admitted From: Home Disposition: Home   Recommendations for Outpatient Follow-up:  1. Follow up with PCP as soon as possible to facilitate nursing home and/or assisted living placement.  Home Health: PT, OT, RN, aide, CSW Equipment/Devices: None new Discharge Condition: Stable CODE STATUS: DNR Diet recommendation: Heart healthy, carb-modified  Brief/Interim Summary: Candice Miller is a 84yo F w/ a hx ofHTN, HLD, DM, COPD on 2 L nasal cannula oxygen at home, GERD, breast cancer, iron deficiency anemia, NPH, dementia, and SVT who presented with generalized weakness, increased confusion, and back pain. Her daughter reports she is no longer able to care for her at home.   In the ED she was found to haveWBC 6.9, negative urinalysis, electrolytes & renal function okay, temperature normal, no tachycardia, oxygen saturation 100% on 2 L nasal cannula oxygen, CT head negative for acute intracranial abnormalities. Chest x-ray negative. CT abdomen/pelviswas negative foracute intra-abdominal issues, but showedacute-subacute appearing anterior compression of L2 vertebra with about 50% loss of height in addition to stable chronic compression fractures. Mental status returned to baseline in the ED and plan was to discharge home though the family felt she was unable to care for the patient so she was admitted. Urinalysis was suggestive of UTI though urine culture was negative and ciprofloxacin was discontinued. She had transient urinary retention which resolved on voiding trial 7/24 without interventions.  I believe the patient would be safest and optimally served by discharge to a nursing home as recommended by physical therapy and occupational therapy. The patient's daughter has been caring for her for almost 6 years at home and feels  she can no longer take care of her. Medicare requires 3 midnight inpatient hospitalization before paying for placement. Unfortunately this patient does not require any further evaluation or treatments that necessitate inpatient services and is stable for discharge. Social work has met with the patient's daughter though the cost for facility placement out of pocket would be $8000 which she cannot afford. Care management has worked diligently and home health services will be maximized for this patient. Even with these efforts, I fear she is at high risk for readmission.  Discharge Diagnoses:  Principal Problem:   Acute metabolic encephalopathy Active Problems:   COPD (chronic obstructive pulmonary disease) (HCC)   Diabetes mellitus without complication (HCC)   HTN (hypertension)   Dementia without behavioral disturbance   GERD (gastroesophageal reflux disease)   Paroxysmal SVT (supraventricular tachycardia) (HCC)   Chronic back pain   Hyperlipidemia   Compression fracture of L2 lumbar vertebra (HCC)  Acute metabolic encephalopathy: Unclear if this was due to delirium, mild TBI, pain in the back, sedative medications, and/or chronic dementia. She has returned to baseline. - Continue namenda  Chronic back pain: Musculoskeletal due to chronic compression deformities in T12 and L1, stable as well as acute/subacute L2 compression fracture in addition to multilevel degenerative spondylolysis and facet hypertrophy. No significant stenosis noted.  - Continue tylenol for pain (has not taken any stronger pain medications while in the hospital).   Frequent falls:  - Needs placement, as above this is fiscally untenable and medicare would not cover.  - Maximize home health services  Acute urinary retention: Possibly due to pain. Negative urine culture. Resolved spontaneously.  - prn follow up with urology  Chronic hypoxic respiratory failure: Continue supplemental oxygen  Other chronic medical  conditions  were stable.   Discharge Instructions Discharge Instructions    Diet - low sodium heart healthy   Complete by:  As directed    Discharge instructions   Complete by:  As directed    You were admitted for back pain that is likely due to chronic compression fractures in the spine. Fortunately, there is no impingement on the spinal cord and you are taking tylenol alone for the pain. Your urinary retention has resolved on its own and you are stable for discharge. The safest place for you to be discharged to would be a nursing home or assisted living facility, though your insurance will not pay for this. Therefore, your home health services will be maximized by care management.  - Continue taking tylenol for pain - Follow up with your primary care doctor, Dr. Harrington Challenger, as soon as possible to see if he can facilitate placement.  - If your symptoms worsen, seek medical attention right away.   Increase activity slowly   Complete by:  As directed      Allergies as of 11/04/2017      Reactions   Adhesive [tape] Other (See Comments)   SKIN IS FRAGILE AND WILL TEAR VERY EASILY!!!   Macrodantin [nitrofurantoin] Other (See Comments)   "Made me hurt all over"   Sulfa Antibiotics Swelling   Swelling of the throat, but patient denies that it impaired her breathing   Keflex [cephalexin] Rash      Medication List    STOP taking these medications   guaiFENesin 600 MG 12 hr tablet Commonly known as:  MUCINEX     TAKE these medications   acetaminophen 500 MG tablet Commonly known as:  TYLENOL Take 1,000 mg by mouth every 6 (six) hours. What changed:  Another medication with the same name was removed. Continue taking this medication, and follow the directions you see here.   albuterol 108 (90 Base) MCG/ACT inhaler Commonly known as:  PROVENTIL HFA;VENTOLIN HFA Inhale 2 puffs into the lungs every 6 (six) hours as needed for wheezing or shortness of breath.   ASPERCREME LIDOCAINE 4 %  Ptch Generic drug:  Lidocaine Apply 1 patch topically daily as needed (for back pain). What changed:  Another medication with the same name was removed. Continue taking this medication, and follow the directions you see here.   atorvastatin 10 MG tablet Commonly known as:  LIPITOR Take 10 mg by mouth daily.   budesonide-formoterol 80-4.5 MCG/ACT inhaler Commonly known as:  SYMBICORT Inhale 2 puffs into the lungs 2 (two) times daily.   calcium gluconate 500 MG tablet Take 1 tablet by mouth daily.   cholecalciferol 1000 units tablet Commonly known as:  VITAMIN D Take 1,000 Units by mouth daily.   diltiazem 180 MG 24 hr capsule Commonly known as:  CARDIZEM CD Take 180 mg by mouth daily.   fexofenadine 180 MG tablet Commonly known as:  ALLEGRA Take 180 mg by mouth daily.   Fish Oil 1000 MG Caps Take 1,000 mg by mouth daily.   fluticasone 50 MCG/ACT nasal spray Commonly known as:  FLONASE Place 2 sprays into both nostrils daily.   gabapentin 300 MG capsule Commonly known as:  NEURONTIN Take 300 mg by mouth at bedtime.   glimepiride 1 MG tablet Commonly known as:  AMARYL Take 1 mg by mouth See admin instructions. Take 1 mg by mouth once a day only if BGL is >150, per provider   ipratropium-albuterol 0.5-2.5 (3) MG/3ML Soln Commonly known as:  DUONEB Take 3 mLs by nebulization 4 (four) times daily.   iron polysaccharides 150 MG capsule Commonly known as:  NIFEREX Take 150 mg by mouth 2 (two) times daily.   l-methylfolate-B6-B12 3-35-2 MG Tabs tablet Commonly known as:  METANX Take 1 tablet by mouth 2 (two) times daily.   LASIX 40 MG tablet Generic drug:  furosemide Take 40 mg by mouth daily as needed for fluid or edema.   lisinopril 10 MG tablet Commonly known as:  PRINIVIL,ZESTRIL Take 10 mg by mouth daily.   Magnesium 250 MG Tabs Take 250 mg by mouth at bedtime.   memantine 10 MG tablet Commonly known as:  NAMENDA Take 10 mg by mouth 2 (two) times  daily.   nitroGLYCERIN 0.4 MG SL tablet Commonly known as:  NITROSTAT Place 0.4 mg under the tongue every 5 (five) minutes as needed for chest pain.   ondansetron 4 MG tablet Commonly known as:  ZOFRAN Take 1 tablet (4 mg total) by mouth every 6 (six) hours as needed for nausea.   OXYGEN Inhale 2 L into the lungs continuous.   polyethylene glycol packet Commonly known as:  MIRALAX / GLYCOLAX Take 17 g by mouth daily. What changed:    when to take this  reasons to take this   ranitidine 150 MG tablet Commonly known as:  ZANTAC Take 150 mg by mouth 2 (two) times daily.   sodium chloride 0.65 % Soln nasal spray Commonly known as:  OCEAN Place 1 spray into both nostrils as needed for congestion.   sucralfate 1 g tablet Commonly known as:  CARAFATE Take 1 g by mouth 2 (two) times daily.   traZODone 50 MG tablet Commonly known as:  DESYREL Take 50 mg by mouth at bedtime.      Follow-up Information    Schedule an appointment as soon as possible for a visit  with Lawerance Cruel, MD.   Specialty:  [facility] Contact information: [place] [address] [phone]          Allergies  Allergen Reactions  . Adhesive [Tape] Other (See Comments)    SKIN IS FRAGILE AND WILL TEAR VERY EASILY!!!  . Macrodantin [Nitrofurantoin] Other (See Comments)    "Made me hurt all over"  . Sulfa Antibiotics Swelling    Swelling of the throat, but patient denies that it impaired her breathing  . Keflex [Cephalexin] Rash    Consultations:  None  Procedures/Studies: Ct Abdomen Pelvis Wo Contrast  Result Date: 11/03/2017 CLINICAL DATA:  Bilateral leg pain. Burning with urination. Fall a couple of months ago. EXAM: CT ABDOMEN AND PELVIS WITHOUT CONTRAST TECHNIQUE: Multidetector CT imaging of the abdomen and pelvis was performed following the standard protocol without IV contrast. COMPARISON:  CT pelvis 06/15/2017 FINDINGS: Lower chest: Emphysematous  changes and scarring in the lung bases. Coronary artery calcifications. Small esophageal hiatal hernia. Hepatobiliary: Surgical absence of the gallbladder. No focal liver lesions. No bile duct dilatation. Pancreas: Unremarkable. No pancreatic ductal dilatation or surrounding inflammatory changes. Spleen: Normal in size without focal abnormality. Adrenals/Urinary Tract: Adrenal glands are unremarkable. Kidneys are normal, without renal calculi, focal lesion, or hydronephrosis. Bladder is unremarkable. Stomach/Bowel: Stomach is within normal limits. Appendix appears surgically absent. No evidence of bowel wall thickening, distention, or inflammatory changes. Vascular/Lymphatic: Aortic atherosclerosis. No enlarged abdominal or pelvic lymph nodes. Reproductive: Uterus and bilateral adnexa are unremarkable. Other: No abdominal wall hernia or abnormality. No abdominopelvic ascites. Musculoskeletal: Acute appearing anterior compression of  L2 vertebra with about 60% loss of height. Chronic appearing compression fractures of T8, superior endplate of W09, W11, and L1. Multiple compression fractures likely indicate osteoporosis. IMPRESSION: 1. No acute process demonstrated in the abdomen or pelvis. No evidence of bowel obstruction or inflammation. 2. Acute appearing anterior compression of L2 vertebra with about 60% loss of height. 3. Small esophageal hiatal hernia. 4. Emphysematous changes and scarring in the lung bases. Aortic Atherosclerosis (ICD10-I70.0). Electronically Signed   By: Lucienne Capers M.D.   On: 11/03/2017 01:05   Dg Chest 2 View  Result Date: 11/02/2017 CLINICAL DATA:  Altered mental status with cough EXAM: CHEST - 2 VIEW COMPARISON:  08/12/2017, 06/17/2017 FINDINGS: Chronic interstitial opacity. No acute airspace disease or effusion. Mild cardiomegaly. Aortic atherosclerosis. No pneumothorax. Stable multiple compression deformities of the thoracic and lumbar spine. IMPRESSION: No active cardiopulmonary  disease. Similar appearance of chronic interstitial disease and mild cardiomegaly. Electronically Signed   By: Donavan Foil M.D.   On: 11/02/2017 19:46   Ct Head Wo Contrast  Result Date: 11/03/2017 CLINICAL DATA:  Increased weakness and altered mental status. Patient fell a couple of months ago. EXAM: CT HEAD WITHOUT CONTRAST TECHNIQUE: Contiguous axial images were obtained from the base of the skull through the vertex without intravenous contrast. COMPARISON:  08/10/2017 FINDINGS: Brain: Diffuse cerebral atrophy. Mild ventricular dilatation consistent with central atrophy. Low-attenuation changes throughout the deep white matter consistent with small vessel ischemic changes. Focal areas of encephalomalacia extending to the cortical surface in the frontal lobes bilaterally, greater on the right. These are consistent with old infarcts and similar to previous study. No mass-effect or midline shift. No abnormal extra-axial fluid collections. Gray-white matter junctions are distinct. Basal cisterns are not effaced. No acute intracranial hemorrhage. Vascular: Intracranial arterial vascular calcifications are present. Skull: Calvarium appears intact. Sinuses/Orbits: Mild mucosal thickening in the paranasal sinuses. No acute air-fluid levels. Mastoid air cells are clear. Other: None. IMPRESSION: No acute intracranial abnormalities. Chronic atrophy and small vessel ischemic changes. 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 Electronically Signed   By: Lucienne Capers M.D.   On: 11/03/2017 00:20   Ct L-spine No Charge  Result Date: 11/03/2017 CLINICAL DATA:  Initial evaluation for increase weakness, recent fall. EXAM: CT LUMBAR SPINE WITHOUT CONTRAST TECHNIQUE: Multidetector CT imaging of the lumbar spine was performed without intravenous contrast administration. Multiplanar CT image reconstructions were also generated. COMPARISON:  Prior CT from 06/15/2017 FINDINGS: Segmentation: Normal segmentation. Lowest  well-formed disc labeled the L5-S1 level. Alignment: Mild levoscoliosis with exaggeration of the normal lumbar lordosis. Trace 2 mm retrolisthesis of L2 on L3. Vertebrae: Acute to subacute appearing compression fracture involving the inferior endplate of L2, new relative to prior CT from 06/15/2017. Associated height loss of up to approximately 50% with 3 mm bony retropulsion. This appears benign/mechanical in nature. Chronic compression deformities involving the T12 and L1 levels are stable from previous. Vertebral body heights otherwise maintained. No other acute or interval fracture. Visualized sacrum and pelvis intact. SI joints approximated symmetric. Benign bone island noted within the right sacral ala. No other discrete or worrisome osseous lesions. Paraspinal and other soft tissues: Paraspinous soft tissues demonstrate no acute finding. Extensive aorto bi-iliac atherosclerotic disease. Cholecystectomy clips noted. Disc levels: T12-L1: 4 mm bony retropulsion related to the chronic L1 compression fracture. Flattening of the ventral thecal sac without significant spinal stenosis. Right greater than left facet hypertrophy. Foramina remain patent. L1-2: Diffuse disc bulge with disc desiccation and intervertebral disc space narrowing.  Mild facet hypertrophy. No significant stenosis. L2-3: 3 mm bony retropulsion related to the L2 compression fracture mildly flattens and indents the ventral thecal sac. Associated mild disc bulge with disc desiccation. No significant stenosis. Mild bilateral L2 foraminal narrowing. L3-4: Mild diffuse disc bulge. Mild facet and ligament flavum hypertrophy. No significant spinal stenosis. Mild to moderate bilateral L3 foraminal narrowing. L4-5: Mild diffuse disc bulge. Mild facet and ligament flavum hypertrophy. No significant spinal stenosis. Mild to moderate bilateral L4 foraminal narrowing. L5-S1: Minimal disc bulge with facet hypertrophy. No significant stenosis. IMPRESSION: 1.  Acute to subacute appearing compression fracture involving the inferior endplate of L2 with up to 50% height loss and 3 mm bony retropulsion. No significant stenosis. This is benign/mechanical in appearance. 2. Additional chronic compression deformities involving the T12 and L1 vertebral bodies, stable. 3. Multilevel degenerative spondylolysis and facet hypertrophy as above without significant spinal stenosis. Mild-to-moderate bilateral foraminal narrowing at L2 through L4. Electronically Signed   By: Jeannine Boga M.D.   On: 11/03/2017 01:36    Subjective: Pain in the lower back radiating to both legs for the past 3-4   Discharge Exam: Vitals:   11/04/17 1503 11/04/17 1656  BP: 128/68   Pulse: 77   Resp: 20   Temp: 98.1 F (36.7 C)   SpO2: 98% 98%   General: Pt is alert, awake, not in acute distress Cardiovascular: RRR, S1/S2 +, no rubs, no gallops Respiratory: CTA bilaterally, no wheezing, no rhonchi Abdominal: Soft, NT, ND, bowel sounds + Extremities: No edema, no cyanosis Neuro: Alert, confused but oriented to person, place, and hospital. No sensory deficits throughout. Legs with 4/5 strength bilaterally. No saddle anesthesia.  Labs: BNP (last 3 results) Recent Labs    08/12/17 1851  BNP 93.8   Basic Metabolic Panel: Recent Labs  Lab 11/02/17 1900 11/03/17 0401 11/04/17 0623  NA 135 136 135  K 4.7 3.8 3.9  CL 98 101 98  CO2 _0 GLUCOSE 79 92 112*  BUN _1 CREATININE 0.81 0.76 0.89  CALCIUM 9.2 8.9 8.9  MG  --   --  2.0   Liver Function Tests: Recent Labs  Lab 11/02/17 1900  AST 41  ALT 15  ALKPHOS 85  BILITOT 0.8  PROT 6.5  ALBUMIN 3.7   No results for input(s): LIPASE, AMYLASE in the last 168 hours. No results for input(s): AMMONIA in the last 168 hours. CBC: Recent Labs  Lab 11/02/17 1900 11/03/17 0401 11/04/17 0623  WBC 6.9 7.3 7.3  NEUTROABS 4.0  --   --   HGB 10.5* 10.1* 9.5*  HCT 34.0* 32.2* 30.6*  MCV 87.0 87.3 86.4   PLT 223 236 246   Cardiac Enzymes: No results for input(s): CKTOTAL, CKMB, CKMBINDEX, TROPONINI in the last 168 hours. BNP: Invalid input(s): POCBNP CBG: Recent Labs  Lab 11/03/17 1743 11/03/17 2128 11/04/17 0753 11/04/17 1221 11/04/17 1628  GLUCAP 148* 153* 115* 169* 212*   D-Dimer No results for input(s): DDIMER in the last 72 hours. Hgb A1c No results for input(s): HGBA1C in the last 72 hours. Lipid Profile No results for input(s): CHOL, HDL, LDLCALC, TRIG, CHOLHDL, LDLDIRECT in the last 72 hours. Thyroid function studies No results for input(s): TSH, T4TOTAL, T3FREE, THYROIDAB in the last 72 hours.  Invalid input(s): FREET3 Anemia work up No results for input(s): VITAMINB12, FOLATE, FERRITIN, TIBC, IRON, RETICCTPCT in the last 72 hours. Urinalysis    Component Value Date/Time   COLORURINE STRAW (A)  11/02/2017 [place] 11/02/2017 1952   LABSPEC 1.006 11/02/2017 1952   PHURINE 8.0 11/02/2017 1952   GLUCOSEU NEGATIVE 11/02/2017 1952   HGBUR NEGATIVE 11/02/2017 [place] NEGATIVE 11/02/2017 [place] NEGATIVE 11/02/2017 1952   PROTEINUR NEGATIVE 11/02/2017 1952   NITRITE NEGATIVE 11/02/2017 1952   LEUKOCYTESUR NEGATIVE 11/02/2017 1952    Microbiology Recent Results (from the past 240 hour(s))  Urine culture     Status: Abnormal   Collection Time: 11/02/17  7:54 PM  Result Value Ref Range Status   Specimen Description URINE, RANDOM  Final   Special Requests NONE  Final   Culture (A)  Final    <10,000 COLONIES/mL INSIGNIFICANT GROWTH Performed at [facility], [place] [address], [place], [place] [address]    Report Status 11/03/2017 FINAL  Final    Time coordinating discharge: Approximately 40 minutes  Patrecia Pour, MD  [facility] 11/04/2017, 5:00 PM Pager [phone]

## 2017-11-04 NOTE — Evaluation (Signed)
Occupational Therapy Evaluation Patient Details Name: Candice Miller MRN: [id number] DOB: 1924-05-18 Today's Date: 11/04/2017    History of Present Illness 84 y.o. female with medical history significant of hypertension, hyperlipidemia, diabetes mellitus, COPD on 2 L nasal cannula oxygen at home, GERD, breast cancer, iron deficiency anemia, NPH, dementia, SVT, who presents with generalized weakness, confusion and back pain.   Clinical Impression   Pt with decline in function and safety with ADLs and ADL mobility with decreased strength, balance and endurance. PTA. Pt lives with her daughter and son in law and used a RW, some assist with ADLs. Pt now requires extensive assist with ADLs and mod A with mobility using RW. Pt would benefit from acute OT services to address impairments to maximize level of function and safety    Follow Up Recommendations  SNF    Equipment Recommendations  Other (comment)(TBD at SNF)    Recommendations for Other Services       Precautions / Restrictions Precautions Precautions: Fall Restrictions Weight Bearing Restrictions: No      Mobility Bed Mobility Overal bed mobility: Needs Assistance Bed Mobility: Supine to Sit;Sit to Supine     Supine to sit: Mod assist Sit to supine: Mod assist   General bed mobility comments: moderate assist to elevate trunk to upright and rotate hips to EOB  Transfers   Equipment used: 1 person hand held assist;Rolling walker (2 wheeled) Transfers: Sit to/from [facility] Sit to Stand: Mod assist Stand pivot transfers: Mod assist       General transfer comment: required increased time and effort, cues for correct hand placement    Balance Overall balance assessment: Needs assistance;History of Falls   Sitting balance-Leahy Scale: Fair Sitting balance - Comments: able to sit EOB    Standing balance support: Bilateral upper extremity supported;Single extremity supported;During functional  activity Standing balance-Leahy Scale: Poor Standing balance comment: reliance on bilateral UE support                           ADL either performed or assessed with clinical judgement   ADL Overall ADL's : Needs assistance/impaired Eating/Feeding: Independent;Sitting   Grooming: Wash/dry hands;Wash/dry face;Minimal assistance;Standing   Upper Body Bathing: Min guard;Sitting   Lower Body Bathing: Maximal assistance   Upper Body Dressing : Min guard;Sitting   Lower Body Dressing: Maximal assistance   Toilet Transfer: Moderate assistance;Ambulation;RW;Comfort height toilet;BSC;Grab bars   Toileting- Clothing Manipulation and Hygiene: Maximal assistance;Sit to/from stand       Functional mobility during ADLs: Moderate assistance;Rolling walker       Vision Baseline Vision/History: Wears glasses Wears Glasses: Reading only Patient Visual Report: No change from baseline       Perception     Praxis      Pertinent Vitals/Pain Pain Assessment: No/denies pain Faces Pain Scale: No hurt Pain Intervention(s): Monitored during session     Hand Dominance Right   Extremity/Trunk Assessment Upper Extremity Assessment Upper Extremity Assessment: Generalized weakness   Lower Extremity Assessment Lower Extremity Assessment: Defer to PT evaluation       Communication Communication Communication: HOH   Cognition Arousal/Alertness: Awake/alert Behavior During Therapy: WFL for tasks assessed/performed Overall Cognitive Status: No family/caregiver present to determine baseline cognitive functioning                                     General Comments  Exercises     Shoulder Instructions      Home Living Family/patient expects to be discharged to:: Skilled nursing facility Living Arrangements: Children                                      Prior Functioning/Environment Level of Independence: Needs assistance  Gait /  Transfers Assistance Needed: Ambulates with rollator vs RW household distances ADL's / Homemaking Assistance Needed: daughter and aide assist with adl task performance daily   Comments: typically uses rollator for mobility. Has home aide every morning for 4 hours        OT Problem List: Decreased strength;Decreased activity tolerance;Decreased knowledge of use of DME or AE;Impaired balance (sitting and/or standing);Decreased coordination;Decreased safety awareness;Decreased knowledge of precautions;Obesity      OT Treatment/Interventions: Self-care/ADL training;Therapeutic exercise;DME and/or AE instruction;Neuromuscular education;Therapeutic activities;Patient/family education    OT Goals(Current goals can be found in the care plan section) Acute Rehab OT Goals Patient Stated Goal: to feel better OT Goal Formulation: With patient Time For Goal Achievement: 11/18/17 Potential to Achieve Goals: Good ADL Goals Pt Will Perform Grooming: with min guard assist;standing Pt Will Perform Upper Body Bathing: with supervision;with set-up;sitting Pt Will Perform Lower Body Bathing: with mod assist;sitting/lateral leans Pt Will Perform Upper Body Dressing: with supervision;with set-up;sitting Pt Will Transfer to Toilet: with min assist;ambulating;regular height toilet;bedside commode;grab bars Pt Will Perform Toileting - Clothing Manipulation and hygiene: with mod assist;sit to/from stand  OT Frequency: Min 2X/week   Barriers to D/C: Decreased caregiver support          Co-evaluation              AM-PAC PT "6 Clicks" Daily Activity     Outcome Measure Help from another person eating meals?: None Help from another person taking care of personal grooming?: A Little Help from another person toileting, which includes using toliet, bedpan, or urinal?: A Lot Help from another person bathing (including washing, rinsing, drying)?: A Lot Help from another person to put on and taking off  regular upper body clothing?: A Little Help from another person to put on and taking off regular lower body clothing?: A Lot 6 Click Score: 16   End of Session Equipment Utilized During Treatment: Gait belt;Rolling walker;Other (comment)(BSC)  Activity Tolerance: Patient tolerated treatment well Patient left: in bed;with call bell/phone within reach;with bed alarm set  OT Visit Diagnosis: Unsteadiness on feet (R26.81);Other abnormalities of gait and mobility (R26.89);Muscle weakness (generalized) (M62.81);History of falling (Z91.81)                Time: 6294-7654 OT Time Calculation (min): 24 min Charges:  OT General Charges $OT Visit: 1 Visit OT Evaluation $OT Eval Moderate Complexity: 1 Mod G-Codes: OT G-codes **NOT FOR INPATIENT CLASS** Functional Assessment Tool Used: AM-PAC 6 Clicks Daily Activity     Britt Bottom 11/04/2017, 12:15 PM

## 2017-11-04 NOTE — Care Management Note (Addendum)
Case Management Note  Patient Details  Name: Candice Miller MRN: [id number] Date of Birth: 28-Dec-1924  Subjective/Objective:    Acute metabolic encephalopathy. Hx of hypertension, hyperlipidemia, diabetes mellitus, COPD on 2 L nasal cannula oxygen at home, GERD, breast cancer, iron deficiency anemia, NPH, dementia, SVT.Resides with daughter, [name]. PTA active with [employer] ( RN,PT,NA).  WIO:MBTDHRC [place]  Action/Plan: Transition to home with home health services to follow. Daughter, [name] to provide 24/7 supervision for pt. [facility] called and pt pickup arranged for transportation to home .NCM made nurse and pt's daughter aware  Expected Discharge Date:  11/04/17               Expected Discharge Plan:     In-House Referral:  Clinical Social Work  Discharge planning Services  CM Consult  Post Acute Care Choice:    Choice offered to:   pt/daughter  DME Arranged:   N/A DME Agency:   N/A  HH Arranged:   PT,OT,RN,NA, SW HH Agency:    [place], NCM made liaison aware of d/c plan  Status of Service:  completed  If discussed at [employer], dates discussed:    Additional Comments:  Sharin Mons, RN 11/04/2017, 5:05 PM

## 2017-11-06 DIAGNOSIS — Z111 Encounter for screening for respiratory tuberculosis: Secondary | ICD-10-CM | POA: Diagnosis not present

## 2017-11-13 DIAGNOSIS — L03031 Cellulitis of right toe: Secondary | ICD-10-CM | POA: Diagnosis not present

## 2017-11-13 DIAGNOSIS — M79674 Pain in right toe(s): Secondary | ICD-10-CM | POA: Diagnosis not present

## 2017-11-13 DIAGNOSIS — M79675 Pain in left toe(s): Secondary | ICD-10-CM | POA: Diagnosis not present

## 2017-11-18 DIAGNOSIS — D485 Neoplasm of uncertain behavior of skin: Secondary | ICD-10-CM | POA: Diagnosis not present

## 2017-11-18 DIAGNOSIS — E1142 Type 2 diabetes mellitus with diabetic polyneuropathy: Secondary | ICD-10-CM | POA: Diagnosis not present

## 2017-11-25 DIAGNOSIS — T8189XA Other complications of procedures, not elsewhere classified, initial encounter: Secondary | ICD-10-CM | POA: Diagnosis not present

## 2017-12-16 DIAGNOSIS — M79675 Pain in left toe(s): Secondary | ICD-10-CM | POA: Diagnosis not present

## 2017-12-16 DIAGNOSIS — I679 Cerebrovascular disease, unspecified: Secondary | ICD-10-CM | POA: Diagnosis not present

## 2017-12-16 DIAGNOSIS — G8929 Other chronic pain: Secondary | ICD-10-CM | POA: Diagnosis not present

## 2017-12-16 DIAGNOSIS — J449 Chronic obstructive pulmonary disease, unspecified: Secondary | ICD-10-CM | POA: Diagnosis not present

## 2017-12-16 DIAGNOSIS — Z7984 Long term (current) use of oral hypoglycemic drugs: Secondary | ICD-10-CM | POA: Diagnosis not present

## 2017-12-16 DIAGNOSIS — I10 Essential (primary) hypertension: Secondary | ICD-10-CM | POA: Diagnosis not present

## 2017-12-16 DIAGNOSIS — Z8701 Personal history of pneumonia (recurrent): Secondary | ICD-10-CM | POA: Diagnosis not present

## 2017-12-16 DIAGNOSIS — F0390 Unspecified dementia without behavioral disturbance: Secondary | ICD-10-CM | POA: Diagnosis not present

## 2017-12-16 DIAGNOSIS — Z9181 History of falling: Secondary | ICD-10-CM | POA: Diagnosis not present

## 2017-12-16 DIAGNOSIS — Z9981 Dependence on supplemental oxygen: Secondary | ICD-10-CM | POA: Diagnosis not present

## 2017-12-16 DIAGNOSIS — S32020D Wedge compression fracture of second lumbar vertebra, subsequent encounter for fracture with routine healing: Secondary | ICD-10-CM | POA: Diagnosis not present

## 2017-12-16 DIAGNOSIS — Z853 Personal history of malignant neoplasm of breast: Secondary | ICD-10-CM | POA: Diagnosis not present

## 2017-12-16 DIAGNOSIS — B351 Tinea unguium: Secondary | ICD-10-CM | POA: Diagnosis not present

## 2017-12-16 DIAGNOSIS — K219 Gastro-esophageal reflux disease without esophagitis: Secondary | ICD-10-CM | POA: Diagnosis not present

## 2017-12-16 DIAGNOSIS — R410 Disorientation, unspecified: Secondary | ICD-10-CM | POA: Diagnosis not present

## 2017-12-16 DIAGNOSIS — M79674 Pain in right toe(s): Secondary | ICD-10-CM | POA: Diagnosis not present

## 2017-12-16 DIAGNOSIS — M549 Dorsalgia, unspecified: Secondary | ICD-10-CM | POA: Diagnosis not present

## 2017-12-16 DIAGNOSIS — E119 Type 2 diabetes mellitus without complications: Secondary | ICD-10-CM | POA: Diagnosis not present

## 2017-12-17 DIAGNOSIS — J449 Chronic obstructive pulmonary disease, unspecified: Secondary | ICD-10-CM | POA: Diagnosis not present

## 2017-12-17 DIAGNOSIS — E119 Type 2 diabetes mellitus without complications: Secondary | ICD-10-CM | POA: Diagnosis not present

## 2017-12-17 DIAGNOSIS — S32020D Wedge compression fracture of second lumbar vertebra, subsequent encounter for fracture with routine healing: Secondary | ICD-10-CM | POA: Diagnosis not present

## 2017-12-17 DIAGNOSIS — M549 Dorsalgia, unspecified: Secondary | ICD-10-CM | POA: Diagnosis not present

## 2017-12-17 DIAGNOSIS — G8929 Other chronic pain: Secondary | ICD-10-CM | POA: Diagnosis not present

## 2017-12-17 DIAGNOSIS — I10 Essential (primary) hypertension: Secondary | ICD-10-CM | POA: Diagnosis not present

## 2017-12-22 DIAGNOSIS — E119 Type 2 diabetes mellitus without complications: Secondary | ICD-10-CM | POA: Diagnosis not present

## 2017-12-22 DIAGNOSIS — G8929 Other chronic pain: Secondary | ICD-10-CM | POA: Diagnosis not present

## 2017-12-22 DIAGNOSIS — S32020D Wedge compression fracture of second lumbar vertebra, subsequent encounter for fracture with routine healing: Secondary | ICD-10-CM | POA: Diagnosis not present

## 2017-12-22 DIAGNOSIS — M549 Dorsalgia, unspecified: Secondary | ICD-10-CM | POA: Diagnosis not present

## 2017-12-22 DIAGNOSIS — J449 Chronic obstructive pulmonary disease, unspecified: Secondary | ICD-10-CM | POA: Diagnosis not present

## 2017-12-22 DIAGNOSIS — I10 Essential (primary) hypertension: Secondary | ICD-10-CM | POA: Diagnosis not present

## 2017-12-24 DIAGNOSIS — J449 Chronic obstructive pulmonary disease, unspecified: Secondary | ICD-10-CM | POA: Diagnosis not present

## 2017-12-24 DIAGNOSIS — G8929 Other chronic pain: Secondary | ICD-10-CM | POA: Diagnosis not present

## 2017-12-24 DIAGNOSIS — S32020D Wedge compression fracture of second lumbar vertebra, subsequent encounter for fracture with routine healing: Secondary | ICD-10-CM | POA: Diagnosis not present

## 2017-12-24 DIAGNOSIS — E119 Type 2 diabetes mellitus without complications: Secondary | ICD-10-CM | POA: Diagnosis not present

## 2017-12-24 DIAGNOSIS — I10 Essential (primary) hypertension: Secondary | ICD-10-CM | POA: Diagnosis not present

## 2017-12-24 DIAGNOSIS — M549 Dorsalgia, unspecified: Secondary | ICD-10-CM | POA: Diagnosis not present

## 2017-12-29 DIAGNOSIS — E119 Type 2 diabetes mellitus without complications: Secondary | ICD-10-CM | POA: Diagnosis not present

## 2017-12-29 DIAGNOSIS — M549 Dorsalgia, unspecified: Secondary | ICD-10-CM | POA: Diagnosis not present

## 2017-12-29 DIAGNOSIS — G8929 Other chronic pain: Secondary | ICD-10-CM | POA: Diagnosis not present

## 2017-12-29 DIAGNOSIS — I10 Essential (primary) hypertension: Secondary | ICD-10-CM | POA: Diagnosis not present

## 2017-12-29 DIAGNOSIS — J449 Chronic obstructive pulmonary disease, unspecified: Secondary | ICD-10-CM | POA: Diagnosis not present

## 2017-12-29 DIAGNOSIS — S32020D Wedge compression fracture of second lumbar vertebra, subsequent encounter for fracture with routine healing: Secondary | ICD-10-CM | POA: Diagnosis not present

## 2017-12-30 DIAGNOSIS — J449 Chronic obstructive pulmonary disease, unspecified: Secondary | ICD-10-CM | POA: Diagnosis not present

## 2017-12-30 DIAGNOSIS — R296 Repeated falls: Secondary | ICD-10-CM | POA: Diagnosis not present

## 2017-12-30 DIAGNOSIS — R410 Disorientation, unspecified: Secondary | ICD-10-CM | POA: Diagnosis not present

## 2017-12-30 DIAGNOSIS — J9691 Respiratory failure, unspecified with hypoxia: Secondary | ICD-10-CM | POA: Diagnosis not present

## 2017-12-30 DIAGNOSIS — E1142 Type 2 diabetes mellitus with diabetic polyneuropathy: Secondary | ICD-10-CM | POA: Diagnosis not present

## 2017-12-31 DIAGNOSIS — E119 Type 2 diabetes mellitus without complications: Secondary | ICD-10-CM | POA: Diagnosis not present

## 2017-12-31 DIAGNOSIS — J449 Chronic obstructive pulmonary disease, unspecified: Secondary | ICD-10-CM | POA: Diagnosis not present

## 2017-12-31 DIAGNOSIS — I10 Essential (primary) hypertension: Secondary | ICD-10-CM | POA: Diagnosis not present

## 2017-12-31 DIAGNOSIS — G8929 Other chronic pain: Secondary | ICD-10-CM | POA: Diagnosis not present

## 2017-12-31 DIAGNOSIS — S32020D Wedge compression fracture of second lumbar vertebra, subsequent encounter for fracture with routine healing: Secondary | ICD-10-CM | POA: Diagnosis not present

## 2017-12-31 DIAGNOSIS — M549 Dorsalgia, unspecified: Secondary | ICD-10-CM | POA: Diagnosis not present

## 2018-01-05 DIAGNOSIS — J449 Chronic obstructive pulmonary disease, unspecified: Secondary | ICD-10-CM | POA: Diagnosis not present

## 2018-01-05 DIAGNOSIS — E119 Type 2 diabetes mellitus without complications: Secondary | ICD-10-CM | POA: Diagnosis not present

## 2018-01-05 DIAGNOSIS — M549 Dorsalgia, unspecified: Secondary | ICD-10-CM | POA: Diagnosis not present

## 2018-01-05 DIAGNOSIS — S32020D Wedge compression fracture of second lumbar vertebra, subsequent encounter for fracture with routine healing: Secondary | ICD-10-CM | POA: Diagnosis not present

## 2018-01-05 DIAGNOSIS — G8929 Other chronic pain: Secondary | ICD-10-CM | POA: Diagnosis not present

## 2018-01-05 DIAGNOSIS — I10 Essential (primary) hypertension: Secondary | ICD-10-CM | POA: Diagnosis not present

## 2018-01-07 DIAGNOSIS — I10 Essential (primary) hypertension: Secondary | ICD-10-CM | POA: Diagnosis not present

## 2018-01-07 DIAGNOSIS — S32020D Wedge compression fracture of second lumbar vertebra, subsequent encounter for fracture with routine healing: Secondary | ICD-10-CM | POA: Diagnosis not present

## 2018-01-07 DIAGNOSIS — M549 Dorsalgia, unspecified: Secondary | ICD-10-CM | POA: Diagnosis not present

## 2018-01-07 DIAGNOSIS — G8929 Other chronic pain: Secondary | ICD-10-CM | POA: Diagnosis not present

## 2018-01-07 DIAGNOSIS — J449 Chronic obstructive pulmonary disease, unspecified: Secondary | ICD-10-CM | POA: Diagnosis not present

## 2018-01-07 DIAGNOSIS — E119 Type 2 diabetes mellitus without complications: Secondary | ICD-10-CM | POA: Diagnosis not present

## 2018-01-12 DIAGNOSIS — G8929 Other chronic pain: Secondary | ICD-10-CM | POA: Diagnosis not present

## 2018-01-12 DIAGNOSIS — M549 Dorsalgia, unspecified: Secondary | ICD-10-CM | POA: Diagnosis not present

## 2018-01-12 DIAGNOSIS — J449 Chronic obstructive pulmonary disease, unspecified: Secondary | ICD-10-CM | POA: Diagnosis not present

## 2018-01-12 DIAGNOSIS — S32020D Wedge compression fracture of second lumbar vertebra, subsequent encounter for fracture with routine healing: Secondary | ICD-10-CM | POA: Diagnosis not present

## 2018-01-12 DIAGNOSIS — I10 Essential (primary) hypertension: Secondary | ICD-10-CM | POA: Diagnosis not present

## 2018-01-12 DIAGNOSIS — E119 Type 2 diabetes mellitus without complications: Secondary | ICD-10-CM | POA: Diagnosis not present

## 2018-01-14 DIAGNOSIS — M549 Dorsalgia, unspecified: Secondary | ICD-10-CM | POA: Diagnosis not present

## 2018-01-14 DIAGNOSIS — I10 Essential (primary) hypertension: Secondary | ICD-10-CM | POA: Diagnosis not present

## 2018-01-14 DIAGNOSIS — S32020D Wedge compression fracture of second lumbar vertebra, subsequent encounter for fracture with routine healing: Secondary | ICD-10-CM | POA: Diagnosis not present

## 2018-01-14 DIAGNOSIS — J449 Chronic obstructive pulmonary disease, unspecified: Secondary | ICD-10-CM | POA: Diagnosis not present

## 2018-01-14 DIAGNOSIS — E119 Type 2 diabetes mellitus without complications: Secondary | ICD-10-CM | POA: Diagnosis not present

## 2018-01-14 DIAGNOSIS — G8929 Other chronic pain: Secondary | ICD-10-CM | POA: Diagnosis not present

## 2018-01-17 ENCOUNTER — Encounter (HOSPITAL_COMMUNITY): Payer: Self-pay

## 2018-01-17 ENCOUNTER — Observation Stay (HOSPITAL_COMMUNITY)
Admission: EM | Admit: 2018-01-17 | Discharge: 2018-01-18 | Disposition: A | Discharge status: Home / Self Care | Payer: Medicare Other | Attending: Internal Medicine | Admitting: Internal Medicine

## 2018-01-17 ENCOUNTER — Emergency Department (HOSPITAL_COMMUNITY): Payer: Medicare Other

## 2018-01-17 ENCOUNTER — Other Ambulatory Visit: Payer: Self-pay

## 2018-01-17 DIAGNOSIS — R079 Chest pain, unspecified: Secondary | ICD-10-CM | POA: Diagnosis present

## 2018-01-17 DIAGNOSIS — R9431 Abnormal electrocardiogram [ECG] [EKG]: Secondary | ICD-10-CM | POA: Diagnosis not present

## 2018-01-17 DIAGNOSIS — Z87891 Personal history of nicotine dependence: Secondary | ICD-10-CM | POA: Insufficient documentation

## 2018-01-17 DIAGNOSIS — I200 Unstable angina: Secondary | ICD-10-CM

## 2018-01-17 DIAGNOSIS — Z853 Personal history of malignant neoplasm of breast: Secondary | ICD-10-CM | POA: Diagnosis not present

## 2018-01-17 DIAGNOSIS — R072 Precordial pain: Secondary | ICD-10-CM | POA: Diagnosis not present

## 2018-01-17 DIAGNOSIS — J449 Chronic obstructive pulmonary disease, unspecified: Secondary | ICD-10-CM | POA: Insufficient documentation

## 2018-01-17 DIAGNOSIS — R0789 Other chest pain: Principal | ICD-10-CM | POA: Insufficient documentation

## 2018-01-17 DIAGNOSIS — Z7984 Long term (current) use of oral hypoglycemic drugs: Secondary | ICD-10-CM | POA: Diagnosis not present

## 2018-01-17 DIAGNOSIS — Z79899 Other long term (current) drug therapy: Secondary | ICD-10-CM | POA: Insufficient documentation

## 2018-01-17 DIAGNOSIS — E119 Type 2 diabetes mellitus without complications: Secondary | ICD-10-CM | POA: Insufficient documentation

## 2018-01-17 DIAGNOSIS — R404 Transient alteration of awareness: Secondary | ICD-10-CM | POA: Diagnosis not present

## 2018-01-17 DIAGNOSIS — I10 Essential (primary) hypertension: Secondary | ICD-10-CM | POA: Insufficient documentation

## 2018-01-17 LAB — CBC
HCT: 33.3 % — ABNORMAL LOW (ref 36.0–46.0)
Hemoglobin: 10.4 g/dL — ABNORMAL LOW (ref 12.0–15.0)
MCH: 28.7 pg (ref 26.0–34.0)
MCHC: 31.2 g/dL (ref 30.0–36.0)
MCV: 92 fL (ref 78.0–100.0)
PLATELETS: 272 10*3/uL (ref 150–400)
RBC: 3.62 MIL/uL — ABNORMAL LOW (ref 3.87–5.11)
RDW: 14.4 % (ref 11.5–15.5)
WBC: 7.7 10*3/uL (ref 4.0–10.5)

## 2018-01-17 LAB — BRAIN NATRIURETIC PEPTIDE: B Natriuretic Peptide: 39.7 pg/mL (ref 0.0–100.0)

## 2018-01-17 LAB — BASIC METABOLIC PANEL
Anion gap: 9 (ref 5–15)
BUN: 14 mg/dL (ref 8–23)
CHLORIDE: 98 mmol/L (ref 98–111)
CO2: 30 mmol/L (ref 22–32)
Calcium: 9.2 mg/dL (ref 8.9–10.3)
Creatinine, Ser: 0.78 mg/dL (ref 0.44–1.00)
GFR calc Af Amer: >60 mL/min (ref >60)
GLUCOSE: 113 mg/dL — AB (ref 70–99)
Potassium: 3.8 mmol/L (ref 3.5–5.1)
SODIUM: 137 mmol/L (ref 135–145)

## 2018-01-17 LAB — GLUCOSE, CAPILLARY: GLUCOSE-CAPILLARY: 103 mg/dL — AB (ref 70–99)

## 2018-01-17 LAB — I-STAT TROPONIN, ED: Troponin i, poc: 0.02 ng/mL (ref 0.00–0.08)

## 2018-01-17 MED ORDER — INSULIN ASPART 100 UNIT/ML ~~LOC~~ SOLN
0.0000 [IU] | Freq: Three times a day (TID) | SUBCUTANEOUS | Status: DC
  Administered 2018-01-18 (×2): 1 [IU] via SUBCUTANEOUS

## 2018-01-17 MED ORDER — LIDOCAINE-GLYCERIN 5-14.4 % RE CREA: 1.0000 "application " | TOPICAL_CREAM | RECTAL | Status: DC | PRN

## 2018-01-17 MED ORDER — MEMANTINE HCL 5 MG PO TABS
10.0000 mg | ORAL_TABLET | Freq: Two times a day (BID) | ORAL | Status: DC
  Administered 2018-01-17 – 2018-01-18 (×2): 10 mg via ORAL
  Filled 2018-01-17 (×2): qty 2

## 2018-01-17 MED ORDER — ACETAMINOPHEN 500 MG PO TABS: 1000.0000 mg | ORAL_TABLET | Freq: Four times a day (QID) | ORAL | Status: DC

## 2018-01-17 MED ORDER — L-METHYLFOLATE-B6-B12 3-35-2 MG PO TABS: 1.0000 | ORAL_TABLET | Freq: Two times a day (BID) | ORAL | Status: DC

## 2018-01-17 MED ORDER — IPRATROPIUM-ALBUTEROL 0.5-2.5 (3) MG/3ML IN SOLN: 3.0000 mL | Freq: Four times a day (QID) | RESPIRATORY_TRACT | Status: DC

## 2018-01-17 MED ORDER — SUCRALFATE 1 G PO TABS
1.0000 g | ORAL_TABLET | Freq: Two times a day (BID) | ORAL | Status: DC
  Administered 2018-01-18: 1 g via ORAL
  Filled 2018-01-17: qty 1

## 2018-01-17 MED ORDER — GABAPENTIN 300 MG PO CAPS
300.0000 mg | ORAL_CAPSULE | Freq: Every day | ORAL | Status: DC
  Administered 2018-01-17: 300 mg via ORAL
  Filled 2018-01-17: qty 1

## 2018-01-17 MED ORDER — TRAZODONE HCL 50 MG PO TABS: 50.0000 mg | ORAL_TABLET | Freq: Every day | ORAL | Status: DC | PRN

## 2018-01-17 MED ORDER — ONDANSETRON HCL 4 MG PO TABS: 4.0000 mg | ORAL_TABLET | Freq: Four times a day (QID) | ORAL | Status: DC | PRN

## 2018-01-17 MED ORDER — LISINOPRIL 10 MG PO TABS
10.0000 mg | ORAL_TABLET | Freq: Every day | ORAL | Status: DC
  Administered 2018-01-18: 10 mg via ORAL
  Filled 2018-01-17: qty 1

## 2018-01-17 MED ORDER — BACITRACIN-NEOMYCIN-POLYMYXIN 400-5-5000 EX OINT: 1.0000 "application " | TOPICAL_OINTMENT | Freq: Two times a day (BID) | CUTANEOUS | Status: DC

## 2018-01-17 MED ORDER — ENOXAPARIN SODIUM 30 MG/0.3ML ~~LOC~~ SOLN
30.0000 mg | SUBCUTANEOUS | Status: DC
  Administered 2018-01-17: 30 mg via SUBCUTANEOUS
  Filled 2018-01-17: qty 0.3

## 2018-01-17 MED ORDER — MOMETASONE FURO-FORMOTEROL FUM 100-5 MCG/ACT IN AERO
2.0000 | INHALATION_SPRAY | Freq: Two times a day (BID) | RESPIRATORY_TRACT | Status: DC
  Administered 2018-01-18: 2 via RESPIRATORY_TRACT
  Filled 2018-01-17: qty 8.8

## 2018-01-17 MED ORDER — DILTIAZEM HCL ER COATED BEADS 180 MG PO CP24
180.0000 mg | ORAL_CAPSULE | Freq: Every day | ORAL | Status: DC
  Administered 2018-01-18: 180 mg via ORAL
  Filled 2018-01-17: qty 1

## 2018-01-17 MED ORDER — CALCIUM CARBONATE 1250 (500 CA) MG PO TABS
1250.0000 mg | ORAL_TABLET | Freq: Every day | ORAL | Status: DC
  Administered 2018-01-18: 1250 mg via ORAL
  Filled 2018-01-17: qty 1

## 2018-01-17 MED ORDER — BACITRACIN-NEOMYCIN-POLYMYXIN OINTMENT TUBE
TOPICAL_OINTMENT | Freq: Two times a day (BID) | CUTANEOUS | Status: DC
  Administered 2018-01-18: 09:00:00 via TOPICAL
  Filled 2018-01-17: qty 14

## 2018-01-17 MED ORDER — POLYETHYLENE GLYCOL 3350 17 G PO PACK: 17.0000 g | PACK | Freq: Every day | ORAL | Status: DC | PRN

## 2018-01-17 MED ORDER — IPRATROPIUM-ALBUTEROL 0.5-2.5 (3) MG/3ML IN SOLN
3.0000 mL | Freq: Four times a day (QID) | RESPIRATORY_TRACT | Status: DC
  Administered 2018-01-18 (×3): 3 mL via RESPIRATORY_TRACT
  Filled 2018-01-17 (×3): qty 3

## 2018-01-17 MED ORDER — LORATADINE 10 MG PO TABS: 10.0000 mg | ORAL_TABLET | Freq: Every day | ORAL | Status: DC

## 2018-01-17 MED ORDER — MAGNESIUM 250 MG PO TABS: 250.0000 mg | ORAL_TABLET | ORAL | Status: DC

## 2018-01-17 MED ORDER — ATORVASTATIN CALCIUM 10 MG PO TABS
10.0000 mg | ORAL_TABLET | Freq: Every day | ORAL | Status: DC
  Administered 2018-01-18: 10 mg via ORAL
  Filled 2018-01-17: qty 1

## 2018-01-17 MED ORDER — NITROGLYCERIN 0.4 MG SL SUBL: 0.4000 mg | SUBLINGUAL_TABLET | SUBLINGUAL | Status: DC | PRN

## 2018-01-17 MED ORDER — ONDANSETRON HCL 4 MG/2ML IJ SOLN: 4.0000 mg | Freq: Four times a day (QID) | INTRAMUSCULAR | Status: DC | PRN

## 2018-01-17 MED ORDER — SALINE SPRAY 0.65 % NA SOLN: 1.0000 | NASAL | Status: DC | PRN

## 2018-01-17 MED ORDER — VITAMIN D 1000 UNITS PO TABS
1000.0000 [IU] | ORAL_TABLET | Freq: Every day | ORAL | Status: DC
  Administered 2018-01-18: 1000 [IU] via ORAL
  Filled 2018-01-17: qty 1

## 2018-01-17 MED ORDER — ACETAMINOPHEN 325 MG PO TABS
650.0000 mg | ORAL_TABLET | ORAL | Status: DC | PRN
  Administered 2018-01-17: 650 mg via ORAL
  Filled 2018-01-17 (×2): qty 2

## 2018-01-17 MED ORDER — FLUTICASONE PROPIONATE 50 MCG/ACT NA SUSP
2.0000 | Freq: Every day | NASAL | Status: DC
  Administered 2018-01-18: 2 via NASAL
  Filled 2018-01-17: qty 16

## 2018-01-17 MED ORDER — ALBUTEROL SULFATE (2.5 MG/3ML) 0.083% IN NEBU: 3.0000 mL | INHALATION_SOLUTION | Freq: Four times a day (QID) | RESPIRATORY_TRACT | Status: DC | PRN

## 2018-01-17 MED ORDER — POLYSACCHARIDE IRON COMPLEX 150 MG PO CAPS
150.0000 mg | ORAL_CAPSULE | Freq: Two times a day (BID) | ORAL | Status: DC
  Administered 2018-01-17 – 2018-01-18 (×2): 150 mg via ORAL
  Filled 2018-01-17 (×2): qty 1

## 2018-01-17 MED ORDER — LIDOCAINE 4 % EX PTCH: 1.0000 | MEDICATED_PATCH | Freq: Two times a day (BID) | CUTANEOUS | Status: DC

## 2018-01-17 MED ORDER — OMEGA-3-ACID ETHYL ESTERS 1 G PO CAPS
1.0000 g | ORAL_CAPSULE | Freq: Every day | ORAL | Status: DC
  Administered 2018-01-18: 1 g via ORAL
  Filled 2018-01-17: qty 1

## 2018-01-17 MED ORDER — FAMOTIDINE 20 MG PO TABS
10.0000 mg | ORAL_TABLET | Freq: Two times a day (BID) | ORAL | Status: DC
  Administered 2018-01-18: 10 mg via ORAL
  Filled 2018-01-17: qty 1

## 2018-01-17 MED ORDER — FUROSEMIDE 40 MG PO TABS: 40.0000 mg | ORAL_TABLET | Freq: Every day | ORAL | Status: DC | PRN

## 2018-01-17 NOTE — ED Provider Notes (Signed)
[facility] EMERGENCY DEPARTMENT Provider Note   CSN: [id number] Arrival date & time: 01/17/18  1105     History   Chief Complaint Chief Complaint  Patient presents with  . Chest Pain    HPI Thyra Miltner is a 84 y.o. female.  With prior medical history as detailed below presents with complaint of chest pain.  Patient reports that she had chest pain earlier this morning -approximately 3 AM.  She took nitroglycerin some improvement in her symptoms.  This morning she arrives to the ED for evaluation.  She feels improved upon my exam.  She denies significant history of chest pain in the past.  She denies known prior history of CAD.  She reports substernal chest pressure.  She denies associated shortness of breath.  She denies significantly increased lower extremity edema.  She had fever or nausea.  The history is provided by the patient and medical records.  Chest Pain   This is a new problem. The current episode started 6 to 12 hours ago. The problem occurs rarely. The problem has been resolved. The pain is present in the substernal region. The pain is mild. The quality of the pain is described as pressure-like. The pain does not radiate. Duration of episode(s) is 1 hour. Pertinent negatives include no shortness of breath. She has tried nothing for the symptoms.    Past Medical History:  Diagnosis Date  . Anemia   . Breast cancer ([place]) 1994  . COPD (chronic obstructive pulmonary disease) ([place])   . Diabetes mellitus without complication ([place])    diet controlled vs. on glimepride  . Hypertension   . NPH (normal pressure hydrocephalus) ([place])   . Osteoporosis     Patient Active Problem List   Diagnosis Date Noted  . Acute metabolic encephalopathy 66/09/3014  . Compression fracture of L2 lumbar vertebra ([place]) 11/03/2017  . Hyperlipidemia 09/20/2017  . Paroxysmal SVT (supraventricular tachycardia) ([place]) 08/17/2017  . Chronic back pain 08/17/2017  . Encounter  for palliative care   . Goals of care, counseling/discussion   . Acute urinary retention 08/13/2017  . Frequent falls 08/13/2017  . Pressure injury of skin 08/13/2017  . CAP (community acquired pneumonia) 08/12/2017  . Acute respiratory failure with hypoxia ([place]) 06/20/2017  . Accident due to mechanical fall without injury 06/20/2017  . Dementia without behavioral disturbance ([place]) 06/20/2017  . Confusion 06/20/2017  . Hyperlipidemia associated with type 2 diabetes mellitus ([place]) 06/20/2017  . GERD (gastroesophageal reflux disease) 06/20/2017  . Nodule of middle lobe of right lung 06/20/2017  . COPD with acute exacerbation ([place]) 06/15/2017  . Fall at home, initial encounter 06/15/2017  . COPD (chronic obstructive pulmonary disease) ([place]) 12/31/2016  . Diabetes mellitus without complication ([place]) 04/22/3233  . History of breast cancer 12/31/2016  . HTN (hypertension) 12/31/2016  . Hemorrhoids 12/31/2016  . Nodule of left lung 12/31/2016  . Lower GI bleed 12/30/2016  . Memory loss 10/22/2012    Past Surgical History:  Procedure Laterality Date  . CHOLECYSTECTOMY    . COLONOSCOPY WITH PROPOFOL N/A 01/01/2017   Procedure: COLONOSCOPY WITH PROPOFOL;  Surgeon: Laurence Spates, MD;  Location: [facility] ENDOSCOPY;  Service: Endoscopy;  Laterality: N/A;  . MASTECTOMY  1994  . TUBAL LIGATION       OB History   None      Home Medications    Prior to Admission medications   Medication Sig Start Date End Date Taking? Authorizing Provider  acetaminophen (TYLENOL) 500 MG tablet Take  1,000 mg by mouth every 6 (six) hours.     [provider]  albuterol (PROVENTIL HFA;VENTOLIN HFA) 108 (90 Base) MCG/ACT inhaler Inhale 2 puffs into the lungs every 6 (six) hours as needed for wheezing or shortness of breath.     [provider]  atorvastatin (LIPITOR) 10 MG tablet Take 10 mg by mouth daily.    [provider]  budesonide-formoterol (SYMBICORT) 80-4.5 MCG/ACT inhaler  Inhale 2 puffs into the lungs 2 (two) times daily.    [provider]  calcium gluconate 500 MG tablet Take 1 tablet by mouth daily.    [provider]  cholecalciferol (VITAMIN D) 1000 units tablet Take 1,000 Units by mouth daily.    [provider]  diltiazem (CARDIZEM CD) 180 MG 24 hr capsule Take 180 mg by mouth daily.    [provider]  fexofenadine (ALLEGRA) 180 MG tablet Take 180 mg by mouth daily.    [provider]  fluticasone (FLONASE) 50 MCG/ACT nasal spray Place 2 sprays into both nostrils daily.    [provider]  furosemide (LASIX) 40 MG tablet Take 40 mg by mouth daily as needed for fluid or edema.     [provider]  gabapentin (NEURONTIN) 300 MG capsule Take 300 mg by mouth at bedtime.     [provider]  glimepiride (AMARYL) 1 MG tablet Take 1 mg by mouth See admin instructions. Take 1 mg by mouth once a day only if BGL is >150, per provider    [provider]  ipratropium-albuterol (DUONEB) 0.5-2.5 (3) MG/3ML SOLN Take 3 mLs by nebulization 4 (four) times daily.     [provider]  iron polysaccharides (NIFEREX) 150 MG capsule Take 150 mg by mouth 2 (two) times daily.     [provider]  l-methylfolate-B6-B12 (METANX) 3-35-2 MG TABS tablet Take 1 tablet by mouth 2 (two) times daily.     [provider]  Lidocaine (ASPERCREME LIDOCAINE) 4 % PTCH Apply 1 patch topically daily as needed (for back pain).     [provider]  lisinopril (PRINIVIL,ZESTRIL) 10 MG tablet Take 10 mg by mouth daily.    [provider]  Magnesium 250 MG TABS Take 250 mg by mouth at bedtime.    [provider]  memantine (NAMENDA) 10 MG tablet Take 10 mg by mouth 2 (two) times daily.    [provider]  nitroGLYCERIN (NITROSTAT) 0.4 MG SL tablet Place 0.4 mg under the tongue every 5 (five) minutes as needed for chest pain.    [provider]  Omega-3  Fatty Acids (FISH OIL) 1000 MG CAPS Take 1,000 mg by mouth daily.    [provider]  ondansetron (ZOFRAN) 4 MG tablet Take 1 tablet (4 mg total) by mouth every 6 (six) hours as needed for nausea. Patient not taking: Reported on 11/02/2017 08/17/17   Debbe Odea, MD  OXYGEN Inhale 2 L into the lungs continuous.    [provider]  polyethylene glycol (MIRALAX / GLYCOLAX) packet Take 17 g by mouth daily. Patient taking differently: Take 17 g by mouth daily as needed for mild constipation.  01/01/17   Florencia Reasons, MD  ranitidine (ZANTAC) 150 MG tablet Take 150 mg by mouth 2 (two) times daily.     [provider]  sodium chloride (OCEAN) 0.65 % SOLN nasal spray Place 1 spray into both nostrils as needed for congestion. 08/17/17   Debbe Odea, MD  sucralfate ([place])  1 g tablet Take 1 g by mouth 2 (two) times daily.    [provider]  traZODone (DESYREL) 50 MG tablet Take 50 mg by mouth at bedtime. 10/28/17   [provider]    Family History Family History  Problem Relation Age of Onset  . Hypertension Mother   . Diabetes Mother   . Heart failure Mother   . Stroke Mother   . Hypertension Father   . Diabetes Father   . Heart failure Father   . Stroke Father     Social History Social History   Tobacco Use  . Smoking status: Never Smoker  . Smokeless tobacco: Former Systems developer    Types: Snuff, Chew  . Tobacco comment: lived with smokers most of her life; oral tobacco for 10 years?  Substance Use Topics  . Alcohol use: No  . Drug use: No     Allergies   Adhesive [tape]; Macrodantin [nitrofurantoin]; Sulfa antibiotics; and Keflex [cephalexin]   Review of Systems Review of Systems  Respiratory: Negative for shortness of breath.   Cardiovascular: Positive for chest pain.  All other systems reviewed and are negative.    Physical Exam Updated Vital Signs BP (!) 168/67   Pulse 73   Temp 98.7 F (37.1 C) (Oral)   Resp 19   SpO2 100%    Physical Exam  Constitutional: She is oriented to person, place, and time. She appears well-developed and well-nourished. No distress.  HENT:  Head: Normocephalic and atraumatic.  Mouth/Throat: Oropharynx is clear and moist.  Eyes: Pupils are equal, round, and reactive to light. Conjunctivae and EOM are normal.  Neck: Normal range of motion. Neck supple.  Cardiovascular: Normal rate, regular rhythm and normal heart sounds.  Pulmonary/Chest: Effort normal and breath sounds normal. No respiratory distress.  Abdominal: Soft. She exhibits no distension. There is no tenderness.  Musculoskeletal: Normal range of motion. She exhibits no edema or deformity.  Neurological: She is alert and oriented to person, place, and time.  Skin: Skin is warm and dry.  Psychiatric: She has a normal mood and affect.  Nursing note and vitals reviewed.    ED Treatments / Results  Labs (all labs ordered are listed, but only abnormal results are displayed) Labs Reviewed  BASIC METABOLIC PANEL - Abnormal; Notable for the following components:      Result Value   Glucose, Bld 113 (*)    All other components within normal limits  CBC - Abnormal; Notable for the following components:   RBC 3.62 (*)    Hemoglobin 10.4 (*)    HCT 33.3 (*)    All other components within normal limits  BRAIN NATRIURETIC PEPTIDE  I-STAT TROPONIN, ED    EKG EKG Interpretation  Date/Time:  Sunday January 17 2018 11:19:00 EDT Ventricular Rate:  73 PR Interval:  204 QRS Duration: 74 QT Interval:  410 QTC Calculation: 451 R Axis:   -31 Text Interpretation:  Normal sinus rhythm Left axis deviation Left ventricular hypertrophy Abnormal ECG Confirmed by Dene Gentry [phone]) on 01/17/2018 12:30:44 PM   Radiology Dg Chest 2 View  Result Date: 01/17/2018 CLINICAL DATA:  Central chest pain without radiation, awoke patient from sleep at 0300 hours, heaviness in chest pressure in center of chest EXAM: CHEST - 2 VIEW COMPARISON:   10/31/2017 FINDINGS: Upper normal heart size. Mediastinal contours and pulmonary vascularity normal. Chronic interstitial prominence throughout both lungs with peripheral predominance likely representing chronic interstitial lung disease/fibrosis. No superimposed acute infiltrate, pleural effusion or  pneumothorax. Bones demineralized. IMPRESSION: Chronic interstitial disease/fibrosis. No acute abnormalities. Electronically Signed   By: Lavonia Dana M.D.   On: 01/17/2018 11:54    Procedures Procedures (including critical care time)  Medications Ordered in ED Medications - No data to display   Initial Impression / Assessment and Plan / ED Course  I have reviewed the triage vital signs and the nursing notes.  Pertinent labs & imaging results that were available during my care of the patient were reviewed by me and considered in my medical decision making (see chart for details).     MDM  Screen complete  Presented for evaluation of reported chest pain.  Initial screening labs are reassuring.  Troponin initially is negative.  EKG is without evidence of acute ischemia.  Patient will be admitted for observation given her complaint and comorbidities.  Hospitalist service is aware of case and will evaluate for admission.   Final Clinical Impressions(s) / ED Diagnoses   Final diagnoses:  Chest pain, unspecified type    ED Discharge Orders    None       Valarie Merino, MD 01/17/18 1436

## 2018-01-17 NOTE — H&P (Signed)
History and Physical  Candice Miller WNI:[id number] DOB: 03-Jun-1924 DOA: 01/17/2018  Referring physician:Messick, Wallis Bamberg, MD  PCP: Lawerance Cruel, MD  Outpatient Specialists: Unknown Patient coming from: [facility] ambulatory with walker from nursing home Chief Complaint: Chest pain  HPI: Candice Miller is a 84 y.o. female with medical history significant for prior history of coronary artery disease, anemia normal pressure hydrocephalus hypertension, dementia, osteoporosis, diabetes mellitus, COPD, status post breast cancer with left mastectomy, who was brought from the nursing facility because of chest pain that did not resolve after 3 nitroglycerin.  She stated the chest pain started about 3 AM.  It was located on the mid sternal she could not really describe it but it felt like a pressure which did not radiate lasted about 1 hour.  She is chest pain-free no.  Daughter at bedside stated that she was on Lasix but it was discontinued and her ankle started swelling and they had to put her back on it.  She said her foot is no swelling today but it was swollen yesterday.  She is chest pain-free now her troponin was 0.02.  Blood pressure was slightly elevated level of 166/70 BMP is now 39.   ED Course: Patient was given nitroglycerin in the emergency room  Review of Systems: Cardiovascular chest pain Musculoskeletal bilateral ankle swelling  Pt denies any nausea vomiting diaphoresis fever chills abdominal pain diarrhea.  Review of systems are otherwise negative   Past Medical History:  Diagnosis Date  . Anemia   . Breast cancer ([place]) 1994  . COPD (chronic obstructive pulmonary disease) ([place])   . Diabetes mellitus without complication ([place])    diet controlled vs. on glimepride  . Hypertension   . NPH (normal pressure hydrocephalus) ([place])   . Osteoporosis    Past Surgical History:  Procedure Laterality Date  . CHOLECYSTECTOMY    . COLONOSCOPY WITH PROPOFOL N/A 01/01/2017   Procedure:  COLONOSCOPY WITH PROPOFOL;  Surgeon: Laurence Spates, MD;  Location: [facility];  Service: Endoscopy;  Laterality: N/A;  . MASTECTOMY  1994  . TUBAL LIGATION      Social History:  reports that she has never smoked. She has quit using smokeless tobacco.  Her smokeless tobacco use included snuff and chew. She reports that she does not drink alcohol or use drugs.   Allergies  Allergen Reactions  . Adhesive [Tape] Other (See Comments)    SKIN IS FRAGILE AND WILL TEAR VERY EASILY!!!  . Macrodantin [Nitrofurantoin] Other (See Comments)    "Made me hurt all over"  . Sulfa Antibiotics Swelling    Swelling of the throat, but patient denies that it impaired her breathing  . Keflex [Cephalexin] Rash    Family History  Problem Relation Age of Onset  . Hypertension Mother   . Diabetes Mother   . Heart failure Mother   . Stroke Mother   . Hypertension Father   . Diabetes Father   . Heart failure Father   . Stroke Father       Prior to Admission medications   Medication Sig Start Date End Date Taking? Authorizing Provider  acetaminophen (TYLENOL) 500 MG tablet Take 1,000 mg by mouth every 6 (six) hours.    Yes [provider]  albuterol (PROVENTIL HFA;VENTOLIN HFA) 108 (90 Base) MCG/ACT inhaler Inhale 2 puffs into the lungs every 6 (six) hours as needed for wheezing or shortness of breath.    Yes [provider]  atorvastatin (LIPITOR) 10 MG tablet Take 10 mg  by mouth daily.   Yes [provider]  budesonide-formoterol (SYMBICORT) 80-4.5 MCG/ACT inhaler Inhale 2 puffs into the lungs 2 (two) times daily.   Yes [provider]  calcium carbonate (CALCIUM 600) 1500 (600 Ca) MG TABS tablet Take 1,200 mg by mouth daily.   Yes [provider]  cholecalciferol (VITAMIN D) 1000 units tablet Take 1,000 Units by mouth daily.   Yes [provider]  diltiazem (CARDIZEM CD) 180 MG 24 hr capsule Take 180 mg by mouth daily.   Yes [provider]  fexofenadine (ALLEGRA) 180 MG tablet Take 180 mg by mouth as needed for allergies.    Yes [provider]  fluticasone (FLONASE) 50 MCG/ACT nasal spray Place 2 sprays into both nostrils daily.   Yes [provider]  furosemide (LASIX) 40 MG tablet Take 40 mg by mouth daily as needed for fluid or edema.    Yes [provider]  gabapentin (NEURONTIN) 300 MG capsule Take 300 mg by mouth at bedtime.    Yes [provider]  glimepiride (AMARYL) 1 MG tablet Take 1 mg by mouth See admin instructions. Take 1 mg by mouth once a day only if BGL is >150, per provider   Yes [provider]  ipratropium-albuterol (DUONEB) 0.5-2.5 (3) MG/3ML SOLN Take 3 mLs by nebulization 4 (four) times daily.    Yes [provider]  iron polysaccharides (NIFEREX) 150 MG capsule Take 150 mg by mouth 2 (two) times daily.    Yes [provider]  l-methylfolate-B6-B12 (METANX) 3-35-2 MG TABS tablet Take 1 tablet by mouth 2 (two) times daily.    Yes [provider]  Lidocaine (ASPERCREME LIDOCAINE) 4 % PTCH Apply 1 patch topically 2 (two) times daily.    Yes [provider]  Lidocaine-Glycerin (PREPARATION H) 5-14.4 % CREA Place 1 application rectally as needed (Constipation). Unsupervised  Self administration    Yes [provider]  lisinopril (PRINIVIL,ZESTRIL) 10 MG tablet Take 10 mg by mouth daily.   Yes [provider]  Magnesium 250 MG TABS Take 250 mg by mouth See admin instructions. Every 24 hours as needed for constipation "May take at bedtime PRN   Yes [provider]  memantine (NAMENDA) 10 MG tablet Take 10 mg by mouth 2 (two) times daily.   Yes [provider]  neomycin-bacitracin-polymyxin (NEOSPORIN) ointment Apply 1 application topically daily. Ointment of choice and band-aid change daily until healed every day shift for wound care   Yes [provider]  nitroGLYCERIN (NITROSTAT) 0.4 MG SL  tablet Place 0.4 mg under the tongue every 5 (five) minutes as needed for chest pain.   Yes [provider]  Omega-3 Fatty Acids (FISH OIL) 1000 MG CAPS Take 1,000 mg by mouth daily.   Yes [provider]  OXYGEN Inhale 2 L into the lungs continuous.   Yes [provider]  polyethylene glycol (MIRALAX / GLYCOLAX) packet Take 17 g by mouth daily. Patient taking differently: Take 17 g by mouth daily as needed for mild constipation.  01/01/17  Yes Florencia Reasons, MD  ranitidine (ZANTAC) 150 MG tablet Take 150 mg by mouth 2 (two) times daily.    Yes [provider]  sodium chloride (OCEAN) 0.65 % SOLN nasal spray Place 1 spray into both nostrils as needed for congestion. 08/17/17  Yes Debbe Odea, MD  sucralfate (CARAFATE) 1 g tablet Take 1 g by mouth 2 (two) times daily.   Yes [provider]  traZODone (DESYREL) 50 MG tablet Take 50 mg by mouth daily as needed for sleep.  10/28/17  Yes [provider]  ondansetron (ZOFRAN) 4 MG tablet Take 1 tablet (4 mg total) by mouth every 6 (six) hours as needed for nausea. Patient not taking: Reported on 11/02/2017 08/17/17   Debbe Odea, MD    Physical Exam: BP (!) 166/70   Pulse 86   Temp 98.7 F (37.1 C) (Oral)   Resp (!) 21   SpO2 97%   General: Slightly obese female in no distress Eyes: Conjunctiva clear ENT: Denies any clear Neck: Neck is supple Cardiovascular: Regular rate and rhythm no murmur peripheral pulses are palpable Respiratory: Work of breathing is normal no rales no wheezing Abdomen: Abdomen soft full nontender no hepatosplenomegaly Skin: Skin turgor is good no rashes no diaphoresis Musculoskeletal: Full range of motion no edema Psychiatric: Pleasant appropriate Neurologic: Making good conversation speech and language are normal alert oriented x3          Labs on Admission:  Basic Metabolic Panel: Recent Labs  Lab 01/17/18 1129  NA 137  K 3.8  CL 98  CO2 30  GLUCOSE 113*    BUN 14  CREATININE 0.78  CALCIUM 9.2   Liver Function Tests: No results for input(s): AST, ALT, ALKPHOS, BILITOT, PROT, ALBUMIN in the last 168 hours. No results for input(s): LIPASE, AMYLASE in the last 168 hours. No results for input(s): AMMONIA in the last 168 hours. CBC: Recent Labs  Lab 01/17/18 1129  WBC 7.7  HGB 10.4*  HCT 33.3*  MCV 92.0  PLT 272   Cardiac Enzymes: No results for input(s): CKTOTAL, CKMB, CKMBINDEX, TROPONINI in the last 168 hours.  BNP (last 3 results) Recent Labs    08/12/17 1851 01/17/18 1129  BNP 43.4 39.7    ProBNP (last 3 results) No results for input(s): PROBNP in the last 8760 hours.  CBG: No results for input(s): GLUCAP in the last 168 hours.  Radiological Exams on Admission: Dg Chest 2 View  Result Date: 01/17/2018 CLINICAL DATA:  Central chest pain without radiation, awoke patient from sleep at 0300 hours, heaviness in chest pressure in center of chest EXAM: CHEST - 2 VIEW COMPARISON:  10/31/2017 FINDINGS: Upper normal heart size. Mediastinal contours and pulmonary vascularity normal. Chronic interstitial prominence throughout both lungs with peripheral predominance likely representing chronic interstitial lung disease/fibrosis. No superimposed acute infiltrate, pleural effusion or pneumothorax. Bones demineralized. IMPRESSION: Chronic interstitial disease/fibrosis. No acute abnormalities. Electronically Signed   By: Lavonia Dana M.D.   On: 01/17/2018 11:54    EKG: By ED MD  Assessment/Plan Present on Admission: . Chest pain  1.  Chest pain rule out MI EKG and chest x-ray are normal BMP is normal troponin is 0.02 however 2.  Hypertension still uncontrolled but reasonable we will resume her home medication 3.  COPD continue Symbicort and albuterol 4.  Type 2 diabetes mellitus controlled continue current management  Active Problems:   Chest pain   DVT prophylaxis: Lovenox  Code Status: DNR  Family Communication: Son and  daughter and their wife and husband at bedside  Disposition Plan: Back to nursing home  Consults called: None  Admission status: Observation    Cristal Deer MD [facility] Pager [phone]  If 7PM-7AM, please contact night-coverage [email or web address] Password [facility]  01/17/2018, 4:49 PM

## 2018-01-17 NOTE — ED Notes (Signed)
Pt POA [name]) stated that if she needs to be contacted her phone number is [phone]. Her husband (BJ) can also be notified if needed [phone].

## 2018-01-17 NOTE — ED Notes (Signed)
Family at bedside. 

## 2018-01-17 NOTE — ED Notes (Signed)
Patient transported to X-ray 

## 2018-01-17 NOTE — ED Triage Notes (Signed)
Pt from [facility] point facility with ems for c.o central no radiating CP that woke her out of her sleep at 3am. Pt describes it as heaviness and pressure in the center of her chest. Was given 3 Nitro and 324 ASA at facility prior to EMS arrival. When Ems arrived pt pain was back up to 10/10, pt given 3 more nitro en route with ems and pain now 8/10. Pt alert, nad at this time  BP 190/100 then 130/70 after 3 Nitro HR 84 sinus 96% on 2L [place] [id number] #[id number] LAC

## 2018-01-17 NOTE — ED Notes (Signed)
[place] (Son) [phone]

## 2018-01-18 ENCOUNTER — Ambulatory Visit (HOSPITAL_BASED_OUTPATIENT_CLINIC_OR_DEPARTMENT_OTHER): Payer: Medicare Other

## 2018-01-18 ENCOUNTER — Encounter (HOSPITAL_COMMUNITY): Payer: Self-pay | Admitting: *Deleted

## 2018-01-18 DIAGNOSIS — I5030 Unspecified diastolic (congestive) heart failure: Secondary | ICD-10-CM | POA: Diagnosis not present

## 2018-01-18 DIAGNOSIS — R58 Hemorrhage, not elsewhere classified: Secondary | ICD-10-CM | POA: Diagnosis not present

## 2018-01-18 DIAGNOSIS — R531 Weakness: Secondary | ICD-10-CM | POA: Diagnosis not present

## 2018-01-18 DIAGNOSIS — R079 Chest pain, unspecified: Secondary | ICD-10-CM

## 2018-01-18 DIAGNOSIS — R404 Transient alteration of awareness: Secondary | ICD-10-CM | POA: Diagnosis not present

## 2018-01-18 DIAGNOSIS — R0789 Other chest pain: Secondary | ICD-10-CM | POA: Diagnosis not present

## 2018-01-18 DIAGNOSIS — R072 Precordial pain: Secondary | ICD-10-CM

## 2018-01-18 DIAGNOSIS — M2550 Pain in unspecified joint: Secondary | ICD-10-CM | POA: Diagnosis not present

## 2018-01-18 DIAGNOSIS — Z7401 Bed confinement status: Secondary | ICD-10-CM | POA: Diagnosis not present

## 2018-01-18 LAB — BASIC METABOLIC PANEL
Anion gap: 12 (ref 5–15)
BUN: 16 mg/dL (ref 8–23)
CALCIUM: 9 mg/dL (ref 8.9–10.3)
CO2: 26 mmol/L (ref 22–32)
Chloride: 98 mmol/L (ref 98–111)
Creatinine, Ser: 0.78 mg/dL (ref 0.44–1.00)
GFR calc Af Amer: >60 mL/min (ref >60)
GFR calc non Af Amer: >60 mL/min (ref >60)
GLUCOSE: 80 mg/dL (ref 70–99)
Potassium: 3.6 mmol/L (ref 3.5–5.1)
Sodium: 136 mmol/L (ref 135–145)

## 2018-01-18 LAB — GLUCOSE, CAPILLARY
GLUCOSE-CAPILLARY: 147 mg/dL — AB (ref 70–99)
Glucose-Capillary: 86 mg/dL (ref 70–99)
Glucose-Capillary: 75 mg/dL (ref 70–99)
Glucose-Capillary: 135 mg/dL — ABNORMAL HIGH (ref 70–99)

## 2018-01-18 LAB — ECHOCARDIOGRAM COMPLETE: Weight: 2864 oz

## 2018-01-18 NOTE — NC FL2 (Signed)
[place] MEDICAID FL2 LEVEL OF CARE SCREENING TOOL     IDENTIFICATION  Patient Name: Candice Miller Birthdate: Jun 30, 1924 Sex: female Admission Date (Current Location): 01/17/2018  [facility] and [place] Number:  Herbalist and Address:  [facility] [place] [place] [address] [place], [place] [address]      Provider Number: [id number]  Attending Physician Name and Address:  Kayleen Memos, DO  Relative Name and Phone Number:  [name], daughter, [phone]    Current Level of Care: Hospital Recommended Level of Care: [place] Prior Approval Number:    Date Approved/Denied:   PASRR Number: [id number]  Discharge Plan: Domiciliary (Rest home)(ALF)    Current Diagnoses: Patient Active Problem List   Diagnosis Date Noted  . Chest pain 01/17/2018  . Acute metabolic encephalopathy 53/29/9242  . Compression fracture of L2 lumbar vertebra ([place]) 11/03/2017  . Hyperlipidemia 09/20/2017  . Paroxysmal SVT (supraventricular tachycardia) ([place]) 08/17/2017  . Chronic back pain 08/17/2017  . Encounter for palliative care   . Goals of care, counseling/discussion   . Acute urinary retention 08/13/2017  . Frequent falls 08/13/2017  . Pressure injury of skin 08/13/2017  . CAP (community acquired pneumonia) 08/12/2017  . Acute respiratory failure with hypoxia ([place]) 06/20/2017  . Accident due to mechanical fall without injury 06/20/2017  . Dementia without behavioral disturbance ([place]) 06/20/2017  . Confusion 06/20/2017  . Hyperlipidemia associated with type 2 diabetes mellitus ([place]) 06/20/2017  . GERD (gastroesophageal reflux disease) 06/20/2017  . Nodule of middle lobe of right lung 06/20/2017  . COPD with acute exacerbation ([place]) 06/15/2017  . Fall at home, initial encounter 06/15/2017  . COPD (chronic obstructive pulmonary disease) ([place]) 12/31/2016  . Diabetes mellitus without complication ([place]) 68/34/1962  . History of breast cancer  12/31/2016  . HTN (hypertension) 12/31/2016  . Hemorrhoids 12/31/2016  . Nodule of left lung 12/31/2016  . Lower GI bleed 12/30/2016  . Memory loss 10/22/2012    Orientation RESPIRATION BLADDER Height & Weight     Self, Place, Situation  Normal Continent Weight: 179 lb (81.2 kg) Height:     BEHAVIORAL SYMPTOMS/MOOD NEUROLOGICAL BOWEL NUTRITION STATUS      Continent Diet(heart healthy diet)  AMBULATORY STATUS COMMUNICATION OF NEEDS Skin   (baseline) Verbally Normal                       Personal Care Assistance Level of Assistance  Bathing, Feeding, Dressing(baseline)           Functional Limitations Info  Sight, Hearing, Speech Sight Info: Impaired Hearing Info: Adequate Speech Info: Adequate    SPECIAL CARE FACTORS FREQUENCY                       Contractures Contractures Info: Not present    Additional Factors Info  Code Status, Allergies, Insulin Sliding Scale Code Status Info: DNR Allergies Info: Adhesive Tape, Macrodantin Nitrofurantoin, Sulfa Antibiotics, Keflex Cephalexin   Insulin Sliding Scale Info: novolog 3x/day with meals       Current Medications (01/18/2018):  This is the current hospital active medication list Current Facility-Administered Medications  Medication Dose Route Frequency Provider Last Rate Last Dose  . acetaminophen (TYLENOL) tablet 650 mg  650 mg Oral Q4H PRN Cristal Deer, MD   650 mg at 01/17/18 2026  . albuterol (PROVENTIL) (2.5 MG/3ML) 0.083% nebulizer solution 3 mL  3 mL Nebulization Q6H PRN Cristal Deer, MD      .  atorvastatin (LIPITOR) tablet 10 mg  10 mg Oral Daily Cristal Deer, MD   10 mg at 01/18/18 0921  . calcium carbonate (OS-CAL - dosed in mg of elemental calcium) tablet 1,250 mg  1,250 mg Oral Daily Cristal Deer, MD   1,250 mg at 01/18/18 0921  . cholecalciferol (VITAMIN D) tablet 1,000 Units  1,000 Units Oral Daily Cristal Deer, MD   1,000 Units at 01/18/18 0921  . diltiazem (CARDIZEM CD)  24 hr capsule 180 mg  180 mg Oral Daily Cristal Deer, MD   180 mg at 01/18/18 0921  . enoxaparin (LOVENOX) injection 30 mg  30 mg Subcutaneous Q24H Cristal Deer, MD   30 mg at 01/17/18 2023  . famotidine (PEPCID) tablet 10 mg  10 mg Oral BID Cristal Deer, MD   10 mg at 01/18/18 0921  . fluticasone (FLONASE) 50 MCG/ACT nasal spray 2 spray  2 spray Each Nare Daily Cristal Deer, MD   2 spray at 01/18/18 3790  . furosemide (LASIX) tablet 40 mg  40 mg Oral Daily PRN Cristal Deer, MD      . gabapentin (NEURONTIN) capsule 300 mg  300 mg Oral QHS Cristal Deer, MD   300 mg at 01/17/18 2021  . insulin aspart (novoLOG) injection 0-9 Units  0-9 Units Subcutaneous TID WC Cristal Deer, MD   1 Units at 01/18/18 0920  . ipratropium-albuterol (DUONEB) 0.5-2.5 (3) MG/3ML nebulizer solution 3 mL  3 mL Nebulization QID [facility], [place], MD   3 mL at 01/18/18 0844  . iron polysaccharides (NIFEREX) capsule 150 mg  150 mg Oral BID Cristal Deer, MD   150 mg at 01/18/18 0921  . lisinopril (PRINIVIL,ZESTRIL) tablet 10 mg  10 mg Oral Daily Cristal Deer, MD   10 mg at 01/18/18 0921  . memantine (NAMENDA) tablet 10 mg  10 mg Oral BID Cristal Deer, MD   10 mg at 01/18/18 0921  . mometasone-formoterol (DULERA) 100-5 MCG/ACT inhaler 2 puff  2 puff Inhalation BID Cristal Deer, MD   2 puff at 01/18/18 0844  . neomycin-bacitracin-polymyxin (NEOSPORIN) ointment   Topical BID Einar Grad, RPH      . nitroGLYCERIN (NITROSTAT) SL tablet 0.4 mg  0.4 mg Sublingual Q5 min PRN Cristal Deer, MD      . omega-3 acid ethyl esters (LOVAZA) capsule 1 g  1 g Oral Daily Cristal Deer, MD   1 g at 01/18/18 0921  . ondansetron (ZOFRAN) injection 4 mg  4 mg Intravenous Q6H PRN Cristal Deer, MD      . polyethylene glycol (MIRALAX / GLYCOLAX) packet 17 g  17 g Oral Daily PRN Cristal Deer, MD      . sucralfate (CARAFATE) tablet 1 g  1 g Oral BID Cristal Deer, MD   1 g at 01/18/18  0921  . traZODone (DESYREL) tablet 50 mg  50 mg Oral Daily PRN Cristal Deer, MD         Discharge Medications: Please see discharge summary for a list of discharge medications.  Relevant Imaging Results:  Relevant Lab Results:   Additional Information SSN: [id number]  Estanislado Emms, LCSW

## 2018-01-18 NOTE — Progress Notes (Signed)
  Echocardiogram 2D Echocardiogram has been performed.  Jennette Dubin 01/18/2018, 11:28 AM

## 2018-01-18 NOTE — Clinical Social Work Note (Signed)
Clinical Social Work Assessment  Patient Details  Name: Candice Miller MRN: [id number] Date of Birth: 12/23/1924  Date of referral:  01/18/18               Reason for consult:  Facility Placement, Discharge Planning                Permission sought to share information with:  Facility Sport and exercise psychologist, Family Supports Permission granted to share information::     Name::     Candice Miller  Agency::  [facility] [address])  Relationship::  daughter  Contact Information:  [phone]  Housing/Transportation Living arrangements for the past 2 months:  [place] of Information:  Adult Children Patient Interpreter Needed:  None Criminal Activity/Legal Involvement Pertinent to Current Situation/Hospitalization:  No - Comment as needed Significant Relationships:  Adult Children Lives with:  Facility Resident Do you feel safe going back to the place where you live?  Yes Need for family participation in patient care:  Yes (Comment)  Care giving concerns: Patient is a resident at [facility] [address] ALF.   Social Worker assessment / plan: CSW spoke to patient's daughter, Candice Miller, on the phone. CSW introduced self and role and discussed disposition planning. Candice Miller confirmed patient lives at [place] and is agreeable for patient to return there. CSW attempted to meet with patient at bedside x3, but patient receiving care form other staff or away at procedure.  CSW to follow for medical readiness and support with discharge back to ALF.  Employment status:  Retired Forensic scientist:  Medicare PT Recommendations:  Not assessed at this time Information / Referral to community resources:     Patient/Family's Response to care: Daughter appreciative of care.  Patient/Family's Understanding of and Emotional Response to Diagnosis, Current Treatment, and Prognosis: Daughter with understanding of patient's condition and agreeable for  patient to return to ALF.  Emotional Assessment Appearance:  Appears stated age Attitude/Demeanor/Rapport:  Unable to Assess Affect (typically observed):  Unable to Assess Orientation:  Oriented to Self, Oriented to Place, Oriented to Situation Alcohol / Substance use:  Not Applicable Psych involvement (Current and /or in the community):  No (Comment)  Discharge Needs  Concerns to be addressed:  Discharge Planning Concerns, Care Coordination Readmission within the last 30 days:  No Current discharge risk:  Cognitively Impaired Barriers to Discharge:  Continued Medical Work up   Estanislado Emms, LCSW 01/18/2018, 11:36 AM

## 2018-01-18 NOTE — Discharge Instructions (Signed)
Aspirin and Your Heart Aspirin is a medicine that affects the way blood clots. Aspirin can be used to help reduce the risk of blood clots, heart attacks, and other heart-related problems. Should I take aspirin? Your health care provider will help you determine whether it is safe and beneficial for you to take aspirin daily. Taking aspirin daily may be beneficial if you:  Have had a heart attack or chest pain.  Have undergone open heart surgery such as coronary artery bypass surgery (CABG).  Have had coronary angioplasty.  Have experienced a stroke or transient ischemic attack (TIA).  Have peripheral vascular disease (PVD).  Have chronic heart rhythm problems such as atrial fibrillation.  Are there any risks of taking aspirin daily? Daily use of aspirin can increase your risk of side effects. Some of these include:  Bleeding. Bleeding problems can be minor or serious. An example of a minor problem is a cut that does not stop bleeding. An example of a more serious problem is stomach bleeding or bleeding into the brain. Your risk of bleeding is increased if you are also taking non-steroidal anti-inflammatory medicine (NSAIDs).  Increased bruising.  Upset stomach.  An allergic reaction. People who have nasal polyps have an increased risk of developing an aspirin allergy.  What are some guidelines I should follow when taking aspirin?  Take aspirin only as directed by your health care provider. Make sure you understand how much you should take and what form you should take. The two forms of aspirin are: ? Non-enteric-coated. This type of aspirin does not have a coating and is absorbed quickly. Non-enteric-coated aspirin is usually recommended for people with chest pain. This type of aspirin also comes in a chewable form. ? Enteric-coated. This type of aspirin has a special coating that releases the medicine very slowly. Enteric-coated aspirin causes less stomach upset than  non-enteric-coated aspirin. This type of aspirin should not be chewed or crushed.  Drink alcohol in moderation. Drinking alcohol increases your risk of bleeding. When should I seek medical care?  You have unusual bleeding or bruising.  You have stomach pain.  You have an allergic reaction. Symptoms of an allergic reaction include: ? Hives. ? Itchy skin. ? Swelling of the lips, tongue, or face.  You have ringing in your ears. When should I seek immediate medical care?  Your bowel movements are bloody, dark red, or black in color.  You vomit or cough up blood.  You have blood in your urine.  You cough, wheeze, or feel short of breath. If you have any of the following symptoms, this is an emergency. Do not wait to see if the pain will go away. Get medical help at once. Call your local emergency services (911 in the [place] Do not drive yourself to the hospital.  You have severe chest pain, especially if the pain is crushing or pressure-like and spreads to the arms, back, neck, or jaw.  You have stroke-like symptoms, such as: ? Loss of vision. ? Difficulty talking. ? Numbness or weakness on one side of your body. ? Numbness or weakness in your arm or leg. ? Not thinking clearly or feeling confused.  This information is not intended to replace advice given to you by your health care provider. Make sure you discuss any questions you have with your health care provider. Document Released: 03/13/2008 Document Revised: 08/08/2015 Document Reviewed: 07/06/2013 Elsevier Interactive Patient Education  2018 [place].   Nonspecific Chest Pain Chest pain can be caused  by many different conditions. There is always a chance that your pain could be related to something serious, such as a heart attack or a blood clot in your lungs. Chest pain can also be caused by conditions that are not life-threatening. If you have chest pain, it is very important to follow up with your health care  provider. What are the causes? Causes of this condition include:  Heartburn.  Pneumonia or bronchitis.  Anxiety or stress.  Inflammation around your heart (pericarditis) or lung (pleuritis or pleurisy).  A blood clot in your lung.  A collapsed lung (pneumothorax). This can develop suddenly on its own (spontaneous pneumothorax) or from trauma to the chest.  Shingles infection (varicella-zoster virus).  Heart attack.  Damage to the bones, muscles, and cartilage that make up your chest wall. This can include: ? Bruised bones due to injury. ? Strained muscles or cartilage due to frequent or repeated coughing or overwork. ? Fracture to one or more ribs. ? Sore cartilage due to inflammation (costochondritis).  What increases the risk? Risk factors for this condition may include:  Activities that increase your risk for trauma or injury to your chest.  Respiratory infections or conditions that cause frequent coughing.  Medical conditions or overeating that can cause heartburn.  Heart disease or family history of heart disease.  Conditions or health behaviors that increase your risk of developing a blood clot.  Having had chicken pox (varicella zoster).  What are the signs or symptoms? Chest pain can feel like:  Burning or tingling on the surface of your chest or deep in your chest.  Crushing, pressure, aching, or squeezing pain.  Dull or sharp pain that is worse when you move, cough, or take a deep breath.  Pain that is also felt in your back, neck, shoulder, or arm, or pain that spreads to any of these areas.  Your chest pain may come and go, or it may stay constant. How is this diagnosed? Lab tests or other studies may be needed to find the cause of your pain. Your health care provider may have you take a test called an ECG (electrocardiogram). An ECG records your heartbeat patterns at the time the test is performed. You may also have other tests, such  as:  Transthoracic echocardiogram (TTE). In this test, sound waves are used to create a picture of the heart structures and to look at how blood flows through your heart.  Transesophageal echocardiogram (TEE).This is a more advanced imaging test that takes images from inside your body. It allows your health care provider to see your heart in finer detail.  Cardiac monitoring. This allows your health care provider to monitor your heart rate and rhythm in real time.  Holter monitor. This is a portable device that records your heartbeat and can help to diagnose abnormal heartbeats. It allows your health care provider to track your heart activity for several days, if needed.  Stress tests. These can be done through exercise or by taking medicine that makes your heart beat more quickly.  Blood tests.  Other imaging tests.  How is this treated? Treatment depends on what is causing your chest pain. Treatment may include:  Medicines. These may include: ? Acid blockers for heartburn. ? Anti-inflammatory medicine. ? Pain medicine for inflammatory conditions. ? Antibiotic medicine, if an infection is present. ? Medicines to dissolve blood clots. ? Medicines to treat coronary artery disease (CAD).  Supportive care for conditions that do not require medicines. This may include: ?  Resting. ? Applying heat or cold packs to injured areas. ? Limiting activities until pain decreases.  Follow these instructions at home: Medicines  If you were prescribed an antibiotic, take it as told by your health care provider. Do not stop taking the antibiotic even if you start to feel better.  Take over-the-counter and prescription medicines only as told by your health care provider. Lifestyle  Do not use any products that contain nicotine or tobacco, such as cigarettes and e-cigarettes. If you need help quitting, ask your health care provider.  Do not drink alcohol.  Make lifestyle changes as directed  by your health care provider. These may include: ? Getting regular exercise. Ask your health care provider to suggest some activities that are safe for you. ? Eating a heart-healthy diet. A registered dietitian can help you to learn healthy eating options. ? Maintaining a healthy weight. ? Managing diabetes, if necessary. ? Reducing stress, such as with yoga or relaxation techniques. General instructions  Avoid any activities that bring on chest pain.  If heartburn is the cause for your chest pain, raise (elevate) the head of your bed about 6 inches (15 cm) by putting blocks under the legs. Sleeping with more pillows does not effectively relieve heartburn because it only changes the position of your head.  Keep all follow-up visits as told by your health care provider. This is important. This includes any further testing if your chest pain does not go away. Contact a health care provider if:  Your chest pain does not go away.  You have a rash with blisters on your chest.  You have a fever.  You have chills. Get help right away if:  Your chest pain is worse.  You have a cough that gets worse, or you cough up blood.  You have severe pain in your abdomen.  You have severe weakness.  You faint.  You have sudden, unexplained chest discomfort.  You have sudden, unexplained discomfort in your arms, back, neck, or jaw.  You have shortness of breath at any time.  You suddenly start to sweat, or your skin gets clammy.  You feel nauseous or you vomit.  You suddenly feel light-headed or dizzy.  Your heart begins to beat quickly, or it feels like it is skipping beats. These symptoms may represent a serious problem that is an emergency. Do not wait to see if the symptoms will go away. Get medical help right away. Call your local emergency services (911 in the [place] Do not drive yourself to the hospital. This information is not intended to replace advice given to you by your health  care provider. Make sure you discuss any questions you have with your health care provider. Document Released: 01/08/2005 Document Revised: 12/24/2015 Document Reviewed: 12/24/2015 Elsevier Interactive Patient Education  2017 [employer]   Nonspecific Chest Pain Chest pain can be caused by many different conditions. There is a chance that your pain could be related to something serious, such as a heart attack or a blood clot in your lungs. Chest pain can also be caused by conditions that are not life-threatening. If you have chest pain, it is very important to follow up with your doctor. Follow these instructions at home: Medicines  If you were prescribed an antibiotic medicine, take it as told by your doctor. Do not stop taking the antibiotic even if you start to feel better.  Take over-the-counter and prescription medicines only as told by your doctor. Lifestyle  Do not  use any products that contain nicotine or tobacco, such as cigarettes and e-cigarettes. If you need help quitting, ask your doctor.  Do not drink alcohol.  Make lifestyle changes as told by your doctor. These may include: ? Getting regular exercise. Ask your doctor for some activities that are safe for you. ? Eating a heart-healthy diet. A diet specialist (dietitian) can help you to learn healthy eating options. ? Staying at a healthy weight. ? Managing diabetes, if needed. ? Lowering your stress, as with deep breathing or spending time in nature. General instructions  Avoid any activities that make you feel chest pain.  If your chest pain is because of heartburn: ? Raise (elevate) the head of your bed about 6 inches (15 cm). You can do this by putting blocks under the bed legs at the head of the bed. ? Do not sleep with extra pillows under your head. That does not help heartburn.  Keep all follow-up visits as told by your doctor. This is important. This includes any further testing if your chest pain does not go  away. Contact a doctor if:  Your chest pain does not go away.  You have a rash with blisters on your chest.  You have a fever.  You have chills. Get help right away if:  Your chest pain is worse.  You have a cough that gets worse, or you cough up blood.  You have very bad (severe) pain in your belly (abdomen).  You are very weak.  You pass out (faint).  You have either of these for no clear reason: ? Sudden chest discomfort. ? Sudden discomfort in your arms, back, neck, or jaw.  You have shortness of breath at any time.  You suddenly start to sweat, or your skin gets clammy.  You feel sick to your stomach (nauseous).  You throw up (vomit).  You suddenly feel light-headed or dizzy.  Your heart starts to beat fast, or it feels like it is skipping beats. These symptoms may be an emergency. Do not wait to see if the symptoms will go away. Get medical help right away. Call your local emergency services (911 in the [place] Do not drive yourself to the hospital. This information is not intended to replace advice given to you by your health care provider. Make sure you discuss any questions you have with your health care provider. Document Released: 09/17/2007 Document Revised: 12/24/2015 Document Reviewed: 12/24/2015 Elsevier Interactive Patient Education  2017 [employer].

## 2018-01-18 NOTE — Discharge Summary (Signed)
Discharge Summary  Candice Miller BHA:[id number] DOB: 1924/06/21  PCP: Lawerance Cruel, MD  Admit date: 01/17/2018 Discharge date: 01/18/2018  Time spent: 25 minutes  Recommendations for Outpatient Follow-up:  1. Follow-up with cardiology 2. Follow-up with your PCP 3. Take your medications as prescribed  Discharge Diagnoses:  Active Hospital Problems   Diagnosis Date Noted  . Chest pain 01/17/2018    Resolved Hospital Problems  No resolved problems to display.    Discharge Condition: Stable  Diet recommendation: Resume previous diet  Vitals:   01/18/18 0845 01/18/18 1151  BP:    Pulse:    Resp:    Temp:    SpO2: 97% 100%    History of present illness:  Candice Miller is a 84 y.o. female with medical history significant for prior history of coronary artery disease, anemia normal pressure hydrocephalus hypertension, dementia, osteoporosis, diabetes mellitus, COPD, status post breast cancer with left mastectomy, who was brought from the nursing facility because of chest pain that did not resolve after 3 nitroglycerin.  She stated the chest pain started about 3 AM.  It was located on the mid sternal she could not really describe it but it felt like a pressure which did not radiate lasted about 1 hour.  She is chest pain-free no.  Daughter at bedside stated that she was on Lasix but it was discontinued and her ankle started swelling and they had to put her back on it.  She said her foot is no swelling today but it was swollen yesterday.  She is chest pain-free now her troponin was 0.02.  Blood pressure was slightly elevated level of 166/70 BMP is now 39. Patient was given nitroglycerin in the emergency room.  01/18/2018: Patient seen and examined at her bedside.  She denies any chest pain, palpitation or dyspnea at rest.  Cardiology consulted and saw the patient this morning.  Per cardiology patient states that she will not want a procedure such as a cath.  She is okay with medical  management.  On the day of discharge, the patient was hemodynamically stable.  She will need to follow-up with her cardiologist and primary care provider post hospitalization.  Hospital Course:  Active Problems:   Chest pain  Chest pain rule out ACS Twelve-lead EKG done on 01/18/2018 revealed sinus rhythm with no specific ST-T changes Troponins negative Patient states she will not want a procedure such as a cath She is okay with medical management Follow recommendations by cardiology Follow-up with cardiology outpatient  <<< RECOMMENDATIONS BY CARDIOLOGY:  Medication Recommendations:  continue home atorvastatin, diltiazem, lisinopril. If she needs additional blood pressure control, would stop diltiazem and start amlodipine (cannot find indication for rate control). Other recommendations (labs, testing, etc):  none Follow up as an outpatient:  Dr. Harrell Gave at [facility] if she wishes. >>>  Procedures:  None  Consultations:  Cardiology  Discharge Exam: BP (!) 142/55 (BP Location: Right Arm)   Pulse 68   Temp 98 F (36.7 C) (Oral)   Resp 17   Wt 81.2 kg   SpO2 100%   BMI 34.96 kg/m  . General: 84 y.o. year-old female well developed well nourished in no acute distress.  Alert and interactive. . Cardiovascular: Regular rate and rhythm with no rubs or gallops.  No thyromegaly or JVD noted.   Marland Kitchen Respiratory: Clear to auscultation with no wheezes or rales. Good inspiratory effort. . Abdomen: Soft nontender nondistended with normal bowel sounds x4 quadrants. . Musculoskeletal: Trace lower extremity  edema. 2/4 pulses in all 4 extremities. Marland Kitchen Psychiatry: Mood is appropriate for condition and setting  Discharge Instructions You were cared for by a hospitalist during your hospital stay. If you have any questions about your discharge medications or the care you received while you were in the hospital after you are discharged, you can call the unit and asked to speak with the  hospitalist on call if the hospitalist that took care of you is not available. Once you are discharged, your primary care physician will handle any further medical issues. Please note that NO REFILLS for any discharge medications will be authorized once you are discharged, as it is imperative that you return to your primary care physician (or establish a relationship with a primary care physician if you do not have one) for your aftercare needs so that they can reassess your need for medications and monitor your lab values.   Allergies as of 01/18/2018      Reactions   Adhesive [tape] Other (See Comments)   SKIN IS FRAGILE AND WILL TEAR VERY EASILY!!!   Macrodantin [nitrofurantoin] Other (See Comments)   "Made me hurt all over"   Sulfa Antibiotics Swelling   Swelling of the throat, but patient denies that it impaired her breathing   Keflex [cephalexin] Rash      Medication List    TAKE these medications   acetaminophen 500 MG tablet Commonly known as:  TYLENOL Take 1,000 mg by mouth every 6 (six) hours.   albuterol 108 (90 Base) MCG/ACT inhaler Commonly known as:  PROVENTIL HFA;VENTOLIN HFA Inhale 2 puffs into the lungs every 6 (six) hours as needed for wheezing or shortness of breath.   ASPERCREME LIDOCAINE 4 % Ptch Generic drug:  Lidocaine Apply 1 patch topically 2 (two) times daily.   atorvastatin 10 MG tablet Commonly known as:  LIPITOR Take 10 mg by mouth daily.   budesonide-formoterol 80-4.5 MCG/ACT inhaler Commonly known as:  SYMBICORT Inhale 2 puffs into the lungs 2 (two) times daily.   CALCIUM 600 1500 (600 Ca) MG Tabs tablet Generic drug:  calcium carbonate Take 1,200 mg by mouth daily.   cholecalciferol 1000 units tablet Commonly known as:  VITAMIN D Take 1,000 Units by mouth daily.   diltiazem 180 MG 24 hr capsule Commonly known as:  CARDIZEM CD Take 180 mg by mouth daily.   fexofenadine 180 MG tablet Commonly known as:  ALLEGRA Take 180 mg by mouth as  needed for allergies.   Fish Oil 1000 MG Caps Take 1,000 mg by mouth daily.   fluticasone 50 MCG/ACT nasal spray Commonly known as:  FLONASE Place 2 sprays into both nostrils daily.   gabapentin 300 MG capsule Commonly known as:  NEURONTIN Take 300 mg by mouth at bedtime.   glimepiride 1 MG tablet Commonly known as:  AMARYL Take 1 mg by mouth See admin instructions. Take 1 mg by mouth once a day only if BGL is >150, per provider   ipratropium-albuterol 0.5-2.5 (3) MG/3ML Soln Commonly known as:  DUONEB Take 3 mLs by nebulization 4 (four) times daily.   iron polysaccharides 150 MG capsule Commonly known as:  NIFEREX Take 150 mg by mouth 2 (two) times daily.   l-methylfolate-B6-B12 3-35-2 MG Tabs tablet Commonly known as:  METANX Take 1 tablet by mouth 2 (two) times daily.   LASIX 40 MG tablet Generic drug:  furosemide Take 40 mg by mouth daily as needed for fluid or edema.   lisinopril 10 MG tablet  Commonly known as:  PRINIVIL,ZESTRIL Take 10 mg by mouth daily.   Magnesium 250 MG Tabs Take 250 mg by mouth See admin instructions. Every 24 hours as needed for constipation "May take at bedtime PRN   memantine 10 MG tablet Commonly known as:  NAMENDA Take 10 mg by mouth 2 (two) times daily.   neomycin-bacitracin-polymyxin ointment Commonly known as:  NEOSPORIN Apply 1 application topically daily. Ointment of choice and band-aid change daily until healed every day shift for wound care   nitroGLYCERIN 0.4 MG SL tablet Commonly known as:  NITROSTAT Place 0.4 mg under the tongue every 5 (five) minutes as needed for chest pain.   ondansetron 4 MG tablet Commonly known as:  ZOFRAN Take 1 tablet (4 mg total) by mouth every 6 (six) hours as needed for nausea.   OXYGEN Inhale 2 L into the lungs continuous.   polyethylene glycol packet Commonly known as:  MIRALAX / GLYCOLAX Take 17 g by mouth daily. What changed:    when to take this  reasons to take this     PREPARATION H 5-14.4 % Crea Generic drug:  Lidocaine-Glycerin Place 1 application rectally as needed (Constipation). Unsupervised  Self administration   ranitidine 150 MG tablet Commonly known as:  ZANTAC Take 150 mg by mouth 2 (two) times daily.   sodium chloride 0.65 % Soln nasal spray Commonly known as:  OCEAN Place 1 spray into both nostrils as needed for congestion.   sucralfate 1 g tablet Commonly known as:  CARAFATE Take 1 g by mouth 2 (two) times daily.   traZODone 50 MG tablet Commonly known as:  DESYREL Take 50 mg by mouth daily as needed for sleep.      Allergies  Allergen Reactions  . Adhesive [Tape] Other (See Comments)    SKIN IS FRAGILE AND WILL TEAR VERY EASILY!!!  . Macrodantin [Nitrofurantoin] Other (See Comments)    "Made me hurt all over"  . Sulfa Antibiotics Swelling    Swelling of the throat, but patient denies that it impaired her breathing  . Keflex [Cephalexin] Rash   Follow-up Information    Lawerance Cruel, MD. Call in 1 day(s).   Specialty:  Family Medicine Why:  Please call for a post hospital follow-up appointment. Contact information: [place] [address] [phone]        Buford Dresser, MD. Call in 1 day(s).   Specialty:  Cardiology Why:  Please call for a post hospital follow-up appointment. Contact information: [address] [place] [address] [phone]            The results of significant diagnostics from this hospitalization (including imaging, microbiology, ancillary and laboratory) are listed below for reference.    Significant Diagnostic Studies: Dg Chest 2 View  Result Date: 01/17/2018 CLINICAL DATA:  Central chest pain without radiation, awoke patient from sleep at 0300 hours, heaviness in chest pressure in center of chest EXAM: CHEST - 2 VIEW COMPARISON:  10/31/2017 FINDINGS: Upper normal heart size. Mediastinal contours and pulmonary vascularity normal.  Chronic interstitial prominence throughout both lungs with peripheral predominance likely representing chronic interstitial lung disease/fibrosis. No superimposed acute infiltrate, pleural effusion or pneumothorax. Bones demineralized. IMPRESSION: Chronic interstitial disease/fibrosis. No acute abnormalities. Electronically Signed   By: Lavonia Dana M.D.   On: 01/17/2018 11:54    Microbiology: No results found for this or any previous visit (from the past 240 hour(s)).   Labs: Basic Metabolic Panel: Recent Labs  Lab  01/17/18 1129 01/18/18 0458  NA 137 136  K 3.8 3.6  CL 98 98  CO2 30 26  GLUCOSE 113* 80  BUN 14 16  CREATININE 0.78 0.78  CALCIUM 9.2 9.0   Liver Function Tests: No results for input(s): AST, ALT, ALKPHOS, BILITOT, PROT, ALBUMIN in the last 168 hours. No results for input(s): LIPASE, AMYLASE in the last 168 hours. No results for input(s): AMMONIA in the last 168 hours. CBC: Recent Labs  Lab 01/17/18 1129  WBC 7.7  HGB 10.4*  HCT 33.3*  MCV 92.0  PLT 272   Cardiac Enzymes: No results for input(s): CKTOTAL, CKMB, CKMBINDEX, TROPONINI in the last 168 hours. BNP: BNP (last 3 results) Recent Labs    08/12/17 1851 01/17/18 1129  BNP 43.4 39.7    ProBNP (last 3 results) No results for input(s): PROBNP in the last 8760 hours.  CBG: Recent Labs  Lab 01/17/18 2140 01/18/18 0055 01/18/18 0430 01/18/18 0910  GLUCAP 103* 86 75 135*       Signed:  Kayleen Memos, MD [facility] 01/18/2018, 11:53 AM

## 2018-01-18 NOTE — Progress Notes (Signed)
Patient will discharge back to [facility] [address]) Anticipated discharge date: 01/18/18 Family notified: [name], daughter Transportation by: Corey Harold  Nurse to call report to [phone].   CSW signing off.  Estanislado Emms, [place]  Clinical Social Worker

## 2018-01-18 NOTE — Consult Note (Signed)
Cardiology Consultation:   Patient ID: Candice Miller MRN: [id number]; DOB: 04/26/1924  Admit date: 01/17/2018 Date of Consult: 01/18/2018  Primary Care Provider: Lawerance Cruel, MD Primary Cardiologist: New  Patient Profile:   Candice Miller is a 84 y.o. female nursing home resident with a reported hx of CAD, HTN, DM, COPD on chronic home O2 (2L/min at baseline), h/o left sided breast cancer s/p mastectomy in 1994 (no radiation) and dementia who is being seen today for the evaluation of chest pain at the request of Dr. Nevada Crane, Internal Medicine.  History of Present Illness:   Candice Miller is a 84 y.o. female nursing home resident with a reported hx of CAD, HTN, DM, COPD on chronic home O2 (2L/min at baseline), h/o left sided breast cancer s/p mastectomy in 1994 (no radiation) and dementia who is being seen today for the evaluation of chest pain at the request of Dr. Nevada Crane, Internal Medicine.  History of CAD is noted in the H&P, but pt currently denying a h/o this. Her reports however may be a bit unreliable given history of dementia. While she is able to provide specific details regarding some of her past medical history, her ability  to recall specifics regarding yesterday's events is a bit difficult. No family currently present at beside to assist. Pt reports she moved to SNF ~ 1 month ago, given difficulty for family to take care of her at home and also due to increased falls.   Pt reports left sided chest pain that developed yesterday. New onset. Felt like pressure. Non radiating. She has never felt pain like that before. She is unable to state exactly how long the pain lasted, but recalls that it did get better after SL NTG. Per H&P, pt reported in the ED that her CP lasted ~ 1 hr. She is currently CP free.   She was admitted by IM for overnight observation. She was noted to have elevated BP in the ED at 178/77. Home meds were resumed and BP improved. CXR negative. BNP negative. POC  troponin negative x 1. EKG shows NSR. No ischemic abnormalities.    Past Medical History:  Diagnosis Date  . Anemia   . Breast cancer ([place]) 1994  . COPD (chronic obstructive pulmonary disease) ([place])   . Diabetes mellitus without complication ([place])    diet controlled vs. on glimepride  . Hypertension   . NPH (normal pressure hydrocephalus) ([place])   . Osteoporosis     Past Surgical History:  Procedure Laterality Date  . CHOLECYSTECTOMY    . COLONOSCOPY WITH PROPOFOL N/A 01/01/2017   Procedure: COLONOSCOPY WITH PROPOFOL;  Surgeon: Laurence Spates, MD;  Location: [facility];  Service: Endoscopy;  Laterality: N/A;  . MASTECTOMY  1994  . TUBAL LIGATION       Home Medications:  Prior to Admission medications   Medication Sig Start Date End Date Taking? Authorizing Provider  acetaminophen (TYLENOL) 500 MG tablet Take 1,000 mg by mouth every 6 (six) hours.    Yes [provider]  albuterol (PROVENTIL HFA;VENTOLIN HFA) 108 (90 Base) MCG/ACT inhaler Inhale 2 puffs into the lungs every 6 (six) hours as needed for wheezing or shortness of breath.    Yes [provider]  atorvastatin (LIPITOR) 10 MG tablet Take 10 mg by mouth daily.   Yes [provider]  budesonide-formoterol (SYMBICORT) 80-4.5 MCG/ACT inhaler Inhale 2 puffs into the lungs 2 (two) times daily.   Yes [provider]  calcium carbonate (CALCIUM 600) 1500 (600  Ca) MG TABS tablet Take 1,200 mg by mouth daily.   Yes [provider]  cholecalciferol (VITAMIN D) 1000 units tablet Take 1,000 Units by mouth daily.   Yes [provider]  diltiazem (CARDIZEM CD) 180 MG 24 hr capsule Take 180 mg by mouth daily.   Yes [provider]  fexofenadine (ALLEGRA) 180 MG tablet Take 180 mg by mouth as needed for allergies.    Yes [provider]  fluticasone (FLONASE) 50 MCG/ACT nasal spray Place 2 sprays into both nostrils daily.   Yes [provider]  furosemide  (LASIX) 40 MG tablet Take 40 mg by mouth daily as needed for fluid or edema.    Yes [provider]  gabapentin (NEURONTIN) 300 MG capsule Take 300 mg by mouth at bedtime.    Yes [provider]  glimepiride (AMARYL) 1 MG tablet Take 1 mg by mouth See admin instructions. Take 1 mg by mouth once a day only if BGL is >150, per provider   Yes [provider]  ipratropium-albuterol (DUONEB) 0.5-2.5 (3) MG/3ML SOLN Take 3 mLs by nebulization 4 (four) times daily.    Yes [provider]  iron polysaccharides (NIFEREX) 150 MG capsule Take 150 mg by mouth 2 (two) times daily.    Yes [provider]  l-methylfolate-B6-B12 (METANX) 3-35-2 MG TABS tablet Take 1 tablet by mouth 2 (two) times daily.    Yes [provider]  Lidocaine (ASPERCREME LIDOCAINE) 4 % PTCH Apply 1 patch topically 2 (two) times daily.    Yes [provider]  Lidocaine-Glycerin (PREPARATION H) 5-14.4 % CREA Place 1 application rectally as needed (Constipation). Unsupervised  Self administration    Yes [provider]  lisinopril (PRINIVIL,ZESTRIL) 10 MG tablet Take 10 mg by mouth daily.   Yes [provider]  Magnesium 250 MG TABS Take 250 mg by mouth See admin instructions. Every 24 hours as needed for constipation "May take at bedtime PRN   Yes [provider]  memantine (NAMENDA) 10 MG tablet Take 10 mg by mouth 2 (two) times daily.   Yes [provider]  neomycin-bacitracin-polymyxin (NEOSPORIN) ointment Apply 1 application topically daily. Ointment of choice and band-aid change daily until healed every day shift for wound care   Yes [provider]  nitroGLYCERIN (NITROSTAT) 0.4 MG SL tablet Place 0.4 mg under the tongue every 5 (five) minutes as needed for chest pain.   Yes [provider]  Omega-3 Fatty Acids (FISH OIL) 1000 MG CAPS Take 1,000 mg by mouth daily.   Yes [provider]  OXYGEN Inhale 2 L into the  lungs continuous.   Yes [provider]  polyethylene glycol (MIRALAX / GLYCOLAX) packet Take 17 g by mouth daily. Patient taking differently: Take 17 g by mouth daily as needed for mild constipation.  01/01/17  Yes Florencia Reasons, MD  ranitidine (ZANTAC) 150 MG tablet Take 150 mg by mouth 2 (two) times daily.    Yes [provider]  sodium chloride (OCEAN) 0.65 % SOLN nasal spray Place 1 spray into both nostrils as needed for congestion. 08/17/17  Yes Debbe Odea, MD  sucralfate (CARAFATE) 1 g tablet Take 1 g by mouth 2 (two) times daily.   Yes [provider]  traZODone (DESYREL) 50 MG tablet Take 50 mg by mouth daily as needed for sleep.  10/28/17  Yes [provider]  ondansetron (ZOFRAN) 4 MG tablet Take 1 tablet (4 mg total) by mouth every  6 (six) hours as needed for nausea. Patient not taking: Reported on 11/02/2017 08/17/17   Debbe Odea, MD    Inpatient Medications: Scheduled Meds: . atorvastatin  10 mg Oral Daily  . calcium carbonate  1,250 mg Oral Daily  . cholecalciferol  1,000 Units Oral Daily  . diltiazem  180 mg Oral Daily  . enoxaparin (LOVENOX) injection  30 mg Subcutaneous Q24H  . famotidine  10 mg Oral BID  . fluticasone  2 spray Each Nare Daily  . gabapentin  300 mg Oral QHS  . insulin aspart  0-9 Units Subcutaneous TID WC  . ipratropium-albuterol  3 mL Nebulization QID  . iron polysaccharides  150 mg Oral BID  . lisinopril  10 mg Oral Daily  . memantine  10 mg Oral BID  . mometasone-formoterol  2 puff Inhalation BID  . neomycin-bacitracin-polymyxin   Topical BID  . omega-3 acid ethyl esters  1 g Oral Daily  . sucralfate  1 g Oral BID   Continuous Infusions:  PRN Meds: acetaminophen, albuterol, furosemide, nitroGLYCERIN, ondansetron (ZOFRAN) IV, polyethylene glycol, traZODone  Allergies:    Allergies  Allergen Reactions  . Adhesive [Tape] Other (See Comments)    SKIN IS FRAGILE AND WILL TEAR VERY EASILY!!!  . Macrodantin  [Nitrofurantoin] Other (See Comments)    "Made me hurt all over"  . Sulfa Antibiotics Swelling    Swelling of the throat, but patient denies that it impaired her breathing  . Keflex [Cephalexin] Rash    Social History:   Social History   Socioeconomic History  . Marital status: Widowed    Spouse name: Not on file  . Number of children: Not on file  . Years of education: Not on file  . Highest education level: Not on file  Occupational History  . Occupation: retired  Scientific laboratory technician  . Financial resource strain: Not on file  . Food insecurity:    Worry: Not on file    Inability: Not on file  . Transportation needs:    Medical: Not on file    Non-medical: Not on file  Tobacco Use  . Smoking status: Never Smoker  . Smokeless tobacco: Former Systems developer    Types: Snuff, Chew  . Tobacco comment: lived with smokers most of her life; oral tobacco for 10 years?  Substance and Sexual Activity  . Alcohol use: No  . Drug use: No  . Sexual activity: Not on file  Lifestyle  . Physical activity:    Days per week: Not on file    Minutes per session: Not on file  . Stress: Not on file  Relationships  . Social connections:    Talks on phone: Not on file    Gets together: Not on file    Attends religious service: Not on file    Active member of club or organization: Not on file    Attends meetings of clubs or organizations: Not on file    Relationship status: Not on file  . Intimate partner violence:    Fear of current or ex partner: Not on file    Emotionally abused: Not on file    Physically abused: Not on file    Forced sexual activity: Not on file  Other Topics Concern  . Not on file  Social History Narrative   Lives with daughter and son-in-law.  Widowed.    Family History:    Family History  Problem Relation Age of Onset  . Hypertension Mother   . Diabetes  Mother   . Heart failure Mother   . Stroke Mother   . Hypertension Father   . Diabetes Father   . Heart failure  Father   . Stroke Father      ROS:  Please see the history of present illness.   All other ROS reviewed and negative.     Physical Exam/Data:   Vitals:   01/17/18 1854 01/17/18 2034 01/18/18 0434 01/18/18 0728  BP: (!) 155/60 (!) 159/72 132/61 (!) 142/55  Pulse:  80 71 68  Resp:  17 17   Temp: 98.5 F (36.9 C) 97.9 F (36.6 C) 97.7 F (36.5 C) 98 F (36.7 C)  TempSrc: Oral Oral Oral Oral  SpO2: 98% 97% 99% 98%  Weight:   81.2 kg     Intake/Output Summary (Last 24 hours) at 01/18/2018 0804 Last data filed at 01/17/2018 2100 Gross per 24 hour  Intake 240 ml  Output -  Net 240 ml   Filed Weights   01/18/18 0434  Weight: 81.2 kg   Body mass index is 34.96 kg/m.  General:  Elderly WF, Well nourished, well developed, in no acute distress HEENT: normal Lymph: no adenopathy Neck: no JVD Endocrine:  No thryomegaly Vascular: No carotid bruits; FA pulses 2+ bilaterally without bruits  Cardiac:  normal S1, S2; RRR; no murmur  Lungs:  Crackles on the right that clear w/ cough (CXR negative)  Abd: soft, nontender, no hepatomegaly  Ext: no edema Musculoskeletal:  No deformities, BUE and BLE strength normal and equal Skin: warm and dry  Neuro:  CNs 2-12 intact, no focal abnormalities noted Psych:  Normal affect   EKG:  The EKG was personally reviewed and demonstrates:  NSR no ischemic abnormalities Telemetry:  Telemetry was personally reviewed and demonstrates:  NSR   Relevant CV Studies: None    Laboratory Data:  Chemistry Recent Labs  Lab 01/17/18 1129 01/18/18 0458  NA 137 136  K 3.8 3.6  CL 98 98  CO2 30 26  GLUCOSE 113* 80  BUN 14 16  CREATININE 0.78 0.78  CALCIUM 9.2 9.0  GFRNONAA >60 >60  GFRAA >60 >60  ANIONGAP 9 12    No results for input(s): PROT, ALBUMIN, AST, ALT, ALKPHOS, BILITOT in the last 168 hours. Hematology Recent Labs  Lab 01/17/18 1129  WBC 7.7  RBC 3.62*  HGB 10.4*  HCT 33.3*  MCV 92.0  MCH 28.7  MCHC 31.2  RDW 14.4  PLT  272   Cardiac EnzymesNo results for input(s): TROPONINI in the last 168 hours.  Recent Labs  Lab 01/17/18 1134  TROPIPOC 0.02    BNP Recent Labs  Lab 01/17/18 1129  BNP 39.7    DDimer No results for input(s): DDIMER in the last 168 hours.  Radiology/Studies:  Dg Chest 2 View  Result Date: 01/17/2018 CLINICAL DATA:  Central chest pain without radiation, awoke patient from sleep at 0300 hours, heaviness in chest pressure in center of chest EXAM: CHEST - 2 VIEW COMPARISON:  10/31/2017 FINDINGS: Upper normal heart size. Mediastinal contours and pulmonary vascularity normal. Chronic interstitial prominence throughout both lungs with peripheral predominance likely representing chronic interstitial lung disease/fibrosis. No superimposed acute infiltrate, pleural effusion or pneumothorax. Bones demineralized. IMPRESSION: Chronic interstitial disease/fibrosis. No acute abnormalities. Electronically Signed   By: Lavonia Dana M.D.   On: 01/17/2018 11:54    Assessment and Plan:   Candice Miller is a 84 y.o. female nursing home resident with a reported hx of CAD (questionable/ limited  details, pt denies, see above), HTN, DM, COPD on chronic home O2 (2L/min at baseline), h/o left sided breast cancer s/p mastectomy in 1994 (no radiation) and dementia who is being seen today for the evaluation of chest pain at the request of Dr. Nevada Crane, Internal Medicine.  1. Chest Pain: per pt report new onset left sided CP discomfort, improving w/ SL NTG. Currently CP free. EKG NSR w/o ischemic abnormalities POC troponin negative x 1. CXR negative. BNP WNL. BP was elevated on admit and improved after home meds were given. Given her advanced age, dementia and fact that she is CP free with normal EKG and negative troponin, would not recommend any further cardiac w/u at this time. Continue BP management. Can consider addition of low dose LA nitrate. MD to follow with further recommendations.     For questions or updates,  please contact [facility] Please consult [email or web address] for contact info under     Signed, Lyda Jester, PA-C  01/18/2018 8:04 AM

## 2018-01-19 DIAGNOSIS — M549 Dorsalgia, unspecified: Secondary | ICD-10-CM | POA: Diagnosis not present

## 2018-01-19 DIAGNOSIS — E119 Type 2 diabetes mellitus without complications: Secondary | ICD-10-CM | POA: Diagnosis not present

## 2018-01-19 DIAGNOSIS — J449 Chronic obstructive pulmonary disease, unspecified: Secondary | ICD-10-CM | POA: Diagnosis not present

## 2018-01-19 DIAGNOSIS — S32020D Wedge compression fracture of second lumbar vertebra, subsequent encounter for fracture with routine healing: Secondary | ICD-10-CM | POA: Diagnosis not present

## 2018-01-19 DIAGNOSIS — I10 Essential (primary) hypertension: Secondary | ICD-10-CM | POA: Diagnosis not present

## 2018-01-19 DIAGNOSIS — G8929 Other chronic pain: Secondary | ICD-10-CM | POA: Diagnosis not present

## 2018-01-21 DIAGNOSIS — G8929 Other chronic pain: Secondary | ICD-10-CM | POA: Diagnosis not present

## 2018-01-21 DIAGNOSIS — I10 Essential (primary) hypertension: Secondary | ICD-10-CM | POA: Diagnosis not present

## 2018-01-21 DIAGNOSIS — J449 Chronic obstructive pulmonary disease, unspecified: Secondary | ICD-10-CM | POA: Diagnosis not present

## 2018-01-21 DIAGNOSIS — E119 Type 2 diabetes mellitus without complications: Secondary | ICD-10-CM | POA: Diagnosis not present

## 2018-01-21 DIAGNOSIS — M549 Dorsalgia, unspecified: Secondary | ICD-10-CM | POA: Diagnosis not present

## 2018-01-21 DIAGNOSIS — S32020D Wedge compression fracture of second lumbar vertebra, subsequent encounter for fracture with routine healing: Secondary | ICD-10-CM | POA: Diagnosis not present

## 2018-01-26 DIAGNOSIS — I10 Essential (primary) hypertension: Secondary | ICD-10-CM | POA: Diagnosis not present

## 2018-01-26 DIAGNOSIS — S32020D Wedge compression fracture of second lumbar vertebra, subsequent encounter for fracture with routine healing: Secondary | ICD-10-CM | POA: Diagnosis not present

## 2018-01-26 DIAGNOSIS — G8929 Other chronic pain: Secondary | ICD-10-CM | POA: Diagnosis not present

## 2018-01-26 DIAGNOSIS — J449 Chronic obstructive pulmonary disease, unspecified: Secondary | ICD-10-CM | POA: Diagnosis not present

## 2018-01-26 DIAGNOSIS — E119 Type 2 diabetes mellitus without complications: Secondary | ICD-10-CM | POA: Diagnosis not present

## 2018-01-26 DIAGNOSIS — M549 Dorsalgia, unspecified: Secondary | ICD-10-CM | POA: Diagnosis not present

## 2018-01-27 DIAGNOSIS — Z23 Encounter for immunization: Secondary | ICD-10-CM | POA: Diagnosis not present

## 2018-01-27 DIAGNOSIS — D649 Anemia, unspecified: Secondary | ICD-10-CM | POA: Diagnosis not present

## 2018-01-27 DIAGNOSIS — K219 Gastro-esophageal reflux disease without esophagitis: Secondary | ICD-10-CM | POA: Diagnosis not present

## 2018-01-27 DIAGNOSIS — R609 Edema, unspecified: Secondary | ICD-10-CM | POA: Diagnosis not present

## 2018-01-28 DIAGNOSIS — J449 Chronic obstructive pulmonary disease, unspecified: Secondary | ICD-10-CM | POA: Diagnosis not present

## 2018-01-28 DIAGNOSIS — S32020D Wedge compression fracture of second lumbar vertebra, subsequent encounter for fracture with routine healing: Secondary | ICD-10-CM | POA: Diagnosis not present

## 2018-01-28 DIAGNOSIS — E119 Type 2 diabetes mellitus without complications: Secondary | ICD-10-CM | POA: Diagnosis not present

## 2018-01-28 DIAGNOSIS — M549 Dorsalgia, unspecified: Secondary | ICD-10-CM | POA: Diagnosis not present

## 2018-01-28 DIAGNOSIS — G8929 Other chronic pain: Secondary | ICD-10-CM | POA: Diagnosis not present

## 2018-01-28 DIAGNOSIS — I10 Essential (primary) hypertension: Secondary | ICD-10-CM | POA: Diagnosis not present

## 2018-02-02 DIAGNOSIS — G8929 Other chronic pain: Secondary | ICD-10-CM | POA: Diagnosis not present

## 2018-02-02 DIAGNOSIS — J449 Chronic obstructive pulmonary disease, unspecified: Secondary | ICD-10-CM | POA: Diagnosis not present

## 2018-02-02 DIAGNOSIS — E119 Type 2 diabetes mellitus without complications: Secondary | ICD-10-CM | POA: Diagnosis not present

## 2018-02-02 DIAGNOSIS — M549 Dorsalgia, unspecified: Secondary | ICD-10-CM | POA: Diagnosis not present

## 2018-02-02 DIAGNOSIS — I10 Essential (primary) hypertension: Secondary | ICD-10-CM | POA: Diagnosis not present

## 2018-02-02 DIAGNOSIS — S32020D Wedge compression fracture of second lumbar vertebra, subsequent encounter for fracture with routine healing: Secondary | ICD-10-CM | POA: Diagnosis not present

## 2018-02-04 DIAGNOSIS — M549 Dorsalgia, unspecified: Secondary | ICD-10-CM | POA: Diagnosis not present

## 2018-02-04 DIAGNOSIS — E119 Type 2 diabetes mellitus without complications: Secondary | ICD-10-CM | POA: Diagnosis not present

## 2018-02-04 DIAGNOSIS — S32020D Wedge compression fracture of second lumbar vertebra, subsequent encounter for fracture with routine healing: Secondary | ICD-10-CM | POA: Diagnosis not present

## 2018-02-04 DIAGNOSIS — G8929 Other chronic pain: Secondary | ICD-10-CM | POA: Diagnosis not present

## 2018-02-04 DIAGNOSIS — J449 Chronic obstructive pulmonary disease, unspecified: Secondary | ICD-10-CM | POA: Diagnosis not present

## 2018-02-04 DIAGNOSIS — I10 Essential (primary) hypertension: Secondary | ICD-10-CM | POA: Diagnosis not present

## 2018-05-10 DIAGNOSIS — N390 Urinary tract infection, site not specified: Secondary | ICD-10-CM | POA: Diagnosis not present

## 2018-05-12 DIAGNOSIS — I700 Atherosclerosis of aorta: Secondary | ICD-10-CM | POA: Diagnosis not present

## 2018-05-12 DIAGNOSIS — R300 Dysuria: Secondary | ICD-10-CM | POA: Diagnosis not present

## 2018-05-12 DIAGNOSIS — R42 Dizziness and giddiness: Secondary | ICD-10-CM | POA: Diagnosis not present

## 2018-05-12 DIAGNOSIS — F0390 Unspecified dementia without behavioral disturbance: Secondary | ICD-10-CM | POA: Diagnosis not present

## 2018-05-12 DIAGNOSIS — Z23 Encounter for immunization: Secondary | ICD-10-CM | POA: Diagnosis not present

## 2018-05-12 DIAGNOSIS — I679 Cerebrovascular disease, unspecified: Secondary | ICD-10-CM | POA: Diagnosis not present

## 2018-05-12 DIAGNOSIS — R8290 Unspecified abnormal findings in urine: Secondary | ICD-10-CM | POA: Diagnosis not present

## 2018-05-12 DIAGNOSIS — Z0000 Encounter for general adult medical examination without abnormal findings: Secondary | ICD-10-CM | POA: Diagnosis not present

## 2018-05-12 DIAGNOSIS — E1142 Type 2 diabetes mellitus with diabetic polyneuropathy: Secondary | ICD-10-CM | POA: Diagnosis not present

## 2018-05-12 DIAGNOSIS — R531 Weakness: Secondary | ICD-10-CM | POA: Diagnosis not present

## 2018-05-12 DIAGNOSIS — J449 Chronic obstructive pulmonary disease, unspecified: Secondary | ICD-10-CM | POA: Diagnosis not present

## 2018-05-12 DIAGNOSIS — M810 Age-related osteoporosis without current pathological fracture: Secondary | ICD-10-CM | POA: Diagnosis not present

## 2018-05-12 DIAGNOSIS — I10 Essential (primary) hypertension: Secondary | ICD-10-CM | POA: Diagnosis not present

## 2018-05-12 DIAGNOSIS — K219 Gastro-esophageal reflux disease without esophagitis: Secondary | ICD-10-CM | POA: Diagnosis not present

## 2018-05-25 DIAGNOSIS — M25562 Pain in left knee: Secondary | ICD-10-CM | POA: Diagnosis not present

## 2018-05-25 DIAGNOSIS — M79662 Pain in left lower leg: Secondary | ICD-10-CM | POA: Diagnosis not present

## 2018-05-25 DIAGNOSIS — M79652 Pain in left thigh: Secondary | ICD-10-CM | POA: Diagnosis not present

## 2018-07-13 DIAGNOSIS — F0390 Unspecified dementia without behavioral disturbance: Secondary | ICD-10-CM | POA: Diagnosis not present

## 2018-07-13 DIAGNOSIS — M810 Age-related osteoporosis without current pathological fracture: Secondary | ICD-10-CM | POA: Diagnosis not present

## 2018-07-13 DIAGNOSIS — J449 Chronic obstructive pulmonary disease, unspecified: Secondary | ICD-10-CM | POA: Diagnosis not present

## 2018-07-13 DIAGNOSIS — J439 Emphysema, unspecified: Secondary | ICD-10-CM | POA: Diagnosis not present

## 2018-07-13 DIAGNOSIS — E1142 Type 2 diabetes mellitus with diabetic polyneuropathy: Secondary | ICD-10-CM | POA: Diagnosis not present

## 2018-07-13 DIAGNOSIS — I10 Essential (primary) hypertension: Secondary | ICD-10-CM | POA: Diagnosis not present

## 2018-07-13 DIAGNOSIS — D649 Anemia, unspecified: Secondary | ICD-10-CM | POA: Diagnosis not present

## 2018-10-05 DIAGNOSIS — Z20828 Contact with and (suspected) exposure to other viral communicable diseases: Secondary | ICD-10-CM | POA: Diagnosis not present

## 2018-10-05 IMAGING — CT CT PELVIS W/O CM
2 of 3 series · 16 of 46 positions shown, 18 images · non-contrast
Comparison: None.

CLINICAL DATA: Fall last night, hip and back pain.

EXAM:
CT PELVIS WITHOUT CONTRAST
TECHNIQUE: Multidetector CT imaging of the pelvis was performed following the
standard protocol without intravenous contrast.

[Series 3: pelvis 2.0 st · axial · 0.68mm/px · z∈[+763,+981]mm · 13 of 127 slices shown, 15 images]
[im 9/127  soft-tissue]
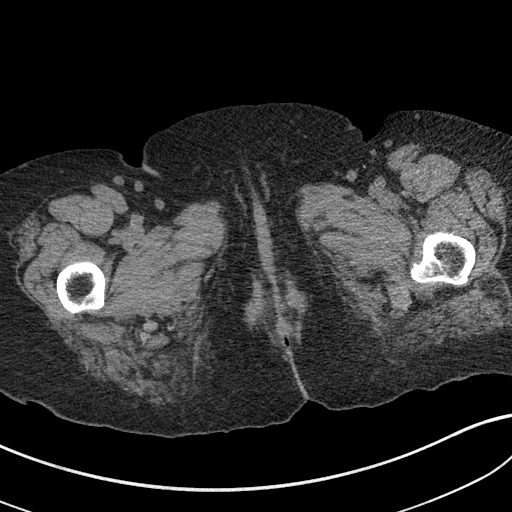
[im 9/127  bone]
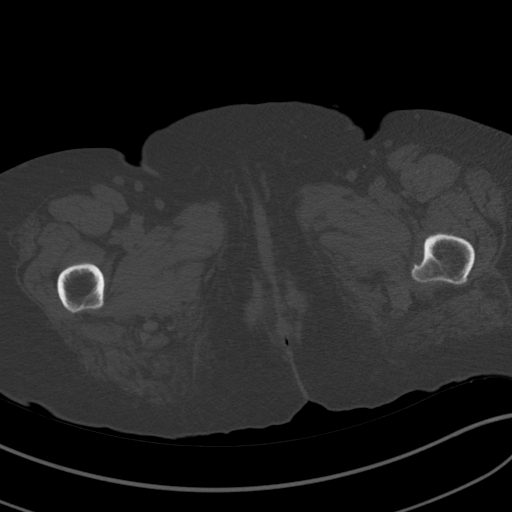
[im 17/127  soft-tissue]
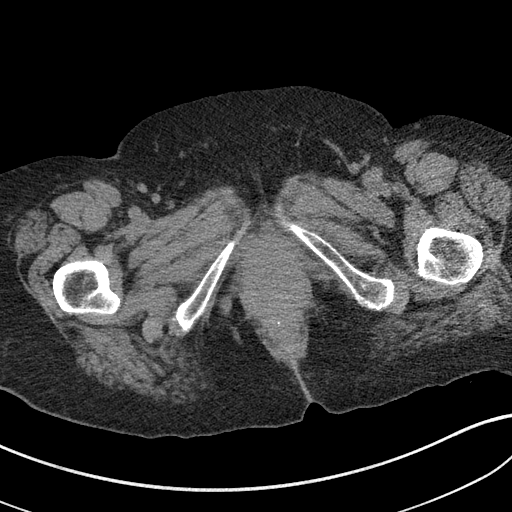
[im 25/127  soft-tissue]
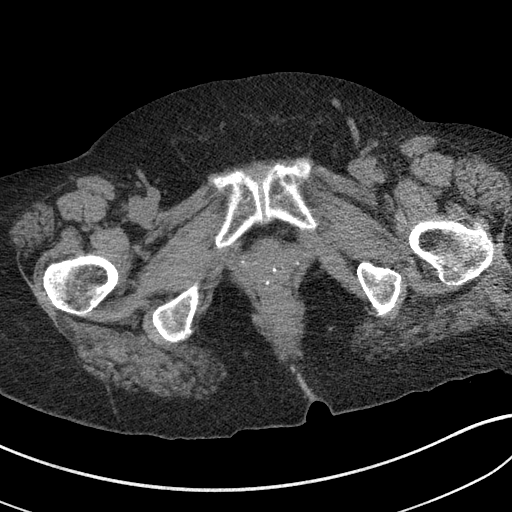
[im 37/127  soft-tissue]
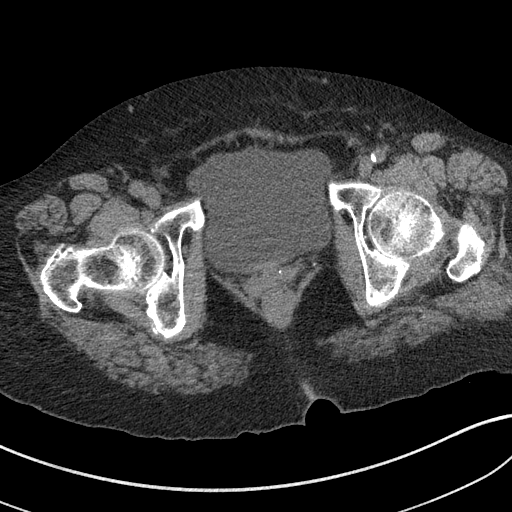
[im 45/127  soft-tissue]
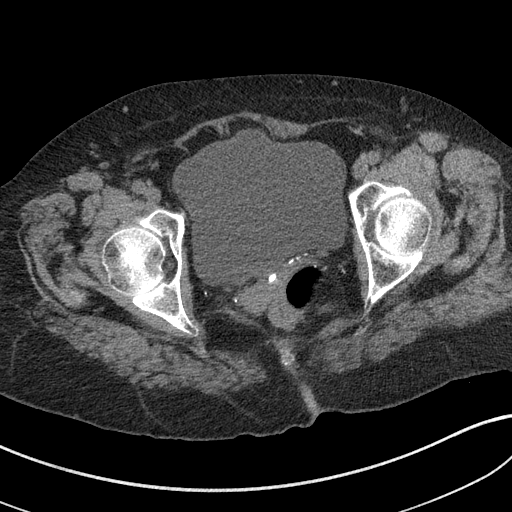
[im 53/127  soft-tissue]
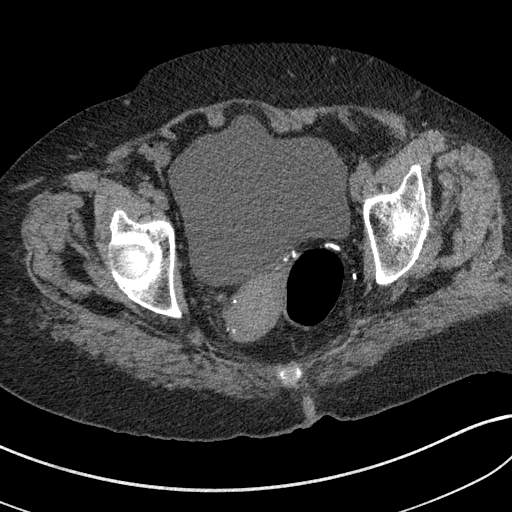
[im 66/127  soft-tissue]
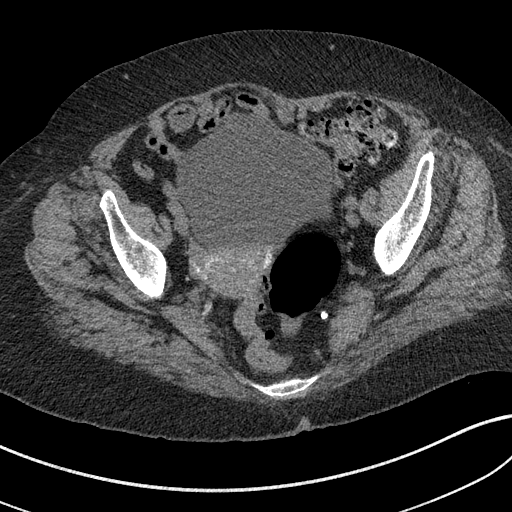
[im 74/127  soft-tissue]
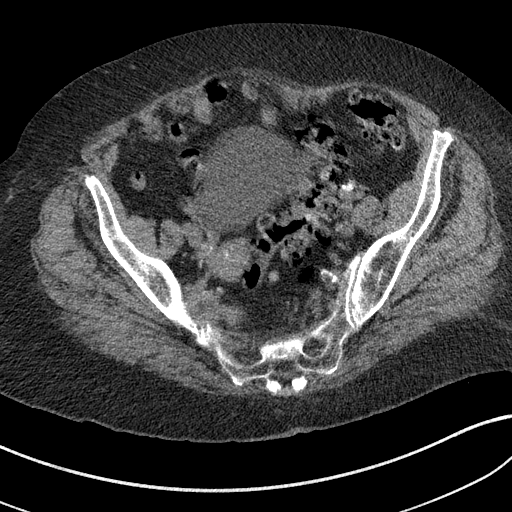
[im 82/127  soft-tissue]
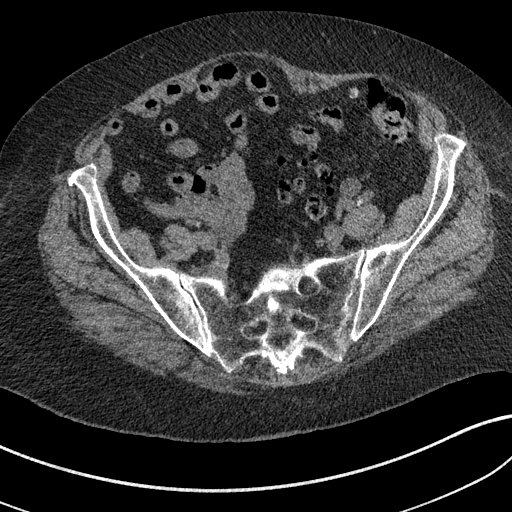
[im 82/127  bone]
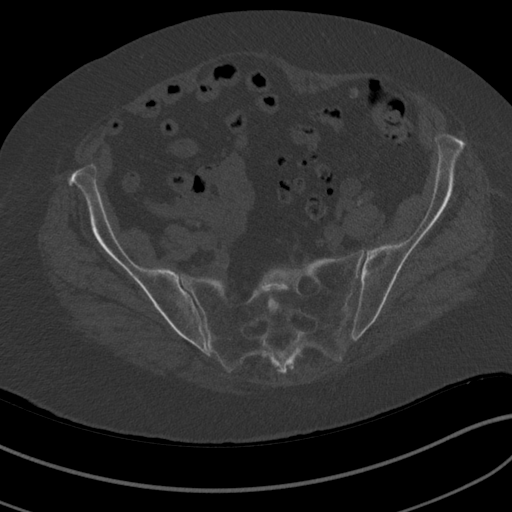
[im 90/127  soft-tissue]
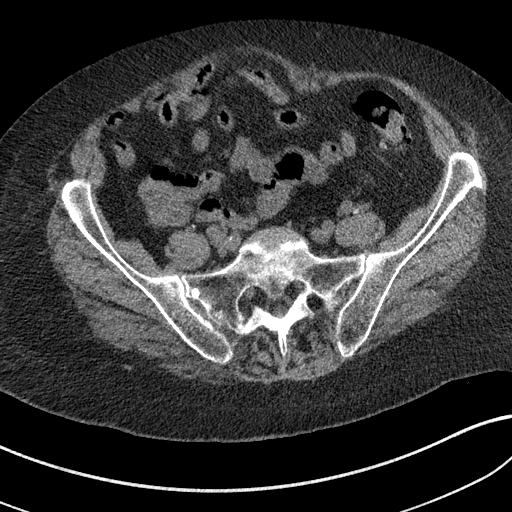
[im 102/127  soft-tissue]
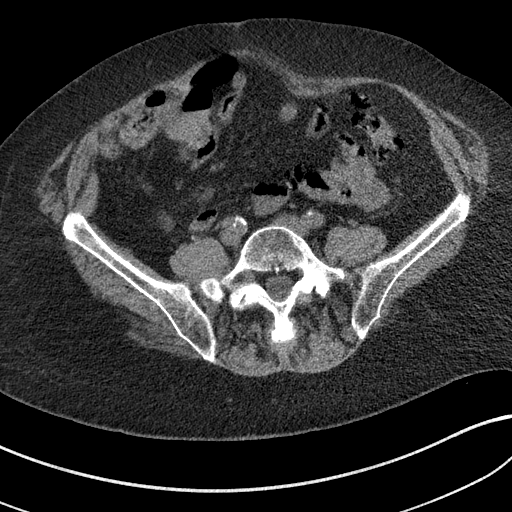
[im 110/127  soft-tissue]
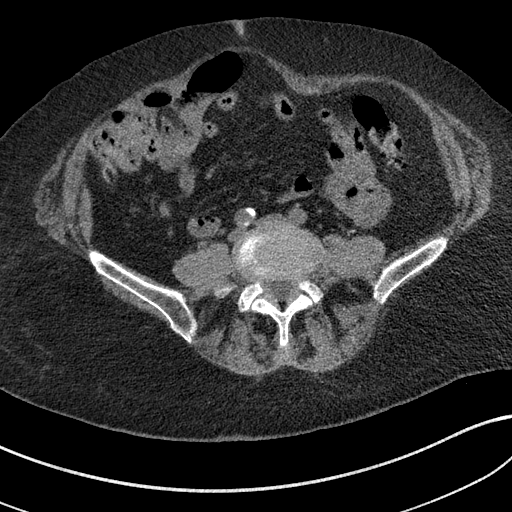
[im 118/127  soft-tissue]
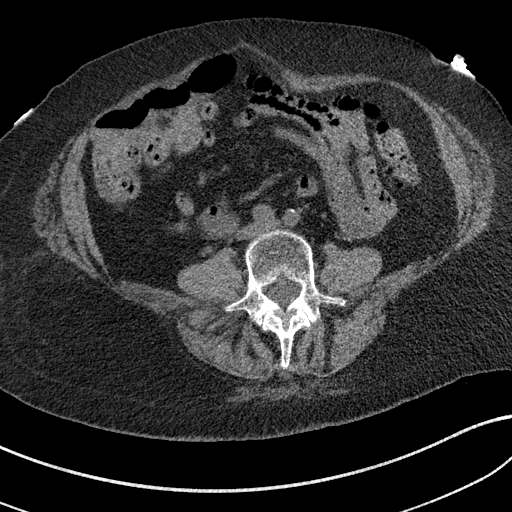

[Series 8: coronal st · coronal · 0.50mm/px · 3 of 128 slices shown]
[im 43/128  soft-tissue]
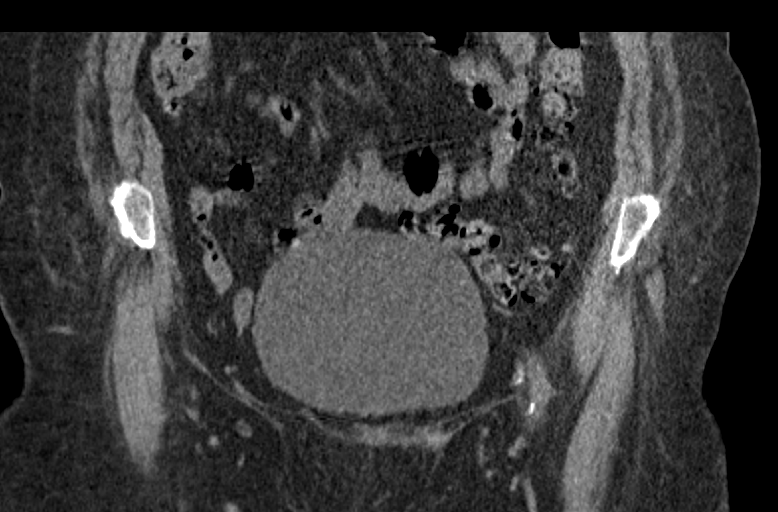
[im 57/128  soft-tissue]
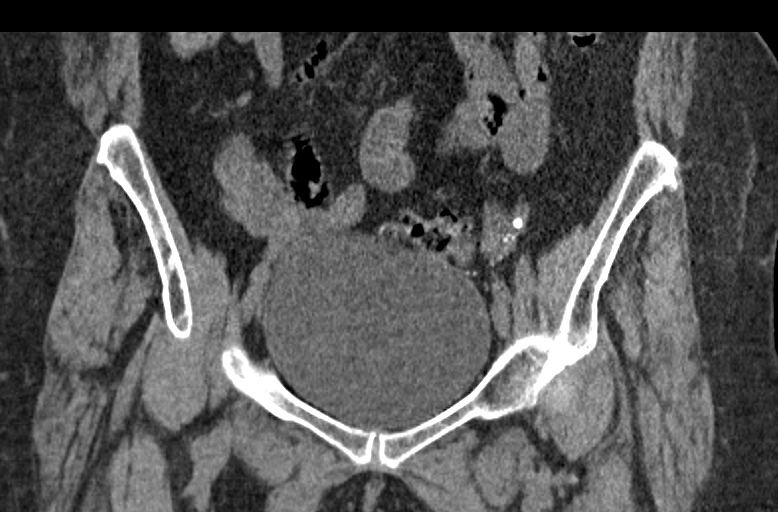
[im 71/128  soft-tissue]
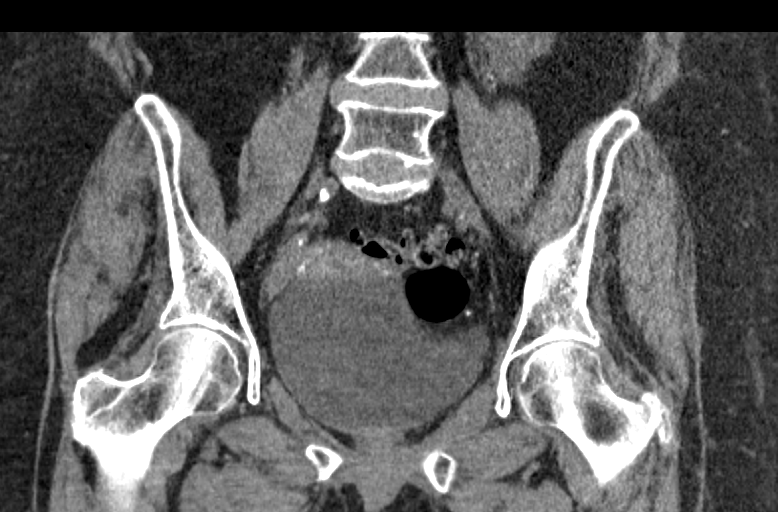

[16 of 46 positions shown; findings below may reference images not displayed]

FINDINGS: Urinary Tract:  Bladder and distal ureters are unremarkable.

Bowel: Extensive diverticulosis of the sigmoid colon and lower
descending colon but no focal inflammatory change to suggest acute
diverticulitis. No dilated large or small bowel loops within the
pelvis or lower abdomen.

Vascular/Lymphatic: No acute findings.  Atherosclerosis.

Reproductive:  No mass or other significant abnormality

Other: No free fluid or hemorrhage within the pelvis. No free
intraperitoneal air.

Musculoskeletal: No osseous fracture or dislocation. Superficial
soft tissues about the pelvis and right hip are unremarkable.
IMPRESSION: 1. No acute findings. No osseous fracture or dislocation within the
pelvis or at the right hip.
2. Colonic diverticulosis without evidence of acute diverticulitis.
3. Atherosclerosis.

## 2018-11-03 DIAGNOSIS — Z20828 Contact with and (suspected) exposure to other viral communicable diseases: Secondary | ICD-10-CM | POA: Diagnosis not present

## 2018-11-10 DIAGNOSIS — D649 Anemia, unspecified: Secondary | ICD-10-CM | POA: Diagnosis not present

## 2018-11-10 DIAGNOSIS — E1142 Type 2 diabetes mellitus with diabetic polyneuropathy: Secondary | ICD-10-CM | POA: Diagnosis not present

## 2019-02-22 IMAGING — CT CT HEAD W/O CM
4 series · 16 of 47 positions shown, 18 images · non-contrast
Comparison: 08/10/2017

CLINICAL DATA: Increased weakness and altered mental status.
Patient fell a couple of months ago.

EXAM:
CT HEAD WITHOUT CONTRAST
TECHNIQUE: Contiguous axial images were obtained from the base of the skull
through the vertex without intravenous contrast.

[Series 3: head wo · axial · 0.42mm/px · z∈[-156,-36]mm · 7 of 32 slices shown, 9 images]
[im 4/32  brain]
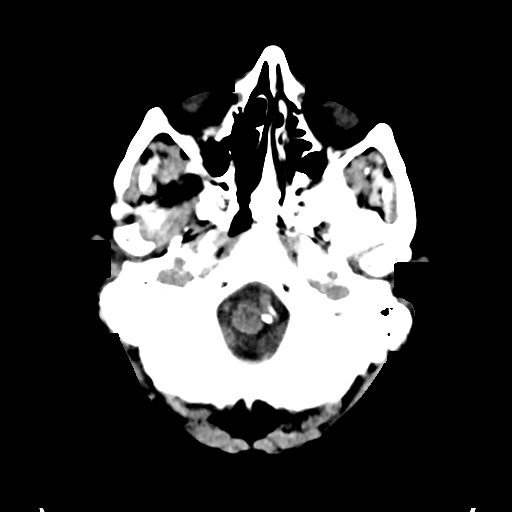
[im 4/32  bone]
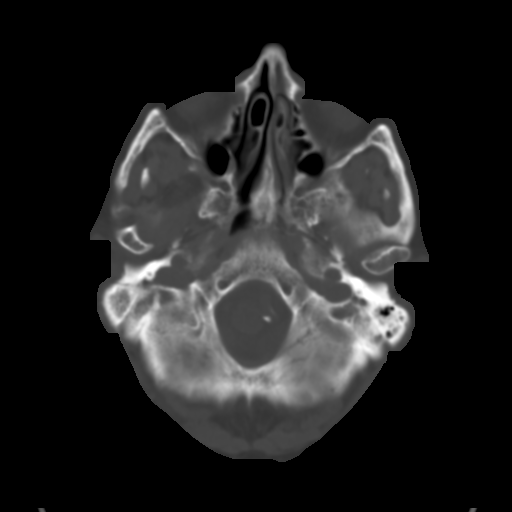
[im 8/32  brain]
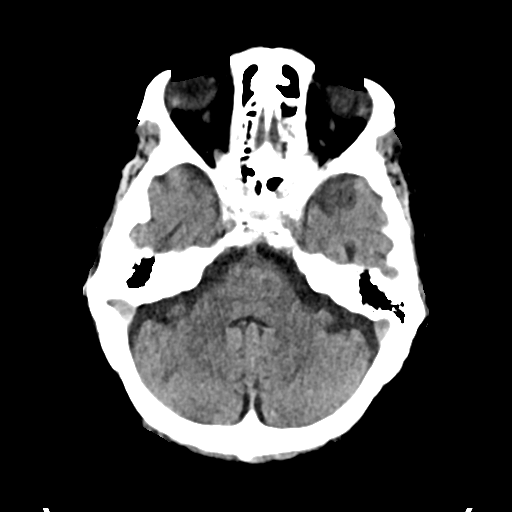
[im 12/32  brain]
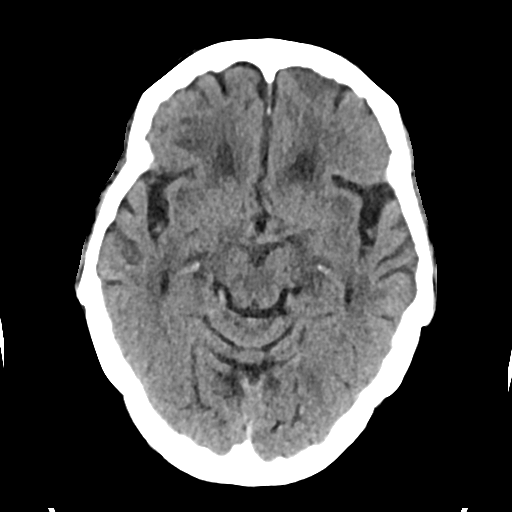
[im 16/32  brain]
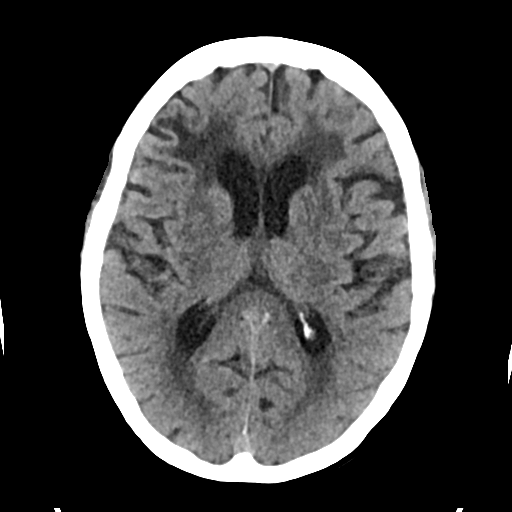
[im 20/32  brain]
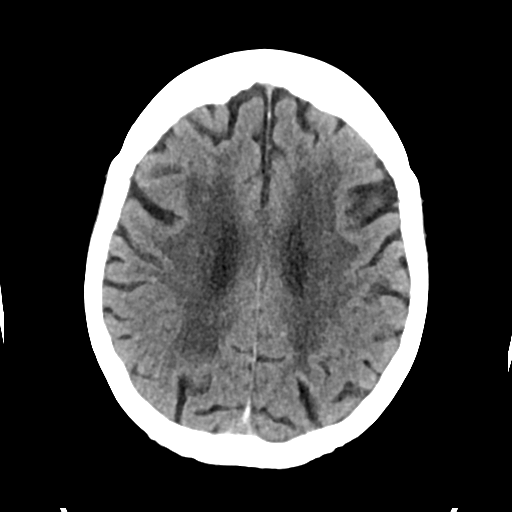
[im 20/32  bone]
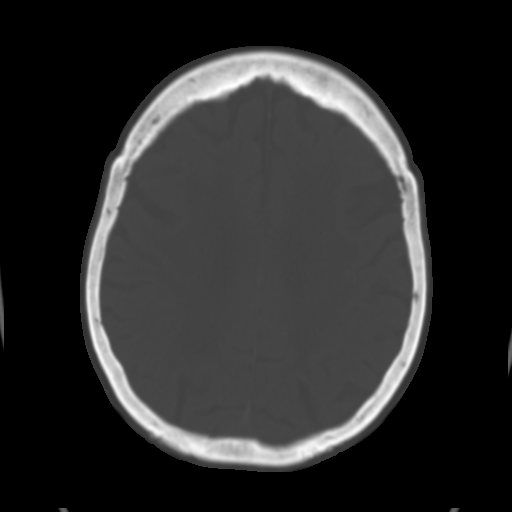
[im 24/32  brain]
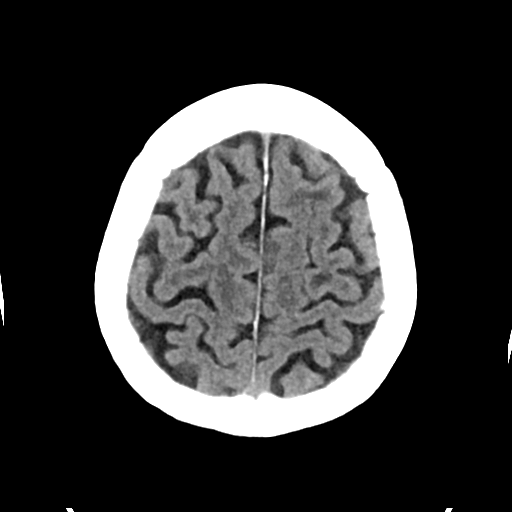
[im 28/32  brain]
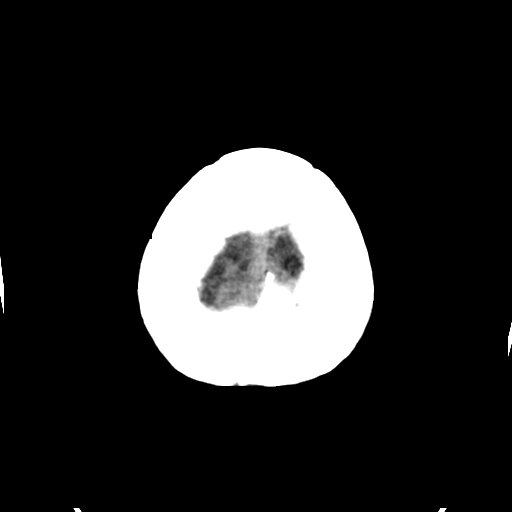

[Series 4: head bone · axial · 0.42mm/px · z∈[-158,-126]mm · 3 of 80 slices shown]
[im 8/80  bone]
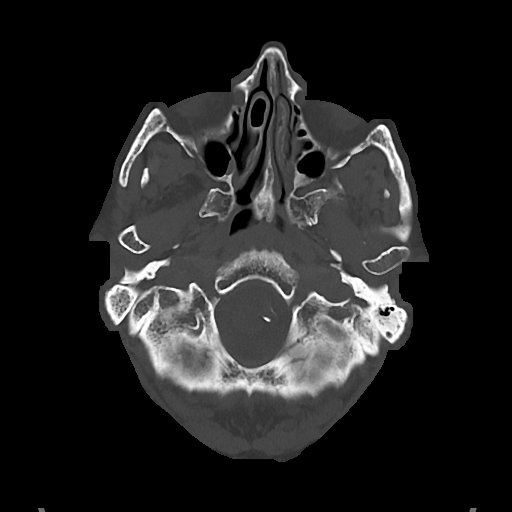
[im 16/80  bone]
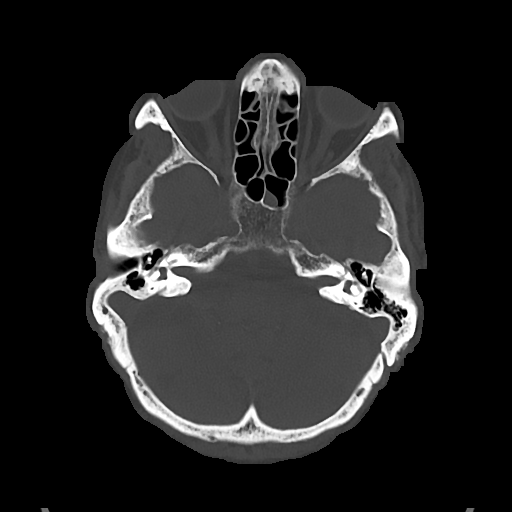
[im 24/80  bone]
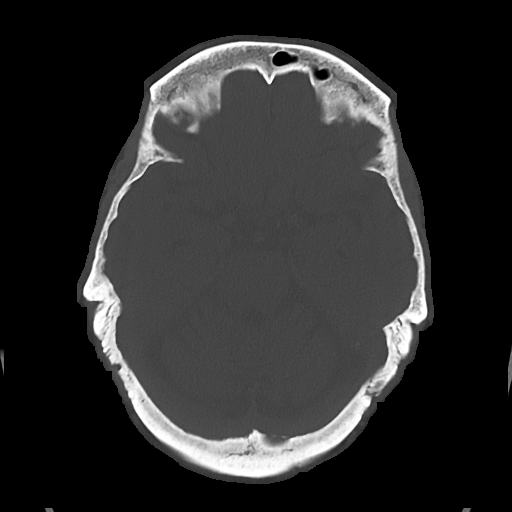

[Series 5: cor soft · coronal · 0.31mm/px · 3 of 68 slices shown]
[im 23/68  brain]
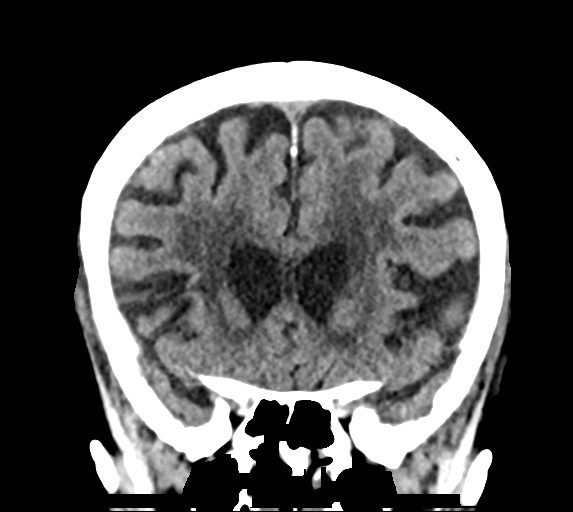
[im 30/68  brain]
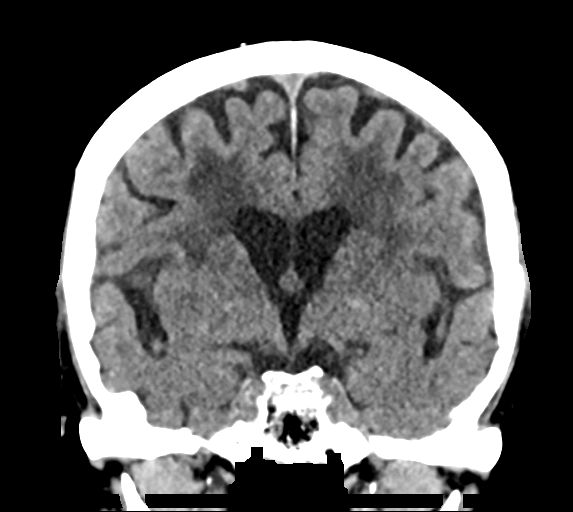
[im 38/68  brain]
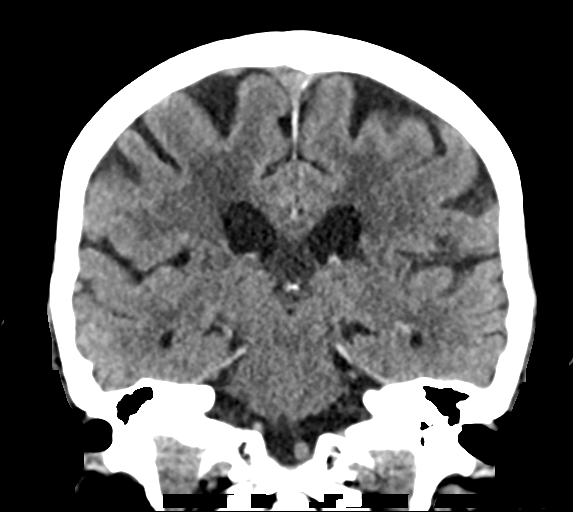

[Series 6: sag soft · sagittal · 0.31mm/px · 3 of 54 slices shown]
[im 18/54  brain]
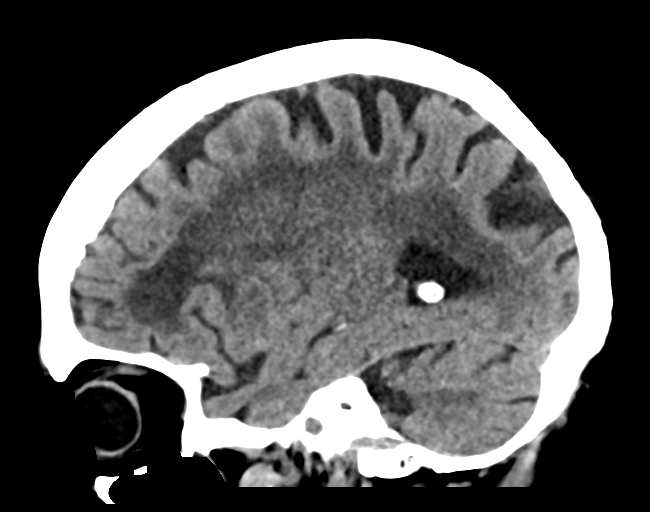
[im 27/54  brain]
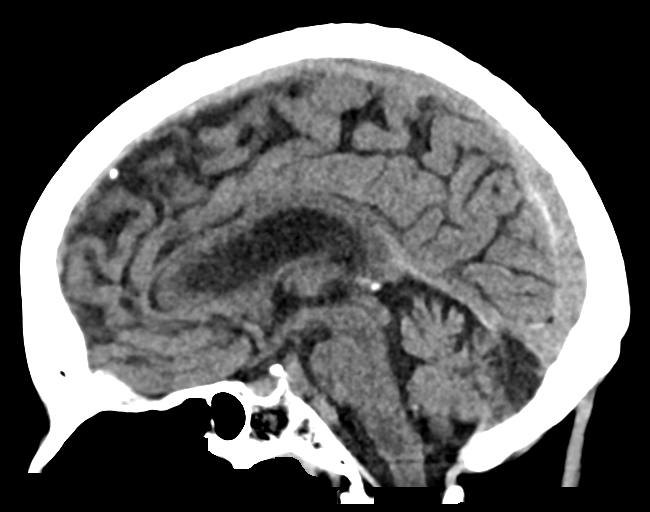
[im 36/54  brain]
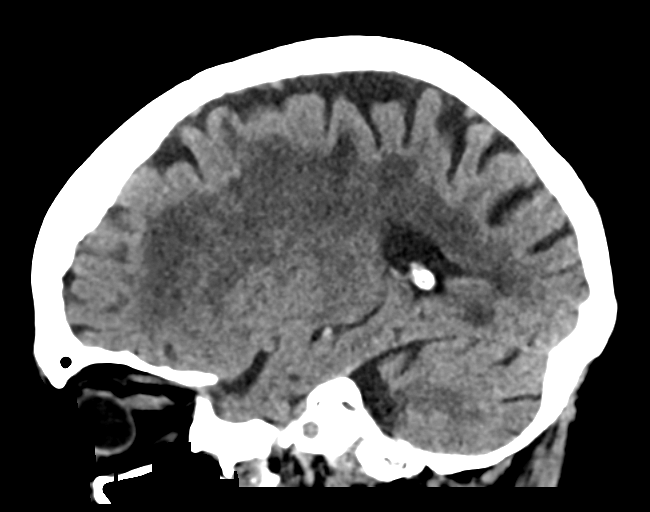

[16 of 47 positions shown; findings below may reference images not displayed]

FINDINGS: Brain: Diffuse cerebral atrophy. Mild ventricular dilatation
consistent with central atrophy. Low-attenuation changes throughout
the deep white matter consistent with small vessel ischemic changes.
Focal areas of encephalomalacia extending to the cortical surface in
the frontal lobes bilaterally, greater on the right. These are
consistent with old infarcts and similar to previous study. No
mass-effect or midline shift. No abnormal extra-axial fluid
collections. Gray-white matter junctions are distinct. Basal
cisterns are not effaced. No acute intracranial hemorrhage.

Vascular: Intracranial arterial vascular calcifications are present.

Skull: Calvarium appears intact.

Sinuses/Orbits: Mild mucosal thickening in the paranasal sinuses. No
acute air-fluid levels. Mastoid air cells are clear.

Other: None.
IMPRESSION: No acute intracranial abnormalities. Chronic atrophy and small
vessel ischemic changes. Old infarcts in the frontal lobes
bilaterally.

## 2019-02-22 IMAGING — CT CT ABD-PELV W/O CM
2 of 4 series · 16 of 46 positions shown, 18 images · non-contrast
Comparison: CT pelvis 06/15/2017

CLINICAL DATA: Bilateral leg pain. Burning with urination. Fall a
couple of months ago.

EXAM:
CT ABDOMEN AND PELVIS WITHOUT CONTRAST
TECHNIQUE: Multidetector CT imaging of the abdomen and pelvis was performed
following the standard protocol without IV contrast.

[Series 3: ap without · axial · non-contrast · 0.76mm/px · z∈[-821,-406]mm · 13 of 93 slices shown, 15 images]
[im 5/93  soft-tissue]
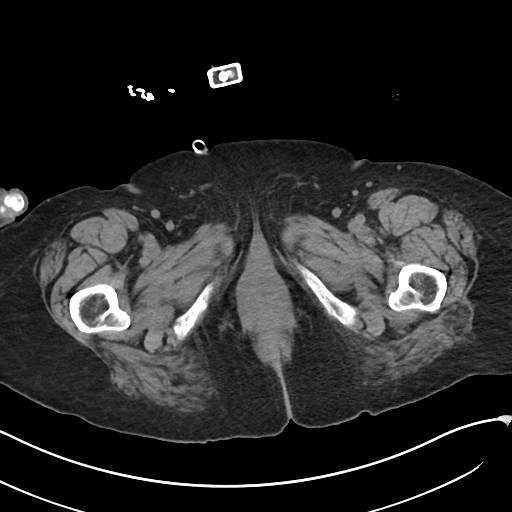
[im 5/93  bone]
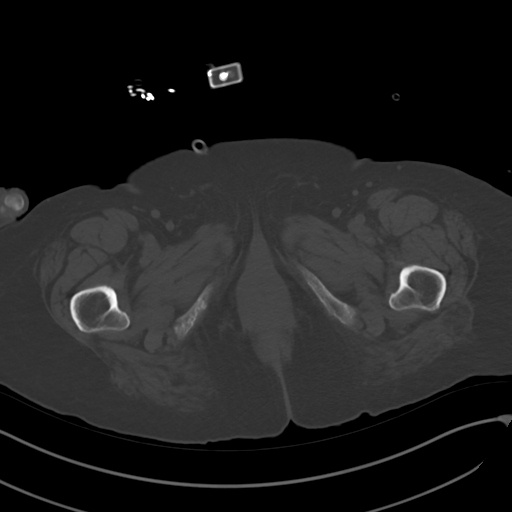
[im 14/93  soft-tissue]
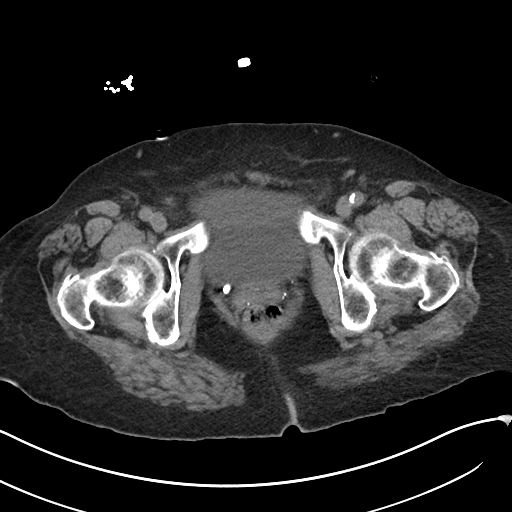
[im 18/93  soft-tissue]
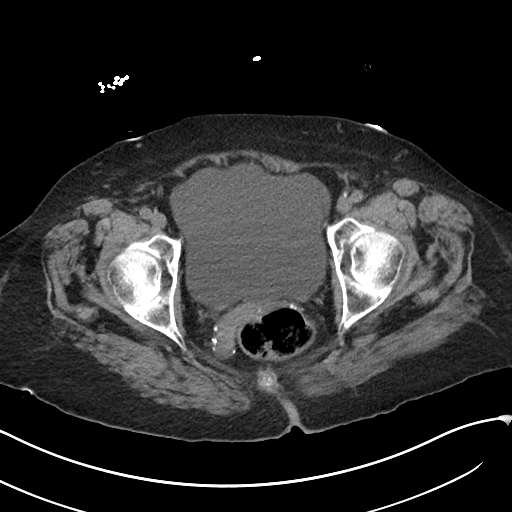
[im 27/93  soft-tissue]
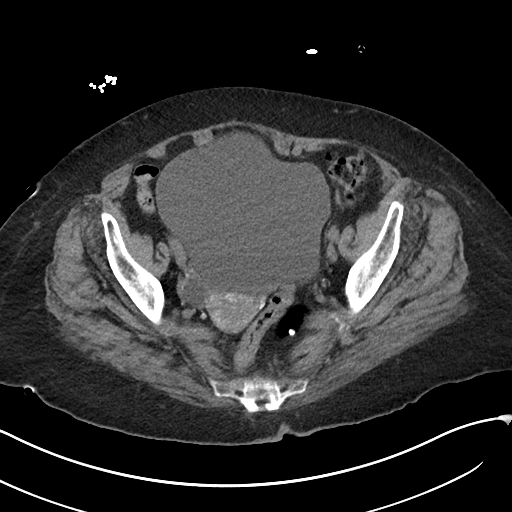
[im 31/93  soft-tissue]
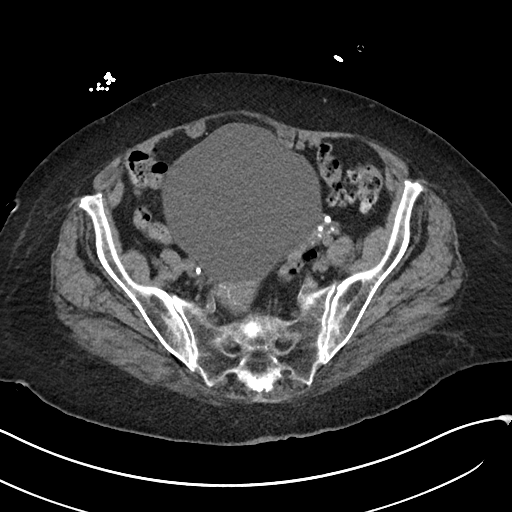
[im 40/93  soft-tissue]
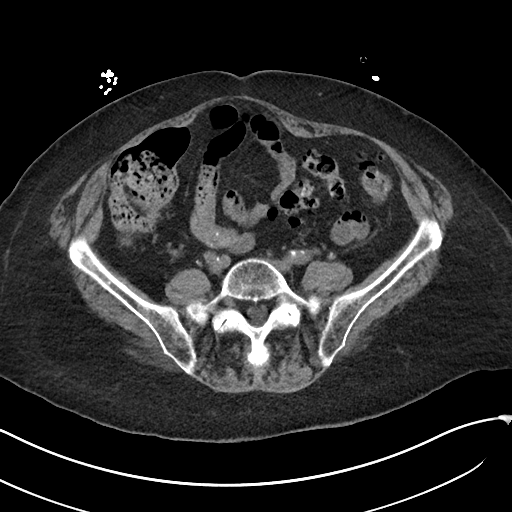
[im 49/93  soft-tissue]
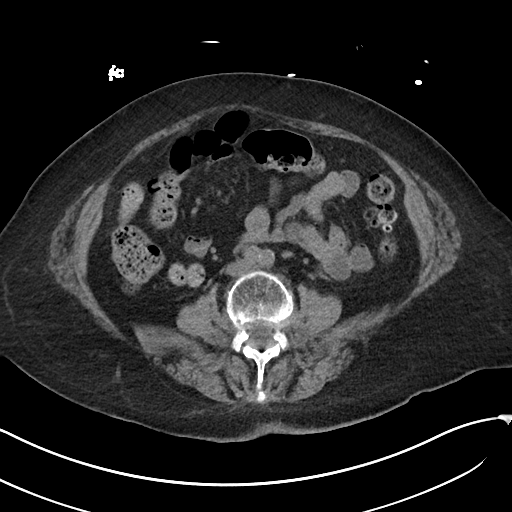
[im 53/93  soft-tissue]
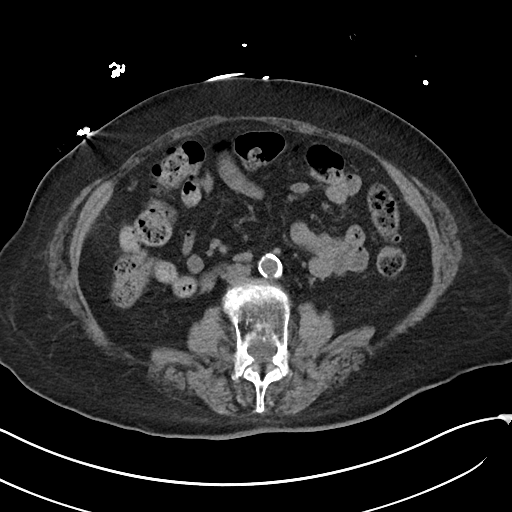
[im 62/93  soft-tissue]
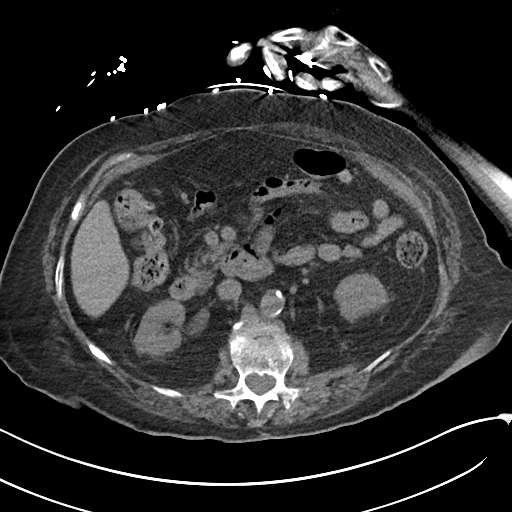
[im 62/93  bone]
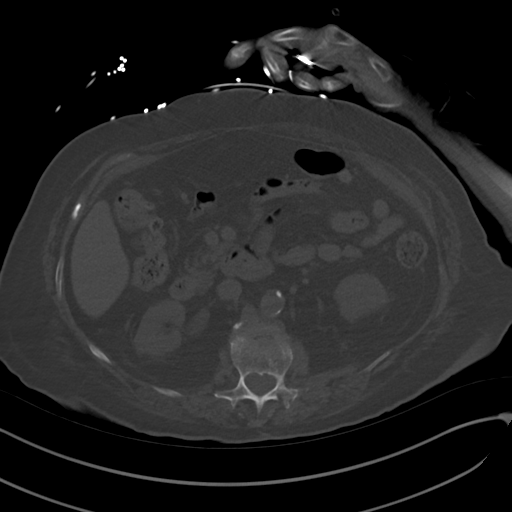
[im 66/93  soft-tissue]
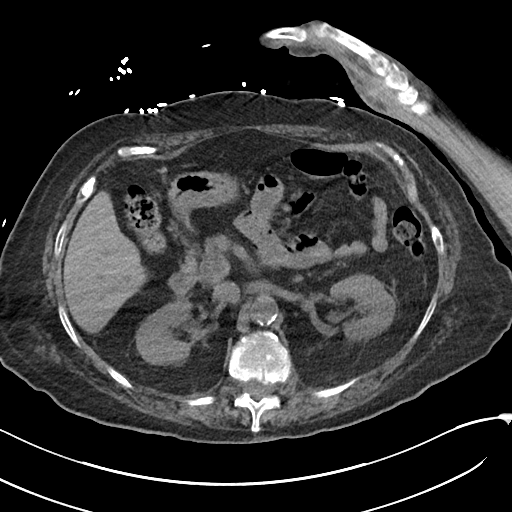
[im 75/93  soft-tissue]
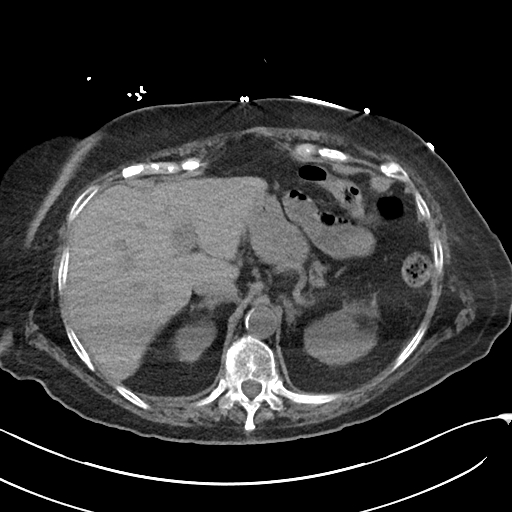
[im 79/93  soft-tissue]
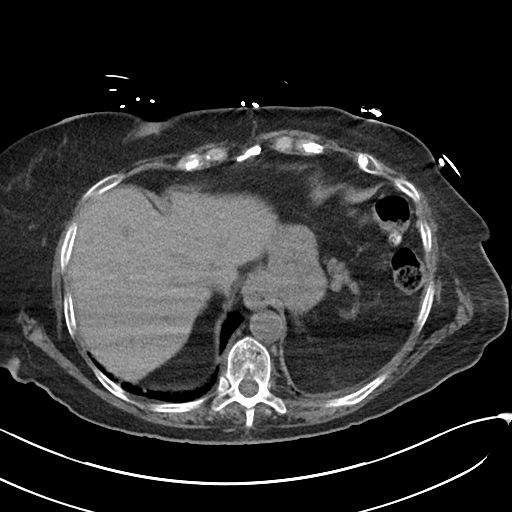
[im 88/93  soft-tissue]
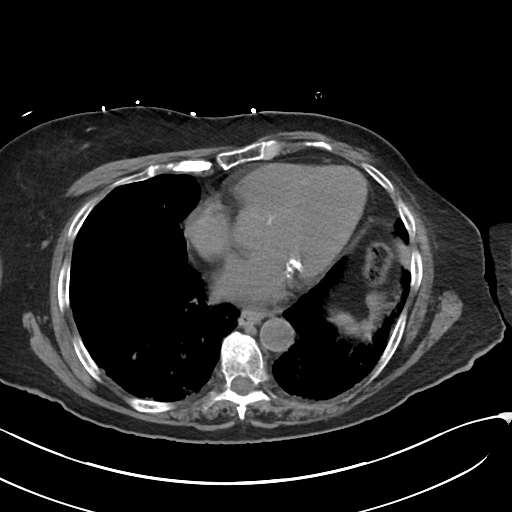

[Series 6: cor · coronal · 0.75mm/px · 3 of 92 slices shown]
[im 31/92  soft-tissue]
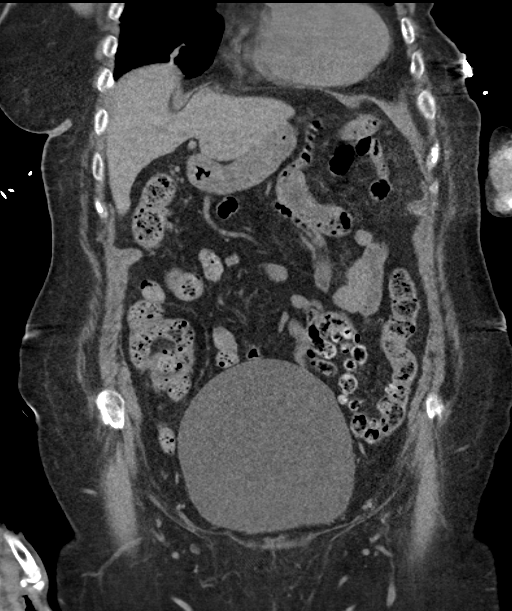
[im 41/92  soft-tissue]
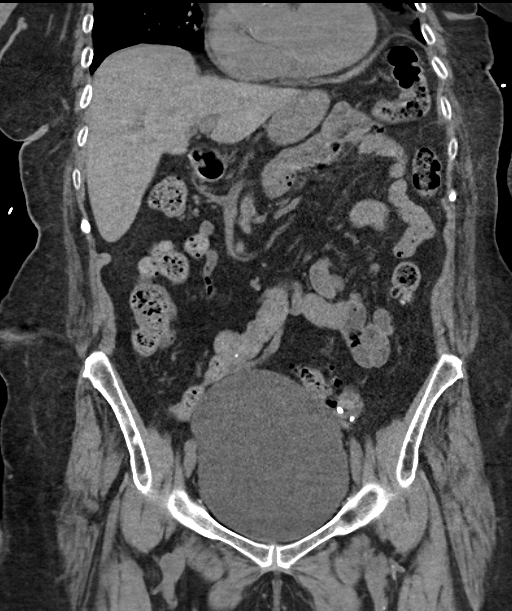
[im 51/92  soft-tissue]
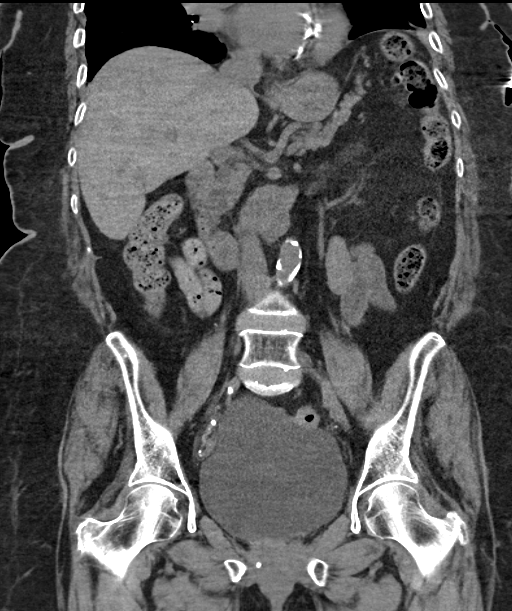

[16 of 46 positions shown; findings below may reference images not displayed]

FINDINGS: Lower chest: Emphysematous changes and scarring in the lung bases.
Coronary artery calcifications. Small esophageal hiatal hernia.

Hepatobiliary: Surgical absence of the gallbladder. No focal liver
lesions. No bile duct dilatation.

Pancreas: Unremarkable. No pancreatic ductal dilatation or
surrounding inflammatory changes.

Spleen: Normal in size without focal abnormality.

Adrenals/Urinary Tract: Adrenal glands are unremarkable. Kidneys are
normal, without renal calculi, focal lesion, or hydronephrosis.
Bladder is unremarkable.

Stomach/Bowel: Stomach is within normal limits. Appendix appears
surgically absent. No evidence of bowel wall thickening, distention,
or inflammatory changes.

Vascular/Lymphatic: Aortic atherosclerosis. No enlarged abdominal or
pelvic lymph nodes.

Reproductive: Uterus and bilateral adnexa are unremarkable.

Other: No abdominal wall hernia or abnormality. No abdominopelvic
ascites.

Musculoskeletal: Acute appearing anterior compression of L2 vertebra
with about 60% loss of height. Chronic appearing compression
fractures of T8, superior endplate of T10, T12, and L1. Multiple
compression fractures likely indicate osteoporosis.
IMPRESSION: 1. No acute process demonstrated in the abdomen or pelvis. No
evidence of bowel obstruction or inflammation.
2. Acute appearing anterior compression of L2 vertebra with about
60% loss of height.
3. Small esophageal hiatal hernia.
4. Emphysematous changes and scarring in the lung bases.

Aortic Atherosclerosis ([id number]-XPE.E).

## 2019-03-23 DIAGNOSIS — Z23 Encounter for immunization: Secondary | ICD-10-CM | POA: Diagnosis not present

## 2019-04-14 DIAGNOSIS — Z20828 Contact with and (suspected) exposure to other viral communicable diseases: Secondary | ICD-10-CM | POA: Diagnosis not present

## 2019-04-22 DIAGNOSIS — Z20828 Contact with and (suspected) exposure to other viral communicable diseases: Secondary | ICD-10-CM | POA: Diagnosis not present

## 2019-05-10 DIAGNOSIS — Z20828 Contact with and (suspected) exposure to other viral communicable diseases: Secondary | ICD-10-CM | POA: Diagnosis not present

## 2019-05-11 DIAGNOSIS — Z20828 Contact with and (suspected) exposure to other viral communicable diseases: Secondary | ICD-10-CM | POA: Diagnosis not present

## 2019-06-01 DIAGNOSIS — Z0000 Encounter for general adult medical examination without abnormal findings: Secondary | ICD-10-CM | POA: Diagnosis not present

## 2019-06-01 DIAGNOSIS — E7800 Pure hypercholesterolemia, unspecified: Secondary | ICD-10-CM | POA: Diagnosis not present

## 2019-06-01 DIAGNOSIS — I700 Atherosclerosis of aorta: Secondary | ICD-10-CM | POA: Diagnosis not present

## 2019-06-01 DIAGNOSIS — K219 Gastro-esophageal reflux disease without esophagitis: Secondary | ICD-10-CM | POA: Diagnosis not present

## 2019-06-01 DIAGNOSIS — G2581 Restless legs syndrome: Secondary | ICD-10-CM | POA: Diagnosis not present

## 2019-06-01 DIAGNOSIS — M810 Age-related osteoporosis without current pathological fracture: Secondary | ICD-10-CM | POA: Diagnosis not present

## 2019-06-01 DIAGNOSIS — E1142 Type 2 diabetes mellitus with diabetic polyneuropathy: Secondary | ICD-10-CM | POA: Diagnosis not present

## 2019-06-01 DIAGNOSIS — F0390 Unspecified dementia without behavioral disturbance: Secondary | ICD-10-CM | POA: Diagnosis not present

## 2019-06-01 DIAGNOSIS — I10 Essential (primary) hypertension: Secondary | ICD-10-CM | POA: Diagnosis not present

## 2019-06-01 DIAGNOSIS — D649 Anemia, unspecified: Secondary | ICD-10-CM | POA: Diagnosis not present

## 2019-06-01 DIAGNOSIS — J449 Chronic obstructive pulmonary disease, unspecified: Secondary | ICD-10-CM | POA: Diagnosis not present

## 2019-06-01 DIAGNOSIS — J309 Allergic rhinitis, unspecified: Secondary | ICD-10-CM | POA: Diagnosis not present

## 2019-06-03 DIAGNOSIS — Z20828 Contact with and (suspected) exposure to other viral communicable diseases: Secondary | ICD-10-CM | POA: Diagnosis not present

## 2019-06-15 DIAGNOSIS — D649 Anemia, unspecified: Secondary | ICD-10-CM | POA: Diagnosis not present

## 2019-06-15 DIAGNOSIS — J449 Chronic obstructive pulmonary disease, unspecified: Secondary | ICD-10-CM | POA: Diagnosis not present

## 2019-06-15 DIAGNOSIS — I10 Essential (primary) hypertension: Secondary | ICD-10-CM | POA: Diagnosis not present

## 2019-06-15 DIAGNOSIS — F0390 Unspecified dementia without behavioral disturbance: Secondary | ICD-10-CM | POA: Diagnosis not present

## 2019-06-15 DIAGNOSIS — J439 Emphysema, unspecified: Secondary | ICD-10-CM | POA: Diagnosis not present

## 2019-06-15 DIAGNOSIS — E7800 Pure hypercholesterolemia, unspecified: Secondary | ICD-10-CM | POA: Diagnosis not present

## 2019-06-15 DIAGNOSIS — M810 Age-related osteoporosis without current pathological fracture: Secondary | ICD-10-CM | POA: Diagnosis not present

## 2019-06-15 DIAGNOSIS — E1142 Type 2 diabetes mellitus with diabetic polyneuropathy: Secondary | ICD-10-CM | POA: Diagnosis not present

## 2019-06-15 DIAGNOSIS — Z7984 Long term (current) use of oral hypoglycemic drugs: Secondary | ICD-10-CM | POA: Diagnosis not present

## 2019-06-18 DIAGNOSIS — Z20828 Contact with and (suspected) exposure to other viral communicable diseases: Secondary | ICD-10-CM | POA: Diagnosis not present

## 2019-06-22 DIAGNOSIS — R609 Edema, unspecified: Secondary | ICD-10-CM | POA: Diagnosis not present

## 2019-06-22 DIAGNOSIS — R601 Generalized edema: Secondary | ICD-10-CM | POA: Diagnosis not present

## 2019-06-22 DIAGNOSIS — E1142 Type 2 diabetes mellitus with diabetic polyneuropathy: Secondary | ICD-10-CM | POA: Diagnosis not present

## 2019-06-24 DIAGNOSIS — I10 Essential (primary) hypertension: Secondary | ICD-10-CM | POA: Diagnosis not present

## 2019-06-24 DIAGNOSIS — E119 Type 2 diabetes mellitus without complications: Secondary | ICD-10-CM | POA: Diagnosis not present

## 2019-06-24 DIAGNOSIS — E139 Other specified diabetes mellitus without complications: Secondary | ICD-10-CM | POA: Diagnosis not present

## 2019-06-25 DIAGNOSIS — E119 Type 2 diabetes mellitus without complications: Secondary | ICD-10-CM | POA: Diagnosis not present

## 2019-06-25 DIAGNOSIS — D649 Anemia, unspecified: Secondary | ICD-10-CM | POA: Diagnosis not present

## 2019-06-25 DIAGNOSIS — I10 Essential (primary) hypertension: Secondary | ICD-10-CM | POA: Diagnosis not present

## 2019-06-29 DIAGNOSIS — J449 Chronic obstructive pulmonary disease, unspecified: Secondary | ICD-10-CM | POA: Diagnosis not present

## 2019-06-29 DIAGNOSIS — M810 Age-related osteoporosis without current pathological fracture: Secondary | ICD-10-CM | POA: Diagnosis not present

## 2019-06-29 DIAGNOSIS — I10 Essential (primary) hypertension: Secondary | ICD-10-CM | POA: Diagnosis not present

## 2019-06-29 DIAGNOSIS — F0151 Vascular dementia with behavioral disturbance: Secondary | ICD-10-CM | POA: Diagnosis not present

## 2019-07-04 DIAGNOSIS — F4320 Adjustment disorder, unspecified: Secondary | ICD-10-CM | POA: Diagnosis not present

## 2019-07-06 DIAGNOSIS — E139 Other specified diabetes mellitus without complications: Secondary | ICD-10-CM | POA: Diagnosis not present

## 2019-07-06 DIAGNOSIS — D649 Anemia, unspecified: Secondary | ICD-10-CM | POA: Diagnosis not present

## 2019-07-07 DIAGNOSIS — R531 Weakness: Secondary | ICD-10-CM | POA: Diagnosis not present

## 2019-07-07 DIAGNOSIS — E785 Hyperlipidemia, unspecified: Secondary | ICD-10-CM | POA: Diagnosis not present

## 2019-07-07 DIAGNOSIS — F0151 Vascular dementia with behavioral disturbance: Secondary | ICD-10-CM | POA: Diagnosis not present

## 2019-07-07 DIAGNOSIS — J449 Chronic obstructive pulmonary disease, unspecified: Secondary | ICD-10-CM | POA: Diagnosis not present

## 2019-07-08 DIAGNOSIS — I10 Essential (primary) hypertension: Secondary | ICD-10-CM | POA: Diagnosis not present

## 2019-07-08 DIAGNOSIS — R6889 Other general symptoms and signs: Secondary | ICD-10-CM | POA: Diagnosis not present

## 2019-07-08 DIAGNOSIS — D649 Anemia, unspecified: Secondary | ICD-10-CM | POA: Diagnosis not present

## 2019-07-08 DIAGNOSIS — E139 Other specified diabetes mellitus without complications: Secondary | ICD-10-CM | POA: Diagnosis not present

## 2019-07-13 DIAGNOSIS — G8929 Other chronic pain: Secondary | ICD-10-CM | POA: Diagnosis not present

## 2019-07-13 DIAGNOSIS — J189 Pneumonia, unspecified organism: Secondary | ICD-10-CM | POA: Diagnosis not present

## 2019-07-13 DIAGNOSIS — J449 Chronic obstructive pulmonary disease, unspecified: Secondary | ICD-10-CM | POA: Diagnosis not present

## 2019-07-13 DIAGNOSIS — M810 Age-related osteoporosis without current pathological fracture: Secondary | ICD-10-CM | POA: Diagnosis not present

## 2019-07-20 DIAGNOSIS — I10 Essential (primary) hypertension: Secondary | ICD-10-CM | POA: Diagnosis not present

## 2019-07-20 DIAGNOSIS — K219 Gastro-esophageal reflux disease without esophagitis: Secondary | ICD-10-CM | POA: Diagnosis not present

## 2019-07-20 DIAGNOSIS — J449 Chronic obstructive pulmonary disease, unspecified: Secondary | ICD-10-CM | POA: Diagnosis not present

## 2019-07-20 DIAGNOSIS — J189 Pneumonia, unspecified organism: Secondary | ICD-10-CM | POA: Diagnosis not present

## 2019-08-01 DIAGNOSIS — R0602 Shortness of breath: Secondary | ICD-10-CM | POA: Diagnosis not present

## 2019-08-01 DIAGNOSIS — J9610 Chronic respiratory failure, unspecified whether with hypoxia or hypercapnia: Secondary | ICD-10-CM | POA: Diagnosis not present

## 2019-08-01 DIAGNOSIS — E785 Hyperlipidemia, unspecified: Secondary | ICD-10-CM | POA: Diagnosis not present

## 2019-08-01 DIAGNOSIS — M1990 Unspecified osteoarthritis, unspecified site: Secondary | ICD-10-CM | POA: Diagnosis not present

## 2019-08-01 DIAGNOSIS — G8929 Other chronic pain: Secondary | ICD-10-CM | POA: Diagnosis not present

## 2019-08-02 DIAGNOSIS — J189 Pneumonia, unspecified organism: Secondary | ICD-10-CM | POA: Diagnosis not present

## 2019-08-04 DIAGNOSIS — J449 Chronic obstructive pulmonary disease, unspecified: Secondary | ICD-10-CM | POA: Diagnosis not present

## 2019-08-04 DIAGNOSIS — E119 Type 2 diabetes mellitus without complications: Secondary | ICD-10-CM | POA: Diagnosis not present

## 2019-08-04 DIAGNOSIS — F0151 Vascular dementia with behavioral disturbance: Secondary | ICD-10-CM | POA: Diagnosis not present

## 2019-08-04 DIAGNOSIS — I10 Essential (primary) hypertension: Secondary | ICD-10-CM | POA: Diagnosis not present

## 2019-08-09 DIAGNOSIS — J449 Chronic obstructive pulmonary disease, unspecified: Secondary | ICD-10-CM | POA: Diagnosis not present

## 2019-08-09 DIAGNOSIS — R41841 Cognitive communication deficit: Secondary | ICD-10-CM | POA: Diagnosis not present

## 2019-08-09 DIAGNOSIS — R1312 Dysphagia, oropharyngeal phase: Secondary | ICD-10-CM | POA: Diagnosis not present

## 2019-08-11 DIAGNOSIS — R1312 Dysphagia, oropharyngeal phase: Secondary | ICD-10-CM | POA: Diagnosis not present

## 2019-08-11 DIAGNOSIS — R41841 Cognitive communication deficit: Secondary | ICD-10-CM | POA: Diagnosis not present

## 2019-08-11 DIAGNOSIS — J449 Chronic obstructive pulmonary disease, unspecified: Secondary | ICD-10-CM | POA: Diagnosis not present

## 2019-08-12 DIAGNOSIS — R41841 Cognitive communication deficit: Secondary | ICD-10-CM | POA: Diagnosis not present

## 2019-08-12 DIAGNOSIS — R1312 Dysphagia, oropharyngeal phase: Secondary | ICD-10-CM | POA: Diagnosis not present

## 2019-08-12 DIAGNOSIS — J449 Chronic obstructive pulmonary disease, unspecified: Secondary | ICD-10-CM | POA: Diagnosis not present

## 2019-08-13 DIAGNOSIS — R41841 Cognitive communication deficit: Secondary | ICD-10-CM | POA: Diagnosis not present

## 2019-08-13 DIAGNOSIS — R278 Other lack of coordination: Secondary | ICD-10-CM | POA: Diagnosis not present

## 2019-08-14 DIAGNOSIS — R278 Other lack of coordination: Secondary | ICD-10-CM | POA: Diagnosis not present

## 2019-08-14 DIAGNOSIS — R41841 Cognitive communication deficit: Secondary | ICD-10-CM | POA: Diagnosis not present

## 2019-08-16 DIAGNOSIS — F4320 Adjustment disorder, unspecified: Secondary | ICD-10-CM | POA: Diagnosis not present

## 2019-08-17 DIAGNOSIS — R41841 Cognitive communication deficit: Secondary | ICD-10-CM | POA: Diagnosis not present

## 2019-08-17 DIAGNOSIS — R278 Other lack of coordination: Secondary | ICD-10-CM | POA: Diagnosis not present

## 2019-08-18 DIAGNOSIS — R41841 Cognitive communication deficit: Secondary | ICD-10-CM | POA: Diagnosis not present

## 2019-08-18 DIAGNOSIS — R278 Other lack of coordination: Secondary | ICD-10-CM | POA: Diagnosis not present

## 2019-08-19 DIAGNOSIS — R41841 Cognitive communication deficit: Secondary | ICD-10-CM | POA: Diagnosis not present

## 2019-08-19 DIAGNOSIS — R278 Other lack of coordination: Secondary | ICD-10-CM | POA: Diagnosis not present

## 2019-08-20 DIAGNOSIS — R41841 Cognitive communication deficit: Secondary | ICD-10-CM | POA: Diagnosis not present

## 2019-08-20 DIAGNOSIS — R278 Other lack of coordination: Secondary | ICD-10-CM | POA: Diagnosis not present

## 2019-08-22 DIAGNOSIS — R41841 Cognitive communication deficit: Secondary | ICD-10-CM | POA: Diagnosis not present

## 2019-08-22 DIAGNOSIS — R278 Other lack of coordination: Secondary | ICD-10-CM | POA: Diagnosis not present

## 2019-08-23 DIAGNOSIS — R41841 Cognitive communication deficit: Secondary | ICD-10-CM | POA: Diagnosis not present

## 2019-08-23 DIAGNOSIS — R278 Other lack of coordination: Secondary | ICD-10-CM | POA: Diagnosis not present

## 2019-08-24 DIAGNOSIS — R41841 Cognitive communication deficit: Secondary | ICD-10-CM | POA: Diagnosis not present

## 2019-08-24 DIAGNOSIS — R278 Other lack of coordination: Secondary | ICD-10-CM | POA: Diagnosis not present

## 2019-08-25 DIAGNOSIS — R41841 Cognitive communication deficit: Secondary | ICD-10-CM | POA: Diagnosis not present

## 2019-08-25 DIAGNOSIS — R278 Other lack of coordination: Secondary | ICD-10-CM | POA: Diagnosis not present

## 2019-08-26 DIAGNOSIS — R278 Other lack of coordination: Secondary | ICD-10-CM | POA: Diagnosis not present

## 2019-08-26 DIAGNOSIS — R41841 Cognitive communication deficit: Secondary | ICD-10-CM | POA: Diagnosis not present

## 2019-08-27 DIAGNOSIS — R278 Other lack of coordination: Secondary | ICD-10-CM | POA: Diagnosis not present

## 2019-08-27 DIAGNOSIS — R531 Weakness: Secondary | ICD-10-CM | POA: Diagnosis not present

## 2019-08-27 DIAGNOSIS — R296 Repeated falls: Secondary | ICD-10-CM | POA: Diagnosis not present

## 2019-08-27 DIAGNOSIS — R41841 Cognitive communication deficit: Secondary | ICD-10-CM | POA: Diagnosis not present

## 2019-08-29 DIAGNOSIS — J9610 Chronic respiratory failure, unspecified whether with hypoxia or hypercapnia: Secondary | ICD-10-CM | POA: Diagnosis not present

## 2019-08-29 DIAGNOSIS — R41841 Cognitive communication deficit: Secondary | ICD-10-CM | POA: Diagnosis not present

## 2019-08-29 DIAGNOSIS — K219 Gastro-esophageal reflux disease without esophagitis: Secondary | ICD-10-CM | POA: Diagnosis not present

## 2019-08-29 DIAGNOSIS — M25552 Pain in left hip: Secondary | ICD-10-CM | POA: Diagnosis not present

## 2019-08-29 DIAGNOSIS — F4320 Adjustment disorder, unspecified: Secondary | ICD-10-CM | POA: Diagnosis not present

## 2019-08-29 DIAGNOSIS — R278 Other lack of coordination: Secondary | ICD-10-CM | POA: Diagnosis not present

## 2019-08-30 DIAGNOSIS — R278 Other lack of coordination: Secondary | ICD-10-CM | POA: Diagnosis not present

## 2019-08-30 DIAGNOSIS — R41841 Cognitive communication deficit: Secondary | ICD-10-CM | POA: Diagnosis not present

## 2019-08-31 DIAGNOSIS — R278 Other lack of coordination: Secondary | ICD-10-CM | POA: Diagnosis not present

## 2019-08-31 DIAGNOSIS — R41841 Cognitive communication deficit: Secondary | ICD-10-CM | POA: Diagnosis not present

## 2019-09-01 DIAGNOSIS — I10 Essential (primary) hypertension: Secondary | ICD-10-CM | POA: Diagnosis not present

## 2019-09-01 DIAGNOSIS — R41841 Cognitive communication deficit: Secondary | ICD-10-CM | POA: Diagnosis not present

## 2019-09-01 DIAGNOSIS — R278 Other lack of coordination: Secondary | ICD-10-CM | POA: Diagnosis not present

## 2019-09-01 DIAGNOSIS — K219 Gastro-esophageal reflux disease without esophagitis: Secondary | ICD-10-CM | POA: Diagnosis not present

## 2019-09-01 DIAGNOSIS — J449 Chronic obstructive pulmonary disease, unspecified: Secondary | ICD-10-CM | POA: Diagnosis not present

## 2019-09-01 DIAGNOSIS — F0151 Vascular dementia with behavioral disturbance: Secondary | ICD-10-CM | POA: Diagnosis not present

## 2019-09-02 DIAGNOSIS — R278 Other lack of coordination: Secondary | ICD-10-CM | POA: Diagnosis not present

## 2019-09-02 DIAGNOSIS — R41841 Cognitive communication deficit: Secondary | ICD-10-CM | POA: Diagnosis not present

## 2019-09-04 DIAGNOSIS — R062 Wheezing: Secondary | ICD-10-CM | POA: Diagnosis not present

## 2019-09-05 DIAGNOSIS — R278 Other lack of coordination: Secondary | ICD-10-CM | POA: Diagnosis not present

## 2019-09-05 DIAGNOSIS — R41841 Cognitive communication deficit: Secondary | ICD-10-CM | POA: Diagnosis not present

## 2019-09-06 DIAGNOSIS — R41841 Cognitive communication deficit: Secondary | ICD-10-CM | POA: Diagnosis not present

## 2019-09-06 DIAGNOSIS — R278 Other lack of coordination: Secondary | ICD-10-CM | POA: Diagnosis not present

## 2019-09-07 DIAGNOSIS — R41841 Cognitive communication deficit: Secondary | ICD-10-CM | POA: Diagnosis not present

## 2019-09-07 DIAGNOSIS — R278 Other lack of coordination: Secondary | ICD-10-CM | POA: Diagnosis not present

## 2019-09-08 DIAGNOSIS — R41841 Cognitive communication deficit: Secondary | ICD-10-CM | POA: Diagnosis not present

## 2019-09-08 DIAGNOSIS — R278 Other lack of coordination: Secondary | ICD-10-CM | POA: Diagnosis not present

## 2019-09-09 DIAGNOSIS — R278 Other lack of coordination: Secondary | ICD-10-CM | POA: Diagnosis not present

## 2019-09-09 DIAGNOSIS — R41841 Cognitive communication deficit: Secondary | ICD-10-CM | POA: Diagnosis not present

## 2019-09-10 DIAGNOSIS — R41841 Cognitive communication deficit: Secondary | ICD-10-CM | POA: Diagnosis not present

## 2019-09-10 DIAGNOSIS — R278 Other lack of coordination: Secondary | ICD-10-CM | POA: Diagnosis not present

## 2019-09-12 DIAGNOSIS — R41841 Cognitive communication deficit: Secondary | ICD-10-CM | POA: Diagnosis not present

## 2019-09-12 DIAGNOSIS — R278 Other lack of coordination: Secondary | ICD-10-CM | POA: Diagnosis not present

## 2019-09-29 DIAGNOSIS — R601 Generalized edema: Secondary | ICD-10-CM | POA: Diagnosis not present

## 2019-10-06 ENCOUNTER — Telehealth: Payer: Self-pay | Admitting: Emergency Medicine

## 2019-10-06 ENCOUNTER — Other Ambulatory Visit: Payer: Self-pay

## 2019-10-06 ENCOUNTER — Ambulatory Visit (INDEPENDENT_AMBULATORY_CARE_PROVIDER_SITE_OTHER): Payer: Medicare Other

## 2019-10-06 ENCOUNTER — Ambulatory Visit (INDEPENDENT_AMBULATORY_CARE_PROVIDER_SITE_OTHER): Payer: Medicare Other | Admitting: Emergency Medicine

## 2019-10-06 ENCOUNTER — Encounter: Payer: Self-pay | Admitting: Emergency Medicine

## 2019-10-06 VITALS — BP 130/70 | HR 71 | Ht 60.0 in | Wt 196.4 lb

## 2019-10-06 DIAGNOSIS — J189 Pneumonia, unspecified organism: Secondary | ICD-10-CM

## 2019-10-06 DIAGNOSIS — J449 Chronic obstructive pulmonary disease, unspecified: Secondary | ICD-10-CM

## 2019-10-06 NOTE — Assessment & Plan Note (Signed)
She is having flaring symptoms, characterized by more chest mucus, dyspnea.  Unclear as to whether these are pneumonias versus COPD exacerbation and increased mucus production.  She has been treated with several courses of antibiotics.  Given the recurrence question whether she may be having aspiration.  We will order a modified barium swallow to evaluate further.  Reimage today to assess for infiltrates.  If the flares represent intermittent difficulty with secretion management then hopefully adding guaifenesin and flutter valve will be beneficial.   Please continue Symbicort 2 puffs twice a day.  Rinse and gargle after using. Please continue DuoNeb 4 times a day on a schedule. Chest x-ray today to evaluate for recurrent pneumonia We will perform a modified barium swallowing test to ensure no evidence of aspiration when you eat and drink Please start using a flutter valve 2 times a day every day Do your incentive spirometer 3 times a day, 10 breaths each time Please start guaifenesin 600 mg (generic Mucinex) twice a day Continue oxygen at 2 L/min Follow with Dr. Lamonte Sakai in 1 month to review your testing

## 2019-10-06 NOTE — Patient Instructions (Addendum)
Please continue Symbicort 2 puffs twice a day.  Rinse and gargle after using. Please continue DuoNeb 4 times a day on a schedule. Chest x-ray today to evaluate for recurrent pneumonia We will perform a modified barium swallowing test to ensure no evidence of aspiration when you eat and drink Please start using a flutter valve 2 times a day every day Do your incentive spirometer 3 times a day, 10 breaths each time Please start guaifenesin 600 mg (generic Mucinex) twice a day Continue oxygen at 2 L/min Follow with Dr. Lamonte Sakai in 1 month to review your testing

## 2019-10-06 NOTE — Telephone Encounter (Signed)
Patient was seen at office 10/06/19 for a consult with Dr. Lamonte Sakai. Patient had a chest xray performed after the visit. Results of the chest xray need to be faxed to [facility] where pt is a resident at.  Fax number to [facility] is: [phone]  Routing to myself to follow up on as I was working with Dr. Lamonte Sakai during the visit.

## 2019-10-06 NOTE — Progress Notes (Signed)
Subjective:    Patient ID: Candice Miller, female    DOB: 1924-10-03, 86 y.o.   MRN: [id number]  HPI 86 year old never smoker with a history of breast cancer, diabetes, hypertension on lisinopril, anemia.  She carries a diagnosis of COPD that was made years ago in [place].  Referred today for evaluation of recurrent flares that have been treated as PNA. She has had CXR's at [facility] that reportedly have shown LL infiltrates. She has been treated with abx x 3 since March. She is on O2  She reports that she is dyspneic with transferring, any ADL's - longstanding. When she is flaring, she has more mucous, "rattling" in her chest. She has also had desaturations, usually on 2L/min. She does feel that the nebs help her dyspnea. She denies any aspiration sx, choking  She is currently on Symbicort, DuoNeb which she uses 4 times a day on a schedule.   She is on Flonase, Allegra, Pepcid twice daily.  On lisinopril as above.   Echocardiogram 01/20/2018 reviewed, shows LVEF 93-57%, grade 1 diastolic dysfunction normal RV size and function, estimated PASP 19 mmHg  Chest x-ray 01/17/2018 reviewed by me shows bilateral interstitial prominence without focal infiltrate CT scan of the abdomen done 11/02/2017 reviewed, shows basilar emphysematous changes and scar with a small hiatal hernia CT chest 06/10/2017 >> pulm nodule   Review of Systems As per HPI  Past Medical History:  Diagnosis Date  . Anemia   . Breast cancer ([place]) 1994  . COPD (chronic obstructive pulmonary disease) ([place])   . Diabetes mellitus without complication ([place])    diet controlled vs. on glimepride  . Hypertension   . NPH (normal pressure hydrocephalus) ([place])   . Osteoporosis      Family History  Problem Relation Age of Onset  . Hypertension Mother   . Diabetes Mother   . Heart failure Mother   . Stroke Mother   . Hypertension Father   . Diabetes Father   . Heart failure Father   . Stroke Father      Social History     Socioeconomic History  . Marital status: Widowed    Spouse name: Not on file  . Number of children: Not on file  . Years of education: Not on file  . Highest education level: Not on file  Occupational History  . Occupation: retired  Tobacco Use  . Smoking status: Never Smoker  . Smokeless tobacco: Former Systems developer    Types: Snuff, Chew  . Tobacco comment: lived with smokers most of her life; oral tobacco for 10 years?  Vaping Use  . Vaping Use: Never used  Substance and Sexual Activity  . Alcohol use: No  . Drug use: No  . Sexual activity: Not on file  Other Topics Concern  . Not on file  Social History Narrative   Lives with daughter and son-in-law.  Widowed.   Social Determinants of Health   Financial Resource Strain:   . Difficulty of Paying Living Expenses:   Food Insecurity:   . Worried About Charity fundraiser in the Last Year:   . Arboriculturist in the Last Year:   Transportation Needs:   . Film/video editor (Medical):   Marland Kitchen Lack of Transportation (Non-Medical):   Physical Activity:   . Days of Exercise per Week:   . Minutes of Exercise per Session:   Stress:   . Feeling of Stress :   Social Connections:   . Frequency  of Communication with Friends and Family:   . Frequency of Social Gatherings with Friends and Family:   . Attends Religious Services:   . Active Member of Clubs or Organizations:   . Attends Archivist Meetings:   Marland Kitchen Marital Status:   Intimate Partner Violence:   . Fear of Current or Ex-Partner:   . Emotionally Abused:   Marland Kitchen Physically Abused:   . Sexually Abused:      Allergies  Allergen Reactions  . Adhesive [Tape] Other (See Comments)    SKIN IS FRAGILE AND WILL TEAR VERY EASILY!!!  . Macrodantin [Nitrofurantoin] Other (See Comments)    "Made me hurt all over"  . Sulfa Antibiotics Swelling    Swelling of the throat, but patient denies that it impaired her breathing  . Keflex [Cephalexin] Rash     Outpatient Medications  Prior to Visit  Medication Sig Dispense Refill  . acetaminophen (TYLENOL) 500 MG tablet Take 1,000 mg by mouth every 6 (six) hours.     Marland Kitchen albuterol (PROVENTIL HFA;VENTOLIN HFA) 108 (90 Base) MCG/ACT inhaler Inhale 2 puffs into the lungs every 6 (six) hours as needed for wheezing or shortness of breath.     Marland Kitchen atorvastatin (LIPITOR) 10 MG tablet Take 10 mg by mouth daily.    . budesonide-formoterol (SYMBICORT) 80-4.5 MCG/ACT inhaler Inhale 2 puffs into the lungs 2 (two) times daily.    . calcium carbonate (CALCIUM 600) 1500 (600 Ca) MG TABS tablet Take 1,200 mg by mouth daily.    . cholecalciferol (VITAMIN D) 1000 units tablet Take 1,000 Units by mouth daily.    Marland Kitchen diltiazem (CARDIZEM CD) 180 MG 24 hr capsule Take 180 mg by mouth daily.    . famotidine (PEPCID) 20 MG tablet Take 20 mg by mouth 2 (two) times daily.    . fexofenadine (ALLEGRA) 180 MG tablet Take 180 mg by mouth as needed for allergies.     . fluticasone (FLONASE) 50 MCG/ACT nasal spray Place 2 sprays into both nostrils daily.    . furosemide (LASIX) 40 MG tablet Take 40 mg by mouth daily as needed for fluid or edema.     . gabapentin (NEURONTIN) 300 MG capsule Take 300 mg by mouth at bedtime.     Marland Kitchen glimepiride (AMARYL) 1 MG tablet Take 1 mg by mouth See admin instructions. Take 1 mg by mouth once a day only if BGL is >150, per provider    . guaiFENesin (MUCINEX) 600 MG 12 hr tablet Take 600 mg by mouth daily as needed.    Marland Kitchen ipratropium-albuterol (DUONEB) 0.5-2.5 (3) MG/3ML SOLN Take 3 mLs by nebulization 4 (four) times daily.     . iron polysaccharides (NIFEREX) 150 MG capsule Take 150 mg by mouth 2 (two) times daily.     Marland Kitchen l-methylfolate-B6-B12 (METANX) 3-35-2 MG TABS tablet Take 1 tablet by mouth 2 (two) times daily.     . Lidocaine (ASPERCREME LIDOCAINE) 4 % PTCH Apply 1 patch topically 2 (two) times daily.     . Lidocaine-Glycerin (PREPARATION H) 5-14.4 % CREA Place 1 application rectally as needed (Constipation). Unsupervised   Self administration     . lisinopril (PRINIVIL,ZESTRIL) 10 MG tablet Take 10 mg by mouth daily.    . Magnesium 250 MG TABS Take 250 mg by mouth See admin instructions. Every 24 hours as needed for constipation "May take at bedtime PRN    . memantine (NAMENDA) 10 MG tablet Take 10 mg by mouth 2 (two) times daily.    Marland Kitchen  metFORMIN (GLUCOPHAGE-XR) 500 MG 24 hr tablet Take 500 mg by mouth daily with breakfast.    . neomycin-bacitracin-polymyxin (NEOSPORIN) ointment Apply 1 application topically daily. Ointment of choice and band-aid change daily until healed every day shift for wound care    . nitroGLYCERIN (NITROSTAT) 0.4 MG SL tablet Place 0.4 mg under the tongue every 5 (five) minutes as needed for chest pain.    . Omega-3 Fatty Acids (FISH OIL) 1000 MG CAPS Take 1,000 mg by mouth daily.    . ondansetron (ZOFRAN) 4 MG tablet Take 1 tablet (4 mg total) by mouth every 6 (six) hours as needed for nausea. 20 tablet 0  . OXYGEN Inhale 2 L into the lungs continuous.    . polyethylene glycol (MIRALAX / GLYCOLAX) packet Take 17 g by mouth daily. (Patient taking differently: Take 17 g by mouth daily as needed for mild constipation. ) 14 each 0  . ranitidine (ZANTAC) 150 MG tablet Take 150 mg by mouth 2 (two) times daily.     . sodium chloride (OCEAN) 0.65 % SOLN nasal spray Place 1 spray into both nostrils as needed for congestion.  0  . sucralfate (CARAFATE) 1 g tablet Take 1 g by mouth 2 (two) times daily.    . traZODone (DESYREL) 50 MG tablet Take 50 mg by mouth daily as needed for sleep.   3   No facility-administered medications prior to visit.        Objective:   Physical Exam  Vitals:   10/06/19 1217  BP: 130/70  Pulse: 71  SpO2: 100%  Weight: 196 lb 6.4 oz (89.1 kg)  Height: 5' (1.524 m)   Gen: Pleasant, elderly woman, in no distress,  normal affect  ENT: No lesions,  mouth clear,  oropharynx clear, no postnasal drip  Neck: No JVD, no stridor  Lungs: No use of accessory muscles,  small breaths, coarse bilateral breath sounds, basilar inspiratory crackles, no wheezing  Cardiovascular: RRR, heart sounds normal, no murmur or gallops, no peripheral edema  Musculoskeletal: No deformities, no cyanosis or clubbing  Neuro: alert, awake, non focal  Skin: Warm, no lesions or rash     Assessment & Plan:  COPD (chronic obstructive pulmonary disease) ([place]) She is having flaring symptoms, characterized by more chest mucus, dyspnea.  Unclear as to whether these are pneumonias versus COPD exacerbation and increased mucus production.  She has been treated with several courses of antibiotics.  Given the recurrence question whether she may be having aspiration.  We will order a modified barium swallow to evaluate further.  Reimage today to assess for infiltrates.  If the flares represent intermittent difficulty with secretion management then hopefully adding guaifenesin and flutter valve will be beneficial.   Please continue Symbicort 2 puffs twice a day.  Rinse and gargle after using. Please continue DuoNeb 4 times a day on a schedule. Chest x-ray today to evaluate for recurrent pneumonia We will perform a modified barium swallowing test to ensure no evidence of aspiration when you eat and drink Please start using a flutter valve 2 times a day every day Do your incentive spirometer 3 times a day, 10 breaths each time Please start guaifenesin 600 mg (generic Mucinex) twice a day Continue oxygen at 2 L/min Follow with Dr. Lamonte Sakai in 1 month to review your testing  Baltazar Apo, MD, PhD 10/06/2019, 5:36 PM [facility] [phone] or if no answer [phone]

## 2019-10-07 ENCOUNTER — Other Ambulatory Visit (HOSPITAL_COMMUNITY): Payer: Self-pay

## 2019-10-07 DIAGNOSIS — R1310 Dysphagia, unspecified: Secondary | ICD-10-CM

## 2019-10-07 DIAGNOSIS — R059 Cough, unspecified: Secondary | ICD-10-CM

## 2019-10-10 NOTE — Telephone Encounter (Signed)
Xray report has been faxed to [facility]. Nothing further needed.

## 2019-10-13 DIAGNOSIS — R278 Other lack of coordination: Secondary | ICD-10-CM | POA: Diagnosis not present

## 2019-10-26 DIAGNOSIS — J9610 Chronic respiratory failure, unspecified whether with hypoxia or hypercapnia: Secondary | ICD-10-CM | POA: Diagnosis not present

## 2019-10-26 DIAGNOSIS — G8929 Other chronic pain: Secondary | ICD-10-CM | POA: Diagnosis not present

## 2019-10-26 DIAGNOSIS — J449 Chronic obstructive pulmonary disease, unspecified: Secondary | ICD-10-CM | POA: Diagnosis not present

## 2019-10-26 DIAGNOSIS — M25552 Pain in left hip: Secondary | ICD-10-CM | POA: Diagnosis not present

## 2019-11-01 ENCOUNTER — Other Ambulatory Visit: Payer: Self-pay

## 2019-11-01 ENCOUNTER — Ambulatory Visit (HOSPITAL_COMMUNITY)
Admission: RE | Admit: 2019-11-01 | Discharge: 2019-11-01 | Disposition: A | Discharge status: Home / Self Care | Payer: Medicare Other | Source: Ambulatory Visit | Attending: Emergency Medicine | Admitting: Emergency Medicine

## 2019-11-01 DIAGNOSIS — R1310 Dysphagia, unspecified: Secondary | ICD-10-CM | POA: Insufficient documentation

## 2019-11-01 DIAGNOSIS — R05 Cough: Secondary | ICD-10-CM | POA: Insufficient documentation

## 2019-11-01 DIAGNOSIS — J189 Pneumonia, unspecified organism: Secondary | ICD-10-CM | POA: Diagnosis not present

## 2019-11-01 DIAGNOSIS — R059 Cough, unspecified: Secondary | ICD-10-CM

## 2019-11-12 DIAGNOSIS — E1140 Type 2 diabetes mellitus with diabetic neuropathy, unspecified: Secondary | ICD-10-CM | POA: Diagnosis not present

## 2019-11-12 DIAGNOSIS — F0150 Vascular dementia without behavioral disturbance: Secondary | ICD-10-CM | POA: Diagnosis not present

## 2019-11-12 DIAGNOSIS — F0151 Vascular dementia with behavioral disturbance: Secondary | ICD-10-CM | POA: Diagnosis not present

## 2019-11-12 DIAGNOSIS — E119 Type 2 diabetes mellitus without complications: Secondary | ICD-10-CM | POA: Diagnosis not present

## 2019-11-12 DIAGNOSIS — M1990 Unspecified osteoarthritis, unspecified site: Secondary | ICD-10-CM | POA: Diagnosis not present

## 2019-11-12 DIAGNOSIS — J441 Chronic obstructive pulmonary disease with (acute) exacerbation: Secondary | ICD-10-CM | POA: Diagnosis not present

## 2019-11-12 DIAGNOSIS — E785 Hyperlipidemia, unspecified: Secondary | ICD-10-CM | POA: Diagnosis not present

## 2019-11-12 DIAGNOSIS — E139 Other specified diabetes mellitus without complications: Secondary | ICD-10-CM | POA: Diagnosis not present

## 2019-11-12 DIAGNOSIS — D649 Anemia, unspecified: Secondary | ICD-10-CM | POA: Diagnosis not present

## 2019-11-12 DIAGNOSIS — M810 Age-related osteoporosis without current pathological fracture: Secondary | ICD-10-CM | POA: Diagnosis not present

## 2019-11-12 DIAGNOSIS — I10 Essential (primary) hypertension: Secondary | ICD-10-CM | POA: Diagnosis not present

## 2019-11-12 DIAGNOSIS — J449 Chronic obstructive pulmonary disease, unspecified: Secondary | ICD-10-CM | POA: Diagnosis not present

## 2019-11-16 DIAGNOSIS — M79675 Pain in left toe(s): Secondary | ICD-10-CM | POA: Diagnosis not present

## 2019-11-16 DIAGNOSIS — B351 Tinea unguium: Secondary | ICD-10-CM | POA: Diagnosis not present

## 2019-11-16 DIAGNOSIS — E1140 Type 2 diabetes mellitus with diabetic neuropathy, unspecified: Secondary | ICD-10-CM | POA: Diagnosis not present

## 2019-11-16 DIAGNOSIS — M2042 Other hammer toe(s) (acquired), left foot: Secondary | ICD-10-CM | POA: Diagnosis not present

## 2019-11-17 DIAGNOSIS — J9610 Chronic respiratory failure, unspecified whether with hypoxia or hypercapnia: Secondary | ICD-10-CM | POA: Diagnosis not present

## 2019-11-17 DIAGNOSIS — F0150 Vascular dementia without behavioral disturbance: Secondary | ICD-10-CM | POA: Diagnosis not present

## 2019-11-17 DIAGNOSIS — K219 Gastro-esophageal reflux disease without esophagitis: Secondary | ICD-10-CM | POA: Diagnosis not present

## 2019-11-17 DIAGNOSIS — J449 Chronic obstructive pulmonary disease, unspecified: Secondary | ICD-10-CM | POA: Diagnosis not present

## 2019-11-17 DIAGNOSIS — M25552 Pain in left hip: Secondary | ICD-10-CM | POA: Diagnosis not present

## 2019-11-17 DIAGNOSIS — I10 Essential (primary) hypertension: Secondary | ICD-10-CM | POA: Diagnosis not present

## 2019-11-21 ENCOUNTER — Encounter: Payer: Self-pay | Admitting: Emergency Medicine

## 2019-11-21 ENCOUNTER — Other Ambulatory Visit: Payer: Self-pay

## 2019-11-21 ENCOUNTER — Ambulatory Visit (INDEPENDENT_AMBULATORY_CARE_PROVIDER_SITE_OTHER): Payer: Medicare Other | Admitting: Emergency Medicine

## 2019-11-21 DIAGNOSIS — J449 Chronic obstructive pulmonary disease, unspecified: Secondary | ICD-10-CM | POA: Diagnosis not present

## 2019-11-21 NOTE — Patient Instructions (Signed)
Swallowing testing did not show any evidence of aspiration.  This is good news. Please continue Symbicort 2 puffs twice a day.  Rinse and gargle after using this medication. Please continue your DuoNeb, 4 times a day on a schedule Continue guaifenesin 600 mg once daily Continue flutter valve twice a day, 10 puffs each time Follow with Dr Lamonte Sakai in 6 months or sooner if you have any problems

## 2019-11-21 NOTE — Progress Notes (Signed)
Subjective:    Patient ID: Candice Miller, female    DOB: 27-Jul-1924, 86 y.o.   MRN: [id number]  HPI 86 year old never smoker with a history of breast cancer, diabetes, hypertension on lisinopril, anemia.  She carries a diagnosis of COPD that was made years ago in [place].  Referred today for evaluation of recurrent flares that have been treated as PNA. She has had CXR's at [facility] that reportedly have shown LL infiltrates. She has been treated with abx x 3 since March. She is on O2  She reports that she is dyspneic with transferring, any ADL's - longstanding. When she is flaring, she has more mucous, "rattling" in her chest. She has also had desaturations, usually on 2L/min. She does feel that the nebs help her dyspnea. She denies any aspiration sx, choking  She is currently on Symbicort, DuoNeb which she uses 4 times a day on a schedule.   She is on Flonase, Allegra, Pepcid twice daily.  On lisinopril as above.   Echocardiogram 01/20/2018 reviewed, shows LVEF 40-08%, grade 1 diastolic dysfunction normal RV size and function, estimated PASP 19 mmHg  Chest x-ray 01/17/2018 reviewed by me shows bilateral interstitial prominence without focal infiltrate CT scan of the abdomen done 11/02/2017 reviewed, shows basilar emphysematous changes and scar with a small hiatal hernia CT chest 06/10/2017 >> pulm nodule  ROV 11/21/19 --86 year old woman, never smoker with a history of COPD.  Also with a history of breast cancer, diabetes, hypertension on ACE inhibitor, anemia.  Has been experiencing recurrent flares of pneumonia, question whether these represent acute exacerbations of COPD.  Has been treated with antibiotics for these.  I questioned possible aspiration and modified barium swallow was performed on 11/01/2019 which showed no frank aspiration or penetration except some flash penetration with thin liquids.  She was deemed to be a mild aspiration risk and no dietary changes were recommended.  Start  guaifenesin, flutter valve.  She was already on Symbicort and scheduled DuoNeb.  Today she reports that her breathing has been stable, has been able to clear her secretions, usually light yellow.    Review of Systems As per HPI     Objective:   Physical Exam  Vitals:   11/21/19 1449  BP: 132/70  Pulse: 72  Temp: 98.1 F (36.7 C)  TempSrc: Temporal  SpO2: 99%  Weight: 190 lb 4.8 oz (86.3 kg)  Height: 5\' 1"  (1.549 m)   Gen: Pleasant, elderly woman, in no distress,  normal affect  ENT: No lesions,  mouth clear,  oropharynx clear, no postnasal drip  Neck: No JVD, no stridor  Lungs: No use of accessory muscles, small breaths, coarse bilateral breath sounds, basilar inspiratory crackles, no wheezing  Cardiovascular: RRR, heart sounds normal, no murmur or gallops, no peripheral edema  Musculoskeletal: No deformities, no cyanosis or clubbing  Neuro: alert, awake, non focal  Skin: Warm, no lesions or rash     Assessment & Plan:  COPD (chronic obstructive pulmonary disease) ([facility]) COPD despite being a never smoker, question fixed asthma.  She benefits from bronchodilator regimen we will continue same as below.  Frequent flares with bronchitic symptoms, increased mucus production that is been treated with antibiotics.  She does not aspirate on a modified barium swallow.  Question whether there may be benefit to changing her ACE inhibitor at some point in the future.  For now she has benefited from guaifenesin, flutter valve.  Swallowing testing did not show any evidence of aspiration.  This is  good news. Please continue Symbicort 2 puffs twice a day.  Rinse and gargle after using this medication. Please continue your DuoNeb, 4 times a day on a schedule Continue guaifenesin 600 mg once daily Continue flutter valve twice a day, 10 puffs each time Follow with Dr Lamonte Sakai in 6 months or sooner if you have any problems  Baltazar Apo, MD, PhD 11/21/2019, 3:03 PM [facility] [phone] or if no answer [phone]

## 2019-11-21 NOTE — Assessment & Plan Note (Signed)
COPD despite being a never smoker, question fixed asthma.  She benefits from bronchodilator regimen we will continue same as below.  Frequent flares with bronchitic symptoms, increased mucus production that is been treated with antibiotics.  She does not aspirate on a modified barium swallow.  Question whether there may be benefit to changing her ACE inhibitor at some point in the future.  For now she has benefited from guaifenesin, flutter valve.  Swallowing testing did not show any evidence of aspiration.  This is good news. Please continue Symbicort 2 puffs twice a day.  Rinse and gargle after using this medication. Please continue your DuoNeb, 4 times a day on a schedule Continue guaifenesin 600 mg once daily Continue flutter valve twice a day, 10 puffs each time Follow with Candice Miller in 6 months or sooner if you have any problems

## 2019-11-22 DIAGNOSIS — D649 Anemia, unspecified: Secondary | ICD-10-CM | POA: Diagnosis not present

## 2019-11-22 DIAGNOSIS — M810 Age-related osteoporosis without current pathological fracture: Secondary | ICD-10-CM | POA: Diagnosis not present

## 2019-11-22 DIAGNOSIS — I10 Essential (primary) hypertension: Secondary | ICD-10-CM | POA: Diagnosis not present

## 2019-11-22 DIAGNOSIS — F0150 Vascular dementia without behavioral disturbance: Secondary | ICD-10-CM | POA: Diagnosis not present

## 2019-11-22 DIAGNOSIS — M1990 Unspecified osteoarthritis, unspecified site: Secondary | ICD-10-CM | POA: Diagnosis not present

## 2019-11-22 DIAGNOSIS — E1140 Type 2 diabetes mellitus with diabetic neuropathy, unspecified: Secondary | ICD-10-CM | POA: Diagnosis not present

## 2019-11-22 DIAGNOSIS — E119 Type 2 diabetes mellitus without complications: Secondary | ICD-10-CM | POA: Diagnosis not present

## 2019-11-22 DIAGNOSIS — F0151 Vascular dementia with behavioral disturbance: Secondary | ICD-10-CM | POA: Diagnosis not present

## 2019-11-22 DIAGNOSIS — J441 Chronic obstructive pulmonary disease with (acute) exacerbation: Secondary | ICD-10-CM | POA: Diagnosis not present

## 2019-11-22 DIAGNOSIS — J449 Chronic obstructive pulmonary disease, unspecified: Secondary | ICD-10-CM | POA: Diagnosis not present

## 2019-11-22 DIAGNOSIS — E139 Other specified diabetes mellitus without complications: Secondary | ICD-10-CM | POA: Diagnosis not present

## 2019-11-22 DIAGNOSIS — E785 Hyperlipidemia, unspecified: Secondary | ICD-10-CM | POA: Diagnosis not present

## 2019-11-23 DIAGNOSIS — J449 Chronic obstructive pulmonary disease, unspecified: Secondary | ICD-10-CM | POA: Diagnosis not present

## 2019-11-23 DIAGNOSIS — G8929 Other chronic pain: Secondary | ICD-10-CM | POA: Diagnosis not present

## 2019-11-23 DIAGNOSIS — K5900 Constipation, unspecified: Secondary | ICD-10-CM | POA: Diagnosis not present

## 2019-11-23 DIAGNOSIS — I272 Pulmonary hypertension, unspecified: Secondary | ICD-10-CM | POA: Diagnosis not present

## 2019-11-23 DIAGNOSIS — J9610 Chronic respiratory failure, unspecified whether with hypoxia or hypercapnia: Secondary | ICD-10-CM | POA: Diagnosis not present

## 2019-11-30 DIAGNOSIS — H538 Other visual disturbances: Secondary | ICD-10-CM | POA: Diagnosis not present

## 2019-11-30 DIAGNOSIS — H52223 Regular astigmatism, bilateral: Secondary | ICD-10-CM | POA: Diagnosis not present

## 2019-11-30 DIAGNOSIS — H3530 Unspecified macular degeneration: Secondary | ICD-10-CM | POA: Diagnosis not present

## 2019-11-30 DIAGNOSIS — K219 Gastro-esophageal reflux disease without esophagitis: Secondary | ICD-10-CM | POA: Diagnosis not present

## 2019-11-30 DIAGNOSIS — E1165 Type 2 diabetes mellitus with hyperglycemia: Secondary | ICD-10-CM | POA: Diagnosis not present

## 2019-11-30 DIAGNOSIS — I10 Essential (primary) hypertension: Secondary | ICD-10-CM | POA: Diagnosis not present

## 2019-12-15 DIAGNOSIS — J449 Chronic obstructive pulmonary disease, unspecified: Secondary | ICD-10-CM | POA: Diagnosis not present

## 2019-12-15 DIAGNOSIS — G8929 Other chronic pain: Secondary | ICD-10-CM | POA: Diagnosis not present

## 2019-12-15 DIAGNOSIS — J9610 Chronic respiratory failure, unspecified whether with hypoxia or hypercapnia: Secondary | ICD-10-CM | POA: Diagnosis not present

## 2019-12-15 DIAGNOSIS — I272 Pulmonary hypertension, unspecified: Secondary | ICD-10-CM | POA: Diagnosis not present

## 2019-12-20 ENCOUNTER — Encounter (INDEPENDENT_AMBULATORY_CARE_PROVIDER_SITE_OTHER): Payer: Self-pay

## 2019-12-20 ENCOUNTER — Telehealth: Payer: Self-pay | Admitting: Cardiovascular Disease

## 2019-12-20 NOTE — Telephone Encounter (Signed)
-----   Message from Ricci Barker, RN sent at 12/17/2019  4:41 PM EDT ----- This new patient will need to be moved off of Dr. Tyrell Antonio schedule for 9/14. She is not a PV referral-thank you.

## 2019-12-20 NOTE — Telephone Encounter (Signed)
Left message to call and reschedule 12/27/19 appt  with a different provider

## 2019-12-21 DIAGNOSIS — I10 Essential (primary) hypertension: Secondary | ICD-10-CM | POA: Diagnosis not present

## 2019-12-21 DIAGNOSIS — J449 Chronic obstructive pulmonary disease, unspecified: Secondary | ICD-10-CM | POA: Diagnosis not present

## 2019-12-21 DIAGNOSIS — F0150 Vascular dementia without behavioral disturbance: Secondary | ICD-10-CM | POA: Diagnosis not present

## 2019-12-21 DIAGNOSIS — K219 Gastro-esophageal reflux disease without esophagitis: Secondary | ICD-10-CM | POA: Diagnosis not present

## 2019-12-21 DIAGNOSIS — I272 Pulmonary hypertension, unspecified: Secondary | ICD-10-CM | POA: Diagnosis not present

## 2019-12-22 DIAGNOSIS — R0602 Shortness of breath: Secondary | ICD-10-CM | POA: Diagnosis not present

## 2019-12-22 DIAGNOSIS — J398 Other specified diseases of upper respiratory tract: Secondary | ICD-10-CM | POA: Diagnosis not present

## 2019-12-27 ENCOUNTER — Ambulatory Visit: Payer: Medicare Other | Admitting: Cardiovascular Disease

## 2020-01-05 DIAGNOSIS — E139 Other specified diabetes mellitus without complications: Secondary | ICD-10-CM | POA: Diagnosis not present

## 2020-01-05 DIAGNOSIS — E1140 Type 2 diabetes mellitus with diabetic neuropathy, unspecified: Secondary | ICD-10-CM | POA: Diagnosis not present

## 2020-01-05 DIAGNOSIS — E785 Hyperlipidemia, unspecified: Secondary | ICD-10-CM | POA: Diagnosis not present

## 2020-01-05 DIAGNOSIS — F0151 Vascular dementia with behavioral disturbance: Secondary | ICD-10-CM | POA: Diagnosis not present

## 2020-01-05 DIAGNOSIS — M1990 Unspecified osteoarthritis, unspecified site: Secondary | ICD-10-CM | POA: Diagnosis not present

## 2020-01-05 DIAGNOSIS — J441 Chronic obstructive pulmonary disease with (acute) exacerbation: Secondary | ICD-10-CM | POA: Diagnosis not present

## 2020-01-05 DIAGNOSIS — D649 Anemia, unspecified: Secondary | ICD-10-CM | POA: Diagnosis not present

## 2020-01-05 DIAGNOSIS — M810 Age-related osteoporosis without current pathological fracture: Secondary | ICD-10-CM | POA: Diagnosis not present

## 2020-01-05 DIAGNOSIS — J449 Chronic obstructive pulmonary disease, unspecified: Secondary | ICD-10-CM | POA: Diagnosis not present

## 2020-01-05 DIAGNOSIS — E119 Type 2 diabetes mellitus without complications: Secondary | ICD-10-CM | POA: Diagnosis not present

## 2020-01-05 DIAGNOSIS — F0150 Vascular dementia without behavioral disturbance: Secondary | ICD-10-CM | POA: Diagnosis not present

## 2020-01-05 DIAGNOSIS — I10 Essential (primary) hypertension: Secondary | ICD-10-CM | POA: Diagnosis not present

## 2020-01-07 DIAGNOSIS — R531 Weakness: Secondary | ICD-10-CM | POA: Diagnosis not present

## 2020-01-09 DIAGNOSIS — J811 Chronic pulmonary edema: Secondary | ICD-10-CM | POA: Diagnosis not present

## 2020-01-10 NOTE — Progress Notes (Signed)
Cardiology Office Note:    Date:  01/11/2020   ID:  Candice Miller, DOB Aug 14, 1924, MRN [id number]  PCP:  Candice Marble, MD  [place] Cardiologist:  No primary care provider on file.  [facility] Electrophysiologist:  None   Referring MD: Candice Blalock, NP    History of Present Illness:    Candice Miller is a 86 y.o. female with a hx of HTN, DMII, COPD, dementia, breast cancer s/p mastectomy, and reported history of CAD who was referred to clinic by Candice Limbo, NP for concern for diastolic dysfunction.  Had episode of substernal chest pain in 2019 for which she went to the ED. Pain was substernal in nature and responded to SL NTG. Trop negative. ECG without ischemic changes. Patient did not wish to pursue invasive work-up and the decision was made for continued medical management.   Today, the patient states she is short of breath with the mask. She is on 3L of oxygen at home for COPD. [place] last Saturday when she felt unsteady on her feet. Did not hit her head. No LOC. Blood pressures 120-130s/50-70s.Weight has been relatively stable over the past several weeks. Has not taken nitro SL as not having chest pain.  TTE in 2019 with normal LVEF 18-84%, grade I diastolic dysfunction, normal RV function and no significant valvular disease.  Past Medical History:  Diagnosis Date   Anemia    Breast cancer (Candice Miller) 1994   COPD (chronic obstructive pulmonary disease) (Candice Miller)    Diabetes mellitus without complication (Candice Miller)    diet controlled vs. on glimepride   Hypertension    NPH (normal pressure hydrocephalus) (Candice Miller)    Osteoporosis     Past Surgical History:  Procedure Laterality Date   CHOLECYSTECTOMY     COLONOSCOPY WITH PROPOFOL N/A 01/01/2017   Procedure: COLONOSCOPY WITH PROPOFOL;  Surgeon: Candice Spates, MD;  Location: [facility];  Service: Endoscopy;  Laterality: N/A;   MASTECTOMY  1994   TUBAL LIGATION      Current Medications: Current Meds    Medication Sig   acetaminophen (TYLENOL) 500 MG tablet Take 1,000 mg by mouth every 6 (six) hours.    albuterol (PROVENTIL HFA;VENTOLIN HFA) 108 (90 Base) MCG/ACT inhaler Inhale 2 puffs into the lungs every 6 (six) hours as needed for wheezing or shortness of breath.    atorvastatin (LIPITOR) 10 MG tablet Take 10 mg by mouth daily.   budesonide-formoterol (SYMBICORT) 80-4.5 MCG/ACT inhaler Inhale 2 puffs into the lungs 2 (two) times daily.   calcium carbonate (CALCIUM 600) 1500 (600 Ca) MG TABS tablet Take 1,200 mg by mouth daily.   cholecalciferol (VITAMIN D) 1000 units tablet Take 1,000 Units by mouth daily.   diltiazem (CARDIZEM CD) 180 MG 24 hr capsule Take 180 mg by mouth daily.   famotidine (PEPCID) 20 MG tablet Take 20 mg by mouth 2 (two) times daily.   fexofenadine (ALLEGRA) 180 MG tablet Take 180 mg by mouth as needed for allergies.    fluticasone (FLONASE) 50 MCG/ACT nasal spray Place 2 sprays into both nostrils daily.   furosemide (LASIX) 40 MG tablet Take 40 mg by mouth daily as needed for fluid or edema.    gabapentin (NEURONTIN) 300 MG capsule Take 300 mg by mouth at bedtime.    glimepiride (AMARYL) 1 MG tablet Take 1 mg by mouth See admin instructions. Take 1 mg by mouth once a day only if BGL is >150, per provider   guaiFENesin (MUCINEX) 600 MG 12 hr  tablet Take 600 mg by mouth daily as needed.   HUMALOG KWIKPEN 100 UNIT/ML KwikPen Inject into the skin 3 (three) times daily.    ipratropium-albuterol (DUONEB) 0.5-2.5 (3) MG/3ML SOLN Take 3 mLs by nebulization 4 (four) times daily.    iron polysaccharides (NIFEREX) 150 MG capsule Take 150 mg by mouth 2 (two) times daily.    l-methylfolate-B6-B12 (METANX) 3-35-2 MG TABS tablet Take 1 tablet by mouth 2 (two) times daily.    Lidocaine (ASPERCREME LIDOCAINE) 4 % PTCH Apply 1 patch topically 2 (two) times daily.    Lidocaine-Glycerin (PREPARATION H) 5-14.4 % CREA Place 1 application rectally as needed  (Constipation). Unsupervised  Self administration    lisinopril (PRINIVIL,ZESTRIL) 10 MG tablet Take 10 mg by mouth daily.   Magnesium 250 MG TABS Take 250 mg by mouth See admin instructions. Every 24 hours as needed for constipation "May take at bedtime PRN   memantine (NAMENDA) 10 MG tablet Take 10 mg by mouth 2 (two) times daily.   metFORMIN (GLUCOPHAGE-XR) 500 MG 24 hr tablet Take 500 mg by mouth daily with breakfast.   neomycin-bacitracin-polymyxin (NEOSPORIN) ointment Apply 1 application topically daily. Ointment of choice and band-aid change daily until healed every day shift for wound care   nitroGLYCERIN (NITROSTAT) 0.4 MG SL tablet Place 0.4 mg under the tongue every 5 (five) minutes as needed for chest pain.   Omega-3 Fatty Acids (FISH OIL) 1000 MG CAPS Take 1,000 mg by mouth daily.   ondansetron (ZOFRAN) 4 MG tablet Take 1 tablet (4 mg total) by mouth every 6 (six) hours as needed for nausea.   OXYGEN Inhale 2 L into the lungs continuous.   polyethylene glycol (MIRALAX / GLYCOLAX) packet Take 17 g by mouth daily.   ranitidine (ZANTAC) 150 MG tablet Take 150 mg by mouth 2 (two) times daily.    sodium chloride (OCEAN) 0.65 % SOLN nasal spray Place 1 spray into both nostrils as needed for congestion.   sucralfate (CARAFATE) 1 g tablet Take 1 g by mouth 2 (two) times daily.   traZODone (DESYREL) 50 MG tablet Take 50 mg by mouth daily as needed for sleep.      Allergies:   Adhesive [tape], Macrodantin [nitrofurantoin], Sulfa antibiotics, and Keflex [cephalexin]   Social History   Socioeconomic History   Marital status: Widowed    Spouse name: Not on file   Number of children: Not on file   Years of education: Not on file   Highest education level: Not on file  Occupational History   Occupation: retired  Tobacco Use   Smoking status: Never Smoker   Smokeless tobacco: Former Systems developer    Types: Snuff, Chew   Tobacco comment: lived with smokers most of her life;  oral tobacco for 10 years?  Vaping Use   Vaping Use: Never used  Substance and Sexual Activity   Alcohol use: No   Drug use: No   Sexual activity: Not on file  Other Topics Concern   Not on file  Social History Narrative   Lives with daughter and son-in-law.  Widowed.   Social Determinants of Health   Financial Resource Strain:    Difficulty of Paying Living Expenses: Not on file  Food Insecurity:    Worried About Charity fundraiser in the Last Year: Not on file   [employer] of Food in the Last Year: Not on file  Transportation Needs:    Lack of Transportation (Medical): Not on file   Lack  of Transportation (Non-Medical): Not on file  Physical Activity:    Days of Exercise per Week: Not on file   Minutes of Exercise per Session: Not on file  Stress:    Feeling of Stress : Not on file  Social Connections:    Frequency of Communication with Friends and Family: Not on file   Frequency of Social Gatherings with Friends and Family: Not on file   Attends Religious Services: Not on file   Active Member of Clubs or Organizations: Not on file   Attends Archivist Meetings: Not on file   Marital Status: Not on file     Family History: The patient's family history includes Diabetes in her father and mother; Heart failure in her father and mother; Hypertension in her father and mother; Stroke in her father and mother.  ROS:   Please see the history of present illness.    Notes shortness of breath and wheezing that improves with breathing treatment. Has occasional lightheadedness when she stands. No fevers or chills. Has 2+ LE edema for which she wears compression stockings. No chest pressure or chest pain. Has cough but no sputum.  EKGs/Labs/Other Studies Reviewed:    The following studies were reviewed today: TTE 07-29-17: - Left ventricle: The cavity size was normal. Wall thickness was  normal. Systolic function was normal. The estimated ejection   fraction was in the range of 55% to 60%. Wall motion was normal;  there were no regional wall motion abnormalities. Doppler  parameters are consistent with abnormal left ventricular  relaxation (grade 1 diastolic dysfunction).  - Aortic valve: Trileaflet; moderately calcified leaflets. There  was no stenosis. There was trivial regurgitation.  - Mitral valve: Moderately calcified annulus. There was trivial  regurgitation.  - Left atrium: The atrium was mildly dilated.  - Right ventricle: The cavity size was normal. Systolic function  was normal.  - Tricuspid valve: Peak RV-RA gradient (S): 16 mm Hg.  - Pulmonary arteries: PA peak pressure: 19 mm Hg (S).  - Inferior vena cava: The vessel was normal in size. The  respirophasic diameter changes were in the normal range (>= 50%),  consistent with normal central venous pressure.   EKG:  EKG is  ordered today.  The ekg ordered today demonstrates sinus arrhythmia with HR 67  Recent Labs: Cr 1.2, TSH 2.458, Plt 272, HgB 11.5, K 4.1, ALT 9, INR 1.05  Recent Lipid Panel LDL 109, HDL 42, TG 241, TC 193  Physical Exam:    VS:  BP (!) 132/50    Pulse 67    Ht 5\' 1"  (1.549 m)    Wt 196 lb (88.9 kg)    SpO2 98%    BMI 37.03 kg/m     Wt Readings from Last 3 Encounters:  01/11/20 196 lb (88.9 kg)  11/21/19 190 lb 4.8 oz (86.3 kg)  10/06/19 196 lb 6.4 oz (89.1 kg)     GEN: Elderly female, on home 3L oxygen, NAD HEENT: Normal NECK: No JVD; No carotid bruits CARDIAC: RR, 1/6 systolic murmur best heard at RUSB RESPIRATORY:   Diffuse inspiratory crackles throughout; prolonged expiratory phase ABDOMEN: Soft, non-tender, non-distended MUSCULOSKELETAL:  2+ pitting edema; compression stockings in place SKIN: Warm and dry NEUROLOGIC:  Able to answer simple questions. Sometimes confused during examination, but no focal deficits noted   ASSESSMENT:    1. Diastolic dysfunction   2. Essential hypertension   3. Lower extremity  edema   4. Type 2 diabetes mellitus  with diabetic peripheral angiopathy without gangrene, without long-term current use of insulin (HCC)    PLAN:    In order of problems listed above:  #HTN: Well controlled at nursing home per nursing sheet. SBPs 120-130s/70s. Given elderly age and frailty, do not want to be overly aggressive with blood pressure medications. She is currently well controlled. -Continue lisinopril 10mg  daily  #LE Edema #Venous insufficiency  #Grade I Diastolic Dysfunction: -Continue compression stockings to the legs bilaterally -If gains >3lbs in 2 days or 5lbs in 1 week, will need lasix 40mg  x3 days or until back to baseline weight -Patient prone to falling so want to be careful with dehydration  #DMII: Uncontrolled with A1C 10. Managed by PCP. -Follow-up with PCP as scheduled  #COPD: On home 3L O2. Likely main driver of SOB. -Continue inhalers and home O2 per pulm -Follow-up with pulm as scheduled   Medication Adjustments/Labs and Tests Ordered: Current medicines are reviewed at length with the patient today.  Concerns regarding medicines are outlined above.  Orders Placed This Encounter  Procedures   EKG 12-Lead   No orders of the defined types were placed in this encounter.   Patient Instructions  Medication Instructions:  Your physician recommends that you continue on your current medications as directed. Please refer to the Current Medication list given to you today.  *If you need a refill on your cardiac medications before your next appointment, please call your pharmacy*   Lab Work: None  If you have labs (blood work) drawn today and your tests are completely normal, you will receive your results only by:  [place] (if you have MyChart) OR  A paper copy in the mail If you have any lab test that is abnormal or we need to change your treatment, we will call you to review the  results.   Testing/Procedures: None   Follow-Up: Follow up with Dr. Johney Frame on 07/05/2020 at 1:20 PM   Other Instructions If you have significant weight gain or swelling you may take an extra 40 mg of furosemide daily for 3 days and then decrease back to AS NEEDED     Signed, Freada Bergeron, MD  01/11/2020 4:02 PM    [facility]

## 2020-01-11 ENCOUNTER — Encounter: Payer: Self-pay | Admitting: Cardiology

## 2020-01-11 ENCOUNTER — Ambulatory Visit (INDEPENDENT_AMBULATORY_CARE_PROVIDER_SITE_OTHER): Payer: Medicare Other | Admitting: Cardiology

## 2020-01-11 ENCOUNTER — Other Ambulatory Visit: Payer: Self-pay

## 2020-01-11 VITALS — BP 132/50 | HR 67 | Ht 61.0 in | Wt 196.0 lb

## 2020-01-11 DIAGNOSIS — I5189 Other ill-defined heart diseases: Secondary | ICD-10-CM | POA: Diagnosis not present

## 2020-01-11 DIAGNOSIS — E1151 Type 2 diabetes mellitus with diabetic peripheral angiopathy without gangrene: Secondary | ICD-10-CM

## 2020-01-11 DIAGNOSIS — I10 Essential (primary) hypertension: Secondary | ICD-10-CM | POA: Diagnosis not present

## 2020-01-11 DIAGNOSIS — R600 Localized edema: Secondary | ICD-10-CM

## 2020-01-11 NOTE — Patient Instructions (Addendum)
Medication Instructions:  Your physician recommends that you continue on your current medications as directed. Please refer to the Current Medication list given to you today.  *If you need a refill on your cardiac medications before your next appointment, please call your pharmacy*   Lab Work: None  If you have labs (blood work) drawn today and your tests are completely normal, you will receive your results only by: [facility] MyChart Message (if you have MyChart) OR . A paper copy in the mail If you have any lab test that is abnormal or we need to change your treatment, we will call you to review the results.   Testing/Procedures: None   Follow-Up: Follow up with Dr. Johney Frame on 07/05/2020 at 1:20 PM   Other Instructions If you have significant weight gain or swelling you may take an extra 40 mg of furosemide daily for 3 days and then decrease back to AS NEEDED

## 2020-01-18 DIAGNOSIS — J449 Chronic obstructive pulmonary disease, unspecified: Secondary | ICD-10-CM | POA: Diagnosis not present

## 2020-01-18 DIAGNOSIS — K5909 Other constipation: Secondary | ICD-10-CM | POA: Diagnosis not present

## 2020-01-18 DIAGNOSIS — E119 Type 2 diabetes mellitus without complications: Secondary | ICD-10-CM | POA: Diagnosis not present

## 2020-01-18 DIAGNOSIS — G8929 Other chronic pain: Secondary | ICD-10-CM | POA: Diagnosis not present

## 2020-01-18 DIAGNOSIS — E785 Hyperlipidemia, unspecified: Secondary | ICD-10-CM | POA: Diagnosis not present

## 2020-01-18 DIAGNOSIS — J9610 Chronic respiratory failure, unspecified whether with hypoxia or hypercapnia: Secondary | ICD-10-CM | POA: Diagnosis not present

## 2020-01-19 DIAGNOSIS — I272 Pulmonary hypertension, unspecified: Secondary | ICD-10-CM | POA: Diagnosis not present

## 2020-01-19 DIAGNOSIS — J9610 Chronic respiratory failure, unspecified whether with hypoxia or hypercapnia: Secondary | ICD-10-CM | POA: Diagnosis not present

## 2020-01-19 DIAGNOSIS — J449 Chronic obstructive pulmonary disease, unspecified: Secondary | ICD-10-CM | POA: Diagnosis not present

## 2020-01-19 DIAGNOSIS — E119 Type 2 diabetes mellitus without complications: Secondary | ICD-10-CM | POA: Diagnosis not present

## 2020-01-31 DIAGNOSIS — M810 Age-related osteoporosis without current pathological fracture: Secondary | ICD-10-CM | POA: Diagnosis not present

## 2020-01-31 DIAGNOSIS — E1140 Type 2 diabetes mellitus with diabetic neuropathy, unspecified: Secondary | ICD-10-CM | POA: Diagnosis not present

## 2020-01-31 DIAGNOSIS — J449 Chronic obstructive pulmonary disease, unspecified: Secondary | ICD-10-CM | POA: Diagnosis not present

## 2020-01-31 DIAGNOSIS — E139 Other specified diabetes mellitus without complications: Secondary | ICD-10-CM | POA: Diagnosis not present

## 2020-01-31 DIAGNOSIS — F0150 Vascular dementia without behavioral disturbance: Secondary | ICD-10-CM | POA: Diagnosis not present

## 2020-01-31 DIAGNOSIS — D649 Anemia, unspecified: Secondary | ICD-10-CM | POA: Diagnosis not present

## 2020-01-31 DIAGNOSIS — J441 Chronic obstructive pulmonary disease with (acute) exacerbation: Secondary | ICD-10-CM | POA: Diagnosis not present

## 2020-01-31 DIAGNOSIS — F0151 Vascular dementia with behavioral disturbance: Secondary | ICD-10-CM | POA: Diagnosis not present

## 2020-01-31 DIAGNOSIS — E119 Type 2 diabetes mellitus without complications: Secondary | ICD-10-CM | POA: Diagnosis not present

## 2020-01-31 DIAGNOSIS — I10 Essential (primary) hypertension: Secondary | ICD-10-CM | POA: Diagnosis not present

## 2020-01-31 DIAGNOSIS — E785 Hyperlipidemia, unspecified: Secondary | ICD-10-CM | POA: Diagnosis not present

## 2020-01-31 DIAGNOSIS — M1990 Unspecified osteoarthritis, unspecified site: Secondary | ICD-10-CM | POA: Diagnosis not present

## 2020-02-02 DIAGNOSIS — E785 Hyperlipidemia, unspecified: Secondary | ICD-10-CM | POA: Diagnosis not present

## 2020-02-02 DIAGNOSIS — E1140 Type 2 diabetes mellitus with diabetic neuropathy, unspecified: Secondary | ICD-10-CM | POA: Diagnosis not present

## 2020-02-16 DIAGNOSIS — I272 Pulmonary hypertension, unspecified: Secondary | ICD-10-CM | POA: Diagnosis not present

## 2020-02-16 DIAGNOSIS — J9610 Chronic respiratory failure, unspecified whether with hypoxia or hypercapnia: Secondary | ICD-10-CM | POA: Diagnosis not present

## 2020-02-16 DIAGNOSIS — I10 Essential (primary) hypertension: Secondary | ICD-10-CM | POA: Diagnosis not present

## 2020-02-16 DIAGNOSIS — J449 Chronic obstructive pulmonary disease, unspecified: Secondary | ICD-10-CM | POA: Diagnosis not present

## 2020-02-20 DIAGNOSIS — E119 Type 2 diabetes mellitus without complications: Secondary | ICD-10-CM | POA: Diagnosis not present

## 2020-02-20 DIAGNOSIS — E785 Hyperlipidemia, unspecified: Secondary | ICD-10-CM | POA: Diagnosis not present

## 2020-02-20 DIAGNOSIS — J9610 Chronic respiratory failure, unspecified whether with hypoxia or hypercapnia: Secondary | ICD-10-CM | POA: Diagnosis not present

## 2020-02-20 DIAGNOSIS — G8929 Other chronic pain: Secondary | ICD-10-CM | POA: Diagnosis not present

## 2020-02-20 DIAGNOSIS — J449 Chronic obstructive pulmonary disease, unspecified: Secondary | ICD-10-CM | POA: Diagnosis not present

## 2020-02-20 DIAGNOSIS — J441 Chronic obstructive pulmonary disease with (acute) exacerbation: Secondary | ICD-10-CM | POA: Diagnosis not present

## 2020-02-20 DIAGNOSIS — F0150 Vascular dementia without behavioral disturbance: Secondary | ICD-10-CM | POA: Diagnosis not present

## 2020-02-20 DIAGNOSIS — E139 Other specified diabetes mellitus without complications: Secondary | ICD-10-CM | POA: Diagnosis not present

## 2020-02-20 DIAGNOSIS — E1140 Type 2 diabetes mellitus with diabetic neuropathy, unspecified: Secondary | ICD-10-CM | POA: Diagnosis not present

## 2020-02-20 DIAGNOSIS — D649 Anemia, unspecified: Secondary | ICD-10-CM | POA: Diagnosis not present

## 2020-02-20 DIAGNOSIS — F0151 Vascular dementia with behavioral disturbance: Secondary | ICD-10-CM | POA: Diagnosis not present

## 2020-02-20 DIAGNOSIS — M810 Age-related osteoporosis without current pathological fracture: Secondary | ICD-10-CM | POA: Diagnosis not present

## 2020-02-20 DIAGNOSIS — I272 Pulmonary hypertension, unspecified: Secondary | ICD-10-CM | POA: Diagnosis not present

## 2020-02-20 DIAGNOSIS — I10 Essential (primary) hypertension: Secondary | ICD-10-CM | POA: Diagnosis not present

## 2020-02-20 DIAGNOSIS — M1990 Unspecified osteoarthritis, unspecified site: Secondary | ICD-10-CM | POA: Diagnosis not present

## 2020-03-15 DIAGNOSIS — J449 Chronic obstructive pulmonary disease, unspecified: Secondary | ICD-10-CM | POA: Diagnosis not present

## 2020-03-15 DIAGNOSIS — K5900 Constipation, unspecified: Secondary | ICD-10-CM | POA: Diagnosis not present

## 2020-03-15 DIAGNOSIS — J9610 Chronic respiratory failure, unspecified whether with hypoxia or hypercapnia: Secondary | ICD-10-CM | POA: Diagnosis not present

## 2020-03-15 DIAGNOSIS — I272 Pulmonary hypertension, unspecified: Secondary | ICD-10-CM | POA: Diagnosis not present

## 2020-03-20 DIAGNOSIS — E119 Type 2 diabetes mellitus without complications: Secondary | ICD-10-CM | POA: Diagnosis not present

## 2020-03-20 DIAGNOSIS — E139 Other specified diabetes mellitus without complications: Secondary | ICD-10-CM | POA: Diagnosis not present

## 2020-03-20 DIAGNOSIS — F0151 Vascular dementia with behavioral disturbance: Secondary | ICD-10-CM | POA: Diagnosis not present

## 2020-03-20 DIAGNOSIS — E1140 Type 2 diabetes mellitus with diabetic neuropathy, unspecified: Secondary | ICD-10-CM | POA: Diagnosis not present

## 2020-03-20 DIAGNOSIS — M1990 Unspecified osteoarthritis, unspecified site: Secondary | ICD-10-CM | POA: Diagnosis not present

## 2020-03-20 DIAGNOSIS — D649 Anemia, unspecified: Secondary | ICD-10-CM | POA: Diagnosis not present

## 2020-03-20 DIAGNOSIS — J441 Chronic obstructive pulmonary disease with (acute) exacerbation: Secondary | ICD-10-CM | POA: Diagnosis not present

## 2020-03-20 DIAGNOSIS — E785 Hyperlipidemia, unspecified: Secondary | ICD-10-CM | POA: Diagnosis not present

## 2020-03-20 DIAGNOSIS — I10 Essential (primary) hypertension: Secondary | ICD-10-CM | POA: Diagnosis not present

## 2020-03-20 DIAGNOSIS — F0150 Vascular dementia without behavioral disturbance: Secondary | ICD-10-CM | POA: Diagnosis not present

## 2020-03-20 DIAGNOSIS — M810 Age-related osteoporosis without current pathological fracture: Secondary | ICD-10-CM | POA: Diagnosis not present

## 2020-03-20 DIAGNOSIS — J449 Chronic obstructive pulmonary disease, unspecified: Secondary | ICD-10-CM | POA: Diagnosis not present

## 2020-03-26 DIAGNOSIS — E119 Type 2 diabetes mellitus without complications: Secondary | ICD-10-CM | POA: Diagnosis not present

## 2020-03-26 DIAGNOSIS — J449 Chronic obstructive pulmonary disease, unspecified: Secondary | ICD-10-CM | POA: Diagnosis not present

## 2020-03-26 DIAGNOSIS — K219 Gastro-esophageal reflux disease without esophagitis: Secondary | ICD-10-CM | POA: Diagnosis not present

## 2020-03-26 DIAGNOSIS — I272 Pulmonary hypertension, unspecified: Secondary | ICD-10-CM | POA: Diagnosis not present

## 2020-03-26 DIAGNOSIS — J9610 Chronic respiratory failure, unspecified whether with hypoxia or hypercapnia: Secondary | ICD-10-CM | POA: Diagnosis not present

## 2020-03-26 DIAGNOSIS — K5900 Constipation, unspecified: Secondary | ICD-10-CM | POA: Diagnosis not present

## 2020-03-26 DIAGNOSIS — E785 Hyperlipidemia, unspecified: Secondary | ICD-10-CM | POA: Diagnosis not present

## 2020-03-26 DIAGNOSIS — M1990 Unspecified osteoarthritis, unspecified site: Secondary | ICD-10-CM | POA: Diagnosis not present

## 2020-03-26 DIAGNOSIS — G8929 Other chronic pain: Secondary | ICD-10-CM | POA: Diagnosis not present

## 2020-03-26 DIAGNOSIS — I10 Essential (primary) hypertension: Secondary | ICD-10-CM | POA: Diagnosis not present

## 2020-03-27 DIAGNOSIS — M79675 Pain in left toe(s): Secondary | ICD-10-CM | POA: Diagnosis not present

## 2020-03-27 DIAGNOSIS — B351 Tinea unguium: Secondary | ICD-10-CM | POA: Diagnosis not present

## 2020-03-27 DIAGNOSIS — E119 Type 2 diabetes mellitus without complications: Secondary | ICD-10-CM | POA: Diagnosis not present

## 2020-04-12 DIAGNOSIS — I10 Essential (primary) hypertension: Secondary | ICD-10-CM | POA: Diagnosis not present

## 2020-04-12 DIAGNOSIS — K219 Gastro-esophageal reflux disease without esophagitis: Secondary | ICD-10-CM | POA: Diagnosis not present

## 2020-04-12 DIAGNOSIS — E119 Type 2 diabetes mellitus without complications: Secondary | ICD-10-CM | POA: Diagnosis not present

## 2020-04-12 DIAGNOSIS — J449 Chronic obstructive pulmonary disease, unspecified: Secondary | ICD-10-CM | POA: Diagnosis not present

## 2020-04-21 DIAGNOSIS — E139 Other specified diabetes mellitus without complications: Secondary | ICD-10-CM | POA: Diagnosis not present

## 2020-04-21 DIAGNOSIS — J441 Chronic obstructive pulmonary disease with (acute) exacerbation: Secondary | ICD-10-CM | POA: Diagnosis not present

## 2020-04-21 DIAGNOSIS — E1140 Type 2 diabetes mellitus with diabetic neuropathy, unspecified: Secondary | ICD-10-CM | POA: Diagnosis not present

## 2020-04-21 DIAGNOSIS — M810 Age-related osteoporosis without current pathological fracture: Secondary | ICD-10-CM | POA: Diagnosis not present

## 2020-04-21 DIAGNOSIS — J449 Chronic obstructive pulmonary disease, unspecified: Secondary | ICD-10-CM | POA: Diagnosis not present

## 2020-04-21 DIAGNOSIS — E785 Hyperlipidemia, unspecified: Secondary | ICD-10-CM | POA: Diagnosis not present

## 2020-04-21 DIAGNOSIS — I10 Essential (primary) hypertension: Secondary | ICD-10-CM | POA: Diagnosis not present

## 2020-04-21 DIAGNOSIS — D649 Anemia, unspecified: Secondary | ICD-10-CM | POA: Diagnosis not present

## 2020-04-21 DIAGNOSIS — E119 Type 2 diabetes mellitus without complications: Secondary | ICD-10-CM | POA: Diagnosis not present

## 2020-04-21 DIAGNOSIS — F0150 Vascular dementia without behavioral disturbance: Secondary | ICD-10-CM | POA: Diagnosis not present

## 2020-04-21 DIAGNOSIS — M1990 Unspecified osteoarthritis, unspecified site: Secondary | ICD-10-CM | POA: Diagnosis not present

## 2020-04-21 DIAGNOSIS — F0151 Vascular dementia with behavioral disturbance: Secondary | ICD-10-CM | POA: Diagnosis not present

## 2020-05-01 DIAGNOSIS — U071 COVID-19: Secondary | ICD-10-CM | POA: Diagnosis not present

## 2020-05-02 DIAGNOSIS — U071 COVID-19: Secondary | ICD-10-CM | POA: Diagnosis not present

## 2020-05-10 DIAGNOSIS — J449 Chronic obstructive pulmonary disease, unspecified: Secondary | ICD-10-CM | POA: Diagnosis not present

## 2020-05-10 DIAGNOSIS — K219 Gastro-esophageal reflux disease without esophagitis: Secondary | ICD-10-CM | POA: Diagnosis not present

## 2020-05-10 DIAGNOSIS — U071 COVID-19: Secondary | ICD-10-CM | POA: Diagnosis not present

## 2020-05-10 DIAGNOSIS — F0390 Unspecified dementia without behavioral disturbance: Secondary | ICD-10-CM | POA: Diagnosis not present

## 2020-05-10 DIAGNOSIS — E139 Other specified diabetes mellitus without complications: Secondary | ICD-10-CM | POA: Diagnosis not present

## 2020-05-16 ENCOUNTER — Emergency Department (HOSPITAL_COMMUNITY): Payer: Medicare Other

## 2020-05-16 ENCOUNTER — Inpatient Hospital Stay (HOSPITAL_COMMUNITY)
Admission: EM | Admit: 2020-05-16 | Discharge: 2020-05-19 | DRG: 193 | Disposition: A | Discharge status: Skilled Nursing Facility | Payer: Medicare Other | Source: Skilled Nursing Facility | Attending: Internal Medicine | Admitting: Internal Medicine

## 2020-05-16 ENCOUNTER — Encounter (HOSPITAL_COMMUNITY): Payer: Self-pay

## 2020-05-16 DIAGNOSIS — Z882 Allergy status to sulfonamides status: Secondary | ICD-10-CM

## 2020-05-16 DIAGNOSIS — Z87891 Personal history of nicotine dependence: Secondary | ICD-10-CM

## 2020-05-16 DIAGNOSIS — Z8616 Personal history of COVID-19: Secondary | ICD-10-CM | POA: Diagnosis not present

## 2020-05-16 DIAGNOSIS — E872 Acidosis, unspecified: Secondary | ICD-10-CM

## 2020-05-16 DIAGNOSIS — R400 Somnolence: Secondary | ICD-10-CM | POA: Diagnosis not present

## 2020-05-16 DIAGNOSIS — E785 Hyperlipidemia, unspecified: Secondary | ICD-10-CM | POA: Diagnosis present

## 2020-05-16 DIAGNOSIS — E11649 Type 2 diabetes mellitus with hypoglycemia without coma: Secondary | ICD-10-CM | POA: Diagnosis present

## 2020-05-16 DIAGNOSIS — J189 Pneumonia, unspecified organism: Secondary | ICD-10-CM | POA: Diagnosis not present

## 2020-05-16 DIAGNOSIS — J440 Chronic obstructive pulmonary disease with acute lower respiratory infection: Secondary | ICD-10-CM | POA: Diagnosis present

## 2020-05-16 DIAGNOSIS — M2550 Pain in unspecified joint: Secondary | ICD-10-CM | POA: Diagnosis not present

## 2020-05-16 DIAGNOSIS — J9621 Acute and chronic respiratory failure with hypoxia: Secondary | ICD-10-CM | POA: Diagnosis present

## 2020-05-16 DIAGNOSIS — R0602 Shortness of breath: Secondary | ICD-10-CM | POA: Diagnosis not present

## 2020-05-16 DIAGNOSIS — R253 Fasciculation: Secondary | ICD-10-CM | POA: Diagnosis not present

## 2020-05-16 DIAGNOSIS — J9601 Acute respiratory failure with hypoxia: Secondary | ICD-10-CM | POA: Diagnosis not present

## 2020-05-16 DIAGNOSIS — M1990 Unspecified osteoarthritis, unspecified site: Secondary | ICD-10-CM | POA: Diagnosis not present

## 2020-05-16 DIAGNOSIS — N390 Urinary tract infection, site not specified: Secondary | ICD-10-CM | POA: Diagnosis present

## 2020-05-16 DIAGNOSIS — Z881 Allergy status to other antibiotic agents status: Secondary | ICD-10-CM

## 2020-05-16 DIAGNOSIS — Z833 Family history of diabetes mellitus: Secondary | ICD-10-CM | POA: Diagnosis not present

## 2020-05-16 DIAGNOSIS — U071 COVID-19: Secondary | ICD-10-CM | POA: Diagnosis present

## 2020-05-16 DIAGNOSIS — I10 Essential (primary) hypertension: Secondary | ICD-10-CM | POA: Diagnosis present

## 2020-05-16 DIAGNOSIS — Z853 Personal history of malignant neoplasm of breast: Secondary | ICD-10-CM | POA: Diagnosis not present

## 2020-05-16 DIAGNOSIS — G9341 Metabolic encephalopathy: Secondary | ICD-10-CM | POA: Diagnosis not present

## 2020-05-16 DIAGNOSIS — E1165 Type 2 diabetes mellitus with hyperglycemia: Secondary | ICD-10-CM | POA: Diagnosis not present

## 2020-05-16 DIAGNOSIS — Z7951 Long term (current) use of inhaled steroids: Secondary | ICD-10-CM

## 2020-05-16 DIAGNOSIS — Z7401 Bed confinement status: Secondary | ICD-10-CM | POA: Diagnosis not present

## 2020-05-16 DIAGNOSIS — J159 Unspecified bacterial pneumonia: Secondary | ICD-10-CM | POA: Diagnosis present

## 2020-05-16 DIAGNOSIS — B9620 Unspecified Escherichia coli [E. coli] as the cause of diseases classified elsewhere: Secondary | ICD-10-CM | POA: Diagnosis present

## 2020-05-16 DIAGNOSIS — G8929 Other chronic pain: Secondary | ICD-10-CM | POA: Diagnosis not present

## 2020-05-16 DIAGNOSIS — Z66 Do not resuscitate: Secondary | ICD-10-CM | POA: Diagnosis present

## 2020-05-16 DIAGNOSIS — G309 Alzheimer's disease, unspecified: Secondary | ICD-10-CM

## 2020-05-16 DIAGNOSIS — Z8249 Family history of ischemic heart disease and other diseases of the circulatory system: Secondary | ICD-10-CM | POA: Diagnosis not present

## 2020-05-16 DIAGNOSIS — F0390 Unspecified dementia without behavioral disturbance: Secondary | ICD-10-CM | POA: Diagnosis present

## 2020-05-16 DIAGNOSIS — K219 Gastro-esophageal reflux disease without esophagitis: Secondary | ICD-10-CM | POA: Diagnosis present

## 2020-05-16 DIAGNOSIS — I959 Hypotension, unspecified: Secondary | ICD-10-CM | POA: Diagnosis not present

## 2020-05-16 DIAGNOSIS — J1282 Pneumonia due to coronavirus disease 2019: Secondary | ICD-10-CM | POA: Diagnosis present

## 2020-05-16 DIAGNOSIS — R404 Transient alteration of awareness: Secondary | ICD-10-CM | POA: Diagnosis not present

## 2020-05-16 DIAGNOSIS — R627 Adult failure to thrive: Secondary | ICD-10-CM | POA: Diagnosis present

## 2020-05-16 DIAGNOSIS — R0989 Other specified symptoms and signs involving the circulatory and respiratory systems: Secondary | ICD-10-CM | POA: Diagnosis not present

## 2020-05-16 DIAGNOSIS — Z823 Family history of stroke: Secondary | ICD-10-CM | POA: Diagnosis not present

## 2020-05-16 DIAGNOSIS — N179 Acute kidney failure, unspecified: Secondary | ICD-10-CM | POA: Diagnosis present

## 2020-05-16 DIAGNOSIS — G4489 Other headache syndrome: Secondary | ICD-10-CM | POA: Diagnosis not present

## 2020-05-16 DIAGNOSIS — D649 Anemia, unspecified: Secondary | ICD-10-CM | POA: Diagnosis present

## 2020-05-16 DIAGNOSIS — R4182 Altered mental status, unspecified: Secondary | ICD-10-CM | POA: Diagnosis not present

## 2020-05-16 DIAGNOSIS — J449 Chronic obstructive pulmonary disease, unspecified: Secondary | ICD-10-CM | POA: Diagnosis present

## 2020-05-16 DIAGNOSIS — M810 Age-related osteoporosis without current pathological fracture: Secondary | ICD-10-CM | POA: Diagnosis present

## 2020-05-16 DIAGNOSIS — E119 Type 2 diabetes mellitus without complications: Secondary | ICD-10-CM

## 2020-05-16 DIAGNOSIS — F0280 Dementia in other diseases classified elsewhere without behavioral disturbance: Secondary | ICD-10-CM

## 2020-05-16 DIAGNOSIS — R410 Disorientation, unspecified: Secondary | ICD-10-CM | POA: Diagnosis present

## 2020-05-16 DIAGNOSIS — R0902 Hypoxemia: Secondary | ICD-10-CM | POA: Diagnosis not present

## 2020-05-16 DIAGNOSIS — Z79899 Other long term (current) drug therapy: Secondary | ICD-10-CM

## 2020-05-16 DIAGNOSIS — J9811 Atelectasis: Secondary | ICD-10-CM | POA: Diagnosis not present

## 2020-05-16 DIAGNOSIS — R5381 Other malaise: Secondary | ICD-10-CM | POA: Diagnosis present

## 2020-05-16 DIAGNOSIS — Z7984 Long term (current) use of oral hypoglycemic drugs: Secondary | ICD-10-CM

## 2020-05-16 DIAGNOSIS — R062 Wheezing: Secondary | ICD-10-CM | POA: Diagnosis not present

## 2020-05-16 LAB — COMPREHENSIVE METABOLIC PANEL
ALT: 14 U/L (ref 0–44)
AST: 21 U/L (ref 15–41)
Albumin: 3.5 g/dL (ref 3.5–5.0)
Alkaline Phosphatase: 101 U/L (ref 38–126)
Anion gap: 16 — ABNORMAL HIGH (ref 5–15)
BUN: 85 mg/dL — ABNORMAL HIGH (ref 8–23)
CO2: 29 mmol/L (ref 22–32)
Calcium: 9.1 mg/dL (ref 8.9–10.3)
Chloride: 93 mmol/L — ABNORMAL LOW (ref 98–111)
Creatinine, Ser: 1.63 mg/dL — ABNORMAL HIGH (ref 0.44–1.00)
GFR, Estimated: 29 mL/min — ABNORMAL LOW (ref >60)
Glucose, Bld: 336 mg/dL — ABNORMAL HIGH (ref 70–99)
Potassium: 4.8 mmol/L (ref 3.5–5.1)
Sodium: 138 mmol/L (ref 135–145)
Total Bilirubin: 0.4 mg/dL (ref 0.3–1.2)
Total Protein: 7.2 g/dL (ref 6.5–8.1)

## 2020-05-16 LAB — CBC WITH DIFFERENTIAL/PLATELET
Abs Immature Granulocytes: 0.13 10*3/uL — ABNORMAL HIGH (ref 0.00–0.07)
Basophils Absolute: 0 10*3/uL (ref 0.0–0.1)
Basophils Relative: 0 %
Eosinophils Absolute: 0 10*3/uL (ref 0.0–0.5)
Eosinophils Relative: 0 %
HCT: 29.8 % — ABNORMAL LOW (ref 36.0–46.0)
Hemoglobin: 9.1 g/dL — ABNORMAL LOW (ref 12.0–15.0)
Immature Granulocytes: 1 %
Lymphocytes Relative: 7 %
Lymphs Abs: 0.7 10*3/uL (ref 0.7–4.0)
MCH: 29.1 pg (ref 26.0–34.0)
MCHC: 30.5 g/dL (ref 30.0–36.0)
MCV: 95.2 fL (ref 80.0–100.0)
Monocytes Absolute: 0.5 10*3/uL (ref 0.1–1.0)
Monocytes Relative: 5 %
Neutro Abs: 9.2 10*3/uL — ABNORMAL HIGH (ref 1.7–7.7)
Neutrophils Relative %: 87 %
Platelets: 390 10*3/uL (ref 150–400)
RBC: 3.13 MIL/uL — ABNORMAL LOW (ref 3.87–5.11)
RDW: 13.5 % (ref 11.5–15.5)
WBC: 10.6 10*3/uL — ABNORMAL HIGH (ref 4.0–10.5)
nRBC: 0 % (ref 0.0–0.2)

## 2020-05-16 LAB — LACTIC ACID, PLASMA
Lactic Acid, Venous: 5.5 mmol/L (ref 0.5–1.9)
Lactic Acid, Venous: 4.4 mmol/L (ref 0.5–1.9)

## 2020-05-16 LAB — URINALYSIS, ROUTINE W REFLEX MICROSCOPIC
Bilirubin Urine: NEGATIVE
Glucose, UA: NEGATIVE mg/dL
Hgb urine dipstick: NEGATIVE
Ketones, ur: NEGATIVE mg/dL
Nitrite: NEGATIVE
Protein, ur: NEGATIVE mg/dL
RBC / HPF: >50 RBC/hpf — ABNORMAL HIGH (ref 0–5)
Specific Gravity, Urine: 1.013 (ref 1.005–1.030)
WBC, UA: >50 WBC/hpf — ABNORMAL HIGH (ref 0–5)
pH: 5 (ref 5.0–8.0)

## 2020-05-16 LAB — C-REACTIVE PROTEIN: CRP: 0.6 mg/dL (ref <1.0)

## 2020-05-16 LAB — BLOOD GAS, VENOUS
Acid-Base Excess: 3.7 mmol/L — ABNORMAL HIGH (ref 0.0–2.0)
Bicarbonate: 29.8 mmol/L — ABNORMAL HIGH (ref 20.0–28.0)
FIO2: 21
O2 Saturation: 90.8 %
Patient temperature: 98.6
pCO2, Ven: 57.2 mmHg (ref 44.0–60.0)
pH, Ven: 7.337 (ref 7.250–7.430)
pO2, Ven: 67.3 mmHg — ABNORMAL HIGH (ref 32.0–45.0)

## 2020-05-16 LAB — FERRITIN: Ferritin: 72 ng/mL (ref 11–307)

## 2020-05-16 LAB — FIBRINOGEN: Fibrinogen: 526 mg/dL — ABNORMAL HIGH (ref 210–475)

## 2020-05-16 LAB — D-DIMER, QUANTITATIVE: D-Dimer, Quant: 0.76 ug/mL-FEU — ABNORMAL HIGH (ref 0.00–0.50)

## 2020-05-16 LAB — LACTATE DEHYDROGENASE: LDH: 145 U/L (ref 98–192)

## 2020-05-16 LAB — SARS CORONAVIRUS 2 BY RT PCR (HOSPITAL ORDER, PERFORMED IN ~~LOC~~ HOSPITAL LAB): SARS Coronavirus 2: NEGATIVE

## 2020-05-16 LAB — BRAIN NATRIURETIC PEPTIDE: B Natriuretic Peptide: 41.5 pg/mL (ref 0.0–100.0)

## 2020-05-16 LAB — PROCALCITONIN: Procalcitonin: 0.17 ng/mL

## 2020-05-16 LAB — AMMONIA: Ammonia: 10 umol/L (ref 9–35)

## 2020-05-16 LAB — TRIGLYCERIDES: Triglycerides: 136 mg/dL (ref <150)

## 2020-05-16 LAB — TROPONIN I (HIGH SENSITIVITY): Troponin I (High Sensitivity): 5 ng/L (ref <18)

## 2020-05-16 MED ORDER — SODIUM CHLORIDE 0.9 % IV BOLUS
1000.0000 mL | Freq: Once | INTRAVENOUS | Status: AC
  Administered 2020-05-16: 1000 mL via INTRAVENOUS

## 2020-05-16 MED ORDER — LEVOFLOXACIN IN D5W 500 MG/100ML IV SOLN: 500.0000 mg | INTRAVENOUS | Status: DC

## 2020-05-16 MED ORDER — LEVOFLOXACIN IN D5W 750 MG/150ML IV SOLN
750.0000 mg | INTRAVENOUS | Status: DC
  Administered 2020-05-16 – 2020-05-18 (×2): 750 mg via INTRAVENOUS
  Filled 2020-05-16 (×3): qty 150

## 2020-05-16 MED ORDER — METHYLPREDNISOLONE SODIUM SUCC 125 MG IJ SOLR
50.0000 mg | Freq: Two times a day (BID) | INTRAMUSCULAR | Status: DC
  Administered 2020-05-16: 50 mg via INTRAVENOUS
  Filled 2020-05-16: qty 2

## 2020-05-16 MED ORDER — SODIUM CHLORIDE 0.9 % IV SOLN: INTRAVENOUS | Status: DC

## 2020-05-16 NOTE — ED Notes (Signed)
Nurse notified O2 was 88

## 2020-05-16 NOTE — ED Notes (Signed)
Date and time results received: 05/16/20 1:25 PM (use smartphrase ".now" to insert current time)  Test: Lactic Acid Critical Value: 5.5  Name of Provider Notified: Roslynn Amble

## 2020-05-16 NOTE — ED Notes (Signed)
Pt O2 dropped to 88% RA. Pt placed back on [place], 1L O2.

## 2020-05-16 NOTE — ED Triage Notes (Signed)
Patient arrived via [facility] from [facility].   Covid-19 positive on Jan 17th and more altered than normal the past couple of days per daughter and staff.   DNR per EMS   Daughter worried because patient does not recognize her anymore.   Patient on 2 liters [place] 98% (EMS unsure how long patient has been on 02)  Lungs junky per ems    HR-90 BP-138/88 CBG-381  Hx:DM and COPD   Previous steroid use

## 2020-05-16 NOTE — Progress Notes (Signed)
PHARMACY NOTE:  ANTIMICROBIAL RENAL DOSAGE ADJUSTMENT  Current antimicrobial regimen includes a mismatch between antimicrobial dosage and estimated renal function.  As per policy approved by the Pharmacy & Therapeutics and Medical Executive Committees, the antimicrobial dosage will be adjusted accordingly.  Current antimicrobial dosage:  levaquin 500 mg IV q24h  Indication: PNA and UTI  Renal Function:  Estimated Creatinine Clearance: 22 mL/min (A) (by C-G formula based on SCr of 1.63 mg/dL (H)). []      On intermittent HD, scheduled: []      On CRRT    Antimicrobial dosage has been changed to:  levaquin 750 mg IV q48h   Thank you for allowing pharmacy to be a part of this patient's care.  Lynelle Doctor, [facility] 05/16/2020 7:43 PM

## 2020-05-16 NOTE — ED Provider Notes (Signed)
[facility] DEPT Provider Note   CSN: [id number] Arrival date & time: 05/16/20  1150     History Chief Complaint  Patient presents with  . Covid Positive  . Altered Mental Status    Candice Miller is a 87 y.o. female.  Presents to ER with concern for worsening mental status in the setting of known COVID-19.  Resides at [facility], [place] + January 17.  History of dementia, more altered than normal.  Daughter states her primary concern was patient's increasing confusion today.     History limited due to dementia, mental status change.  HPI     Past Medical History:  Diagnosis Date  . Anemia   . Breast cancer ([place]) 1994  . COPD (chronic obstructive pulmonary disease) ([place])   . Diabetes mellitus without complication ([place])    diet controlled vs. on glimepride  . Hypertension   . NPH (normal pressure hydrocephalus) ([place])   . Osteoporosis     Patient Active Problem List   Diagnosis Date Noted  . Pneumonia due to COVID-19 virus 05/16/2020  . Chest pain 01/17/2018  . Acute metabolic encephalopathy [id number]  . Compression fracture of L2 lumbar vertebra ([place]) 11/03/2017  . Hyperlipidemia 09/20/2017  . Paroxysmal SVT (supraventricular tachycardia) ([place]) 08/17/2017  . Chronic back pain 08/17/2017  . Encounter for palliative care   . Goals of care, counseling/discussion   . Acute urinary retention 08/13/2017  . Frequent falls 08/13/2017  . Pressure injury of skin 08/13/2017  . CAP (community acquired pneumonia) 08/12/2017  . Acute respiratory failure with hypoxia ([place]) 06/20/2017  . Accident due to mechanical fall without injury 06/20/2017  . Dementia without behavioral disturbance ([place]) 06/20/2017  . Confusion 06/20/2017  . Hyperlipidemia associated with type 2 diabetes mellitus ([place]) 06/20/2017  . GERD (gastroesophageal reflux disease) 06/20/2017  . Nodule of middle lobe of right lung 06/20/2017  . COPD with acute exacerbation ([place])  06/15/2017  . Fall at home, initial encounter 06/15/2017  . COPD (chronic obstructive pulmonary disease) ([place]) 12/31/2016  . Diabetes mellitus without complication ([place]) [id number]  . History of breast cancer 12/31/2016  . HTN (hypertension) 12/31/2016  . Hemorrhoids 12/31/2016  . Nodule of left lung 12/31/2016  . Lower GI bleed 12/30/2016  . Memory loss 10/22/2012    Past Surgical History:  Procedure Laterality Date  . CHOLECYSTECTOMY    . COLONOSCOPY WITH PROPOFOL N/A 01/01/2017   Procedure: COLONOSCOPY WITH PROPOFOL;  Surgeon: Laurence Spates, MD;  Location: [facility] ENDOSCOPY;  Service: Endoscopy;  Laterality: N/A;  . MASTECTOMY  1994  . TUBAL LIGATION       OB History   No obstetric history on file.     Family History  Problem Relation Age of Onset  . Hypertension Mother   . Diabetes Mother   . Heart failure Mother   . Stroke Mother   . Hypertension Father   . Diabetes Father   . Heart failure Father   . Stroke Father     Social History   Tobacco Use  . Smoking status: Never Smoker  . Smokeless tobacco: Former Systems developer    Types: Snuff, Chew  . Tobacco comment: lived with smokers most of her life; oral tobacco for 10 years?  Vaping Use  . Vaping Use: Never used  Substance Use Topics  . Alcohol use: No  . Drug use: No    Home Medications Prior to Admission medications   Medication Sig Start Date End Date Taking? Authorizing Provider  acetaminophen (  TYLENOL) 500 MG tablet Take 1,000 mg by mouth every 6 (six) hours.     [provider]  albuterol (PROVENTIL HFA;VENTOLIN HFA) 108 (90 Base) MCG/ACT inhaler Inhale 2 puffs into the lungs every 6 (six) hours as needed for wheezing or shortness of breath.     [provider]  atorvastatin (LIPITOR) 10 MG tablet Take 10 mg by mouth daily.    [provider]  budesonide-formoterol (SYMBICORT) 80-4.5 MCG/ACT inhaler Inhale 2 puffs into the lungs 2 (two) times daily.    [provider]   calcium carbonate (CALCIUM 600) 1500 (600 Ca) MG TABS tablet Take 1,200 mg by mouth daily.    [provider]  cholecalciferol (VITAMIN D) 1000 units tablet Take 1,000 Units by mouth daily.    [provider]  diltiazem (CARDIZEM CD) 180 MG 24 hr capsule Take 180 mg by mouth daily.    [provider]  famotidine (PEPCID) 20 MG tablet Take 20 mg by mouth 2 (two) times daily. 09/12/19   [provider]  fexofenadine (ALLEGRA) 180 MG tablet Take 180 mg by mouth as needed for allergies.     [provider]  fluticasone (FLONASE) 50 MCG/ACT nasal spray Place 2 sprays into both nostrils daily.    [provider]  furosemide (LASIX) 40 MG tablet Take 40 mg by mouth daily as needed for fluid or edema.     [provider]  gabapentin (NEURONTIN) 300 MG capsule Take 300 mg by mouth at bedtime.     [provider]  glimepiride (AMARYL) 1 MG tablet Take 1 mg by mouth See admin instructions. Take 1 mg by mouth once a day only if BGL is >150, per provider    [provider]  guaiFENesin (MUCINEX) 600 MG 12 hr tablet Take 600 mg by mouth daily as needed.    [provider]  HUMALOG KWIKPEN 100 UNIT/ML KwikPen Inject into the skin 3 (three) times daily.  01/03/20   [provider]  ipratropium-albuterol (DUONEB) 0.5-2.5 (3) MG/3ML SOLN Take 3 mLs by nebulization 4 (four) times daily.     [provider]  iron polysaccharides (NIFEREX) 150 MG capsule Take 150 mg by mouth 2 (two) times daily.     [provider]  l-methylfolate-B6-B12 (METANX) 3-35-2 MG TABS tablet Take 1 tablet by mouth 2 (two) times daily.     [provider]  Lidocaine (ASPERCREME LIDOCAINE) 4 % PTCH Apply 1 patch topically 2 (two) times daily.     [provider]  Lidocaine-Glycerin (PREPARATION H) 5-14.4 % CREA Place 1 application rectally as needed (Constipation). Unsupervised  Self administration     [provider]  lisinopril (PRINIVIL,ZESTRIL) 10 MG tablet Take 10 mg by mouth daily.    [provider]  Magnesium 250 MG TABS Take 250 mg by mouth See admin instructions. Every 24 hours as needed for constipation "May take at bedtime PRN    [provider]  memantine (NAMENDA) 10 MG tablet Take 10 mg by mouth 2 (two) times daily.    [provider]  metFORMIN (GLUCOPHAGE-XR) 500 MG 24 hr tablet Take 500 mg by mouth daily with breakfast.    [provider]  neomycin-bacitracin-polymyxin (NEOSPORIN) ointment Apply 1 application topically daily. Ointment of choice and band-aid change daily until healed every day shift for wound care    [provider]  nitroGLYCERIN (NITROSTAT) 0.4 MG SL tablet Place 0.4 mg under the tongue every 5 (five)  minutes as needed for chest pain.    [provider]  Omega-3 Fatty Acids (FISH OIL) 1000 MG CAPS Take 1,000 mg by mouth daily.    [provider]  ondansetron (ZOFRAN) 4 MG tablet Take 1 tablet (4 mg total) by mouth every 6 (six) hours as needed for nausea. 08/17/17   Debbe Odea, MD  OXYGEN Inhale 2 L into the lungs continuous.    [provider]  polyethylene glycol (MIRALAX / GLYCOLAX) packet Take 17 g by mouth daily. 01/01/17   Florencia Reasons, MD  ranitidine (ZANTAC) 150 MG tablet Take 150 mg by mouth 2 (two) times daily.     [provider]  sodium chloride (OCEAN) 0.65 % SOLN nasal spray Place 1 spray into both nostrils as needed for congestion. 08/17/17   Debbe Odea, MD  sucralfate (CARAFATE) 1 g tablet Take 1 g by mouth 2 (two) times daily.    [provider]  traZODone (DESYREL) 50 MG tablet Take 50 mg by mouth daily as needed for sleep.  10/28/17   [provider]    Allergies    Adhesive [tape], Macrodantin [nitrofurantoin], Sulfa antibiotics, and Keflex [cephalexin]  Review of Systems   Review of Systems  Unable to perform ROS: Mental status change     Physical Exam Updated Vital Signs BP (!) 117/57   Pulse 80   Temp 97.7 F (36.5 C) (Oral)   Resp 15   SpO2 98%   Physical Exam Vitals and nursing note reviewed.  Constitutional:      Appearance: She is well-developed and well-nourished.     Comments: Elderly, frail  HENT:     Head: Normocephalic and atraumatic.  Eyes:     Conjunctiva/sclera: Conjunctivae normal.  Cardiovascular:     Rate and Rhythm: Normal rate and regular rhythm.     Pulses: Normal pulses.  Pulmonary:     Comments: Rhonchorous breath sounds bilaterally, mild tachypnea but no distress Abdominal:     Palpations: Abdomen is soft.     Tenderness: There is no abdominal tenderness.  Musculoskeletal:        General: No deformity, signs of injury or edema.     Cervical back: Neck supple.  Skin:    General: Skin is warm and dry.     Capillary Refill: Capillary refill takes less than 2 seconds.  Neurological:     General: No focal deficit present.  Psychiatric:        Mood and Affect: Mood and affect and mood normal.     ED Results / Procedures / Treatments   Labs (all labs ordered are listed, but only abnormal results are displayed) Labs Reviewed  LACTIC ACID, PLASMA - Abnormal; Notable for the following components:      Result Value   Lactic Acid, Venous 5.5 (*)    All other components within normal limits  CBC WITH DIFFERENTIAL/PLATELET - Abnormal; Notable for the following components:   WBC 10.6 (*)    RBC 3.13 (*)    Hemoglobin 9.1 (*)    HCT 29.8 (*)    Neutro Abs 9.2 (*)    Abs Immature Granulocytes 0.13 (*)    All other components within normal limits  COMPREHENSIVE METABOLIC PANEL - Abnormal; Notable for the following components:   Chloride 93 (*)    Glucose, Bld 336 (*)    BUN 85 (*)    Creatinine, Ser 1.63 (*)    GFR, Estimated 29 (*)    Anion gap 16 (*)  All other components within normal limits  D-DIMER, QUANTITATIVE (NOT AT [facility]) - Abnormal; Notable for the following  components:   D-Dimer, Quant 0.76 (*)    All other components within normal limits  FIBRINOGEN - Abnormal; Notable for the following components:   Fibrinogen 526 (*)    All other components within normal limits  BLOOD GAS, VENOUS - Abnormal; Notable for the following components:   pO2, Ven 67.3 (*)    Bicarbonate 29.8 (*)    Acid-Base Excess 3.7 (*)    All other components within normal limits  SARS CORONAVIRUS 2 BY RT PCR (HOSPITAL ORDER, [facility])  PROCALCITONIN  LACTATE DEHYDROGENASE  FERRITIN  TRIGLYCERIDES  C-REACTIVE PROTEIN  AMMONIA  LACTIC ACID, PLASMA  URINALYSIS, ROUTINE W REFLEX MICROSCOPIC  TROPONIN I (HIGH SENSITIVITY)    EKG None  Radiology DG Chest Port 1 View  Result Date: 05/16/2020 CLINICAL DATA:  Mental status changes with known COVID-19 EXAM: PORTABLE CHEST 1 VIEW COMPARISON:  10/06/2019 FINDINGS: Midline trachea. Numerous leads and wires project over the chest. Normal heart size. Atherosclerosis in the transverse aorta. No pleural effusion or pneumothorax. Lung volumes are low. Basilar and peripheral predominant pulmonary interstitial thickening. Worsened left base aeration with suggestion of superimposed airspace disease, obscuring the left hemidiaphragm. IMPRESSION: Interstitial lung disease, making evaluation for COVID-19 pneumonia challenging. Low lung volumes. New obscuration of the left hemidiaphragm, for which superimposed infection or aspiration are concerns. Atelectasis in the setting of volume loss could look similar. Aortic Atherosclerosis (ICD10-I70.0). Electronically Signed   By: Abigail Miyamoto M.D.   On: 05/16/2020 13:41    Procedures .Critical Care Performed by: Lucrezia Starch, MD Authorized by: Lucrezia Starch, MD   Critical care provider statement:    Critical care time (minutes):  45   Critical care was necessary to treat or prevent imminent or life-threatening deterioration of the following conditions:   Respiratory failure and renal failure   Critical care was time spent personally by me on the following activities:  Discussions with consultants, evaluation of patient's response to treatment, examination of patient, ordering and performing treatments and interventions, ordering and review of laboratory studies, ordering and review of radiographic studies, pulse oximetry, re-evaluation of patient's condition, obtaining history from patient or surrogate and review of old charts     Medications Ordered in ED Medications  methylPREDNISolone sodium succinate (SOLU-MEDROL) 125 mg/2 mL injection 50 mg (50 mg Intravenous Given 05/16/20 1443)  sodium chloride 0.9 % bolus 1,000 mL (1,000 mLs Intravenous New Bag/Given 05/16/20 1341)    ED Course  I have reviewed the triage vital signs and the nursing notes.  Pertinent labs & imaging results that were available during my care of the patient were reviewed by me and considered in my medical decision making (see chart for details).  Clinical Course as of 05/16/20 1521  Wed May 16, 2020  1330 Lactic Acid, Venous(!!): 5.5 [RD]  1330 Procalcitonin: 0.17 [RD]  1330 Creatinine(!): 1.63 [RD]    Clinical Course User Index [RD] Lucrezia Starch, MD   MDM Rules/Calculators/A&P                         87 year old lady presents to ER with concern for increased confusion in setting of known COVID-19.  On exam patient did have rhonchorous breath sounds, mild hypoxia but otherwise stable vitals.  Blood work noted for lactic acidosis, AKI.  No fever at present, procalcitonin is only  minimally elevated, lower suspicion for bacterial sepsis at this time.  Suspect her presentation currently is related to Covid, Covid pneumonia and dehydration secondary to be Covid infection.  Will defer antibiotics at this time.  Will provide some rehydration but cautious not to provide too much fluid in setting of COVID-19.  Will consult hospitalist for admission.  Final Clinical  Impression(s) / ED Diagnoses Final diagnoses:  AKI (acute kidney injury) ([place])  Acute respiratory failure with hypoxia ([facility])  Lactic acid acidosis    Rx / DC Orders ED Discharge Orders    None       Lucrezia Starch, MD 05/16/20 1521

## 2020-05-16 NOTE — H&P (Addendum)
History and Physical    Candice Miller DOB: [birth year] DOA: 05/16/2020  PCP: Jodi Marble, MD  Patient coming from: [facility]  Chief Complaint: altered mental status  HPI: Candice Miller is a 87 y.o. female with medical history significant of  COPD on chronic 2L O2, dementia, DM2. Presenting with altered mental status. History per daughter as patient is confused. The daughter reports that the patient was COVID+ on 04/30/20. She was placed in a COVID Unit for a 10-day quarantine and was given an MAB infusion. She was also started on an unspecified abx for PNA. She completed treatment and quarantine. However, she started having problems with confusion and poor appetite. She would scream out for help and was unable to recognize familiar faces. She was examined again by the medical staff at her facility this morning. Her lung exam was poor so she was sent to the ED.   ED Course: CXR showed PNA. She was placed on oxygen. She was given fluids for her elevated lactic acid. TRH was called for admission.   Review of Systems: Unable to obtain d/t mentation.  PMHx Past Medical History:  Diagnosis Date  . Anemia   . Breast cancer ([place]) 1994  . COPD (chronic obstructive pulmonary disease) ([place])   . Diabetes mellitus without complication ([place])    diet controlled vs. on glimepride  . Hypertension   . NPH (normal pressure hydrocephalus) ([place])   . Osteoporosis     PSHx Past Surgical History:  Procedure Laterality Date  . CHOLECYSTECTOMY    . COLONOSCOPY WITH PROPOFOL N/A 01/01/2017   Procedure: COLONOSCOPY WITH PROPOFOL;  Surgeon: Laurence Spates, MD;  Location: [facility];  Service: Endoscopy;  Laterality: N/A;  . MASTECTOMY  1994  . TUBAL LIGATION      SocHx  reports that she has never smoked. She has quit using smokeless tobacco.  Her smokeless tobacco use included snuff and chew. She reports that she does not drink alcohol and does not use drugs.  Allergies   Allergen Reactions  . Adhesive [Tape] Other (See Comments)    SKIN IS FRAGILE AND WILL TEAR VERY EASILY!!!  . Macrodantin [Nitrofurantoin] Other (See Comments)    "Made me hurt all over"  . Sulfa Antibiotics Swelling    Swelling of the throat, but patient denies that it impaired her breathing  . Keflex [Cephalexin] Rash    FamHx Family History  Problem Relation Age of Onset  . Hypertension Mother   . Diabetes Mother   . Heart failure Mother   . Stroke Mother   . Hypertension Father   . Diabetes Father   . Heart failure Father   . Stroke Father     Prior to Admission medications   Medication Sig Start Date End Date Taking? Authorizing Provider  acetaminophen (TYLENOL) 500 MG tablet Take 1,000 mg by mouth every 6 (six) hours.     [provider]  albuterol (PROVENTIL HFA;VENTOLIN HFA) 108 (90 Base) MCG/ACT inhaler Inhale 2 puffs into the lungs every 6 (six) hours as needed for wheezing or shortness of breath.     [provider]  atorvastatin (LIPITOR) 10 MG tablet Take 10 mg by mouth daily.    [provider]  budesonide-formoterol (SYMBICORT) 80-4.5 MCG/ACT inhaler Inhale 2 puffs into the lungs 2 (two) times daily.    [provider]  calcium carbonate (CALCIUM 600) 1500 (600 Ca) MG TABS tablet Take 1,200 mg by mouth daily.    [provider]  cholecalciferol (VITAMIN D) 1000 units tablet Take 1,000 Units by mouth daily.    [provider]  diltiazem (CARDIZEM CD) 180 MG 24 hr capsule Take 180 mg by mouth daily.    [provider]  famotidine (PEPCID) 20 MG tablet Take 20 mg by mouth 2 (two) times daily. 09/12/19   [provider]  fexofenadine (ALLEGRA) 180 MG tablet Take 180 mg by mouth as needed for allergies.     [provider]  fluticasone (FLONASE) 50 MCG/ACT nasal spray Place 2 sprays into both nostrils daily.    [provider]  furosemide (LASIX) 40 MG tablet Take 40 mg by mouth  daily as needed for fluid or edema.     [provider]  gabapentin (NEURONTIN) 300 MG capsule Take 300 mg by mouth at bedtime.     [provider]  glimepiride (AMARYL) 1 MG tablet Take 1 mg by mouth See admin instructions. Take 1 mg by mouth once a day only if BGL is >150, per provider    [provider]  guaiFENesin (MUCINEX) 600 MG 12 hr tablet Take 600 mg by mouth daily as needed.    [provider]  HUMALOG KWIKPEN 100 UNIT/ML KwikPen Inject into the skin 3 (three) times daily.  01/03/20   [provider]  ipratropium-albuterol (DUONEB) 0.5-2.5 (3) MG/3ML SOLN Take 3 mLs by nebulization 4 (four) times daily.     [provider]  iron polysaccharides (NIFEREX) 150 MG capsule Take 150 mg by mouth 2 (two) times daily.     [provider]  l-methylfolate-B6-B12 (METANX) 3-35-2 MG TABS tablet Take 1 tablet by mouth 2 (two) times daily.     [provider]  Lidocaine (ASPERCREME LIDOCAINE) 4 % PTCH Apply 1 patch topically 2 (two) times daily.     [provider]  Lidocaine-Glycerin (PREPARATION H) 5-14.4 % CREA Place 1 application rectally as needed (Constipation). Unsupervised  Self administration     [provider]  lisinopril (PRINIVIL,ZESTRIL) 10 MG tablet Take 10 mg by mouth daily.    [provider]  Magnesium 250 MG TABS Take 250 mg by mouth See admin instructions. Every 24 hours as needed for constipation "May take at bedtime PRN    [provider]  memantine (NAMENDA) 10 MG tablet Take 10 mg by mouth 2 (two) times daily.    [provider]  metFORMIN (GLUCOPHAGE-XR) 500 MG 24 hr tablet Take 500 mg by mouth daily with breakfast.    [provider]  neomycin-bacitracin-polymyxin (NEOSPORIN) ointment Apply 1 application topically daily. Ointment of choice and band-aid change daily until healed every day shift for wound care    [provider]  nitroGLYCERIN  (NITROSTAT) 0.4 MG SL tablet Place 0.4 mg under the tongue every 5 (five) minutes as needed for chest pain.    [provider]  Omega-3 Fatty Acids (FISH OIL) 1000 MG CAPS Take 1,000 mg by mouth daily.    [provider]  ondansetron (ZOFRAN) 4 MG tablet Take 1 tablet (4 mg total) by mouth every 6 (six) hours as needed for nausea. 08/17/17   Calvert Cantorizwan, Saima, MD  OXYGEN Inhale 2 L into the lungs continuous.    [provider]  polyethylene glycol (MIRALAX / GLYCOLAX) packet Take 17 g by mouth daily. 01/01/17   Albertine GratesXu, Fang, MD  ranitidine (ZANTAC) 150 MG tablet Take 150 mg by mouth 2 (two) times daily.     [provider]  sodium  chloride (OCEAN) 0.65 % SOLN nasal spray Place 1 spray into both nostrils as needed for congestion. 08/17/17   Debbe Odea, MD  sucralfate (CARAFATE) 1 g tablet Take 1 g by mouth 2 (two) times daily.    [provider]  traZODone (DESYREL) 50 MG tablet Take 50 mg by mouth daily as needed for sleep.  10/28/17   [provider]    Physical Exam: Vitals:   05/16/20 1207 05/16/20 1258  BP: 135/63 138/60  Pulse: 92 95  Resp: 17 18  Temp: 97.7 F (36.5 C)   TempSrc: Oral   SpO2: 97% (!) 88%    General: 87 y.o. female resting in bed in NAD Eyes: PERRL, normal sclera ENMT: Nares patent w/o discharge, orophaynx clear, dentition normal, ears w/o discharge/lesions/ulcers Neck: Supple, trachea midline Cardiovascular: RRR, +S1, S2, no m/g/r, equal pulses throughout Respiratory: decreased at bases, scattered rhonchi, normal WOB on 2L [place] GI: BS+, NDNT, no masses noted, no organomegaly noted MSK: No e/c/c Neuro: A&O x 2, no focal deficits Psyc: Interaction is slow and confused, flat affect, calm/cooperative  Labs on Admission: I have personally reviewed following labs and imaging studies  CBC: Recent Labs  Lab 05/16/20 1240  WBC 10.6*  NEUTROABS 9.2*  HGB 9.1*  HCT 29.8*  MCV 95.2  PLT 277   Basic Metabolic  Panel: Recent Labs  Lab 05/16/20 1240  NA 138  K 4.8  CL 93*  CO2 29  GLUCOSE 336*  BUN 85*  CREATININE 1.63*  CALCIUM 9.1   GFR: CrCl cannot be calculated (Unknown ideal weight.). Liver Function Tests: Recent Labs  Lab 05/16/20 1240  AST 21  ALT 14  ALKPHOS 101  BILITOT 0.4  PROT 7.2  ALBUMIN 3.5   No results for input(s): LIPASE, AMYLASE in the last 168 hours. Recent Labs  Lab 05/16/20 1240  AMMONIA 10   Coagulation Profile: No results for input(s): INR, PROTIME in the last 168 hours. Cardiac Enzymes: No results for input(s): CKTOTAL, CKMB, CKMBINDEX, TROPONINI in the last 168 hours. BNP (last 3 results) No results for input(s): PROBNP in the last 8760 hours. HbA1C: No results for input(s): HGBA1C in the last 72 hours. CBG: No results for input(s): GLUCAP in the last 168 hours. Lipid Profile: Recent Labs    05/16/20 1240  TRIG 136   Thyroid Function Tests: No results for input(s): TSH, T4TOTAL, FREET4, T3FREE, THYROIDAB in the last 72 hours. Anemia Panel: Recent Labs    05/16/20 1240  FERRITIN 72   Urine analysis:    Component Value Date/Time   COLORURINE STRAW (A) 11/02/2017 [place] 11/02/2017 1952   LABSPEC 1.006 11/02/2017 1952   PHURINE 8.0 11/02/2017 1952   GLUCOSEU NEGATIVE 11/02/2017 1952   HGBUR NEGATIVE 11/02/2017 1952   BILIRUBINUR NEGATIVE 11/02/2017 1952   KETONESUR NEGATIVE 11/02/2017 1952   PROTEINUR NEGATIVE 11/02/2017 1952   NITRITE NEGATIVE 11/02/2017 1952   LEUKOCYTESUR NEGATIVE 11/02/2017 1952    Radiological Exams on Admission: DG Chest Port 1 View  Result Date: 05/16/2020 CLINICAL DATA:  Mental status changes with known COVID-19 EXAM: PORTABLE CHEST 1 VIEW COMPARISON:  10/06/2019 FINDINGS: Midline trachea. Numerous leads and wires project over the chest. Normal heart size. Atherosclerosis in the transverse aorta. No pleural effusion or pneumothorax. Lung volumes are low. Basilar and peripheral  predominant pulmonary interstitial thickening. Worsened left base aeration with suggestion of superimposed airspace disease, obscuring the left hemidiaphragm. IMPRESSION: Interstitial lung disease, making evaluation for COVID-19 pneumonia challenging.  Low lung volumes. New obscuration of the left hemidiaphragm, for which superimposed infection or aspiration are concerns. Atelectasis in the setting of volume loss could look similar. Aortic Atherosclerosis (ICD10-I70.0). Electronically Signed   By: Abigail Miyamoto M.D.   On: 05/16/2020 13:41   Assessment/Plan AMS Hx of dementia     - admit to inpatient, med-surg     - CTH ordered     - follow UA/UCx     - fluids     - likely multifactorial (infection, dehydration) on top of her chronic condition  AKI Lactic acidosis     - fluids, follow UOP  PNA (COVID 19 vs bacterial) Recent COVID19 infection COPD w/ chronic hypoxic respiratory failure on chronic 2L [place]     - recent COVID infection and tx w/ MAB per dtr; as well as covered by abx     - procal is mildly elevated at 0.19     - has history of COPD w/ chronic O2 use; ABG w/o retention, pO2 is 67; continue O2 support, titrate to SpO2 of 90%     - inhalers     - will start lvq (she has a ceph allergy)     - no steroids, remdes for now; follow     - isolation through 05/20/20  DM2     - SSI, DM diet, glucose checks  Normocytic anemia     - no evidence of bleed, follow  Prossible UTI     - UA shows leukocyte positive; UCx sent. Will be covered with lvq for PNA above  DVT prophylaxis: heparin  Code Status: DNR, confirmed w/ dtr  Family Communication: W/ dtr by phone  Consults called: None   Status is: Inpatient  Remains inpatient appropriate because:Inpatient level of care appropriate due to severity of illness   Dispo: The patient is from: SNF              Anticipated d/c is to: SNF              Anticipated d/c date is: > 3 days              Patient currently is not medically stable  to d/c.   Difficult to place patient No  Time spent coordinating admission: 75 minutes  [facility]  If 7PM-7AM, please contact night-coverage [email or web address]  05/16/2020, 2:05 PM

## 2020-05-17 ENCOUNTER — Inpatient Hospital Stay (HOSPITAL_COMMUNITY): Payer: Medicare Other

## 2020-05-17 ENCOUNTER — Other Ambulatory Visit: Payer: Self-pay

## 2020-05-17 DIAGNOSIS — G9341 Metabolic encephalopathy: Secondary | ICD-10-CM

## 2020-05-17 DIAGNOSIS — N390 Urinary tract infection, site not specified: Secondary | ICD-10-CM | POA: Diagnosis present

## 2020-05-17 LAB — CBC WITH DIFFERENTIAL/PLATELET
Abs Immature Granulocytes: 0.15 10*3/uL — ABNORMAL HIGH (ref 0.00–0.07)
Basophils Absolute: 0 10*3/uL (ref 0.0–0.1)
Basophils Relative: 0 %
Eosinophils Absolute: 0 10*3/uL (ref 0.0–0.5)
Eosinophils Relative: 0 %
HCT: 28.5 % — ABNORMAL LOW (ref 36.0–46.0)
Hemoglobin: 8.7 g/dL — ABNORMAL LOW (ref 12.0–15.0)
Immature Granulocytes: 1 %
Lymphocytes Relative: 8 %
Lymphs Abs: 0.8 10*3/uL (ref 0.7–4.0)
MCH: 29.1 pg (ref 26.0–34.0)
MCHC: 30.5 g/dL (ref 30.0–36.0)
MCV: 95.3 fL (ref 80.0–100.0)
Monocytes Absolute: 0.3 10*3/uL (ref 0.1–1.0)
Monocytes Relative: 3 %
Neutro Abs: 9.7 10*3/uL — ABNORMAL HIGH (ref 1.7–7.7)
Neutrophils Relative %: 88 %
Platelets: 367 10*3/uL (ref 150–400)
RBC: 2.99 MIL/uL — ABNORMAL LOW (ref 3.87–5.11)
RDW: 13.6 % (ref 11.5–15.5)
WBC: 11 10*3/uL — ABNORMAL HIGH (ref 4.0–10.5)
nRBC: 0 % (ref 0.0–0.2)

## 2020-05-17 LAB — GLUCOSE, CAPILLARY
Glucose-Capillary: 292 mg/dL — ABNORMAL HIGH (ref 70–99)
Glucose-Capillary: 220 mg/dL — ABNORMAL HIGH (ref 70–99)
Glucose-Capillary: 195 mg/dL — ABNORMAL HIGH (ref 70–99)
Glucose-Capillary: 116 mg/dL — ABNORMAL HIGH (ref 70–99)
Glucose-Capillary: 156 mg/dL — ABNORMAL HIGH (ref 70–99)

## 2020-05-17 LAB — COMPREHENSIVE METABOLIC PANEL
ALT: 14 U/L (ref 0–44)
AST: 20 U/L (ref 15–41)
Albumin: 3.4 g/dL — ABNORMAL LOW (ref 3.5–5.0)
Alkaline Phosphatase: 96 U/L (ref 38–126)
Anion gap: 17 — ABNORMAL HIGH (ref 5–15)
BUN: 81 mg/dL — ABNORMAL HIGH (ref 8–23)
CO2: 26 mmol/L (ref 22–32)
Calcium: 8.8 mg/dL — ABNORMAL LOW (ref 8.9–10.3)
Chloride: 97 mmol/L — ABNORMAL LOW (ref 98–111)
Creatinine, Ser: 1.35 mg/dL — ABNORMAL HIGH (ref 0.44–1.00)
GFR, Estimated: 37 mL/min — ABNORMAL LOW (ref >60)
Glucose, Bld: 332 mg/dL — ABNORMAL HIGH (ref 70–99)
Potassium: 4.8 mmol/L (ref 3.5–5.1)
Sodium: 140 mmol/L (ref 135–145)
Total Bilirubin: 0.6 mg/dL (ref 0.3–1.2)
Total Protein: 6.8 g/dL (ref 6.5–8.1)

## 2020-05-17 LAB — D-DIMER, QUANTITATIVE: D-Dimer, Quant: 0.63 ug/mL-FEU — ABNORMAL HIGH (ref 0.00–0.50)

## 2020-05-17 LAB — HEMOGLOBIN A1C
Hgb A1c MFr Bld: 6.5 % — ABNORMAL HIGH (ref 4.8–5.6)
Mean Plasma Glucose: 139.85 mg/dL

## 2020-05-17 LAB — LACTIC ACID, PLASMA: Lactic Acid, Venous: 4.5 mmol/L (ref 0.5–1.9)

## 2020-05-17 LAB — C-REACTIVE PROTEIN: CRP: 0.6 mg/dL (ref <1.0)

## 2020-05-17 LAB — FERRITIN: Ferritin: 68 ng/mL (ref 11–307)

## 2020-05-17 LAB — MAGNESIUM: Magnesium: 2.6 mg/dL — ABNORMAL HIGH (ref 1.7–2.4)

## 2020-05-17 LAB — PHOSPHORUS: Phosphorus: 4.8 mg/dL — ABNORMAL HIGH (ref 2.5–4.6)

## 2020-05-17 MED ORDER — ATORVASTATIN CALCIUM 10 MG PO TABS
10.0000 mg | ORAL_TABLET | Freq: Every day | ORAL | Status: DC
  Administered 2020-05-17 – 2020-05-19 (×3): 10 mg via ORAL
  Filled 2020-05-17 (×3): qty 1

## 2020-05-17 MED ORDER — ACETAMINOPHEN 325 MG PO TABS
650.0000 mg | ORAL_TABLET | Freq: Four times a day (QID) | ORAL | Status: DC | PRN
  Filled 2020-05-17: qty 2

## 2020-05-17 MED ORDER — GABAPENTIN 300 MG PO CAPS
300.0000 mg | ORAL_CAPSULE | Freq: Every day | ORAL | Status: DC
  Administered 2020-05-17 – 2020-05-19 (×3): 300 mg via ORAL
  Filled 2020-05-17 (×3): qty 1

## 2020-05-17 MED ORDER — HYDROCOD POLST-CPM POLST ER 10-8 MG/5ML PO SUER: 5.0000 mL | Freq: Two times a day (BID) | ORAL | Status: DC | PRN

## 2020-05-17 MED ORDER — IPRATROPIUM-ALBUTEROL 0.5-2.5 (3) MG/3ML IN SOLN: 3.0000 mL | Freq: Four times a day (QID) | RESPIRATORY_TRACT | Status: DC | PRN

## 2020-05-17 MED ORDER — HEPARIN SODIUM (PORCINE) 5000 UNIT/ML IJ SOLN
5000.0000 [IU] | Freq: Three times a day (TID) | INTRAMUSCULAR | Status: DC
  Administered 2020-05-17 – 2020-05-19 (×9): 5000 [IU] via SUBCUTANEOUS
  Filled 2020-05-17 (×9): qty 1

## 2020-05-17 MED ORDER — ONDANSETRON HCL 4 MG/2ML IJ SOLN: 4.0000 mg | Freq: Four times a day (QID) | INTRAMUSCULAR | Status: DC | PRN

## 2020-05-17 MED ORDER — GUAIFENESIN-DM 100-10 MG/5ML PO SYRP: 10.0000 mL | ORAL_SOLUTION | ORAL | Status: DC | PRN

## 2020-05-17 MED ORDER — IPRATROPIUM-ALBUTEROL 20-100 MCG/ACT IN AERS
1.0000 | INHALATION_SPRAY | Freq: Four times a day (QID) | RESPIRATORY_TRACT | Status: DC
  Administered 2020-05-17 – 2020-05-19 (×10): 1 via RESPIRATORY_TRACT
  Filled 2020-05-17: qty 4

## 2020-05-17 MED ORDER — ASCORBIC ACID 500 MG PO TABS
500.0000 mg | ORAL_TABLET | Freq: Every day | ORAL | Status: DC
  Administered 2020-05-17 – 2020-05-19 (×3): 500 mg via ORAL
  Filled 2020-05-17 (×3): qty 1

## 2020-05-17 MED ORDER — ACETAMINOPHEN 650 MG RE SUPP: 650.0000 mg | Freq: Four times a day (QID) | RECTAL | Status: DC | PRN

## 2020-05-17 MED ORDER — INSULIN ASPART 100 UNIT/ML ~~LOC~~ SOLN
0.0000 [IU] | Freq: Three times a day (TID) | SUBCUTANEOUS | Status: DC
  Administered 2020-05-17: 5 [IU] via SUBCUTANEOUS
  Administered 2020-05-17 – 2020-05-18 (×3): 3 [IU] via SUBCUTANEOUS
  Administered 2020-05-18: 2 [IU] via SUBCUTANEOUS
  Administered 2020-05-19 (×2): 3 [IU] via SUBCUTANEOUS

## 2020-05-17 MED ORDER — ONDANSETRON HCL 4 MG PO TABS: 4.0000 mg | ORAL_TABLET | Freq: Four times a day (QID) | ORAL | Status: DC | PRN

## 2020-05-17 MED ORDER — SODIUM CHLORIDE 0.9 % IV BOLUS
500.0000 mL | Freq: Once | INTRAVENOUS | Status: AC
  Administered 2020-05-17: 500 mL via INTRAVENOUS

## 2020-05-17 MED ORDER — FLUTICASONE FUROATE-VILANTEROL 200-25 MCG/INH IN AEPB
1.0000 | INHALATION_SPRAY | Freq: Every day | RESPIRATORY_TRACT | Status: DC
  Administered 2020-05-17 – 2020-05-19 (×3): 1 via RESPIRATORY_TRACT
  Filled 2020-05-17: qty 28

## 2020-05-17 MED ORDER — INSULIN ASPART 100 UNIT/ML ~~LOC~~ SOLN
0.0000 [IU] | Freq: Every day | SUBCUTANEOUS | Status: DC
  Administered 2020-05-17: 3 [IU] via SUBCUTANEOUS

## 2020-05-17 MED ORDER — ALBUTEROL SULFATE HFA 108 (90 BASE) MCG/ACT IN AERS: 2.0000 | INHALATION_SPRAY | Freq: Four times a day (QID) | RESPIRATORY_TRACT | Status: DC | PRN

## 2020-05-17 MED ORDER — ASPIRIN 81 MG PO CHEW
81.0000 mg | CHEWABLE_TABLET | Freq: Every day | ORAL | Status: DC
  Administered 2020-05-17 – 2020-05-19 (×3): 81 mg via ORAL
  Filled 2020-05-17 (×3): qty 1

## 2020-05-17 MED ORDER — MEMANTINE HCL 10 MG PO TABS
10.0000 mg | ORAL_TABLET | Freq: Two times a day (BID) | ORAL | Status: DC
  Administered 2020-05-17 – 2020-05-19 (×5): 10 mg via ORAL
  Filled 2020-05-17 (×6): qty 1

## 2020-05-17 MED ORDER — ZINC SULFATE 220 (50 ZN) MG PO CAPS
220.0000 mg | ORAL_CAPSULE | Freq: Every day | ORAL | Status: DC
  Administered 2020-05-17 – 2020-05-19 (×3): 220 mg via ORAL
  Filled 2020-05-17 (×3): qty 1

## 2020-05-17 NOTE — Progress Notes (Signed)
PROGRESS NOTE    Candice Miller  [id number] DOB: 1925/04/13 DOA: 05/16/2020 PCP: Jodi Marble, MD    Brief Narrative:  Candice Miller is a 87 year old female with past medical history significant for COPD 2 L nasal cannula, dementia, type 2 diabetes mellitus, recent Covid-19 infection on 04/30/2020 who presents to the ED from her SNF with progressive confusion, poor oral intake.  In the ED, temperature 97.7, HR 92, RR 17, BP 135/63, SPO2 97% on 2 L nasal cannula.  Sodium 138, potassium 4.8, chloride 93, CO2 29, glucose 336, BUN 85, creatinine 1.63, magnesium 2.6.  CRP 0.6, lactic acid 5.5.  WBC 10.6, hemoglobin 9.1, platelets 390.  D-dimer 0.76.  Urinalysis with large leukoesterase, negative nitrite, many bacteria, greater than 50 WBCs. Chest x-ray with interstitial lung disease, low lung volumes, obscuration left hemidiaphragm concerning for superimposed infection.  Hospitalist service consulted for further evaluation and management of acute metabolic encephalopathy secondary to UTI versus superimposed pneumonia.   Assessment & Plan:   Active Problems:   Pneumonia due to COVID-19 virus   Acute metabolic encephalopathy, POA Community-acquired pneumonia Urinary tract infection Lactic acidosis Patient presenting from her skilled nursing facility with progressive confusion which is different from her normal baseline dementia.  Patient was afebrile with mild leukocytosis and elevated lactic acid.  Urinalysis concerning for infection.  Chest x-ray notable for left lower lobe pneumonia.  Procalcitonin elevated 0.17. CT head with no acute intracranial abnormality, noted atropy with small vessel white matter ischemic disease and old right and left cerebellar infacts.  --Urine culture: Pending --Strep pneumo urine antigen and Legionella antigen: Pending --Continue Levaquin 750 mg IV every 48 hours  COPD on 2L [place] at baseline --Breo Elipta as sustitution for home Symbicort 160-4.5 mcg 2  puffs twice daily --Duo nebs/albuterol as needed --Continue supplemental oxygen, titrate to maintain SPO2 greater than 88%, on 2 L nasal cannula SPO2 95%  Essential hypertension On diltiazem 180 mg p.o. daily, lisinopril 30 mg p.o. daily, furosemide 40mg  PO qAM and 20mg  PO qOM at home. --BP 109/68 this morning --Continue to hold home antihypertensives --Continue aspirin and statin --Continue monitor blood pressure closely; before considering restart antihypertensives  HLD: Continue atorvastatin 10 mg p.o. daily  Type 2 diabetes mellitus Hemoglobin A1c 6.5, well controlled.  Home regimen includes Metformin 500 mg every morning and 250 mg every afternoon; Trulicity, and glimepiride 2 mg p.o. daily. --Hold oral hypoglycemics while inpatient --SSI for coverage --CBGs before every meal/at bedtime  Dementia --Namenda 10 mg p.o. twice daily --Delirium precautions --Get up during the day --Encourage a familiar face to remain present throughout the day --Keep blinds open and lights on during daylight hours --Minimize the use of opioids/benzodiazepines --likely would benefit from palliative care to follow outpatient   Hx Covid-19 viral infection Patient originally diagnosed on 04/30/2020.  Was treated outpatient with monoclonal antibody and antibiotics.  Repeat COVID-19 test on admission, 05/16/2020 negative.  No need for further isolation.  Weakness, debility, deconditioning: --PT/OT eval   DVT prophylaxis: Heparin   Code Status: DNR Family Communication: Updated patients daughter, [name] via telephone this afternoon  Disposition Plan:  Level of care: Med-Surg Status is: Inpatient  Remains inpatient appropriate because:Ongoing diagnostic testing needed not appropriate for outpatient work up, Unsafe d/c plan, IV treatments appropriate due to intensity of illness or inability to take PO and Inpatient level of care appropriate due to severity of illness   Dispo: The patient is from:  SNF  Anticipated d/c is to: SNF              Anticipated d/c date is: 1 day              Patient currently is not medically stable to d/c.   Difficult to place patient No   Consultants:   none  Procedures:   none  Antimicrobials:   Levaquin   Subjective: Patient seen and examined bedside, resting comfortably.  Pleasantly confused.  No family present.  No specific complaints this morning, denies chest pain, no shortness of breath, no abdominal pain.  On 2 L nasal cannula which is her baseline.  No acute concerns per nursing staff this morning.  Objective: Vitals:   05/17/20 0010 05/17/20 0043 05/17/20 0448 05/17/20 1155  BP: 134/67 140/60 109/68 (!) 145/74  Pulse: 94 93 87 83  Resp: 19 20 20 20   Temp: 98.1 F (36.7 C) 98.3 F (36.8 C) 98.2 F (36.8 C) 98.2 F (36.8 C)  TempSrc: Oral Oral Oral Oral  SpO2: 97% 97% 95% 98%  Weight:  85.2 kg    Height:  5\' 1"  (1.549 m)      Intake/Output Summary (Last 24 hours) at 05/17/2020 1317 Last data filed at 05/16/2020 1815 Gross per 24 hour  Intake 1000 ml  Output --  Net 1000 ml   Filed Weights   05/16/20 1904 05/17/20 0043  Weight: 90 kg 85.2 kg    Examination:  General exam: Appears calm and comfortable, pleasantly confused Respiratory system: Coarse breath sounds bilaterally, normal [place] effort, on 2 L nasal cannula with SPO2 95% which is her baseline Cardiovascular system: S1 & S2 heard, RRR. No JVD, murmurs, rubs, gallops or clicks. No pedal edema. Gastrointestinal system: Abdomen is nondistended, soft and nontender. No organomegaly or masses felt. Normal bowel sounds heard. Central nervous system: Alert, not oriented to person/place/time or situation. No focal neurological deficits. Extremities: Symmetric 5 x 5 power. Skin: No rashes, lesions or ulcers Psychiatry: Judgement and insight appear poor. Mood & affect appropriate.     Data Reviewed: I have personally reviewed following labs and imaging  studies  CBC: Recent Labs  Lab 05/16/20 1240 05/17/20 0119  WBC 10.6* 11.0*  NEUTROABS 9.2* 9.7*  HGB 9.1* 8.7*  HCT 29.8* 28.5*  MCV 95.2 95.3  PLT 390 785   Basic Metabolic Panel: Recent Labs  Lab 05/16/20 1240 05/17/20 0119  NA 138 140  K 4.8 4.8  CL 93* 97*  CO2 29 26  GLUCOSE 336* 332*  BUN 85* 81*  CREATININE 1.63* 1.35*  CALCIUM 9.1 8.8*  MG  --  2.6*  PHOS  --  4.8*   GFR: Estimated Creatinine Clearance: 25.8 mL/min (A) (by C-G formula based on SCr of 1.35 mg/dL (H)). Liver Function Tests: Recent Labs  Lab 05/16/20 1240 05/17/20 0119  AST 21 20  ALT 14 14  ALKPHOS 101 96  BILITOT 0.4 0.6  PROT 7.2 6.8  ALBUMIN 3.5 3.4*   No results for input(s): LIPASE, AMYLASE in the last 168 hours. Recent Labs  Lab 05/16/20 1240  AMMONIA 10   Coagulation Profile: No results for input(s): INR, PROTIME in the last 168 hours. Cardiac Enzymes: No results for input(s): CKTOTAL, CKMB, CKMBINDEX, TROPONINI in the last 168 hours. BNP (last 3 results) No results for input(s): PROBNP in the last 8760 hours. HbA1C: Recent Labs    05/17/20 0119  HGBA1C 6.5*   CBG: Recent Labs  Lab 05/17/20 0128 05/17/20 0751 05/17/20  [place]*   Lipid Profile: Recent Labs    05/16/20 1240  TRIG 136   Thyroid Function Tests: No results for input(s): TSH, T4TOTAL, FREET4, T3FREE, THYROIDAB in the last 72 hours. Anemia Panel: Recent Labs    05/16/20 1240 05/17/20 0119  FERRITIN 72 68   Sepsis Labs: Recent Labs  Lab 05/16/20 1240 05/16/20 1446 05/17/20 0119  PROCALCITON 0.17  --   --   LATICACIDVEN 5.5* 4.4* 4.5*    Recent Results (from the past 240 hour(s))  SARS Coronavirus 2 by RT PCR (hospital order, performed in [facility] hospital lab) Nasopharyngeal Nasopharyngeal Swab     Status: None   Collection Time: 05/16/20  2:46 PM   Specimen: Nasopharyngeal Swab  Result Value Ref Range Status   SARS Coronavirus 2 NEGATIVE NEGATIVE Final     Comment: (NOTE) SARS-CoV-2 target nucleic acids are NOT DETECTED.  The SARS-CoV-2 RNA is generally detectable in upper and lower respiratory specimens during the acute phase of infection. The lowest concentration of SARS-CoV-2 viral copies this assay can detect is 250 copies / mL. A negative result does not preclude SARS-CoV-2 infection and should not be used as the sole basis for treatment or other patient management decisions.  A negative result may occur with improper specimen collection / handling, submission of specimen other than nasopharyngeal swab, presence of viral mutation(s) within the areas targeted by this assay, and inadequate number of viral copies (<250 copies / mL). A negative result must be combined with clinical observations, patient history, and epidemiological information.  Fact Sheet for Patients:   StrictlyIdeas.no  Fact Sheet for Healthcare Providers: BankingDealers.co.za  This test is not yet approved or  cleared by the [place] FDA and has been authorized for detection and/or diagnosis of SARS-CoV-2 by FDA under an Emergency Use Authorization (EUA).  This EUA will remain in effect (meaning this test can be used) for the duration of the COVID-19 declaration under Section 564(b)(1) of the Act, 21 U.S.C. section 360bbb-3(b)(1), unless the authorization is terminated or revoked sooner.  Performed at [facility], [place] [address] [place], [place] [address]          Radiology Studies: CT HEAD WO CONTRAST  Result Date: 05/17/2020 CLINICAL DATA:  Mental status changes. EXAM: CT HEAD WITHOUT CONTRAST TECHNIQUE: Contiguous axial images were obtained from the base of the skull through the vertex without intravenous contrast. COMPARISON:  11/02/2017 FINDINGS: Brain: There is no evidence for acute hemorrhage, hydrocephalus, mass lesion, or abnormal extra-axial fluid collection. No definite CT  evidence for acute infarction. Diffuse loss of parenchymal volume is consistent with atrophy. Patchy low attenuation in the deep hemispheric and periventricular white matter is nonspecific, but likely reflects chronic microvascular ischemic demyelination. Stable appearance right frontal encephalomalacia, likely from old infarct. Lacunar infarct noted inferior left cerebellum. Vascular: No hyperdense vessel or unexpected calcification. Skull: No evidence for fracture. No worrisome lytic or sclerotic lesion. Sinuses/Orbits: The visualized paranasal sinuses and mastoid air cells are clear. Visualized portions of the globes and intraorbital fat are unremarkable. Other: None. IMPRESSION: 1. No acute intracranial abnormality. 2. Atrophy with chronic small vessel white matter ischemic disease. 3. 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 Electronically Signed   By: Misty Stanley M.D.   On: 05/17/2020 13:08   DG Chest Port 1 View  Result Date: 05/16/2020 CLINICAL DATA:  Mental status changes with known COVID-19 EXAM: PORTABLE CHEST 1 VIEW COMPARISON:  10/06/2019 FINDINGS: Midline trachea. Numerous  leads and wires project over the chest. Normal heart size. Atherosclerosis in the transverse aorta. No pleural effusion or pneumothorax. Lung volumes are low. Basilar and peripheral predominant pulmonary interstitial thickening. Worsened left base aeration with suggestion of superimposed airspace disease, obscuring the left hemidiaphragm. IMPRESSION: Interstitial lung disease, making evaluation for COVID-19 pneumonia challenging. Low lung volumes. New obscuration of the left hemidiaphragm, for which superimposed infection or aspiration are concerns. Atelectasis in the setting of volume loss could look similar. Aortic Atherosclerosis (ICD10-I70.0). Electronically Signed   By: Abigail Miyamoto M.D.   On: 05/16/2020 13:41        Scheduled Meds: . vitamin C  500 mg Oral Daily  . aspirin  81 mg Oral Daily  . atorvastatin   10 mg Oral Daily  . fluticasone furoate-vilanterol  1 puff Inhalation Daily  . gabapentin  300 mg Oral QHS  . heparin  5,000 Units Subcutaneous Q8H  . insulin aspart  0-15 Units Subcutaneous TID WC  . insulin aspart  0-5 Units Subcutaneous QHS  . Ipratropium-Albuterol  1 puff Inhalation Q6H  . memantine  10 mg Oral BID  . zinc sulfate  220 mg Oral Daily   Continuous Infusions: . sodium chloride 100 mL/hr at 05/17/20 0050  . levofloxacin (LEVAQUIN) IV 750 mg (05/16/20 2015)     LOS: 1 day    Time spent: 39 minutes spent on chart review, discussion with nursing staff, consultants, updating family and interview/physical exam; more than 50% of that time was spent in counseling and/or coordination of care.    Giuliana Handyside J [place] DO [facility] Available via Epic secure chat 7am-7pm After these hours, please refer to coverage provider listed on [email or web address] 05/17/2020, 1:17 PM

## 2020-05-17 NOTE — Plan of Care (Signed)
  Problem: Education: Goal: Knowledge of risk factors and measures for prevention of condition will improve Outcome: Progressing   Problem: Coping: Goal: Psychosocial and spiritual needs will be supported Outcome: Progressing   Problem: Respiratory: Goal: Will maintain a patent airway Outcome: Progressing Goal: Complications related to the disease process, condition or treatment will be avoided or minimized Outcome: Progressing   

## 2020-05-17 NOTE — Plan of Care (Signed)

## 2020-05-18 DIAGNOSIS — G9341 Metabolic encephalopathy: Secondary | ICD-10-CM | POA: Diagnosis not present

## 2020-05-18 LAB — CBC WITH DIFFERENTIAL/PLATELET
Abs Immature Granulocytes: 0.12 10*3/uL — ABNORMAL HIGH (ref 0.00–0.07)
Basophils Absolute: 0 10*3/uL (ref 0.0–0.1)
Basophils Relative: 0 %
Eosinophils Absolute: 0 10*3/uL (ref 0.0–0.5)
Eosinophils Relative: 0 %
HCT: 28.5 % — ABNORMAL LOW (ref 36.0–46.0)
Hemoglobin: 8.8 g/dL — ABNORMAL LOW (ref 12.0–15.0)
Immature Granulocytes: 1 %
Lymphocytes Relative: 17 %
Lymphs Abs: 2.6 10*3/uL (ref 0.7–4.0)
MCH: 28.9 pg (ref 26.0–34.0)
MCHC: 30.9 g/dL (ref 30.0–36.0)
MCV: 93.8 fL (ref 80.0–100.0)
Monocytes Absolute: 1.3 10*3/uL — ABNORMAL HIGH (ref 0.1–1.0)
Monocytes Relative: 9 %
Neutro Abs: 10.7 10*3/uL — ABNORMAL HIGH (ref 1.7–7.7)
Neutrophils Relative %: 73 %
Platelets: 351 10*3/uL (ref 150–400)
RBC: 3.04 MIL/uL — ABNORMAL LOW (ref 3.87–5.11)
RDW: 13.9 % (ref 11.5–15.5)
WBC: 14.7 10*3/uL — ABNORMAL HIGH (ref 4.0–10.5)
nRBC: 0.1 % (ref 0.0–0.2)

## 2020-05-18 LAB — COMPREHENSIVE METABOLIC PANEL
ALT: 14 U/L (ref 0–44)
AST: 22 U/L (ref 15–41)
Albumin: 3.3 g/dL — ABNORMAL LOW (ref 3.5–5.0)
Alkaline Phosphatase: 80 U/L (ref 38–126)
Anion gap: 11 (ref 5–15)
BUN: 57 mg/dL — ABNORMAL HIGH (ref 8–23)
CO2: 32 mmol/L (ref 22–32)
Calcium: 8.7 mg/dL — ABNORMAL LOW (ref 8.9–10.3)
Chloride: 101 mmol/L (ref 98–111)
Creatinine, Ser: 1.02 mg/dL — ABNORMAL HIGH (ref 0.44–1.00)
GFR, Estimated: 51 mL/min — ABNORMAL LOW (ref >60)
Glucose, Bld: 175 mg/dL — ABNORMAL HIGH (ref 70–99)
Potassium: 4 mmol/L (ref 3.5–5.1)
Sodium: 144 mmol/L (ref 135–145)
Total Bilirubin: 0.6 mg/dL (ref 0.3–1.2)
Total Protein: 6.4 g/dL — ABNORMAL LOW (ref 6.5–8.1)

## 2020-05-18 LAB — GLUCOSE, CAPILLARY
Glucose-Capillary: 181 mg/dL — ABNORMAL HIGH (ref 70–99)
Glucose-Capillary: 154 mg/dL — ABNORMAL HIGH (ref 70–99)
Glucose-Capillary: 150 mg/dL — ABNORMAL HIGH (ref 70–99)
Glucose-Capillary: 160 mg/dL — ABNORMAL HIGH (ref 70–99)
Glucose-Capillary: 108 mg/dL — ABNORMAL HIGH (ref 70–99)

## 2020-05-18 LAB — MAGNESIUM: Magnesium: 2.4 mg/dL (ref 1.7–2.4)

## 2020-05-18 LAB — D-DIMER, QUANTITATIVE: D-Dimer, Quant: 0.88 ug/mL-FEU — ABNORMAL HIGH (ref 0.00–0.50)

## 2020-05-18 LAB — PHOSPHORUS: Phosphorus: 3.1 mg/dL (ref 2.5–4.6)

## 2020-05-18 LAB — C-REACTIVE PROTEIN: CRP: 0.5 mg/dL (ref <1.0)

## 2020-05-18 NOTE — Progress Notes (Signed)
PROGRESS NOTE    Candice Miller  [id number] DOB: 07-03-1924 DOA: 05/16/2020 PCP: Jodi Marble, MD    Brief Narrative:  Candice Miller is a 87 year old female with past medical history significant for COPD 2 L nasal cannula, dementia, type 2 diabetes mellitus, recent Covid-19 infection on 04/30/2020 who presents to the ED from her SNF with progressive confusion, poor oral intake.  In the ED, temperature 97.7, HR 92, RR 17, BP 135/63, SPO2 97% on 2 L nasal cannula.  Sodium 138, potassium 4.8, chloride 93, CO2 29, glucose 336, BUN 85, creatinine 1.63, magnesium 2.6.  CRP 0.6, lactic acid 5.5.  WBC 10.6, hemoglobin 9.1, platelets 390.  D-dimer 0.76.  Urinalysis with large leukoesterase, negative nitrite, many bacteria, greater than 50 WBCs. Chest x-ray with interstitial lung disease, low lung volumes, obscuration left hemidiaphragm concerning for superimposed infection.  Hospitalist service consulted for further evaluation and management of acute metabolic encephalopathy secondary to UTI versus superimposed pneumonia.   Assessment & Plan:   Principal Problem:   Acute metabolic encephalopathy Active Problems:   COPD (chronic obstructive pulmonary disease) (HCC)   Diabetes mellitus without complication (HCC)   HTN (hypertension)   Dementia without behavioral disturbance (HCC)   Confusion   GERD (gastroesophageal reflux disease)   Hyperlipidemia   Pneumonia due to COVID-19 virus   Acute lower UTI   Acute metabolic encephalopathy, POA Community-acquired pneumonia E. coli urinary tract infection Lactic acidosis Patient presenting from her skilled nursing facility with progressive confusion which is different from her normal baseline dementia.  Patient was afebrile with mild leukocytosis and elevated lactic acid.  Urinalysis concerning for infection.  Chest x-ray notable for left lower lobe pneumonia.  Procalcitonin elevated 0.17. CT head with no acute intracranial abnormality, noted  atropy with small vessel white matter ischemic disease and old right and left cerebellar infacts.  --Urine culture: >100K E. coli, awaiting susceptibilities --Continue Levaquin 750 mg IV every 48 hours  COPD on 2L [place] at baseline --Breo Elipta as sustitution for home Symbicort 160-4.5 mcg 2 puffs twice daily --Duo nebs/albuterol as needed --Continue supplemental oxygen, titrate to maintain SPO2 greater than 88%, on 2 L nasal cannula SPO2 95%  Essential hypertension On diltiazem 180 mg p.o. daily, lisinopril 30 mg p.o. daily, furosemide 40mg  PO qAM and 20mg  PO qOM at home. --BP 139/65 this morning --Continue to hold home antihypertensives --Continue aspirin and statin --Continue monitor blood pressure closely; before considering restart antihypertensives  HLD: Continue atorvastatin 10 mg p.o. daily  Type 2 diabetes mellitus Hemoglobin A1c 6.5, well controlled.  Home regimen includes Metformin 500 mg every morning and 250 mg every afternoon; Trulicity, and glimepiride 2 mg p.o. daily. --Hold oral hypoglycemics while inpatient --SSI for coverage --CBGs before every meal/at bedtime  Dementia --Namenda 10 mg p.o. twice daily --Delirium precautions --Get up during the day --Encourage a familiar face to remain present throughout the day --Keep blinds open and lights on during daylight hours --Minimize the use of opioids/benzodiazepines --likely would benefit from palliative care to follow outpatient   Hx Covid-19 viral infection Patient originally diagnosed on 04/30/2020.  Was treated outpatient with monoclonal antibody and antibiotics.  Repeat COVID-19 test on admission, 05/16/2020 negative.  No need for further isolation.  Weakness, debility, deconditioning: --PT/OT eval pending   DVT prophylaxis: Heparin   Code Status: DNR Family Communication: Updated patients daughter, Candice Miller at bedside this morning  Disposition Plan:  Level of care: Med-Surg Status is: Inpatient  Remains  inpatient appropriate because:Ongoing diagnostic testing  needed not appropriate for outpatient work up, Unsafe d/c plan, IV treatments appropriate due to intensity of illness or inability to take PO and Inpatient level of care appropriate due to severity of illness   Dispo: The patient is from: SNF              Anticipated d/c is to: SNF [facility]              Anticipated d/c date is: 1 day              Patient currently is not medically stable to d/c.   Difficult to place patient No   Consultants:   none  Procedures:   none  Antimicrobials:   Levaquin   Subjective: Patient seen and examined bedside, resting comfortably.  Pleasantly confused.  Daughter, Candice Miller present. No specific complaints this morning, denies chest pain, no shortness of breath, no abdominal pain.  On 2 L nasal cannula which is her baseline.  Nursing reports that she did not sleep much last night, otherwise no acute concerns per nursing staff this morning.  Objective: Vitals:   05/17/20 0448 05/17/20 1155 05/17/20 2149 05/18/20 0415  BP: 109/68 (!) 145/74 (!) 150/75 139/65  Pulse: 87 83 98 87  Resp: 20 20 20 18   Temp: 98.2 F (36.8 C) 98.2 F (36.8 C) 98.7 F (37.1 C) 98.4 F (36.9 C)  TempSrc: Oral Oral Oral Oral  SpO2: 95% 98% 93% 92%  Weight:      Height:        Intake/Output Summary (Last 24 hours) at 05/18/2020 1139 Last data filed at 05/18/2020 0431 Gross per 24 hour  Intake --  Output 1800 ml  Net -1800 ml   Filed Weights   05/16/20 1904 05/17/20 0043  Weight: 90 kg 85.2 kg    Examination:  General exam: Appears calm and comfortable, pleasantly confused Respiratory system: Coarse breath sounds bilaterally, normal [place]paire effort, on 2 L nasal cannula with SPO2 95% which is her baseline Cardiovascular system: S1 & S2 heard, RRR. No JVD, murmurs, rubs, gallops or clicks. No pedal edema. Gastrointestinal system: Abdomen is nondistended, soft and nontender. No organomegaly or masses  felt. Normal bowel sounds heard. Central nervous system: Alert, not oriented to person/place/time or situation. No focal neurological deficits. Extremities: Symmetric 5 x 5 power. Skin: No rashes, lesions or ulcers Psychiatry: Judgement and insight appear poor. Mood & affect appropriate.     Data Reviewed: I have personally reviewed following labs and imaging studies  CBC: Recent Labs  Lab 05/16/20 1240 05/17/20 0119 05/18/20 0406  WBC 10.6* 11.0* 14.7*  NEUTROABS 9.2* 9.7* 10.7*  HGB 9.1* 8.7* 8.8*  HCT 29.8* 28.5* 28.5*  MCV 95.2 95.3 93.8  PLT 390 367 836   Basic Metabolic Panel: Recent Labs  Lab 05/16/20 1240 05/17/20 0119 05/18/20 0406  NA 138 140 144  K 4.8 4.8 4.0  CL 93* 97* 101  CO2 29 26 32  GLUCOSE 336* 332* 175*  BUN 85* 81* 57*  CREATININE 1.63* 1.35* 1.02*  CALCIUM 9.1 8.8* 8.7*  MG  --  2.6* 2.4  PHOS  --  4.8* 3.1   GFR: Estimated Creatinine Clearance: 34.2 mL/min (A) (by C-G formula based on SCr of 1.02 mg/dL (H)). Liver Function Tests: Recent Labs  Lab 05/16/20 1240 05/17/20 0119 05/18/20 0406  AST 21 20 22   ALT 14 14 14   ALKPHOS 101 96 80  BILITOT 0.4 0.6 0.6  PROT 7.2 6.8 6.4*  ALBUMIN  3.5 3.4* 3.3*   No results for input(s): LIPASE, AMYLASE in the last 168 hours. Recent Labs  Lab 05/16/20 1240  AMMONIA 10   Coagulation Profile: No results for input(s): INR, PROTIME in the last 168 hours. Cardiac Enzymes: No results for input(s): CKTOTAL, CKMB, CKMBINDEX, TROPONINI in the last 168 hours. BNP (last 3 results) No results for input(s): PROBNP in the last 8760 hours. HbA1C: Recent Labs    05/17/20 0119  HGBA1C 6.5*   CBG: Recent Labs  Lab 05/17/20 1150 05/17/20 1627 05/17/20 2147 05/18/20 0913 05/18/20 1114  GLUCAP 195* 116* 156* 154* 150*   Lipid Profile: Recent Labs    05/16/20 1240  TRIG 136   Thyroid Function Tests: No results for input(s): TSH, T4TOTAL, FREET4, T3FREE, THYROIDAB in the last 72  hours. Anemia Panel: Recent Labs    05/16/20 1240 05/17/20 0119  FERRITIN 72 68   Sepsis Labs: Recent Labs  Lab 05/16/20 1240 05/16/20 1446 05/17/20 0119  PROCALCITON 0.17  --   --   LATICACIDVEN 5.5* 4.4* 4.5*    Recent Results (from the past 240 hour(s))  SARS Coronavirus 2 by RT PCR (hospital order, performed in [facility] hospital lab) Nasopharyngeal Nasopharyngeal Swab     Status: None   Collection Time: 05/16/20  2:46 PM   Specimen: Nasopharyngeal Swab  Result Value Ref Range Status   SARS Coronavirus 2 NEGATIVE NEGATIVE Final    Comment: (NOTE) SARS-CoV-2 target nucleic acids are NOT DETECTED.  The SARS-CoV-2 RNA is generally detectable in upper and lower respiratory specimens during the acute phase of infection. The lowest concentration of SARS-CoV-2 viral copies this assay can detect is 250 copies / mL. A negative result does not preclude SARS-CoV-2 infection and should not be used as the sole basis for treatment or other patient management decisions.  A negative result may occur with improper specimen collection / handling, submission of specimen other than nasopharyngeal swab, presence of viral mutation(s) within the areas targeted by this assay, and inadequate number of viral copies (<250 copies / mL). A negative result must be combined with clinical observations, patient history, and epidemiological information.  Fact Sheet for Patients:   StrictlyIdeas.no  Fact Sheet for Healthcare Providers: BankingDealers.co.za  This test is not yet approved or  cleared by the [place] FDA and has been authorized for detection and/or diagnosis of SARS-CoV-2 by FDA under an Emergency Use Authorization (EUA).  This EUA will remain in effect (meaning this test can be used) for the duration of the COVID-19 declaration under Section 564(b)(1) of the Act, 21 U.S.C. section 360bbb-3(b)(1), unless the authorization is  terminated or revoked sooner.  Performed at [facility], [place] [address], [place], [place] [address]   Culture, Urine     Status: Abnormal (Preliminary result)   Collection Time: 05/16/20  3:00 PM   Specimen: Urine, Catheterized  Result Value Ref Range Status   Specimen Description   Final    URINE, CATHETERIZED Performed at [place] [address], [place], [place] [address]    Special Requests   Final    NONE Performed at [facility], [place] [address] [place],  [address]    Culture (A)  Final    >=100,000 COLONIES/mL ESCHERICHIA COLI CULTURE REINCUBATED FOR BETTER GROWTH SUSCEPTIBILITIES TO FOLLOW Performed at [facility], [place] [address] [place],  [address]    Report Status PENDING  Incomplete  Radiology Studies: CT HEAD WO CONTRAST  Result Date: 05/17/2020 CLINICAL DATA:  Mental status changes. EXAM: CT HEAD WITHOUT CONTRAST TECHNIQUE: Contiguous axial images were obtained from the base of the skull through the vertex without intravenous contrast. COMPARISON:  11/02/2017 FINDINGS: Brain: There is no evidence for acute hemorrhage, hydrocephalus, mass lesion, or abnormal extra-axial fluid collection. No definite CT evidence for acute infarction. Diffuse loss of parenchymal volume is consistent with atrophy. Patchy low attenuation in the deep hemispheric and periventricular white matter is nonspecific, but likely reflects chronic microvascular ischemic demyelination. Stable appearance right frontal encephalomalacia, likely from old infarct. Lacunar infarct noted inferior left cerebellum. Vascular: No hyperdense vessel or unexpected calcification. Skull: No evidence for fracture. No worrisome lytic or sclerotic lesion. Sinuses/Orbits: The visualized paranasal sinuses and mastoid air cells are clear. Visualized portions of the globes and intraorbital fat are unremarkable. Other: None. IMPRESSION:  1. No acute intracranial abnormality. 2. Atrophy with chronic small vessel white matter ischemic disease. 3. 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 Electronically Signed   By: Misty Stanley M.D.   On: 05/17/2020 13:08   DG Chest Port 1 View  Result Date: 05/16/2020 CLINICAL DATA:  Mental status changes with known COVID-19 EXAM: PORTABLE CHEST 1 VIEW COMPARISON:  10/06/2019 FINDINGS: Midline trachea. Numerous leads and wires project over the chest. Normal heart size. Atherosclerosis in the transverse aorta. No pleural effusion or pneumothorax. Lung volumes are low. Basilar and peripheral predominant pulmonary interstitial thickening. Worsened left base aeration with suggestion of superimposed airspace disease, obscuring the left hemidiaphragm. IMPRESSION: Interstitial lung disease, making evaluation for COVID-19 pneumonia challenging. Low lung volumes. New obscuration of the left hemidiaphragm, for which superimposed infection or aspiration are concerns. Atelectasis in the setting of volume loss could look similar. Aortic Atherosclerosis (ICD10-I70.0). Electronically Signed   By: Abigail Miyamoto M.D.   On: 05/16/2020 13:41        Scheduled Meds: . vitamin C  500 mg Oral Daily  . aspirin  81 mg Oral Daily  . atorvastatin  10 mg Oral Daily  . fluticasone furoate-vilanterol  1 puff Inhalation Daily  . gabapentin  300 mg Oral QHS  . heparin  5,000 Units Subcutaneous Q8H  . insulin aspart  0-15 Units Subcutaneous TID WC  . insulin aspart  0-5 Units Subcutaneous QHS  . Ipratropium-Albuterol  1 puff Inhalation Q6H  . memantine  10 mg Oral BID  . zinc sulfate  220 mg Oral Daily   Continuous Infusions: . sodium chloride 100 mL/hr at 05/18/20 0427  . levofloxacin (LEVAQUIN) IV 750 mg (05/16/20 2015)     LOS: 2 days    Time spent: 39 minutes spent on chart review, discussion with nursing staff, consultants, updating family and interview/physical exam; more than 50% of that time was spent in  counseling and/or coordination of care.    Axten Pascucci J [place] DO [facility] Available via Epic secure chat 7am-7pm After these hours, please refer to coverage provider listed on [email or web address] 05/18/2020, 11:39 AM

## 2020-05-18 NOTE — NC FL2 (Signed)
[place] LEVEL OF CARE SCREENING TOOL     IDENTIFICATION  Patient Name: Candice Miller Birthdate: 03-05-1925 Sex: female Admission Date (Current Location): 05/16/2020  [place] and [place] Number:  Kathleen Argue [id number] [place] and Address:  [facility],  [place], [place]      Provider Number: [id number]  Attending Physician Name and Address:  [place] Isaack Preble J, DO  Relative Name and Phone Number:  [name] Daughter [phone] or Severiano Gilbert   [phone]    Current Level of Care: Hospital Recommended Level of Care: [place] Prior Approval Number:    Date Approved/Denied:   PASRR Number: [id number]  Discharge Plan: SNF    Current Diagnoses: Patient Active Problem List   Diagnosis Date Noted  . Acute lower UTI 05/17/2020  . Pneumonia due to COVID-19 virus 05/16/2020  . Chest pain 01/17/2018  . Acute metabolic encephalopathy 28/31/5176  . Compression fracture of L2 lumbar vertebra ([place]) 11/03/2017  . Hyperlipidemia 09/20/2017  . Paroxysmal SVT (supraventricular tachycardia) ([place]) 08/17/2017  . Chronic back pain 08/17/2017  . Encounter for palliative care   . Goals of care, counseling/discussion   . Acute urinary retention 08/13/2017  . Frequent falls 08/13/2017  . Pressure injury of skin 08/13/2017  . CAP (community acquired pneumonia) 08/12/2017  . Acute respiratory failure with hypoxia ([place]) 06/20/2017  . Accident due to mechanical fall without injury 06/20/2017  . Dementia without behavioral disturbance ([place]) 06/20/2017  . Confusion 06/20/2017  . Hyperlipidemia associated with type 2 diabetes mellitus ([place]) 06/20/2017  . GERD (gastroesophageal reflux disease) 06/20/2017  . Nodule of middle lobe of right lung 06/20/2017  . COPD with acute exacerbation ([place]) 06/15/2017  . Fall at home, initial encounter 06/15/2017  . COPD (chronic obstructive pulmonary disease) ([place]) 12/31/2016  . Diabetes  mellitus without complication ([place]) 16/10/3708  . History of breast cancer 12/31/2016  . HTN (hypertension) 12/31/2016  . Hemorrhoids 12/31/2016  . Nodule of left lung 12/31/2016  . Lower GI bleed 12/30/2016  . Memory loss 10/22/2012    Orientation RESPIRATION BLADDER Height & Weight     Self  O2 (2L) Incontinent Weight: 187 lb 13.3 oz (85.2 kg) Height:  5\' 1"  (154.9 cm)  BEHAVIORAL SYMPTOMS/MOOD NEUROLOGICAL BOWEL NUTRITION STATUS      Incontinent Diet (Carb Modified diet)  AMBULATORY STATUS COMMUNICATION OF NEEDS Skin   Extensive Assist Verbally Normal                       Personal Care Assistance Level of Assistance  Bathing,Feeding,Dressing Bathing Assistance: Maximum assistance Feeding assistance: Maximum assistance Dressing Assistance: Maximum assistance     Functional Limitations Info  Sight,Speech,Hearing Sight Info: Adequate Hearing Info: Adequate Speech Info: Adequate    SPECIAL CARE FACTORS FREQUENCY  PT (By licensed PT),OT (By licensed OT)     PT Frequency: Minimum 5x a week OT Frequency: Minimum 5x a week            Contractures Contractures Info: Not present    Additional Factors Info  Code Status,Allergies,Insulin Sliding Scale Code Status Info: DNR Allergies Info: Adhesive, Macrodantin, Sulfa Antibiotics   Keflex   Insulin Sliding Scale Info: insulin aspart (novoLOG) injection 0-15 Units 3x a day with meals       Current Medications (05/18/2020):  This is the current hospital active medication list Current Facility-Administered Medications  Medication Dose Route Frequency Provider Last Rate Last Admin  . 0.9 %  sodium chloride infusion  Intravenous Continuous Cherylann Ratel A, DO 100 mL/hr at 05/18/20 0427 New Bag at 05/18/20 0427  . acetaminophen (TYLENOL) tablet 650 mg  650 mg Oral Q6H PRN Marylyn Ishihara, Tyrone A, DO       Or  . acetaminophen (TYLENOL) suppository 650 mg  650 mg Rectal Q6H PRN Marylyn Ishihara, Tyrone A, DO      . albuterol (VENTOLIN HFA)  108 (90 Base) MCG/ACT inhaler 2 puff  2 puff Inhalation Q6H PRN [place] Vernisha Bacote J, DO      . ascorbic acid (VITAMIN C) tablet 500 mg  500 mg Oral Daily Kyle, Tyrone A, DO   500 mg at 05/18/20 0914  . aspirin chewable tablet 81 mg  81 mg Oral Daily [place] Lulubelle Simcoe J, DO   81 mg at 05/18/20 1025  . atorvastatin (LIPITOR) tablet 10 mg  10 mg Oral Daily [place] Donnamarie Poag, DO   10 mg at 05/18/20 8527  . chlorpheniramine-HYDROcodone (TUSSIONEX) 10-8 MG/5ML suspension 5 mL  5 mL Oral Q12H PRN Marylyn Ishihara, Tyrone A, DO      . fluticasone furoate-vilanterol (BREO ELLIPTA) 200-25 MCG/INH 1 puff  1 puff Inhalation Daily [place] Donnamarie Poag, DO   1 puff at 05/18/20 0915  . gabapentin (NEURONTIN) capsule 300 mg  300 mg Oral QHS [place] Xiomar Crompton J, DO   300 mg at 05/18/20 2136  . guaiFENesin-dextromethorphan (ROBITUSSIN DM) 100-10 MG/5ML syrup 10 mL  10 mL Oral Q4H PRN Marylyn Ishihara, Tyrone A, DO      . heparin injection 5,000 Units  5,000 Units Subcutaneous Q8H Kyle, Tyrone A, DO   5,000 Units at 05/18/20 2136  . insulin aspart (novoLOG) injection 0-15 Units  0-15 Units Subcutaneous TID WC Kyle, Tyrone A, DO   3 Units at 05/18/20 1757  . insulin aspart (novoLOG) injection 0-5 Units  0-5 Units Subcutaneous QHS Kyle, Tyrone A, DO   3 Units at 05/17/20 0159  . Ipratropium-Albuterol (COMBIVENT) respimat 1 puff  1 puff Inhalation Q6H Kyle, Tyrone A, DO   1 puff at 05/18/20 2136  . ipratropium-albuterol (DUONEB) 0.5-2.5 (3) MG/3ML nebulizer solution 3 mL  3 mL Nebulization Q6H PRN [place] Donnamarie Poag, DO      . levofloxacin (LEVAQUIN) IVPB 750 mg  750 mg Intravenous Q48H Kyle, Tyrone A, DO 100 mL/hr at 05/18/20 2135 750 mg at 05/18/20 2135  . memantine (NAMENDA) tablet 10 mg  10 mg Oral BID [place] Weaver Tweed J, DO   10 mg at 05/18/20 2136  . ondansetron (ZOFRAN) tablet 4 mg  4 mg Oral Q6H PRN Marylyn Ishihara, Tyrone A, DO       Or  . ondansetron (ZOFRAN) injection 4 mg  4 mg Intravenous Q6H PRN Marylyn Ishihara, Tyrone A, DO      . zinc sulfate capsule 220 mg  220 mg Oral  Daily Kyle, Tyrone A, DO   220 mg at 05/18/20 7824     Discharge Medications: Please see discharge summary for a list of discharge medications.  Relevant Imaging Results:  Relevant Lab Results:   Additional Information SSN [id number]  Ross Ludwig, LCSW

## 2020-05-18 NOTE — Plan of Care (Signed)

## 2020-05-18 NOTE — Evaluation (Signed)
Occupational Therapy Evaluation Patient Details Name: Candice Miller MRN: [id number] DOB: 03-29-1925 Today's Date: 05/18/2020    History of Present Illness Pt admitted from SNF with AMS and with hx of NPH, COPD, DM, Breast CA s/p mastectomy, demnetia and COVID (dx 04/30/20 and now off precautions)   Clinical Impression   Candice Miller is a 87 year old woman from local SNF who presents with acute metabolic encephalopathy - found to have left lower pneumonia and UTI. On evaluation patient on 2 L [place], lethargic due to lack of sleep during the night but pleasant and following most commands. Patient presents with generalized weakness, decreased activity tolerance and impaired balance. Patient predominantly mod-max assist for upper and lower body ADLs and toileting. Needs setup for grooming and today assistance for feeding due to lethargy. Patient mod x 2 to transfer to side of bed and to chair. Patient will benefit from skilled OT services to improve participation in ADLs, balance and maintain functional mobility in order to reduce caregiver burden. Recommend return to SNF at discharge. May benefit from rehab at facility to maximize functional abilities due to recent hospitalizations and medical decline.     Follow Up Recommendations  SNF    Equipment Recommendations  None recommended by OT    Recommendations for Other Services       Precautions / Restrictions Precautions Precautions: Fall Restrictions Weight Bearing Restrictions: No      Mobility Bed Mobility Overal bed mobility: Needs Assistance Bed Mobility: Supine to Sit     Supine to sit: Mod assist;+2 for physical assistance;HOB elevated     General bed mobility comments: Multimodal cues with intermittent assist from pt    Transfers Overall transfer level: Needs assistance Equipment used: Rolling walker (2 wheeled) Transfers: Sit to/from [facility] Sit to Stand: Mod assist;+2 physical assistance Stand  pivot transfers: Min assist;Mod assist;+2 physical assistance       General transfer comment: Physical assist to bring wt up and fwd and to balance in standing.  Assist for balance/support, weight shifting and RW management to stand/pvt to recliner    Balance Overall balance assessment: Needs assistance Sitting-balance support: No upper extremity supported;Feet supported Sitting balance-Leahy Scale: Poor Sitting balance - Comments: progressed to fair balance but initially poor   Standing balance support: Bilateral upper extremity supported;During functional activity Standing balance-Leahy Scale: Poor                             ADL either performed or assessed with clinical judgement   ADL Overall ADL's : Needs assistance/impaired Eating/Feeding: Sitting;Total assistance Eating/Feeding Details (indicate cue type and reason): patient feeding daughter at end of tx, patient drowsy and not tending to task. Grooming: Set up;Wash/dry face;Cueing for sequencing Grooming Details (indicate cue type and reason): patient able to wash face with verbal cues for quality. Upper Body Bathing: Set up;Maximal assistance;Bed level   Lower Body Bathing: Total assistance;Set up;Bed level   Upper Body Dressing : Maximal assistance;Sitting   Lower Body Dressing: Total assistance;Sit to/from stand;+2 for physical assistance   Toilet Transfer: +2 for physical assistance;BSC;Stand-pivot Toilet Transfer Details (indicate cue type and reason): as demonstrated by transfer to recliner Toileting- Clothing Manipulation and Hygiene: +2 for physical assistance;Total assistance;Sit to/from stand               Vision   Vision Assessment?: No apparent visual deficits     Perception     Praxis  Pertinent Vitals/Pain Pain Assessment: No/denies pain     Hand Dominance Right   Extremity/Trunk Assessment Upper Extremity Assessment Upper Extremity Assessment: Generalized weakness    Lower Extremity Assessment Lower Extremity Assessment: Defer to PT evaluation   Cervical / Trunk Assessment Cervical / Trunk Assessment: Kyphotic   Communication Communication Communication: HOH   Cognition Arousal/Alertness: Lethargic (drowsy during tx due to being up all night.) Behavior During Therapy: Flat affect Overall Cognitive Status: History of cognitive impairments - at baseline                                 General Comments: able to follow commands   General Comments       Exercises     Shoulder Instructions      Home Living Family/patient expects to be discharged to:: Skilled nursing facility                                        Prior Functioning/Environment Level of Independence: Needs assistance  Gait / Transfers Assistance Needed: assist of two to move bed<>chair at SNF per dtr     Comments: SNF resident        OT Problem List: Decreased strength;Decreased activity tolerance;Impaired balance (sitting and/or standing);Decreased cognition;Decreased safety awareness;Decreased knowledge of use of DME or AE;Obesity      OT Treatment/Interventions: Self-care/ADL training;Therapeutic exercise;DME and/or AE instruction;Therapeutic activities;Balance training;Patient/family education    OT Goals(Current goals can be found in the care plan section) Acute Rehab OT Goals Patient Stated Goal: NO goals stated by pt.  Dtr would like to return to baseline before covid dx 04/30/20 OT Goal Formulation: With family Time For Goal Achievement: 06/01/20 Potential to Achieve Goals: Fair  OT Frequency: Min 2X/week   Barriers to D/C:            Co-evaluation PT/OT/SLP Co-Evaluation/Treatment: Yes Reason for Co-Treatment: For patient/therapist safety;To address functional/ADL transfers PT goals addressed during session: Mobility/safety with mobility OT goals addressed during session: ADL's and self-care      AM-PAC OT "6 Clicks"  Daily Activity     Outcome Measure Help from another person eating meals?: Total Help from another person taking care of personal grooming?: A Little Help from another person toileting, which includes using toliet, bedpan, or urinal?: Total Help from another person bathing (including washing, rinsing, drying)?: A Lot Help from another person to put on and taking off regular upper body clothing?: A Lot Help from another person to put on and taking off regular lower body clothing?: Total 6 Click Score: 10   End of Session Equipment Utilized During Treatment: Rolling walker Nurse Communication: Mobility status  Activity Tolerance: Patient limited by lethargy Patient left: in chair;with call bell/phone within reach;with chair alarm set  OT Visit Diagnosis: Unsteadiness on feet (R26.81);Muscle weakness (generalized) (M62.81)                Time: 3244-0102 OT Time Calculation (min): 19 min Charges:  OT General Charges $OT Visit: 1 Visit OT Evaluation $OT Eval Moderate Complexity: 1 Mod  Candice Miller, OTR/L [place]  Office [phone] Pager: [place] 05/18/2020, 2:02 PM

## 2020-05-18 NOTE — Evaluation (Signed)
Physical Therapy Evaluation Patient Details Name: Candice Miller MRN: [id number] DOB: 1925/02/17 Today's Date: 05/18/2020   History of Present Illness  Pt admitted from SNF with AMS and with hx of NPH, COPD, DM, Breast CA s/p mastectomy, demnetia and COVID (dx 04/30/20 and now off precautions)  Clinical Impression  Pt admitted as above and presenting with functional mobility limitations 2* generalized weakness, balance deficits, poor endurance and dementia related cognitive deficits.  Pt would benefit from follow up rehab at SNF level.    Follow Up Recommendations SNF    Equipment Recommendations  None recommended by PT    Recommendations for Other Services       Precautions / Restrictions Precautions Precautions: Fall Restrictions Weight Bearing Restrictions: No      Mobility  Bed Mobility Overal bed mobility: Needs Assistance Bed Mobility: Supine to Sit     Supine to sit: Mod assist;+2 for physical assistance     General bed mobility comments: Multimodal cues with intermittent assist from pt    Transfers Overall transfer level: Needs assistance Equipment used: Rolling walker (2 wheeled) Transfers: Sit to/from [facility] Sit to Stand: Mod assist;+2 physical assistance Stand pivot transfers: Min assist;Mod assist;+2 physical assistance       General transfer comment: Physical assist to bring wt up and fwd and to balance in standing.  Assist for balance/support, weight shifting and RW management to stand/pvt to recliner  Ambulation/Gait             General Gait Details: not attempted beyond stand/pvt  Stairs            Wheelchair Mobility    Modified Rankin (Stroke Patients Only)       Balance Overall balance assessment: Needs assistance Sitting-balance support: No upper extremity supported;Feet supported Sitting balance-Leahy Scale: Poor Sitting balance - Comments: progressed to fair balance but initially poor   Standing  balance support: Bilateral upper extremity supported Standing balance-Leahy Scale: Poor                               Pertinent Vitals/Pain Pain Assessment: No/denies pain    Home Living Family/patient expects to be discharged to:: Skilled nursing facility                      Prior Function Level of Independence: Needs assistance   Gait / Transfers Assistance Needed: assist of two to move bed<>chair at SNF per dtr     Comments: SNF resident     Hand Dominance   Dominant Hand: Right    Extremity/Trunk Assessment   Upper Extremity Assessment Upper Extremity Assessment: Defer to OT evaluation    Lower Extremity Assessment Lower Extremity Assessment: Generalized weakness    Cervical / Trunk Assessment Cervical / Trunk Assessment: Kyphotic  Communication   Communication: HOH  Cognition Arousal/Alertness: Lethargic (awake most of the night) Behavior During Therapy: Flat affect Overall Cognitive Status: History of cognitive impairments - at baseline                                        General Comments      Exercises     Assessment/Plan    PT Assessment Patient needs continued PT services  PT Problem List Decreased strength;Decreased activity tolerance;Decreased balance;Decreased mobility;Decreased cognition;Decreased knowledge of use of DME;Decreased safety awareness  PT Treatment Interventions DME instruction;Gait training;Functional mobility training;Therapeutic activities;Therapeutic exercise;Balance training;Patient/family education;Cognitive remediation    PT Goals (Current goals can be found in the Care Plan section)  Acute Rehab PT Goals Patient Stated Goal: NO goals stated by pt.  Dtr would like to return to baseline before covid dx 04/30/20 PT Goal Formulation: With patient/family Time For Goal Achievement: 06/01/20 Potential to Achieve Goals: Fair    Frequency Min 2X/week   Barriers to discharge         Co-evaluation PT/OT/SLP Co-Evaluation/Treatment: Yes Reason for Co-Treatment: For patient/therapist safety PT goals addressed during session: Mobility/safety with mobility OT goals addressed during session: ADL's and self-care       AM-PAC PT "6 Clicks" Mobility  Outcome Measure Help needed turning from your back to your side while in a flat bed without using bedrails?: A Lot Help needed moving from lying on your back to sitting on the side of a flat bed without using bedrails?: A Lot Help needed moving to and from a bed to a chair (including a wheelchair)?: A Lot Help needed standing up from a chair using your arms (e.g., wheelchair or bedside chair)?: A Lot Help needed to walk in hospital room?: A Lot Help needed climbing 3-5 steps with a railing? : Total 6 Click Score: 11    End of Session Equipment Utilized During Treatment: Gait belt;Oxygen (pt is on 2-4L O2 at baseline) Activity Tolerance: Patient tolerated treatment well;Patient limited by fatigue Patient left: in chair;with call bell/phone within reach;with chair alarm set;with family/visitor present Nurse Communication: Mobility status PT Visit Diagnosis: Unsteadiness on feet (R26.81);Muscle weakness (generalized) (M62.81);Difficulty in walking, not elsewhere classified (R26.2)    Time: 9373-4287 PT Time Calculation (min) (ACUTE ONLY): 22 min   Charges:   PT Evaluation $PT Eval Moderate Complexity: 1 Mod          Debe Coder PT Acute Rehabilitation Services Pager [phone] Office [phone]   Candice Miller 05/18/2020, 12:32 PM

## 2020-05-18 NOTE — TOC Initial Note (Addendum)
Transition of Care [facility]) - Initial/Assessment Note    Patient Details  Name: Candice Miller MRN: [id number] Date of Birth: 03-03-1925  [facility]) CM/SW Contact:    Ross Ludwig, LCSW Phone Number: 05/18/2020, 9:52 PM  Clinical Narrative:                  Patient is a 87 year old female who is alert and oriented x1.  Patient has dementia, and lives at [facility] as a long term care resident.  CSW spoke to Elyse Hsu, admissions worker at SNF, and asked if patient is able to return this weekend if she is medically ready for discharge.  Per Elyse Hsu, she is able to return if she is ready.  Elyse Hsu said they would have to have patient return before 4pm, otherwise they will not be able to get her medications.  CSW updated attending physician and bedside nurse.  Expected Discharge Plan: [place] Barriers to Discharge: Continued Medical Work up   Patient Goals and CMS Choice Patient states their goals for this hospitalization and ongoing recovery are:: To return back to [facility] SNF CMS Medicare.gov Compare Post Acute Care list provided to:: Other (Comment Required) Choice offered to / list presented to :  Elyse Hsu admissions worker.)  Expected Discharge Plan and Services Expected Discharge Plan: [place] In-house Referral: Clinical Social Work   Post Acute Care Choice: [place] Living arrangements for the past 2 months: [place]                                      Prior Living Arrangements/Services Living arrangements for the past 2 months: [place] Lives with:: Facility Resident Patient language and need for interpreter reviewed:: Yes Do you feel safe going back to the place where you live?: Yes      Need for Family Participation in Patient Care: Yes (Comment) Care giver support system in place?: Yes (comment)   Criminal Activity/Legal Involvement  Pertinent to Current Situation/Hospitalization: No - Comment as needed  Activities of Daily Living Home Assistive Devices/Equipment: Blood pressure cuff,Nebulizer,Oxygen,Walker (specify type),Wheelchair,Hospital bed,Grab bars around toilet,Grab bars in shower,Hand-held shower hose,Dentures (specify type),CBG Meter (upper/lower dentures, front wheeled walker) ADL Screening (condition at time of admission) Patient's cognitive ability adequate to safely complete daily activities?: No Is the patient deaf or have difficulty hearing?: Yes Does the patient have difficulty seeing, even when wearing glasses/contacts?: No Does the patient have difficulty concentrating, remembering, or making decisions?: Yes Patient able to express need for assistance with ADLs?: No Does the patient have difficulty dressing or bathing?: Yes Independently performs ADLs?: No Communication: Independent Dressing (OT): Dependent Is this a change from baseline?: Change from baseline, expected to last >3 days Grooming: Dependent Is this a change from baseline?: Change from baseline, expected to last >3 days Feeding: Needs assistance Is this a change from baseline?: Change from baseline, expected to last >3 days Bathing: Dependent Is this a change from baseline?: Change from baseline, expected to last >3 days Toileting: Dependent Is this a change from baseline?: Change from baseline, expected to last >3days In/Out Bed: Dependent Is this a change from baseline?: Change from baseline, expected to last >3 days Walks in Home: Dependent Is this a change from baseline?: Change from baseline, expected to last >3 days Does the patient have difficulty walking or climbing  stairs?: Yes (secondary to weakness) Weakness of Legs: Both Weakness of Arms/Hands: Both  Permission Sought/Granted Permission sought to share information with : Facility Contact Representative,Family Supports Permission granted to share information with : Yes,  Release of Information Signed  Share Information with NAME: [name] Daughter [phone]    Severiano Gilbert   [phone]  Permission granted to share info w AGENCY: SNF admissions        Emotional Assessment Appearance:: Appears stated age   Affect (typically observed): Accepting,Calm,Quiet Orientation: : Oriented to Self Alcohol / Substance Use: Not Applicable Psych Involvement: No (comment)  Admission diagnosis:  Lactic acid acidosis [E87.2] Acute respiratory failure with hypoxia ([place]) [J96.01] AKI (acute kidney injury) ([place]) [N17.9] Pneumonia due to COVID-19 virus [U07.1, J12.82] Patient Active Problem List   Diagnosis Date Noted  . Acute lower UTI 05/17/2020  . Pneumonia due to COVID-19 virus 05/16/2020  . Chest pain 01/17/2018  . Acute metabolic encephalopathy 53/29/9242  . Compression fracture of L2 lumbar vertebra ([place]) 11/03/2017  . Hyperlipidemia 09/20/2017  . Paroxysmal SVT (supraventricular tachycardia) ([place]) 08/17/2017  . Chronic back pain 08/17/2017  . Encounter for palliative care   . Goals of care, counseling/discussion   . Acute urinary retention 08/13/2017  . Frequent falls 08/13/2017  . Pressure injury of skin 08/13/2017  . CAP (community acquired pneumonia) 08/12/2017  . Acute respiratory failure with hypoxia ([place]) 06/20/2017  . Accident due to mechanical fall without injury 06/20/2017  . Dementia without behavioral disturbance ([place]) 06/20/2017  . Confusion 06/20/2017  . Hyperlipidemia associated with type 2 diabetes mellitus ([place]) 06/20/2017  . GERD (gastroesophageal reflux disease) 06/20/2017  . Nodule of middle lobe of right lung 06/20/2017  . COPD with acute exacerbation ([place]) 06/15/2017  . Fall at home, initial encounter 06/15/2017  . COPD (chronic obstructive pulmonary disease) ([place]) 12/31/2016  . Diabetes mellitus without complication ([place]) 68/34/1962  . History of breast cancer 12/31/2016  . HTN (hypertension) 12/31/2016  .  Hemorrhoids 12/31/2016  . Nodule of left lung 12/31/2016  . Lower GI bleed 12/30/2016  . Memory loss 10/22/2012   PCP:  Jodi Marble, MD Pharmacy:  No Pharmacies Listed    Social Determinants of Health (SDOH) Interventions    Readmission Risk Interventions No flowsheet data found.

## 2020-05-19 DIAGNOSIS — G9341 Metabolic encephalopathy: Secondary | ICD-10-CM | POA: Diagnosis not present

## 2020-05-19 LAB — GLUCOSE, CAPILLARY
Glucose-Capillary: 113 mg/dL — ABNORMAL HIGH (ref 70–99)
Glucose-Capillary: 182 mg/dL — ABNORMAL HIGH (ref 70–99)
Glucose-Capillary: 169 mg/dL — ABNORMAL HIGH (ref 70–99)
Glucose-Capillary: 124 mg/dL — ABNORMAL HIGH (ref 70–99)

## 2020-05-19 LAB — CBC WITH DIFFERENTIAL/PLATELET
Abs Immature Granulocytes: 0.08 10*3/uL — ABNORMAL HIGH (ref 0.00–0.07)
Basophils Absolute: 0 10*3/uL (ref 0.0–0.1)
Basophils Relative: 0 %
Eosinophils Absolute: 0.2 10*3/uL (ref 0.0–0.5)
Eosinophils Relative: 2 %
HCT: 28.5 % — ABNORMAL LOW (ref 36.0–46.0)
Hemoglobin: 8.5 g/dL — ABNORMAL LOW (ref 12.0–15.0)
Immature Granulocytes: 1 %
Lymphocytes Relative: 22 %
Lymphs Abs: 2.6 10*3/uL (ref 0.7–4.0)
MCH: 28.6 pg (ref 26.0–34.0)
MCHC: 29.8 g/dL — ABNORMAL LOW (ref 30.0–36.0)
MCV: 96 fL (ref 80.0–100.0)
Monocytes Absolute: 0.8 10*3/uL (ref 0.1–1.0)
Monocytes Relative: 7 %
Neutro Abs: 8 10*3/uL — ABNORMAL HIGH (ref 1.7–7.7)
Neutrophils Relative %: 68 %
Platelets: 317 10*3/uL (ref 150–400)
RBC: 2.97 MIL/uL — ABNORMAL LOW (ref 3.87–5.11)
RDW: 13.8 % (ref 11.5–15.5)
WBC: 11.7 10*3/uL — ABNORMAL HIGH (ref 4.0–10.5)
nRBC: 0 % (ref 0.0–0.2)

## 2020-05-19 LAB — COMPREHENSIVE METABOLIC PANEL
ALT: 12 U/L (ref 0–44)
AST: 20 U/L (ref 15–41)
Albumin: 3.2 g/dL — ABNORMAL LOW (ref 3.5–5.0)
Alkaline Phosphatase: 79 U/L (ref 38–126)
Anion gap: 11 (ref 5–15)
BUN: 43 mg/dL — ABNORMAL HIGH (ref 8–23)
CO2: 30 mmol/L (ref 22–32)
Calcium: 8.4 mg/dL — ABNORMAL LOW (ref 8.9–10.3)
Chloride: 105 mmol/L (ref 98–111)
Creatinine, Ser: 0.8 mg/dL (ref 0.44–1.00)
GFR, Estimated: >60 mL/min (ref >60)
Glucose, Bld: 124 mg/dL — ABNORMAL HIGH (ref 70–99)
Potassium: 4 mmol/L (ref 3.5–5.1)
Sodium: 146 mmol/L — ABNORMAL HIGH (ref 135–145)
Total Bilirubin: 0.7 mg/dL (ref 0.3–1.2)
Total Protein: 6.2 g/dL — ABNORMAL LOW (ref 6.5–8.1)

## 2020-05-19 MED ORDER — LEVOFLOXACIN 750 MG PO TABS: 750.0000 mg | ORAL_TABLET | Freq: Every day | ORAL | 0 refills | Status: AC

## 2020-05-19 NOTE — Discharge Summary (Addendum)
Physician Discharge Summary  Candice Miller [id number] DOB: Nov 08, 1924 DOA: 05/16/2020  PCP: Jodi Marble, MD  Admit date: 05/16/2020 Discharge date: 05/19/2020  Admitted From: [facility] Disposition: [facility]  Recommendations for Outpatient Follow-up:  Follow up with PCP in 1-2 weeks Discontinue Metformin and glimepiride; suggest insulin sliding scale for coverage regarding her diabetes given her poor oral intake Discontinued lisinopril and diltiazem for borderline hypotension Continue Levaquin to complete 7-day course for pneumonia/UTI Referral placed to outpatient palliative care Recommended continued goals of care discussions given patient's advanced dementia, poor quality of life and would likely benefit from hospice  Discharge Condition: Stable, but overall prognosis grim and likely hospice candidate CODE STATUS: DNR Diet recommendation: Consistent carbohydrate/heart healthy diet  History of present illness:  Candice Miller is a 87 year old female with past medical history significant for COPD 2 L nasal cannula, dementia, type 2 diabetes mellitus, recent Covid-19 infection on 04/30/2020 who presents to the ED from her SNF with progressive confusion, poor oral intake.   In the ED, temperature 97.7, HR 92, RR 17, BP 135/63, SPO2 97% on 2 L nasal cannula.  Sodium 138, potassium 4.8, chloride 93, CO2 29, glucose 336, BUN 85, creatinine 1.63, magnesium 2.6.  CRP 0.6, lactic acid 5.5.  WBC 10.6, hemoglobin 9.1, platelets 390.  D-dimer 0.76.  Urinalysis with large leukoesterase, negative nitrite, many bacteria, greater than 50 WBCs. Chest x-ray with interstitial lung disease, low lung volumes, obscuration left hemidiaphragm concerning for superimposed infection.  Hospitalist service consulted for further evaluation and management of acute metabolic encephalopathy secondary to UTI versus superimposed pneumonia.  Hospital course:  Acute metabolic encephalopathy,  POA Community-acquired pneumonia, unspecified bacterial, POA E. coli urinary tract infection Lactic acidosis Patient presenting from her skilled nursing facility with progressive confusion which is different from her normal baseline dementia.  Patient was afebrile with mild leukocytosis and elevated lactic acid.  Urinalysis concerning for infection.  Chest x-ray notable for left lower lobe pneumonia.  Procalcitonin elevated 0.17. CT head with no acute intracranial abnormality, noted atropy with small vessel white matter ischemic disease and old right and left cerebellar infacts.  Urine culture positive for E. coli.  Continue Levaquin 750 mg every 48 hours to complete 7-day course of antibiotic therapy.   COPD on 2L [place] at baseline Continue home Symbicort 160-4.5 mcg 2 puffs twice daily, Duo nebs/albuterol as needed   Essential hypertension On diltiazem 180 mg p.o. daily, lisinopril 30 mg p.o. daily, furosemide 40mg  PO qAM and 20mg  PO qOM at home.  Antihypertensives were discontinued secondary to borderline hypotension in the setting of advancing dementia with adult failure to thrive and poor oral intake.  Continue aspirin and statin.  Recommend continue to monitor blood pressure; and recommend higher goal of BP control in the setting of elderly/advanced dementia patient.   HLD: Continue atorvastatin 10 mg p.o. daily   Type 2 diabetes mellitus Hemoglobin A1c 6.5, well controlled.  Home regimen includes Metformin 500 mg every morning and 250 mg every afternoon; Trulicity, and glimepiride 2 mg p.o. daily.  Patient with episode of hypoglycemia on admission, will discontinue home Metformin, Trulicity, glimepiride.  Continue insulin sliding scale for coverage.  Do not recommend tight control of her glucose given her advanced age and dementia.   Advanced dementia Namenda 10 mg p.o. twice daily.  Outpatient referral placed to palliative care.  Continue goals of care discussions with family as patient would  likely benefit from hospice given her advancing dementia and poor quality of life.  Hx Covid-19 viral infection Patient originally diagnosed on 04/30/2020.  Was treated outpatient with monoclonal antibody and antibiotics.  Repeat COVID-19 test on admission, 05/16/2020 negative.  No need for further isolation.    Discharge Diagnoses:  Active Problems:   COPD (chronic obstructive pulmonary disease) (HCC)   Diabetes mellitus without complication (HCC)   HTN (hypertension)   Dementia without behavioral disturbance (HCC)   GERD (gastroesophageal reflux disease)   Hyperlipidemia   Pneumonia due to COVID-19 virus   Acute lower UTI    Discharge Instructions  Discharge Instructions     Amb Referral to Palliative Care   Complete by: As directed    Diet - low sodium heart healthy   Complete by: As directed    Increase activity slowly   Complete by: As directed       Allergies as of 05/19/2020       Reactions   Adhesive [tape] Other (See Comments)   SKIN IS FRAGILE AND WILL TEAR VERY EASILY!!!   Macrodantin [nitrofurantoin] Other (See Comments)   "Made me hurt all over"   Sulfa Antibiotics Swelling   Swelling of the throat, but patient denies that it impaired her breathing   Keflex [cephalexin] Rash        Medication List     STOP taking these medications    dexamethasone 6 MG tablet Commonly known as: DECADRON   diltiazem 180 MG 24 hr capsule Commonly known as: CARDIZEM CD   furosemide 20 MG tablet Commonly known as: LASIX   glimepiride 2 MG tablet Commonly known as: AMARYL   lisinopril 30 MG tablet Commonly known as: ZESTRIL   metFORMIN 500 MG 24 hr tablet Commonly known as: GLUCOPHAGE-XR   Trulicity [id number] Sopn Generic drug: Dulaglutide       TAKE these medications    acetaminophen 500 MG tablet Commonly known as: TYLENOL Take 1,000 mg by mouth at bedtime.   acetaminophen 325 MG tablet Commonly known as: TYLENOL Take 650 mg by mouth in  the morning and at bedtime. 0900 and 1600   albuterol 108 (90 Base) MCG/ACT inhaler Commonly known as: VENTOLIN HFA Inhale 2 puffs into the lungs every 6 (six) hours as needed for wheezing or shortness of breath.   aspirin 81 MG chewable tablet Chew 81 mg by mouth daily.   atorvastatin 10 MG tablet Commonly known as: LIPITOR Take 10 mg by mouth daily.   B Complex Caps Take 1 capsule by mouth daily.   cholecalciferol 1000 units tablet Commonly known as: VITAMIN D Take 1,000 Units by mouth daily.   famotidine 20 MG tablet Commonly known as: PEPCID Take 20 mg by mouth 2 (two) times daily.   fexofenadine 180 MG tablet Commonly known as: ALLEGRA Take 180 mg by mouth as needed for allergies.   fluticasone 50 MCG/ACT nasal spray Commonly known as: FLONASE Place 2 sprays into both nostrils daily.   gabapentin 300 MG capsule Commonly known as: NEURONTIN Take 300 mg by mouth at bedtime.   guaiFENesin 600 MG 12 hr tablet Commonly known as: MUCINEX Take 600 mg by mouth 2 (two) times daily.   HumaLOG KwikPen 100 UNIT/ML KwikPen Generic drug: insulin lispro Inject 2-8 Units into the skin 3 (three) times daily. 200-250= 2 units 251-300=4 units 301-400=8 units   ipratropium-albuterol 0.5-2.5 (3) MG/3ML Soln Commonly known as: DUONEB Take 3 mLs by nebulization 4 (four) times daily.   levofloxacin 750 MG tablet Commonly known as: Levaquin Take 1 tablet (750 mg total) by  mouth daily for 4 days. Start taking on: May 20, 2020   lidocaine 4 % cream Commonly known as: LMX Apply 1 application topically daily.   Lidocaine-Glycerin 5-14.4 % Crea Place 1 application rectally as needed (Constipation). Unsupervised  Self administration   memantine 10 MG tablet Commonly known as: NAMENDA Take 10 mg by mouth 2 (two) times daily.   nitroGLYCERIN 0.4 MG SL tablet Commonly known as: NITROSTAT Place 0.4 mg under the tongue every 5 (five) minutes as needed for chest pain.    OXYGEN Inhale 2 L into the lungs continuous.   Symbicort 160-4.5 MCG/ACT inhaler Generic drug: budesonide-formoterol Inhale 2 puffs into the lungs in the morning and at bedtime.   vitamin C 1000 MG tablet Take 500 mg by mouth daily.   zinc gluconate 50 MG tablet Take 50 mg by mouth daily.        Follow-up Information     Jodi Marble, MD. Schedule an appointment as soon as possible for a visit in 1 week(s).   Specialty: Internal Medicine Contact information: [place] [address] [phone]         Freada Bergeron, MD .   Specialties: Cardiology, Radiology Contact information: [place] [address] [place] [address] [phone]                Allergies  Allergen Reactions   Adhesive [Tape] Other (See Comments)    SKIN IS FRAGILE AND WILL TEAR VERY EASILY!!!   Macrodantin [Nitrofurantoin] Other (See Comments)    "Made me hurt all over"   Sulfa Antibiotics Swelling    Swelling of the throat, but patient denies that it impaired her breathing   Keflex [Cephalexin] Rash    Consultations: None   Procedures/Studies: CT HEAD WO CONTRAST  Result Date: 05/17/2020 CLINICAL DATA:  Mental status changes. EXAM: CT HEAD WITHOUT CONTRAST TECHNIQUE: Contiguous axial images were obtained from the base of the skull through the vertex without intravenous contrast. COMPARISON:  11/02/2017 FINDINGS: Brain: There is no evidence for acute hemorrhage, hydrocephalus, mass lesion, or abnormal extra-axial fluid collection. No definite CT evidence for acute infarction. Diffuse loss of parenchymal volume is consistent with atrophy. Patchy low attenuation in the deep hemispheric and periventricular white matter is nonspecific, but likely reflects chronic microvascular ischemic demyelination. Stable appearance right frontal encephalomalacia, likely from old infarct. Lacunar infarct noted inferior left cerebellum. Vascular: No hyperdense vessel or  unexpected calcification. Skull: No evidence for fracture. No worrisome lytic or sclerotic lesion. Sinuses/Orbits: The visualized paranasal sinuses and mastoid air cells are clear. Visualized portions of the globes and intraorbital fat are unremarkable. Other: None. IMPRESSION: 1. No acute intracranial abnormality. 2. Atrophy with chronic small vessel white matter ischemic disease. 3. 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 Electronically Signed   By: Misty Stanley M.D.   On: 05/17/2020 13:08   DG Chest Port 1 View  Result Date: 05/16/2020 CLINICAL DATA:  Mental status changes with known COVID-19 EXAM: PORTABLE CHEST 1 VIEW COMPARISON:  10/06/2019 FINDINGS: Midline trachea. Numerous leads and wires project over the chest. Normal heart size. Atherosclerosis in the transverse aorta. No pleural effusion or pneumothorax. Lung volumes are low. Basilar and peripheral predominant pulmonary interstitial thickening. Worsened left base aeration with suggestion of superimposed airspace disease, obscuring the left hemidiaphragm. IMPRESSION: Interstitial lung disease, making evaluation for COVID-19 pneumonia challenging. Low lung volumes. New obscuration of the left hemidiaphragm, for which superimposed infection or aspiration are concerns. Atelectasis in the  setting of volume loss could look similar. Aortic Atherosclerosis (ICD10-I70.0). Electronically Signed   By: Abigail Miyamoto M.D.   On: 05/16/2020 13:41     Subjective: Patient seen and examined bedside, resting comfortably.  Pleasantly confused.  Daughter present.  Will be discharging back to SNF today.  Discussed with daughter once again need to consider ultimate goals of care given her advancing dementia and adult failure to thrive.  Would likely benefit from hospice; as her quality of life is extremely poor.  Unable obtain any further ROS from patient due to her underlying dementia.  No acute concerns per nursing staff.  Discharge Exam: Vitals:    05/19/20 0453 05/19/20 1344  BP: 130/89 (!) 152/73  Pulse: (!) 102 96  Resp: 16 19  Temp: 98.2 F (36.8 C) 99.5 F (37.5 C)  SpO2: 99% 100%   Vitals:   05/18/20 1357 05/18/20 2031 05/19/20 0453 05/19/20 1344  BP: 132/74 131/61 130/89 (!) 152/73  Pulse: 98 89 (!) 102 96  Resp: 16 18 16 19   Temp: 98.3 F (36.8 C) 97.8 F (36.6 C) 98.2 F (36.8 C) 99.5 F (37.5 C)  TempSrc: Oral Oral Oral Oral  SpO2: 97% 100% 99% 100%  Weight:      Height:        General: Pt is alert, awake, not in acute distress, pleasantly confused Cardiovascular: RRR, S1/S2 +, no rubs, no gallops Respiratory: CTA bilaterally, no wheezing, no rhonchi, on 2 L nasal cannula which is her baseline Abdominal: Soft, NT, ND, bowel sounds + Extremities: no edema, no cyanosis    The results of significant diagnostics from this hospitalization (including imaging, microbiology, ancillary and laboratory) are listed below for reference.     Microbiology: Recent Results (from the past 240 hour(s))  SARS Coronavirus 2 by RT PCR (hospital order, performed in [facility] hospital lab) Nasopharyngeal Nasopharyngeal Swab     Status: None   Collection Time: 05/16/20  2:46 PM   Specimen: Nasopharyngeal Swab  Result Value Ref Range Status   SARS Coronavirus 2 NEGATIVE NEGATIVE Final    Comment: (NOTE) SARS-CoV-2 target nucleic acids are NOT DETECTED.  The SARS-CoV-2 RNA is generally detectable in upper and lower respiratory specimens during the acute phase of infection. The lowest concentration of SARS-CoV-2 viral copies this assay can detect is 250 copies / mL. A negative result does not preclude SARS-CoV-2 infection and should not be used as the sole basis for treatment or other patient management decisions.  A negative result may occur with improper specimen collection / handling, submission of specimen other than nasopharyngeal swab, presence of viral mutation(s) within the areas targeted by this assay, and  inadequate number of viral copies (<250 copies / mL). A negative result must be combined with clinical observations, patient history, and epidemiological information.  Fact Sheet for Patients:   StrictlyIdeas.no  Fact Sheet for Healthcare Providers: BankingDealers.co.za  This test is not yet approved or  cleared by the [place] FDA and has been authorized for detection and/or diagnosis of SARS-CoV-2 by FDA under an Emergency Use Authorization (EUA).  This EUA will remain in effect (meaning this test can be used) for the duration of the COVID-19 declaration under Section 564(b)(1) of the Act, 21 U.S.C. section 360bbb-3(b)(1), unless the authorization is terminated or revoked sooner.  Performed at [facility], [place] [address] [place], [place] [address]   Culture, Urine     Status: Abnormal (Preliminary result)   Collection Time: 05/16/20  3:00  PM   Specimen: Urine, Catheterized  Result Value Ref Range Status   Specimen Description   Final    URINE, CATHETERIZED Performed at [place] [place] [address] [place], [place] [address]    Special Requests   Final    NONE Performed at [facility], [place] [address] [place], [place] [address]    Culture (A)  Final    >=100,000 COLONIES/mL ESCHERICHIA COLI CULTURE REINCUBATED FOR BETTER GROWTH SUSCEPTIBILITIES TO FOLLOW Performed at [facility], [place] [address] [place], [place] [address]    Report Status PENDING  Incomplete     Labs: BNP (last 3 results) Recent Labs    05/16/20 1715  BNP 65.4   Basic Metabolic Panel: Recent Labs  Lab 05/16/20 1240 05/17/20 0119 05/18/20 0406 05/19/20 0515  NA 138 140 144 146*  K 4.8 4.8 4.0 4.0  CL 93* 97* 101 105  CO2 29 26 32 30  GLUCOSE 336* 332* 175* 124*  BUN 85* 81* 57* 43*  CREATININE 1.63* 1.35* 1.02* 0.80  CALCIUM 9.1 8.8* 8.7* 8.4*  MG  --  2.6* 2.4  --    PHOS  --  4.8* 3.1  --    Liver Function Tests: Recent Labs  Lab 05/16/20 1240 05/17/20 0119 05/18/20 0406 05/19/20 0515  AST 21 20 22 20   ALT 14 14 14 12   ALKPHOS 101 96 80 79  BILITOT 0.4 0.6 0.6 0.7  PROT 7.2 6.8 6.4* 6.2*  ALBUMIN 3.5 3.4* 3.3* 3.2*   No results for input(s): LIPASE, AMYLASE in the last 168 hours. Recent Labs  Lab 05/16/20 1240  AMMONIA 10   CBC: Recent Labs  Lab 05/16/20 1240 05/17/20 0119 05/18/20 0406 05/19/20 0515  WBC 10.6* 11.0* 14.7* 11.7*  NEUTROABS 9.2* 9.7* 10.7* 8.0*  HGB 9.1* 8.7* 8.8* 8.5*  HCT 29.8* 28.5* 28.5* 28.5*  MCV 95.2 95.3 93.8 96.0  PLT 390 367 351 317   Cardiac Enzymes: No results for input(s): CKTOTAL, CKMB, CKMBINDEX, TROPONINI in the last 168 hours. BNP: Invalid input(s): POCBNP CBG: Recent Labs  Lab 05/18/20 1114 05/18/20 1626 05/18/20 2126 05/19/20 0729 05/19/20 1107  GLUCAP 150* 160* 108* 113* 182*   D-Dimer Recent Labs    05/17/20 0119 05/18/20 0406  DDIMER 0.63* 0.88*   Hgb A1c Recent Labs    05/17/20 0119  HGBA1C 6.5*   Lipid Profile No results for input(s): CHOL, HDL, LDLCALC, TRIG, CHOLHDL, LDLDIRECT in the last 72 hours. Thyroid function studies No results for input(s): TSH, T4TOTAL, T3FREE, THYROIDAB in the last 72 hours.  Invalid input(s): FREET3 Anemia work up Recent Labs    05/17/20 0119  FERRITIN 68   Urinalysis    Component Value Date/Time   COLORURINE YELLOW 05/16/2020 1500   APPEARANCEUR CLOUDY (A) 05/16/2020 1500   LABSPEC 1.013 05/16/2020 1500   PHURINE 5.0 05/16/2020 1500   GLUCOSEU NEGATIVE 05/16/2020 1500   HGBUR NEGATIVE 05/16/2020 1500   BILIRUBINUR NEGATIVE 05/16/2020 1500   KETONESUR NEGATIVE 05/16/2020 1500   PROTEINUR NEGATIVE 05/16/2020 1500   NITRITE NEGATIVE 05/16/2020 1500   LEUKOCYTESUR LARGE (A) 05/16/2020 1500   Sepsis Labs Invalid input(s): PROCALCITONIN,  WBC,  LACTICIDVEN Microbiology Recent Results (from the past 240 hour(s))  SARS  Coronavirus 2 by RT PCR (hospital order, performed in [facility] hospital lab) Nasopharyngeal Nasopharyngeal Swab     Status: None   Collection Time: 05/16/20  2:46 PM   Specimen: Nasopharyngeal Swab  Result Value Ref Range  Status   SARS Coronavirus 2 NEGATIVE NEGATIVE Final    Comment: (NOTE) SARS-CoV-2 target nucleic acids are NOT DETECTED.  The SARS-CoV-2 RNA is generally detectable in upper and lower respiratory specimens during the acute phase of infection. The lowest concentration of SARS-CoV-2 viral copies this assay can detect is 250 copies / mL. A negative result does not preclude SARS-CoV-2 infection and should not be used as the sole basis for treatment or other patient management decisions.  A negative result may occur with improper specimen collection / handling, submission of specimen other than nasopharyngeal swab, presence of viral mutation(s) within the areas targeted by this assay, and inadequate number of viral copies (<250 copies / mL). A negative result must be combined with clinical observations, patient history, and epidemiological information.  Fact Sheet for Patients:   StrictlyIdeas.no  Fact Sheet for Healthcare Providers: BankingDealers.co.za  This test is not yet approved or  cleared by the [place] FDA and has been authorized for detection and/or diagnosis of SARS-CoV-2 by FDA under an Emergency Use Authorization (EUA).  This EUA will remain in effect (meaning this test can be used) for the duration of the COVID-19 declaration under Section 564(b)(1) of the Act, 21 U.S.C. section 360bbb-3(b)(1), unless the authorization is terminated or revoked sooner.  Performed at [facility], [place] [address] [place], [place] [address]   Culture, Urine     Status: Abnormal (Preliminary result)   Collection Time: 05/16/20  3:00 PM   Specimen: Urine, Catheterized  Result Value Ref Range Status    Specimen Description   Final    URINE, CATHETERIZED Performed at [place] [address] [place], [place] [address]    Special Requests   Final    NONE Performed at [facility], [place] [address], [place],  [address]    Culture (A)  Final    >=100,000 COLONIES/mL ESCHERICHIA COLI CULTURE REINCUBATED FOR BETTER GROWTH SUSCEPTIBILITIES TO FOLLOW Performed at [facility], [place] [address] [place],  [address]    Report Status PENDING  Incomplete     Time coordinating discharge: Over 30 minutes  SIGNED:   Donnamarie Poag [place] DO  [facility] 05/19/2020, 2:35 PM

## 2020-05-19 NOTE — Progress Notes (Signed)
Notified Dr [place] that central tele called and stated pt had a run of bigeminy pvcs, pt was getting a bath at that time and was being turned. No new orders.

## 2020-05-19 NOTE — Progress Notes (Signed)
REPORT GIVEN TO KIM AT [facility].

## 2020-05-19 NOTE — Progress Notes (Addendum)
Pt is ready to be D/C today. [facility] at [phone], spoke with with nurse Maudie Mercury) and she reports that they are expecting the pt. D/C summary faxed to facility [phone]. Provided RN facility # to call report. Arranged transportation with [facility].

## 2020-05-20 LAB — URINE CULTURE: Culture: >=100000 — AB

## 2020-05-21 DIAGNOSIS — E872 Acidosis: Secondary | ICD-10-CM | POA: Diagnosis not present

## 2020-05-21 DIAGNOSIS — U071 COVID-19: Secondary | ICD-10-CM | POA: Diagnosis not present

## 2020-05-21 DIAGNOSIS — J189 Pneumonia, unspecified organism: Secondary | ICD-10-CM | POA: Diagnosis not present

## 2020-05-21 DIAGNOSIS — G8929 Other chronic pain: Secondary | ICD-10-CM | POA: Diagnosis not present

## 2020-05-21 DIAGNOSIS — J449 Chronic obstructive pulmonary disease, unspecified: Secondary | ICD-10-CM | POA: Diagnosis not present

## 2020-05-21 DIAGNOSIS — I10 Essential (primary) hypertension: Secondary | ICD-10-CM | POA: Diagnosis not present

## 2020-05-21 DIAGNOSIS — N390 Urinary tract infection, site not specified: Secondary | ICD-10-CM | POA: Diagnosis not present

## 2020-05-21 DIAGNOSIS — G9341 Metabolic encephalopathy: Secondary | ICD-10-CM | POA: Diagnosis not present

## 2020-05-21 DIAGNOSIS — K219 Gastro-esophageal reflux disease without esophagitis: Secondary | ICD-10-CM | POA: Diagnosis not present

## 2020-05-22 DIAGNOSIS — J189 Pneumonia, unspecified organism: Secondary | ICD-10-CM | POA: Diagnosis not present

## 2020-05-22 DIAGNOSIS — E1140 Type 2 diabetes mellitus with diabetic neuropathy, unspecified: Secondary | ICD-10-CM | POA: Diagnosis not present

## 2020-05-24 DIAGNOSIS — J449 Chronic obstructive pulmonary disease, unspecified: Secondary | ICD-10-CM | POA: Diagnosis not present

## 2020-05-24 DIAGNOSIS — Z853 Personal history of malignant neoplasm of breast: Secondary | ICD-10-CM | POA: Diagnosis not present

## 2020-05-24 DIAGNOSIS — J9691 Respiratory failure, unspecified with hypoxia: Secondary | ICD-10-CM | POA: Diagnosis not present

## 2020-05-24 DIAGNOSIS — J441 Chronic obstructive pulmonary disease with (acute) exacerbation: Secondary | ICD-10-CM | POA: Diagnosis not present

## 2020-05-24 DIAGNOSIS — F0151 Vascular dementia with behavioral disturbance: Secondary | ICD-10-CM | POA: Diagnosis not present

## 2020-05-24 DIAGNOSIS — E785 Hyperlipidemia, unspecified: Secondary | ICD-10-CM | POA: Diagnosis not present

## 2020-05-24 DIAGNOSIS — G309 Alzheimer's disease, unspecified: Secondary | ICD-10-CM | POA: Diagnosis not present

## 2020-05-24 DIAGNOSIS — I10 Essential (primary) hypertension: Secondary | ICD-10-CM | POA: Diagnosis not present

## 2020-05-24 DIAGNOSIS — F0150 Vascular dementia without behavioral disturbance: Secondary | ICD-10-CM | POA: Diagnosis not present

## 2020-05-24 DIAGNOSIS — K219 Gastro-esophageal reflux disease without esophagitis: Secondary | ICD-10-CM | POA: Diagnosis not present

## 2020-05-24 DIAGNOSIS — M810 Age-related osteoporosis without current pathological fracture: Secondary | ICD-10-CM | POA: Diagnosis not present

## 2020-05-24 DIAGNOSIS — Z794 Long term (current) use of insulin: Secondary | ICD-10-CM | POA: Diagnosis not present

## 2020-05-24 DIAGNOSIS — R911 Solitary pulmonary nodule: Secondary | ICD-10-CM | POA: Diagnosis not present

## 2020-05-24 DIAGNOSIS — E1142 Type 2 diabetes mellitus with diabetic polyneuropathy: Secondary | ICD-10-CM | POA: Diagnosis not present

## 2020-05-24 DIAGNOSIS — E139 Other specified diabetes mellitus without complications: Secondary | ICD-10-CM | POA: Diagnosis not present

## 2020-05-24 DIAGNOSIS — D519 Vitamin B12 deficiency anemia, unspecified: Secondary | ICD-10-CM | POA: Diagnosis not present

## 2020-05-24 DIAGNOSIS — Z8679 Personal history of other diseases of the circulatory system: Secondary | ICD-10-CM | POA: Diagnosis not present

## 2020-05-24 DIAGNOSIS — Z8719 Personal history of other diseases of the digestive system: Secondary | ICD-10-CM | POA: Diagnosis not present

## 2020-05-24 DIAGNOSIS — Z8673 Personal history of transient ischemic attack (TIA), and cerebral infarction without residual deficits: Secondary | ICD-10-CM | POA: Diagnosis not present

## 2020-05-24 DIAGNOSIS — Z9981 Dependence on supplemental oxygen: Secondary | ICD-10-CM | POA: Diagnosis not present

## 2020-05-24 DIAGNOSIS — Z8669 Personal history of other diseases of the nervous system and sense organs: Secondary | ICD-10-CM | POA: Diagnosis not present

## 2020-05-24 DIAGNOSIS — D649 Anemia, unspecified: Secondary | ICD-10-CM | POA: Diagnosis not present

## 2020-05-24 DIAGNOSIS — F0280 Dementia in other diseases classified elsewhere without behavioral disturbance: Secondary | ICD-10-CM | POA: Diagnosis not present

## 2020-05-24 DIAGNOSIS — E119 Type 2 diabetes mellitus without complications: Secondary | ICD-10-CM | POA: Diagnosis not present

## 2020-05-24 DIAGNOSIS — Z9010 Acquired absence of unspecified breast and nipple: Secondary | ICD-10-CM | POA: Diagnosis not present

## 2020-05-24 DIAGNOSIS — J302 Other seasonal allergic rhinitis: Secondary | ICD-10-CM | POA: Diagnosis not present

## 2020-05-24 DIAGNOSIS — I25119 Atherosclerotic heart disease of native coronary artery with unspecified angina pectoris: Secondary | ICD-10-CM | POA: Diagnosis not present

## 2020-05-24 DIAGNOSIS — L89152 Pressure ulcer of sacral region, stage 2: Secondary | ICD-10-CM | POA: Diagnosis not present

## 2020-05-24 DIAGNOSIS — M1990 Unspecified osteoarthritis, unspecified site: Secondary | ICD-10-CM | POA: Diagnosis not present

## 2020-05-24 DIAGNOSIS — R339 Retention of urine, unspecified: Secondary | ICD-10-CM | POA: Diagnosis not present

## 2020-05-24 DIAGNOSIS — E1140 Type 2 diabetes mellitus with diabetic neuropathy, unspecified: Secondary | ICD-10-CM | POA: Diagnosis not present

## 2020-05-24 DIAGNOSIS — G2581 Restless legs syndrome: Secondary | ICD-10-CM | POA: Diagnosis not present

## 2020-05-24 DIAGNOSIS — Z8616 Personal history of COVID-19: Secondary | ICD-10-CM | POA: Diagnosis not present

## 2020-05-25 DIAGNOSIS — E1142 Type 2 diabetes mellitus with diabetic polyneuropathy: Secondary | ICD-10-CM | POA: Diagnosis not present

## 2020-05-25 DIAGNOSIS — J9691 Respiratory failure, unspecified with hypoxia: Secondary | ICD-10-CM | POA: Diagnosis not present

## 2020-05-25 DIAGNOSIS — Z794 Long term (current) use of insulin: Secondary | ICD-10-CM | POA: Diagnosis not present

## 2020-05-25 DIAGNOSIS — Z8673 Personal history of transient ischemic attack (TIA), and cerebral infarction without residual deficits: Secondary | ICD-10-CM | POA: Diagnosis not present

## 2020-05-25 DIAGNOSIS — J449 Chronic obstructive pulmonary disease, unspecified: Secondary | ICD-10-CM | POA: Diagnosis not present

## 2020-05-25 DIAGNOSIS — Z9981 Dependence on supplemental oxygen: Secondary | ICD-10-CM | POA: Diagnosis not present

## 2020-05-28 DIAGNOSIS — Z9981 Dependence on supplemental oxygen: Secondary | ICD-10-CM | POA: Diagnosis not present

## 2020-05-28 DIAGNOSIS — J449 Chronic obstructive pulmonary disease, unspecified: Secondary | ICD-10-CM | POA: Diagnosis not present

## 2020-05-28 DIAGNOSIS — E1142 Type 2 diabetes mellitus with diabetic polyneuropathy: Secondary | ICD-10-CM | POA: Diagnosis not present

## 2020-05-28 DIAGNOSIS — Z794 Long term (current) use of insulin: Secondary | ICD-10-CM | POA: Diagnosis not present

## 2020-05-28 DIAGNOSIS — J9691 Respiratory failure, unspecified with hypoxia: Secondary | ICD-10-CM | POA: Diagnosis not present

## 2020-05-28 DIAGNOSIS — Z8673 Personal history of transient ischemic attack (TIA), and cerebral infarction without residual deficits: Secondary | ICD-10-CM | POA: Diagnosis not present

## 2020-05-30 DIAGNOSIS — D649 Anemia, unspecified: Secondary | ICD-10-CM | POA: Diagnosis not present

## 2020-06-01 DIAGNOSIS — Z794 Long term (current) use of insulin: Secondary | ICD-10-CM | POA: Diagnosis not present

## 2020-06-01 DIAGNOSIS — J9691 Respiratory failure, unspecified with hypoxia: Secondary | ICD-10-CM | POA: Diagnosis not present

## 2020-06-01 DIAGNOSIS — Z9981 Dependence on supplemental oxygen: Secondary | ICD-10-CM | POA: Diagnosis not present

## 2020-06-01 DIAGNOSIS — E1142 Type 2 diabetes mellitus with diabetic polyneuropathy: Secondary | ICD-10-CM | POA: Diagnosis not present

## 2020-06-01 DIAGNOSIS — J449 Chronic obstructive pulmonary disease, unspecified: Secondary | ICD-10-CM | POA: Diagnosis not present

## 2020-06-01 DIAGNOSIS — Z8673 Personal history of transient ischemic attack (TIA), and cerebral infarction without residual deficits: Secondary | ICD-10-CM | POA: Diagnosis not present

## 2020-06-04 DIAGNOSIS — E1142 Type 2 diabetes mellitus with diabetic polyneuropathy: Secondary | ICD-10-CM | POA: Diagnosis not present

## 2020-06-04 DIAGNOSIS — Z794 Long term (current) use of insulin: Secondary | ICD-10-CM | POA: Diagnosis not present

## 2020-06-04 DIAGNOSIS — Z8673 Personal history of transient ischemic attack (TIA), and cerebral infarction without residual deficits: Secondary | ICD-10-CM | POA: Diagnosis not present

## 2020-06-04 DIAGNOSIS — J449 Chronic obstructive pulmonary disease, unspecified: Secondary | ICD-10-CM | POA: Diagnosis not present

## 2020-06-04 DIAGNOSIS — Z9981 Dependence on supplemental oxygen: Secondary | ICD-10-CM | POA: Diagnosis not present

## 2020-06-04 DIAGNOSIS — J9691 Respiratory failure, unspecified with hypoxia: Secondary | ICD-10-CM | POA: Diagnosis not present

## 2020-06-07 DIAGNOSIS — J9691 Respiratory failure, unspecified with hypoxia: Secondary | ICD-10-CM | POA: Diagnosis not present

## 2020-06-07 DIAGNOSIS — Z794 Long term (current) use of insulin: Secondary | ICD-10-CM | POA: Diagnosis not present

## 2020-06-07 DIAGNOSIS — J449 Chronic obstructive pulmonary disease, unspecified: Secondary | ICD-10-CM | POA: Diagnosis not present

## 2020-06-07 DIAGNOSIS — Z9981 Dependence on supplemental oxygen: Secondary | ICD-10-CM | POA: Diagnosis not present

## 2020-06-07 DIAGNOSIS — Z8673 Personal history of transient ischemic attack (TIA), and cerebral infarction without residual deficits: Secondary | ICD-10-CM | POA: Diagnosis not present

## 2020-06-07 DIAGNOSIS — E1142 Type 2 diabetes mellitus with diabetic polyneuropathy: Secondary | ICD-10-CM | POA: Diagnosis not present

## 2020-06-08 DIAGNOSIS — Z8673 Personal history of transient ischemic attack (TIA), and cerebral infarction without residual deficits: Secondary | ICD-10-CM | POA: Diagnosis not present

## 2020-06-08 DIAGNOSIS — J449 Chronic obstructive pulmonary disease, unspecified: Secondary | ICD-10-CM | POA: Diagnosis not present

## 2020-06-08 DIAGNOSIS — E1142 Type 2 diabetes mellitus with diabetic polyneuropathy: Secondary | ICD-10-CM | POA: Diagnosis not present

## 2020-06-08 DIAGNOSIS — Z9981 Dependence on supplemental oxygen: Secondary | ICD-10-CM | POA: Diagnosis not present

## 2020-06-08 DIAGNOSIS — J9691 Respiratory failure, unspecified with hypoxia: Secondary | ICD-10-CM | POA: Diagnosis not present

## 2020-06-08 DIAGNOSIS — Z794 Long term (current) use of insulin: Secondary | ICD-10-CM | POA: Diagnosis not present

## 2020-06-11 DIAGNOSIS — Z8673 Personal history of transient ischemic attack (TIA), and cerebral infarction without residual deficits: Secondary | ICD-10-CM | POA: Diagnosis not present

## 2020-06-11 DIAGNOSIS — J9691 Respiratory failure, unspecified with hypoxia: Secondary | ICD-10-CM | POA: Diagnosis not present

## 2020-06-11 DIAGNOSIS — Z794 Long term (current) use of insulin: Secondary | ICD-10-CM | POA: Diagnosis not present

## 2020-06-11 DIAGNOSIS — J449 Chronic obstructive pulmonary disease, unspecified: Secondary | ICD-10-CM | POA: Diagnosis not present

## 2020-06-11 DIAGNOSIS — Z9981 Dependence on supplemental oxygen: Secondary | ICD-10-CM | POA: Diagnosis not present

## 2020-06-11 DIAGNOSIS — E1142 Type 2 diabetes mellitus with diabetic polyneuropathy: Secondary | ICD-10-CM | POA: Diagnosis not present

## 2020-06-12 DIAGNOSIS — K219 Gastro-esophageal reflux disease without esophagitis: Secondary | ICD-10-CM | POA: Diagnosis not present

## 2020-06-12 DIAGNOSIS — Z8719 Personal history of other diseases of the digestive system: Secondary | ICD-10-CM | POA: Diagnosis not present

## 2020-06-12 DIAGNOSIS — E1142 Type 2 diabetes mellitus with diabetic polyneuropathy: Secondary | ICD-10-CM | POA: Diagnosis not present

## 2020-06-12 DIAGNOSIS — Z8673 Personal history of transient ischemic attack (TIA), and cerebral infarction without residual deficits: Secondary | ICD-10-CM | POA: Diagnosis not present

## 2020-06-12 DIAGNOSIS — R911 Solitary pulmonary nodule: Secondary | ICD-10-CM | POA: Diagnosis not present

## 2020-06-12 DIAGNOSIS — Z853 Personal history of malignant neoplasm of breast: Secondary | ICD-10-CM | POA: Diagnosis not present

## 2020-06-12 DIAGNOSIS — J302 Other seasonal allergic rhinitis: Secondary | ICD-10-CM | POA: Diagnosis not present

## 2020-06-12 DIAGNOSIS — Z9010 Acquired absence of unspecified breast and nipple: Secondary | ICD-10-CM | POA: Diagnosis not present

## 2020-06-12 DIAGNOSIS — Z8616 Personal history of COVID-19: Secondary | ICD-10-CM | POA: Diagnosis not present

## 2020-06-12 DIAGNOSIS — F0280 Dementia in other diseases classified elsewhere without behavioral disturbance: Secondary | ICD-10-CM | POA: Diagnosis not present

## 2020-06-12 DIAGNOSIS — R339 Retention of urine, unspecified: Secondary | ICD-10-CM | POA: Diagnosis not present

## 2020-06-12 DIAGNOSIS — E139 Other specified diabetes mellitus without complications: Secondary | ICD-10-CM | POA: Diagnosis not present

## 2020-06-12 DIAGNOSIS — G2581 Restless legs syndrome: Secondary | ICD-10-CM | POA: Diagnosis not present

## 2020-06-12 DIAGNOSIS — I10 Essential (primary) hypertension: Secondary | ICD-10-CM | POA: Diagnosis not present

## 2020-06-12 DIAGNOSIS — M810 Age-related osteoporosis without current pathological fracture: Secondary | ICD-10-CM | POA: Diagnosis not present

## 2020-06-12 DIAGNOSIS — Z8679 Personal history of other diseases of the circulatory system: Secondary | ICD-10-CM | POA: Diagnosis not present

## 2020-06-12 DIAGNOSIS — F0150 Vascular dementia without behavioral disturbance: Secondary | ICD-10-CM | POA: Diagnosis not present

## 2020-06-12 DIAGNOSIS — E119 Type 2 diabetes mellitus without complications: Secondary | ICD-10-CM | POA: Diagnosis not present

## 2020-06-12 DIAGNOSIS — L89152 Pressure ulcer of sacral region, stage 2: Secondary | ICD-10-CM | POA: Diagnosis not present

## 2020-06-12 DIAGNOSIS — Z9981 Dependence on supplemental oxygen: Secondary | ICD-10-CM | POA: Diagnosis not present

## 2020-06-12 DIAGNOSIS — M1990 Unspecified osteoarthritis, unspecified site: Secondary | ICD-10-CM | POA: Diagnosis not present

## 2020-06-12 DIAGNOSIS — J9691 Respiratory failure, unspecified with hypoxia: Secondary | ICD-10-CM | POA: Diagnosis not present

## 2020-06-12 DIAGNOSIS — I25119 Atherosclerotic heart disease of native coronary artery with unspecified angina pectoris: Secondary | ICD-10-CM | POA: Diagnosis not present

## 2020-06-12 DIAGNOSIS — E1140 Type 2 diabetes mellitus with diabetic neuropathy, unspecified: Secondary | ICD-10-CM | POA: Diagnosis not present

## 2020-06-12 DIAGNOSIS — J441 Chronic obstructive pulmonary disease with (acute) exacerbation: Secondary | ICD-10-CM | POA: Diagnosis not present

## 2020-06-12 DIAGNOSIS — F0151 Vascular dementia with behavioral disturbance: Secondary | ICD-10-CM | POA: Diagnosis not present

## 2020-06-12 DIAGNOSIS — Z794 Long term (current) use of insulin: Secondary | ICD-10-CM | POA: Diagnosis not present

## 2020-06-12 DIAGNOSIS — J449 Chronic obstructive pulmonary disease, unspecified: Secondary | ICD-10-CM | POA: Diagnosis not present

## 2020-06-12 DIAGNOSIS — D649 Anemia, unspecified: Secondary | ICD-10-CM | POA: Diagnosis not present

## 2020-06-12 DIAGNOSIS — E785 Hyperlipidemia, unspecified: Secondary | ICD-10-CM | POA: Diagnosis not present

## 2020-06-12 DIAGNOSIS — Z8669 Personal history of other diseases of the nervous system and sense organs: Secondary | ICD-10-CM | POA: Diagnosis not present

## 2020-06-12 DIAGNOSIS — G309 Alzheimer's disease, unspecified: Secondary | ICD-10-CM | POA: Diagnosis not present

## 2020-06-14 DIAGNOSIS — M1990 Unspecified osteoarthritis, unspecified site: Secondary | ICD-10-CM | POA: Diagnosis not present

## 2020-06-14 DIAGNOSIS — E872 Acidosis: Secondary | ICD-10-CM | POA: Diagnosis not present

## 2020-06-14 DIAGNOSIS — J449 Chronic obstructive pulmonary disease, unspecified: Secondary | ICD-10-CM | POA: Diagnosis not present

## 2020-06-14 DIAGNOSIS — J189 Pneumonia, unspecified organism: Secondary | ICD-10-CM | POA: Diagnosis not present

## 2020-06-14 DIAGNOSIS — J9610 Chronic respiratory failure, unspecified whether with hypoxia or hypercapnia: Secondary | ICD-10-CM | POA: Diagnosis not present

## 2020-06-14 DIAGNOSIS — I10 Essential (primary) hypertension: Secondary | ICD-10-CM | POA: Diagnosis not present

## 2020-06-14 DIAGNOSIS — G8929 Other chronic pain: Secondary | ICD-10-CM | POA: Diagnosis not present

## 2020-06-14 DIAGNOSIS — N390 Urinary tract infection, site not specified: Secondary | ICD-10-CM | POA: Diagnosis not present

## 2020-06-14 DIAGNOSIS — G9341 Metabolic encephalopathy: Secondary | ICD-10-CM | POA: Diagnosis not present

## 2020-06-14 DIAGNOSIS — K219 Gastro-esophageal reflux disease without esophagitis: Secondary | ICD-10-CM | POA: Diagnosis not present

## 2020-06-15 DIAGNOSIS — J449 Chronic obstructive pulmonary disease, unspecified: Secondary | ICD-10-CM | POA: Diagnosis not present

## 2020-06-15 DIAGNOSIS — R059 Cough, unspecified: Secondary | ICD-10-CM | POA: Diagnosis not present

## 2020-06-15 DIAGNOSIS — J9691 Respiratory failure, unspecified with hypoxia: Secondary | ICD-10-CM | POA: Diagnosis not present

## 2020-06-15 DIAGNOSIS — Z9981 Dependence on supplemental oxygen: Secondary | ICD-10-CM | POA: Diagnosis not present

## 2020-06-15 DIAGNOSIS — E1142 Type 2 diabetes mellitus with diabetic polyneuropathy: Secondary | ICD-10-CM | POA: Diagnosis not present

## 2020-06-15 DIAGNOSIS — Z794 Long term (current) use of insulin: Secondary | ICD-10-CM | POA: Diagnosis not present

## 2020-06-15 DIAGNOSIS — Z8673 Personal history of transient ischemic attack (TIA), and cerebral infarction without residual deficits: Secondary | ICD-10-CM | POA: Diagnosis not present

## 2020-06-18 DIAGNOSIS — J449 Chronic obstructive pulmonary disease, unspecified: Secondary | ICD-10-CM | POA: Diagnosis not present

## 2020-06-18 DIAGNOSIS — Z8673 Personal history of transient ischemic attack (TIA), and cerebral infarction without residual deficits: Secondary | ICD-10-CM | POA: Diagnosis not present

## 2020-06-18 DIAGNOSIS — J9691 Respiratory failure, unspecified with hypoxia: Secondary | ICD-10-CM | POA: Diagnosis not present

## 2020-06-18 DIAGNOSIS — E1142 Type 2 diabetes mellitus with diabetic polyneuropathy: Secondary | ICD-10-CM | POA: Diagnosis not present

## 2020-06-18 DIAGNOSIS — Z9981 Dependence on supplemental oxygen: Secondary | ICD-10-CM | POA: Diagnosis not present

## 2020-06-18 DIAGNOSIS — Z794 Long term (current) use of insulin: Secondary | ICD-10-CM | POA: Diagnosis not present

## 2020-06-20 DIAGNOSIS — I272 Pulmonary hypertension, unspecified: Secondary | ICD-10-CM | POA: Diagnosis not present

## 2020-06-20 DIAGNOSIS — K5900 Constipation, unspecified: Secondary | ICD-10-CM | POA: Diagnosis not present

## 2020-06-20 DIAGNOSIS — E119 Type 2 diabetes mellitus without complications: Secondary | ICD-10-CM | POA: Diagnosis not present

## 2020-06-20 DIAGNOSIS — J189 Pneumonia, unspecified organism: Secondary | ICD-10-CM | POA: Diagnosis not present

## 2020-06-20 DIAGNOSIS — I10 Essential (primary) hypertension: Secondary | ICD-10-CM | POA: Diagnosis not present

## 2020-06-20 DIAGNOSIS — J449 Chronic obstructive pulmonary disease, unspecified: Secondary | ICD-10-CM | POA: Diagnosis not present

## 2020-06-20 DIAGNOSIS — G629 Polyneuropathy, unspecified: Secondary | ICD-10-CM | POA: Diagnosis not present

## 2020-06-20 DIAGNOSIS — J9610 Chronic respiratory failure, unspecified whether with hypoxia or hypercapnia: Secondary | ICD-10-CM | POA: Diagnosis not present

## 2020-06-20 DIAGNOSIS — K219 Gastro-esophageal reflux disease without esophagitis: Secondary | ICD-10-CM | POA: Diagnosis not present

## 2020-06-20 DIAGNOSIS — R400 Somnolence: Secondary | ICD-10-CM | POA: Diagnosis not present

## 2020-06-22 DIAGNOSIS — Z9981 Dependence on supplemental oxygen: Secondary | ICD-10-CM | POA: Diagnosis not present

## 2020-06-22 DIAGNOSIS — J449 Chronic obstructive pulmonary disease, unspecified: Secondary | ICD-10-CM | POA: Diagnosis not present

## 2020-06-22 DIAGNOSIS — E1142 Type 2 diabetes mellitus with diabetic polyneuropathy: Secondary | ICD-10-CM | POA: Diagnosis not present

## 2020-06-22 DIAGNOSIS — Z794 Long term (current) use of insulin: Secondary | ICD-10-CM | POA: Diagnosis not present

## 2020-06-22 DIAGNOSIS — Z8673 Personal history of transient ischemic attack (TIA), and cerebral infarction without residual deficits: Secondary | ICD-10-CM | POA: Diagnosis not present

## 2020-06-22 DIAGNOSIS — J9691 Respiratory failure, unspecified with hypoxia: Secondary | ICD-10-CM | POA: Diagnosis not present

## 2020-06-25 DIAGNOSIS — J449 Chronic obstructive pulmonary disease, unspecified: Secondary | ICD-10-CM | POA: Diagnosis not present

## 2020-06-25 DIAGNOSIS — Z8673 Personal history of transient ischemic attack (TIA), and cerebral infarction without residual deficits: Secondary | ICD-10-CM | POA: Diagnosis not present

## 2020-06-25 DIAGNOSIS — E1142 Type 2 diabetes mellitus with diabetic polyneuropathy: Secondary | ICD-10-CM | POA: Diagnosis not present

## 2020-06-25 DIAGNOSIS — Z9981 Dependence on supplemental oxygen: Secondary | ICD-10-CM | POA: Diagnosis not present

## 2020-06-25 DIAGNOSIS — E1140 Type 2 diabetes mellitus with diabetic neuropathy, unspecified: Secondary | ICD-10-CM | POA: Diagnosis not present

## 2020-06-25 DIAGNOSIS — D649 Anemia, unspecified: Secondary | ICD-10-CM | POA: Diagnosis not present

## 2020-06-25 DIAGNOSIS — I10 Essential (primary) hypertension: Secondary | ICD-10-CM | POA: Diagnosis not present

## 2020-06-25 DIAGNOSIS — Z794 Long term (current) use of insulin: Secondary | ICD-10-CM | POA: Diagnosis not present

## 2020-06-25 DIAGNOSIS — J9691 Respiratory failure, unspecified with hypoxia: Secondary | ICD-10-CM | POA: Diagnosis not present

## 2020-06-29 DIAGNOSIS — Z8673 Personal history of transient ischemic attack (TIA), and cerebral infarction without residual deficits: Secondary | ICD-10-CM | POA: Diagnosis not present

## 2020-06-29 DIAGNOSIS — J9691 Respiratory failure, unspecified with hypoxia: Secondary | ICD-10-CM | POA: Diagnosis not present

## 2020-06-29 DIAGNOSIS — E1142 Type 2 diabetes mellitus with diabetic polyneuropathy: Secondary | ICD-10-CM | POA: Diagnosis not present

## 2020-06-29 DIAGNOSIS — Z9981 Dependence on supplemental oxygen: Secondary | ICD-10-CM | POA: Diagnosis not present

## 2020-06-29 DIAGNOSIS — Z794 Long term (current) use of insulin: Secondary | ICD-10-CM | POA: Diagnosis not present

## 2020-06-29 DIAGNOSIS — J449 Chronic obstructive pulmonary disease, unspecified: Secondary | ICD-10-CM | POA: Diagnosis not present

## 2020-07-02 DIAGNOSIS — J9691 Respiratory failure, unspecified with hypoxia: Secondary | ICD-10-CM | POA: Diagnosis not present

## 2020-07-02 DIAGNOSIS — Z8673 Personal history of transient ischemic attack (TIA), and cerebral infarction without residual deficits: Secondary | ICD-10-CM | POA: Diagnosis not present

## 2020-07-02 DIAGNOSIS — Z9981 Dependence on supplemental oxygen: Secondary | ICD-10-CM | POA: Diagnosis not present

## 2020-07-02 DIAGNOSIS — E1142 Type 2 diabetes mellitus with diabetic polyneuropathy: Secondary | ICD-10-CM | POA: Diagnosis not present

## 2020-07-02 DIAGNOSIS — J449 Chronic obstructive pulmonary disease, unspecified: Secondary | ICD-10-CM | POA: Diagnosis not present

## 2020-07-02 DIAGNOSIS — Z794 Long term (current) use of insulin: Secondary | ICD-10-CM | POA: Diagnosis not present

## 2020-07-04 DIAGNOSIS — J449 Chronic obstructive pulmonary disease, unspecified: Secondary | ICD-10-CM | POA: Diagnosis not present

## 2020-07-04 DIAGNOSIS — J9691 Respiratory failure, unspecified with hypoxia: Secondary | ICD-10-CM | POA: Diagnosis not present

## 2020-07-04 DIAGNOSIS — Z794 Long term (current) use of insulin: Secondary | ICD-10-CM | POA: Diagnosis not present

## 2020-07-04 DIAGNOSIS — E1142 Type 2 diabetes mellitus with diabetic polyneuropathy: Secondary | ICD-10-CM | POA: Diagnosis not present

## 2020-07-04 DIAGNOSIS — Z9981 Dependence on supplemental oxygen: Secondary | ICD-10-CM | POA: Diagnosis not present

## 2020-07-04 DIAGNOSIS — Z8673 Personal history of transient ischemic attack (TIA), and cerebral infarction without residual deficits: Secondary | ICD-10-CM | POA: Diagnosis not present

## 2020-07-05 ENCOUNTER — Ambulatory Visit: Payer: Medicare Other | Admitting: Cardiology

## 2020-07-06 DIAGNOSIS — Z794 Long term (current) use of insulin: Secondary | ICD-10-CM | POA: Diagnosis not present

## 2020-07-06 DIAGNOSIS — E1142 Type 2 diabetes mellitus with diabetic polyneuropathy: Secondary | ICD-10-CM | POA: Diagnosis not present

## 2020-07-06 DIAGNOSIS — J449 Chronic obstructive pulmonary disease, unspecified: Secondary | ICD-10-CM | POA: Diagnosis not present

## 2020-07-06 DIAGNOSIS — Z8673 Personal history of transient ischemic attack (TIA), and cerebral infarction without residual deficits: Secondary | ICD-10-CM | POA: Diagnosis not present

## 2020-07-06 DIAGNOSIS — Z9981 Dependence on supplemental oxygen: Secondary | ICD-10-CM | POA: Diagnosis not present

## 2020-07-06 DIAGNOSIS — J9691 Respiratory failure, unspecified with hypoxia: Secondary | ICD-10-CM | POA: Diagnosis not present

## 2020-07-09 DIAGNOSIS — Z8673 Personal history of transient ischemic attack (TIA), and cerebral infarction without residual deficits: Secondary | ICD-10-CM | POA: Diagnosis not present

## 2020-07-09 DIAGNOSIS — J449 Chronic obstructive pulmonary disease, unspecified: Secondary | ICD-10-CM | POA: Diagnosis not present

## 2020-07-09 DIAGNOSIS — E1142 Type 2 diabetes mellitus with diabetic polyneuropathy: Secondary | ICD-10-CM | POA: Diagnosis not present

## 2020-07-09 DIAGNOSIS — Z794 Long term (current) use of insulin: Secondary | ICD-10-CM | POA: Diagnosis not present

## 2020-07-09 DIAGNOSIS — J9691 Respiratory failure, unspecified with hypoxia: Secondary | ICD-10-CM | POA: Diagnosis not present

## 2020-07-09 DIAGNOSIS — Z9981 Dependence on supplemental oxygen: Secondary | ICD-10-CM | POA: Diagnosis not present

## 2020-07-11 DIAGNOSIS — Z794 Long term (current) use of insulin: Secondary | ICD-10-CM | POA: Diagnosis not present

## 2020-07-11 DIAGNOSIS — Z8673 Personal history of transient ischemic attack (TIA), and cerebral infarction without residual deficits: Secondary | ICD-10-CM | POA: Diagnosis not present

## 2020-07-11 DIAGNOSIS — E1142 Type 2 diabetes mellitus with diabetic polyneuropathy: Secondary | ICD-10-CM | POA: Diagnosis not present

## 2020-07-11 DIAGNOSIS — Z9981 Dependence on supplemental oxygen: Secondary | ICD-10-CM | POA: Diagnosis not present

## 2020-07-11 DIAGNOSIS — J449 Chronic obstructive pulmonary disease, unspecified: Secondary | ICD-10-CM | POA: Diagnosis not present

## 2020-07-11 DIAGNOSIS — J9691 Respiratory failure, unspecified with hypoxia: Secondary | ICD-10-CM | POA: Diagnosis not present

## 2020-07-12 DIAGNOSIS — I272 Pulmonary hypertension, unspecified: Secondary | ICD-10-CM | POA: Diagnosis not present

## 2020-07-12 DIAGNOSIS — G8929 Other chronic pain: Secondary | ICD-10-CM | POA: Diagnosis not present

## 2020-07-12 DIAGNOSIS — E119 Type 2 diabetes mellitus without complications: Secondary | ICD-10-CM | POA: Diagnosis not present

## 2020-07-12 DIAGNOSIS — J9610 Chronic respiratory failure, unspecified whether with hypoxia or hypercapnia: Secondary | ICD-10-CM | POA: Diagnosis not present

## 2020-07-12 DIAGNOSIS — G629 Polyneuropathy, unspecified: Secondary | ICD-10-CM | POA: Diagnosis not present

## 2020-07-12 DIAGNOSIS — K5900 Constipation, unspecified: Secondary | ICD-10-CM | POA: Diagnosis not present

## 2020-07-12 DIAGNOSIS — J449 Chronic obstructive pulmonary disease, unspecified: Secondary | ICD-10-CM | POA: Diagnosis not present

## 2020-07-13 DIAGNOSIS — Z8679 Personal history of other diseases of the circulatory system: Secondary | ICD-10-CM | POA: Diagnosis not present

## 2020-07-13 DIAGNOSIS — L89152 Pressure ulcer of sacral region, stage 2: Secondary | ICD-10-CM | POA: Diagnosis not present

## 2020-07-13 DIAGNOSIS — Z853 Personal history of malignant neoplasm of breast: Secondary | ICD-10-CM | POA: Diagnosis not present

## 2020-07-13 DIAGNOSIS — K219 Gastro-esophageal reflux disease without esophagitis: Secondary | ICD-10-CM | POA: Diagnosis not present

## 2020-07-13 DIAGNOSIS — G309 Alzheimer's disease, unspecified: Secondary | ICD-10-CM | POA: Diagnosis not present

## 2020-07-13 DIAGNOSIS — M810 Age-related osteoporosis without current pathological fracture: Secondary | ICD-10-CM | POA: Diagnosis not present

## 2020-07-13 DIAGNOSIS — Z8719 Personal history of other diseases of the digestive system: Secondary | ICD-10-CM | POA: Diagnosis not present

## 2020-07-13 DIAGNOSIS — E1142 Type 2 diabetes mellitus with diabetic polyneuropathy: Secondary | ICD-10-CM | POA: Diagnosis not present

## 2020-07-13 DIAGNOSIS — J9691 Respiratory failure, unspecified with hypoxia: Secondary | ICD-10-CM | POA: Diagnosis not present

## 2020-07-13 DIAGNOSIS — Z8616 Personal history of COVID-19: Secondary | ICD-10-CM | POA: Diagnosis not present

## 2020-07-13 DIAGNOSIS — I10 Essential (primary) hypertension: Secondary | ICD-10-CM | POA: Diagnosis not present

## 2020-07-13 DIAGNOSIS — R339 Retention of urine, unspecified: Secondary | ICD-10-CM | POA: Diagnosis not present

## 2020-07-13 DIAGNOSIS — Z794 Long term (current) use of insulin: Secondary | ICD-10-CM | POA: Diagnosis not present

## 2020-07-13 DIAGNOSIS — F0280 Dementia in other diseases classified elsewhere without behavioral disturbance: Secondary | ICD-10-CM | POA: Diagnosis not present

## 2020-07-13 DIAGNOSIS — Z9981 Dependence on supplemental oxygen: Secondary | ICD-10-CM | POA: Diagnosis not present

## 2020-07-13 DIAGNOSIS — E785 Hyperlipidemia, unspecified: Secondary | ICD-10-CM | POA: Diagnosis not present

## 2020-07-13 DIAGNOSIS — Z8669 Personal history of other diseases of the nervous system and sense organs: Secondary | ICD-10-CM | POA: Diagnosis not present

## 2020-07-13 DIAGNOSIS — R911 Solitary pulmonary nodule: Secondary | ICD-10-CM | POA: Diagnosis not present

## 2020-07-13 DIAGNOSIS — F0150 Vascular dementia without behavioral disturbance: Secondary | ICD-10-CM | POA: Diagnosis not present

## 2020-07-13 DIAGNOSIS — Z9010 Acquired absence of unspecified breast and nipple: Secondary | ICD-10-CM | POA: Diagnosis not present

## 2020-07-13 DIAGNOSIS — G2581 Restless legs syndrome: Secondary | ICD-10-CM | POA: Diagnosis not present

## 2020-07-13 DIAGNOSIS — J302 Other seasonal allergic rhinitis: Secondary | ICD-10-CM | POA: Diagnosis not present

## 2020-07-13 DIAGNOSIS — Z8673 Personal history of transient ischemic attack (TIA), and cerebral infarction without residual deficits: Secondary | ICD-10-CM | POA: Diagnosis not present

## 2020-07-13 DIAGNOSIS — I25119 Atherosclerotic heart disease of native coronary artery with unspecified angina pectoris: Secondary | ICD-10-CM | POA: Diagnosis not present

## 2020-07-13 DIAGNOSIS — J449 Chronic obstructive pulmonary disease, unspecified: Secondary | ICD-10-CM | POA: Diagnosis not present

## 2020-07-15 DIAGNOSIS — J449 Chronic obstructive pulmonary disease, unspecified: Secondary | ICD-10-CM | POA: Diagnosis not present

## 2020-07-15 DIAGNOSIS — Z794 Long term (current) use of insulin: Secondary | ICD-10-CM | POA: Diagnosis not present

## 2020-07-15 DIAGNOSIS — E1142 Type 2 diabetes mellitus with diabetic polyneuropathy: Secondary | ICD-10-CM | POA: Diagnosis not present

## 2020-07-15 DIAGNOSIS — Z8673 Personal history of transient ischemic attack (TIA), and cerebral infarction without residual deficits: Secondary | ICD-10-CM | POA: Diagnosis not present

## 2020-07-15 DIAGNOSIS — Z9981 Dependence on supplemental oxygen: Secondary | ICD-10-CM | POA: Diagnosis not present

## 2020-07-15 DIAGNOSIS — J9691 Respiratory failure, unspecified with hypoxia: Secondary | ICD-10-CM | POA: Diagnosis not present

## 2020-07-16 DIAGNOSIS — E1142 Type 2 diabetes mellitus with diabetic polyneuropathy: Secondary | ICD-10-CM | POA: Diagnosis not present

## 2020-07-16 DIAGNOSIS — J9691 Respiratory failure, unspecified with hypoxia: Secondary | ICD-10-CM | POA: Diagnosis not present

## 2020-07-16 DIAGNOSIS — J449 Chronic obstructive pulmonary disease, unspecified: Secondary | ICD-10-CM | POA: Diagnosis not present

## 2020-07-16 DIAGNOSIS — Z9981 Dependence on supplemental oxygen: Secondary | ICD-10-CM | POA: Diagnosis not present

## 2020-07-16 DIAGNOSIS — Z794 Long term (current) use of insulin: Secondary | ICD-10-CM | POA: Diagnosis not present

## 2020-07-16 DIAGNOSIS — Z8673 Personal history of transient ischemic attack (TIA), and cerebral infarction without residual deficits: Secondary | ICD-10-CM | POA: Diagnosis not present

## 2020-07-17 DIAGNOSIS — J9691 Respiratory failure, unspecified with hypoxia: Secondary | ICD-10-CM | POA: Diagnosis not present

## 2020-07-17 DIAGNOSIS — Z9981 Dependence on supplemental oxygen: Secondary | ICD-10-CM | POA: Diagnosis not present

## 2020-07-17 DIAGNOSIS — J449 Chronic obstructive pulmonary disease, unspecified: Secondary | ICD-10-CM | POA: Diagnosis not present

## 2020-07-17 DIAGNOSIS — Z794 Long term (current) use of insulin: Secondary | ICD-10-CM | POA: Diagnosis not present

## 2020-07-17 DIAGNOSIS — E1142 Type 2 diabetes mellitus with diabetic polyneuropathy: Secondary | ICD-10-CM | POA: Diagnosis not present

## 2020-07-17 DIAGNOSIS — Z8673 Personal history of transient ischemic attack (TIA), and cerebral infarction without residual deficits: Secondary | ICD-10-CM | POA: Diagnosis not present

## 2020-07-18 DIAGNOSIS — J9610 Chronic respiratory failure, unspecified whether with hypoxia or hypercapnia: Secondary | ICD-10-CM | POA: Diagnosis not present

## 2020-07-18 DIAGNOSIS — K5900 Constipation, unspecified: Secondary | ICD-10-CM | POA: Diagnosis not present

## 2020-07-18 DIAGNOSIS — D649 Anemia, unspecified: Secondary | ICD-10-CM | POA: Diagnosis not present

## 2020-07-18 DIAGNOSIS — F0390 Unspecified dementia without behavioral disturbance: Secondary | ICD-10-CM | POA: Diagnosis not present

## 2020-07-18 DIAGNOSIS — G8929 Other chronic pain: Secondary | ICD-10-CM | POA: Diagnosis not present

## 2020-07-18 DIAGNOSIS — E119 Type 2 diabetes mellitus without complications: Secondary | ICD-10-CM | POA: Diagnosis not present

## 2020-07-18 DIAGNOSIS — K219 Gastro-esophageal reflux disease without esophagitis: Secondary | ICD-10-CM | POA: Diagnosis not present

## 2020-07-18 DIAGNOSIS — R531 Weakness: Secondary | ICD-10-CM | POA: Diagnosis not present

## 2020-07-18 DIAGNOSIS — J449 Chronic obstructive pulmonary disease, unspecified: Secondary | ICD-10-CM | POA: Diagnosis not present

## 2020-07-18 DIAGNOSIS — G629 Polyneuropathy, unspecified: Secondary | ICD-10-CM | POA: Diagnosis not present

## 2020-07-19 DIAGNOSIS — M79672 Pain in left foot: Secondary | ICD-10-CM | POA: Diagnosis not present

## 2020-07-19 DIAGNOSIS — E1140 Type 2 diabetes mellitus with diabetic neuropathy, unspecified: Secondary | ICD-10-CM | POA: Diagnosis not present

## 2020-07-19 DIAGNOSIS — B351 Tinea unguium: Secondary | ICD-10-CM | POA: Diagnosis not present

## 2020-07-20 DIAGNOSIS — Z794 Long term (current) use of insulin: Secondary | ICD-10-CM | POA: Diagnosis not present

## 2020-07-20 DIAGNOSIS — E1142 Type 2 diabetes mellitus with diabetic polyneuropathy: Secondary | ICD-10-CM | POA: Diagnosis not present

## 2020-07-20 DIAGNOSIS — J449 Chronic obstructive pulmonary disease, unspecified: Secondary | ICD-10-CM | POA: Diagnosis not present

## 2020-07-20 DIAGNOSIS — Z9981 Dependence on supplemental oxygen: Secondary | ICD-10-CM | POA: Diagnosis not present

## 2020-07-20 DIAGNOSIS — Z8673 Personal history of transient ischemic attack (TIA), and cerebral infarction without residual deficits: Secondary | ICD-10-CM | POA: Diagnosis not present

## 2020-07-20 DIAGNOSIS — J9691 Respiratory failure, unspecified with hypoxia: Secondary | ICD-10-CM | POA: Diagnosis not present

## 2020-07-23 DIAGNOSIS — E1142 Type 2 diabetes mellitus with diabetic polyneuropathy: Secondary | ICD-10-CM | POA: Diagnosis not present

## 2020-07-23 DIAGNOSIS — J9691 Respiratory failure, unspecified with hypoxia: Secondary | ICD-10-CM | POA: Diagnosis not present

## 2020-07-23 DIAGNOSIS — Z9981 Dependence on supplemental oxygen: Secondary | ICD-10-CM | POA: Diagnosis not present

## 2020-07-23 DIAGNOSIS — Z8673 Personal history of transient ischemic attack (TIA), and cerebral infarction without residual deficits: Secondary | ICD-10-CM | POA: Diagnosis not present

## 2020-07-23 DIAGNOSIS — J449 Chronic obstructive pulmonary disease, unspecified: Secondary | ICD-10-CM | POA: Diagnosis not present

## 2020-07-23 DIAGNOSIS — Z794 Long term (current) use of insulin: Secondary | ICD-10-CM | POA: Diagnosis not present

## 2020-07-26 DIAGNOSIS — J449 Chronic obstructive pulmonary disease, unspecified: Secondary | ICD-10-CM | POA: Diagnosis not present

## 2020-07-26 DIAGNOSIS — Z9981 Dependence on supplemental oxygen: Secondary | ICD-10-CM | POA: Diagnosis not present

## 2020-07-26 DIAGNOSIS — Z8673 Personal history of transient ischemic attack (TIA), and cerebral infarction without residual deficits: Secondary | ICD-10-CM | POA: Diagnosis not present

## 2020-07-26 DIAGNOSIS — E1142 Type 2 diabetes mellitus with diabetic polyneuropathy: Secondary | ICD-10-CM | POA: Diagnosis not present

## 2020-07-26 DIAGNOSIS — Z794 Long term (current) use of insulin: Secondary | ICD-10-CM | POA: Diagnosis not present

## 2020-07-26 DIAGNOSIS — J9691 Respiratory failure, unspecified with hypoxia: Secondary | ICD-10-CM | POA: Diagnosis not present

## 2020-07-27 DIAGNOSIS — Z9981 Dependence on supplemental oxygen: Secondary | ICD-10-CM | POA: Diagnosis not present

## 2020-07-27 DIAGNOSIS — Z8673 Personal history of transient ischemic attack (TIA), and cerebral infarction without residual deficits: Secondary | ICD-10-CM | POA: Diagnosis not present

## 2020-07-27 DIAGNOSIS — E1142 Type 2 diabetes mellitus with diabetic polyneuropathy: Secondary | ICD-10-CM | POA: Diagnosis not present

## 2020-07-27 DIAGNOSIS — J9691 Respiratory failure, unspecified with hypoxia: Secondary | ICD-10-CM | POA: Diagnosis not present

## 2020-07-27 DIAGNOSIS — J449 Chronic obstructive pulmonary disease, unspecified: Secondary | ICD-10-CM | POA: Diagnosis not present

## 2020-07-27 DIAGNOSIS — Z794 Long term (current) use of insulin: Secondary | ICD-10-CM | POA: Diagnosis not present

## 2020-07-30 DIAGNOSIS — Z9981 Dependence on supplemental oxygen: Secondary | ICD-10-CM | POA: Diagnosis not present

## 2020-07-30 DIAGNOSIS — E1142 Type 2 diabetes mellitus with diabetic polyneuropathy: Secondary | ICD-10-CM | POA: Diagnosis not present

## 2020-07-30 DIAGNOSIS — J449 Chronic obstructive pulmonary disease, unspecified: Secondary | ICD-10-CM | POA: Diagnosis not present

## 2020-07-30 DIAGNOSIS — J9691 Respiratory failure, unspecified with hypoxia: Secondary | ICD-10-CM | POA: Diagnosis not present

## 2020-07-30 DIAGNOSIS — Z794 Long term (current) use of insulin: Secondary | ICD-10-CM | POA: Diagnosis not present

## 2020-07-30 DIAGNOSIS — Z8673 Personal history of transient ischemic attack (TIA), and cerebral infarction without residual deficits: Secondary | ICD-10-CM | POA: Diagnosis not present

## 2020-07-31 DIAGNOSIS — J9691 Respiratory failure, unspecified with hypoxia: Secondary | ICD-10-CM | POA: Diagnosis not present

## 2020-07-31 DIAGNOSIS — J449 Chronic obstructive pulmonary disease, unspecified: Secondary | ICD-10-CM | POA: Diagnosis not present

## 2020-07-31 DIAGNOSIS — Z9981 Dependence on supplemental oxygen: Secondary | ICD-10-CM | POA: Diagnosis not present

## 2020-07-31 DIAGNOSIS — E1142 Type 2 diabetes mellitus with diabetic polyneuropathy: Secondary | ICD-10-CM | POA: Diagnosis not present

## 2020-07-31 DIAGNOSIS — Z794 Long term (current) use of insulin: Secondary | ICD-10-CM | POA: Diagnosis not present

## 2020-07-31 DIAGNOSIS — Z8673 Personal history of transient ischemic attack (TIA), and cerebral infarction without residual deficits: Secondary | ICD-10-CM | POA: Diagnosis not present

## 2020-08-01 DIAGNOSIS — J9691 Respiratory failure, unspecified with hypoxia: Secondary | ICD-10-CM | POA: Diagnosis not present

## 2020-08-01 DIAGNOSIS — J449 Chronic obstructive pulmonary disease, unspecified: Secondary | ICD-10-CM | POA: Diagnosis not present

## 2020-08-01 DIAGNOSIS — E1142 Type 2 diabetes mellitus with diabetic polyneuropathy: Secondary | ICD-10-CM | POA: Diagnosis not present

## 2020-08-01 DIAGNOSIS — Z9981 Dependence on supplemental oxygen: Secondary | ICD-10-CM | POA: Diagnosis not present

## 2020-08-01 DIAGNOSIS — Z8673 Personal history of transient ischemic attack (TIA), and cerebral infarction without residual deficits: Secondary | ICD-10-CM | POA: Diagnosis not present

## 2020-08-01 DIAGNOSIS — Z794 Long term (current) use of insulin: Secondary | ICD-10-CM | POA: Diagnosis not present

## 2020-08-03 DIAGNOSIS — Z8673 Personal history of transient ischemic attack (TIA), and cerebral infarction without residual deficits: Secondary | ICD-10-CM | POA: Diagnosis not present

## 2020-08-03 DIAGNOSIS — J449 Chronic obstructive pulmonary disease, unspecified: Secondary | ICD-10-CM | POA: Diagnosis not present

## 2020-08-03 DIAGNOSIS — E1142 Type 2 diabetes mellitus with diabetic polyneuropathy: Secondary | ICD-10-CM | POA: Diagnosis not present

## 2020-08-03 DIAGNOSIS — Z9981 Dependence on supplemental oxygen: Secondary | ICD-10-CM | POA: Diagnosis not present

## 2020-08-03 DIAGNOSIS — J9691 Respiratory failure, unspecified with hypoxia: Secondary | ICD-10-CM | POA: Diagnosis not present

## 2020-08-03 DIAGNOSIS — Z794 Long term (current) use of insulin: Secondary | ICD-10-CM | POA: Diagnosis not present

## 2020-08-06 DIAGNOSIS — J449 Chronic obstructive pulmonary disease, unspecified: Secondary | ICD-10-CM | POA: Diagnosis not present

## 2020-08-06 DIAGNOSIS — M810 Age-related osteoporosis without current pathological fracture: Secondary | ICD-10-CM | POA: Diagnosis not present

## 2020-08-06 DIAGNOSIS — I10 Essential (primary) hypertension: Secondary | ICD-10-CM | POA: Diagnosis not present

## 2020-08-06 DIAGNOSIS — D649 Anemia, unspecified: Secondary | ICD-10-CM | POA: Diagnosis not present

## 2020-08-06 DIAGNOSIS — E119 Type 2 diabetes mellitus without complications: Secondary | ICD-10-CM | POA: Diagnosis not present

## 2020-08-06 DIAGNOSIS — E1140 Type 2 diabetes mellitus with diabetic neuropathy, unspecified: Secondary | ICD-10-CM | POA: Diagnosis not present

## 2020-08-06 DIAGNOSIS — J9691 Respiratory failure, unspecified with hypoxia: Secondary | ICD-10-CM | POA: Diagnosis not present

## 2020-08-06 DIAGNOSIS — E785 Hyperlipidemia, unspecified: Secondary | ICD-10-CM | POA: Diagnosis not present

## 2020-08-06 DIAGNOSIS — Z9981 Dependence on supplemental oxygen: Secondary | ICD-10-CM | POA: Diagnosis not present

## 2020-08-06 DIAGNOSIS — F0151 Vascular dementia with behavioral disturbance: Secondary | ICD-10-CM | POA: Diagnosis not present

## 2020-08-06 DIAGNOSIS — E139 Other specified diabetes mellitus without complications: Secondary | ICD-10-CM | POA: Diagnosis not present

## 2020-08-06 DIAGNOSIS — J441 Chronic obstructive pulmonary disease with (acute) exacerbation: Secondary | ICD-10-CM | POA: Diagnosis not present

## 2020-08-06 DIAGNOSIS — Z794 Long term (current) use of insulin: Secondary | ICD-10-CM | POA: Diagnosis not present

## 2020-08-06 DIAGNOSIS — F0150 Vascular dementia without behavioral disturbance: Secondary | ICD-10-CM | POA: Diagnosis not present

## 2020-08-06 DIAGNOSIS — Z8673 Personal history of transient ischemic attack (TIA), and cerebral infarction without residual deficits: Secondary | ICD-10-CM | POA: Diagnosis not present

## 2020-08-06 DIAGNOSIS — E1142 Type 2 diabetes mellitus with diabetic polyneuropathy: Secondary | ICD-10-CM | POA: Diagnosis not present

## 2020-08-06 DIAGNOSIS — M1990 Unspecified osteoarthritis, unspecified site: Secondary | ICD-10-CM | POA: Diagnosis not present

## 2020-08-07 DIAGNOSIS — Z23 Encounter for immunization: Secondary | ICD-10-CM | POA: Diagnosis not present

## 2020-08-09 DIAGNOSIS — Z9981 Dependence on supplemental oxygen: Secondary | ICD-10-CM | POA: Diagnosis not present

## 2020-08-09 DIAGNOSIS — J9691 Respiratory failure, unspecified with hypoxia: Secondary | ICD-10-CM | POA: Diagnosis not present

## 2020-08-09 DIAGNOSIS — Z794 Long term (current) use of insulin: Secondary | ICD-10-CM | POA: Diagnosis not present

## 2020-08-09 DIAGNOSIS — J449 Chronic obstructive pulmonary disease, unspecified: Secondary | ICD-10-CM | POA: Diagnosis not present

## 2020-08-09 DIAGNOSIS — E1142 Type 2 diabetes mellitus with diabetic polyneuropathy: Secondary | ICD-10-CM | POA: Diagnosis not present

## 2020-08-09 DIAGNOSIS — Z8673 Personal history of transient ischemic attack (TIA), and cerebral infarction without residual deficits: Secondary | ICD-10-CM | POA: Diagnosis not present

## 2020-08-10 DIAGNOSIS — R197 Diarrhea, unspecified: Secondary | ICD-10-CM | POA: Diagnosis not present

## 2020-08-10 DIAGNOSIS — Z9981 Dependence on supplemental oxygen: Secondary | ICD-10-CM | POA: Diagnosis not present

## 2020-08-10 DIAGNOSIS — Z8673 Personal history of transient ischemic attack (TIA), and cerebral infarction without residual deficits: Secondary | ICD-10-CM | POA: Diagnosis not present

## 2020-08-10 DIAGNOSIS — J9691 Respiratory failure, unspecified with hypoxia: Secondary | ICD-10-CM | POA: Diagnosis not present

## 2020-08-10 DIAGNOSIS — Z794 Long term (current) use of insulin: Secondary | ICD-10-CM | POA: Diagnosis not present

## 2020-08-10 DIAGNOSIS — J449 Chronic obstructive pulmonary disease, unspecified: Secondary | ICD-10-CM | POA: Diagnosis not present

## 2020-08-10 DIAGNOSIS — E1142 Type 2 diabetes mellitus with diabetic polyneuropathy: Secondary | ICD-10-CM | POA: Diagnosis not present

## 2020-08-12 DIAGNOSIS — E1142 Type 2 diabetes mellitus with diabetic polyneuropathy: Secondary | ICD-10-CM | POA: Diagnosis not present

## 2020-08-12 DIAGNOSIS — F0150 Vascular dementia without behavioral disturbance: Secondary | ICD-10-CM | POA: Diagnosis not present

## 2020-08-12 DIAGNOSIS — J302 Other seasonal allergic rhinitis: Secondary | ICD-10-CM | POA: Diagnosis not present

## 2020-08-12 DIAGNOSIS — G309 Alzheimer's disease, unspecified: Secondary | ICD-10-CM | POA: Diagnosis not present

## 2020-08-12 DIAGNOSIS — M810 Age-related osteoporosis without current pathological fracture: Secondary | ICD-10-CM | POA: Diagnosis not present

## 2020-08-12 DIAGNOSIS — Z794 Long term (current) use of insulin: Secondary | ICD-10-CM | POA: Diagnosis not present

## 2020-08-12 DIAGNOSIS — F0280 Dementia in other diseases classified elsewhere without behavioral disturbance: Secondary | ICD-10-CM | POA: Diagnosis not present

## 2020-08-12 DIAGNOSIS — L89152 Pressure ulcer of sacral region, stage 2: Secondary | ICD-10-CM | POA: Diagnosis not present

## 2020-08-12 DIAGNOSIS — Z9010 Acquired absence of unspecified breast and nipple: Secondary | ICD-10-CM | POA: Diagnosis not present

## 2020-08-12 DIAGNOSIS — Z8616 Personal history of COVID-19: Secondary | ICD-10-CM | POA: Diagnosis not present

## 2020-08-12 DIAGNOSIS — Z8673 Personal history of transient ischemic attack (TIA), and cerebral infarction without residual deficits: Secondary | ICD-10-CM | POA: Diagnosis not present

## 2020-08-12 DIAGNOSIS — R911 Solitary pulmonary nodule: Secondary | ICD-10-CM | POA: Diagnosis not present

## 2020-08-12 DIAGNOSIS — Z8679 Personal history of other diseases of the circulatory system: Secondary | ICD-10-CM | POA: Diagnosis not present

## 2020-08-12 DIAGNOSIS — E785 Hyperlipidemia, unspecified: Secondary | ICD-10-CM | POA: Diagnosis not present

## 2020-08-12 DIAGNOSIS — J449 Chronic obstructive pulmonary disease, unspecified: Secondary | ICD-10-CM | POA: Diagnosis not present

## 2020-08-12 DIAGNOSIS — Z853 Personal history of malignant neoplasm of breast: Secondary | ICD-10-CM | POA: Diagnosis not present

## 2020-08-12 DIAGNOSIS — Z8719 Personal history of other diseases of the digestive system: Secondary | ICD-10-CM | POA: Diagnosis not present

## 2020-08-12 DIAGNOSIS — I10 Essential (primary) hypertension: Secondary | ICD-10-CM | POA: Diagnosis not present

## 2020-08-12 DIAGNOSIS — Z8669 Personal history of other diseases of the nervous system and sense organs: Secondary | ICD-10-CM | POA: Diagnosis not present

## 2020-08-12 DIAGNOSIS — K219 Gastro-esophageal reflux disease without esophagitis: Secondary | ICD-10-CM | POA: Diagnosis not present

## 2020-08-12 DIAGNOSIS — G2581 Restless legs syndrome: Secondary | ICD-10-CM | POA: Diagnosis not present

## 2020-08-12 DIAGNOSIS — Z9981 Dependence on supplemental oxygen: Secondary | ICD-10-CM | POA: Diagnosis not present

## 2020-08-12 DIAGNOSIS — R339 Retention of urine, unspecified: Secondary | ICD-10-CM | POA: Diagnosis not present

## 2020-08-12 DIAGNOSIS — J9691 Respiratory failure, unspecified with hypoxia: Secondary | ICD-10-CM | POA: Diagnosis not present

## 2020-08-12 DIAGNOSIS — I25119 Atherosclerotic heart disease of native coronary artery with unspecified angina pectoris: Secondary | ICD-10-CM | POA: Diagnosis not present

## 2020-08-13 DIAGNOSIS — J9691 Respiratory failure, unspecified with hypoxia: Secondary | ICD-10-CM | POA: Diagnosis not present

## 2020-08-13 DIAGNOSIS — J449 Chronic obstructive pulmonary disease, unspecified: Secondary | ICD-10-CM | POA: Diagnosis not present

## 2020-08-13 DIAGNOSIS — Z794 Long term (current) use of insulin: Secondary | ICD-10-CM | POA: Diagnosis not present

## 2020-08-13 DIAGNOSIS — E1142 Type 2 diabetes mellitus with diabetic polyneuropathy: Secondary | ICD-10-CM | POA: Diagnosis not present

## 2020-08-13 DIAGNOSIS — Z8673 Personal history of transient ischemic attack (TIA), and cerebral infarction without residual deficits: Secondary | ICD-10-CM | POA: Diagnosis not present

## 2020-08-13 DIAGNOSIS — Z9981 Dependence on supplemental oxygen: Secondary | ICD-10-CM | POA: Diagnosis not present

## 2020-08-14 DIAGNOSIS — Z794 Long term (current) use of insulin: Secondary | ICD-10-CM | POA: Diagnosis not present

## 2020-08-14 DIAGNOSIS — Z8673 Personal history of transient ischemic attack (TIA), and cerebral infarction without residual deficits: Secondary | ICD-10-CM | POA: Diagnosis not present

## 2020-08-14 DIAGNOSIS — Z9981 Dependence on supplemental oxygen: Secondary | ICD-10-CM | POA: Diagnosis not present

## 2020-08-14 DIAGNOSIS — E1142 Type 2 diabetes mellitus with diabetic polyneuropathy: Secondary | ICD-10-CM | POA: Diagnosis not present

## 2020-08-14 DIAGNOSIS — J9691 Respiratory failure, unspecified with hypoxia: Secondary | ICD-10-CM | POA: Diagnosis not present

## 2020-08-14 DIAGNOSIS — J449 Chronic obstructive pulmonary disease, unspecified: Secondary | ICD-10-CM | POA: Diagnosis not present

## 2020-08-15 DIAGNOSIS — G8929 Other chronic pain: Secondary | ICD-10-CM | POA: Diagnosis not present

## 2020-08-15 DIAGNOSIS — E1142 Type 2 diabetes mellitus with diabetic polyneuropathy: Secondary | ICD-10-CM | POA: Diagnosis not present

## 2020-08-15 DIAGNOSIS — J449 Chronic obstructive pulmonary disease, unspecified: Secondary | ICD-10-CM | POA: Diagnosis not present

## 2020-08-15 DIAGNOSIS — J9691 Respiratory failure, unspecified with hypoxia: Secondary | ICD-10-CM | POA: Diagnosis not present

## 2020-08-15 DIAGNOSIS — Z794 Long term (current) use of insulin: Secondary | ICD-10-CM | POA: Diagnosis not present

## 2020-08-15 DIAGNOSIS — I10 Essential (primary) hypertension: Secondary | ICD-10-CM | POA: Diagnosis not present

## 2020-08-15 DIAGNOSIS — K5900 Constipation, unspecified: Secondary | ICD-10-CM | POA: Diagnosis not present

## 2020-08-15 DIAGNOSIS — J9610 Chronic respiratory failure, unspecified whether with hypoxia or hypercapnia: Secondary | ICD-10-CM | POA: Diagnosis not present

## 2020-08-15 DIAGNOSIS — I272 Pulmonary hypertension, unspecified: Secondary | ICD-10-CM | POA: Diagnosis not present

## 2020-08-15 DIAGNOSIS — E119 Type 2 diabetes mellitus without complications: Secondary | ICD-10-CM | POA: Diagnosis not present

## 2020-08-15 DIAGNOSIS — Z8673 Personal history of transient ischemic attack (TIA), and cerebral infarction without residual deficits: Secondary | ICD-10-CM | POA: Diagnosis not present

## 2020-08-15 DIAGNOSIS — Z9981 Dependence on supplemental oxygen: Secondary | ICD-10-CM | POA: Diagnosis not present

## 2020-08-15 DIAGNOSIS — K219 Gastro-esophageal reflux disease without esophagitis: Secondary | ICD-10-CM | POA: Diagnosis not present

## 2020-08-16 DIAGNOSIS — E1142 Type 2 diabetes mellitus with diabetic polyneuropathy: Secondary | ICD-10-CM | POA: Diagnosis not present

## 2020-08-16 DIAGNOSIS — J9691 Respiratory failure, unspecified with hypoxia: Secondary | ICD-10-CM | POA: Diagnosis not present

## 2020-08-16 DIAGNOSIS — Z9981 Dependence on supplemental oxygen: Secondary | ICD-10-CM | POA: Diagnosis not present

## 2020-08-16 DIAGNOSIS — Z8673 Personal history of transient ischemic attack (TIA), and cerebral infarction without residual deficits: Secondary | ICD-10-CM | POA: Diagnosis not present

## 2020-08-16 DIAGNOSIS — Z794 Long term (current) use of insulin: Secondary | ICD-10-CM | POA: Diagnosis not present

## 2020-08-16 DIAGNOSIS — J449 Chronic obstructive pulmonary disease, unspecified: Secondary | ICD-10-CM | POA: Diagnosis not present

## 2020-08-17 DIAGNOSIS — Z9981 Dependence on supplemental oxygen: Secondary | ICD-10-CM | POA: Diagnosis not present

## 2020-08-17 DIAGNOSIS — Z794 Long term (current) use of insulin: Secondary | ICD-10-CM | POA: Diagnosis not present

## 2020-08-17 DIAGNOSIS — Z8673 Personal history of transient ischemic attack (TIA), and cerebral infarction without residual deficits: Secondary | ICD-10-CM | POA: Diagnosis not present

## 2020-08-17 DIAGNOSIS — J9691 Respiratory failure, unspecified with hypoxia: Secondary | ICD-10-CM | POA: Diagnosis not present

## 2020-08-17 DIAGNOSIS — E1142 Type 2 diabetes mellitus with diabetic polyneuropathy: Secondary | ICD-10-CM | POA: Diagnosis not present

## 2020-08-17 DIAGNOSIS — J449 Chronic obstructive pulmonary disease, unspecified: Secondary | ICD-10-CM | POA: Diagnosis not present

## 2020-08-18 DIAGNOSIS — J9691 Respiratory failure, unspecified with hypoxia: Secondary | ICD-10-CM | POA: Diagnosis not present

## 2020-08-18 DIAGNOSIS — J449 Chronic obstructive pulmonary disease, unspecified: Secondary | ICD-10-CM | POA: Diagnosis not present

## 2020-08-18 DIAGNOSIS — Z9981 Dependence on supplemental oxygen: Secondary | ICD-10-CM | POA: Diagnosis not present

## 2020-08-18 DIAGNOSIS — E1142 Type 2 diabetes mellitus with diabetic polyneuropathy: Secondary | ICD-10-CM | POA: Diagnosis not present

## 2020-08-18 DIAGNOSIS — Z8673 Personal history of transient ischemic attack (TIA), and cerebral infarction without residual deficits: Secondary | ICD-10-CM | POA: Diagnosis not present

## 2020-08-18 DIAGNOSIS — Z794 Long term (current) use of insulin: Secondary | ICD-10-CM | POA: Diagnosis not present

## 2020-08-19 DIAGNOSIS — Z794 Long term (current) use of insulin: Secondary | ICD-10-CM | POA: Diagnosis not present

## 2020-08-19 DIAGNOSIS — J9691 Respiratory failure, unspecified with hypoxia: Secondary | ICD-10-CM | POA: Diagnosis not present

## 2020-08-19 DIAGNOSIS — Z9981 Dependence on supplemental oxygen: Secondary | ICD-10-CM | POA: Diagnosis not present

## 2020-08-19 DIAGNOSIS — J449 Chronic obstructive pulmonary disease, unspecified: Secondary | ICD-10-CM | POA: Diagnosis not present

## 2020-08-19 DIAGNOSIS — E1142 Type 2 diabetes mellitus with diabetic polyneuropathy: Secondary | ICD-10-CM | POA: Diagnosis not present

## 2020-08-19 DIAGNOSIS — Z8673 Personal history of transient ischemic attack (TIA), and cerebral infarction without residual deficits: Secondary | ICD-10-CM | POA: Diagnosis not present

## 2020-08-20 DIAGNOSIS — E139 Other specified diabetes mellitus without complications: Secondary | ICD-10-CM | POA: Diagnosis not present

## 2020-08-20 DIAGNOSIS — D649 Anemia, unspecified: Secondary | ICD-10-CM | POA: Diagnosis not present

## 2020-08-20 DIAGNOSIS — F0150 Vascular dementia without behavioral disturbance: Secondary | ICD-10-CM | POA: Diagnosis not present

## 2020-08-20 DIAGNOSIS — J449 Chronic obstructive pulmonary disease, unspecified: Secondary | ICD-10-CM | POA: Diagnosis not present

## 2020-08-20 DIAGNOSIS — Z9981 Dependence on supplemental oxygen: Secondary | ICD-10-CM | POA: Diagnosis not present

## 2020-08-20 DIAGNOSIS — E1140 Type 2 diabetes mellitus with diabetic neuropathy, unspecified: Secondary | ICD-10-CM | POA: Diagnosis not present

## 2020-08-20 DIAGNOSIS — M1990 Unspecified osteoarthritis, unspecified site: Secondary | ICD-10-CM | POA: Diagnosis not present

## 2020-08-20 DIAGNOSIS — E119 Type 2 diabetes mellitus without complications: Secondary | ICD-10-CM | POA: Diagnosis not present

## 2020-08-20 DIAGNOSIS — I10 Essential (primary) hypertension: Secondary | ICD-10-CM | POA: Diagnosis not present

## 2020-08-20 DIAGNOSIS — E785 Hyperlipidemia, unspecified: Secondary | ICD-10-CM | POA: Diagnosis not present

## 2020-08-20 DIAGNOSIS — Z794 Long term (current) use of insulin: Secondary | ICD-10-CM | POA: Diagnosis not present

## 2020-08-20 DIAGNOSIS — Z8673 Personal history of transient ischemic attack (TIA), and cerebral infarction without residual deficits: Secondary | ICD-10-CM | POA: Diagnosis not present

## 2020-08-20 DIAGNOSIS — J9691 Respiratory failure, unspecified with hypoxia: Secondary | ICD-10-CM | POA: Diagnosis not present

## 2020-08-20 DIAGNOSIS — M810 Age-related osteoporosis without current pathological fracture: Secondary | ICD-10-CM | POA: Diagnosis not present

## 2020-08-20 DIAGNOSIS — F0151 Vascular dementia with behavioral disturbance: Secondary | ICD-10-CM | POA: Diagnosis not present

## 2020-08-20 DIAGNOSIS — J441 Chronic obstructive pulmonary disease with (acute) exacerbation: Secondary | ICD-10-CM | POA: Diagnosis not present

## 2020-08-20 DIAGNOSIS — E1142 Type 2 diabetes mellitus with diabetic polyneuropathy: Secondary | ICD-10-CM | POA: Diagnosis not present

## 2020-08-22 DIAGNOSIS — Z8673 Personal history of transient ischemic attack (TIA), and cerebral infarction without residual deficits: Secondary | ICD-10-CM | POA: Diagnosis not present

## 2020-08-22 DIAGNOSIS — J449 Chronic obstructive pulmonary disease, unspecified: Secondary | ICD-10-CM | POA: Diagnosis not present

## 2020-08-22 DIAGNOSIS — E1142 Type 2 diabetes mellitus with diabetic polyneuropathy: Secondary | ICD-10-CM | POA: Diagnosis not present

## 2020-08-22 DIAGNOSIS — Z9981 Dependence on supplemental oxygen: Secondary | ICD-10-CM | POA: Diagnosis not present

## 2020-08-22 DIAGNOSIS — Z794 Long term (current) use of insulin: Secondary | ICD-10-CM | POA: Diagnosis not present

## 2020-08-22 DIAGNOSIS — J9691 Respiratory failure, unspecified with hypoxia: Secondary | ICD-10-CM | POA: Diagnosis not present

## 2020-08-24 DIAGNOSIS — J449 Chronic obstructive pulmonary disease, unspecified: Secondary | ICD-10-CM | POA: Diagnosis not present

## 2020-08-24 DIAGNOSIS — Z8673 Personal history of transient ischemic attack (TIA), and cerebral infarction without residual deficits: Secondary | ICD-10-CM | POA: Diagnosis not present

## 2020-08-24 DIAGNOSIS — Z9981 Dependence on supplemental oxygen: Secondary | ICD-10-CM | POA: Diagnosis not present

## 2020-08-24 DIAGNOSIS — Z794 Long term (current) use of insulin: Secondary | ICD-10-CM | POA: Diagnosis not present

## 2020-08-24 DIAGNOSIS — E1142 Type 2 diabetes mellitus with diabetic polyneuropathy: Secondary | ICD-10-CM | POA: Diagnosis not present

## 2020-08-24 DIAGNOSIS — J9691 Respiratory failure, unspecified with hypoxia: Secondary | ICD-10-CM | POA: Diagnosis not present

## 2020-08-27 DIAGNOSIS — Z8673 Personal history of transient ischemic attack (TIA), and cerebral infarction without residual deficits: Secondary | ICD-10-CM | POA: Diagnosis not present

## 2020-08-27 DIAGNOSIS — E1142 Type 2 diabetes mellitus with diabetic polyneuropathy: Secondary | ICD-10-CM | POA: Diagnosis not present

## 2020-08-27 DIAGNOSIS — Z9981 Dependence on supplemental oxygen: Secondary | ICD-10-CM | POA: Diagnosis not present

## 2020-08-27 DIAGNOSIS — Z794 Long term (current) use of insulin: Secondary | ICD-10-CM | POA: Diagnosis not present

## 2020-08-27 DIAGNOSIS — J9691 Respiratory failure, unspecified with hypoxia: Secondary | ICD-10-CM | POA: Diagnosis not present

## 2020-08-27 DIAGNOSIS — J449 Chronic obstructive pulmonary disease, unspecified: Secondary | ICD-10-CM | POA: Diagnosis not present

## 2020-08-30 DIAGNOSIS — M1990 Unspecified osteoarthritis, unspecified site: Secondary | ICD-10-CM | POA: Diagnosis not present

## 2020-08-30 DIAGNOSIS — K219 Gastro-esophageal reflux disease without esophagitis: Secondary | ICD-10-CM | POA: Diagnosis not present

## 2020-08-30 DIAGNOSIS — D649 Anemia, unspecified: Secondary | ICD-10-CM | POA: Diagnosis not present

## 2020-08-30 DIAGNOSIS — K5900 Constipation, unspecified: Secondary | ICD-10-CM | POA: Diagnosis not present

## 2020-08-30 DIAGNOSIS — I10 Essential (primary) hypertension: Secondary | ICD-10-CM | POA: Diagnosis not present

## 2020-08-30 DIAGNOSIS — F0151 Vascular dementia with behavioral disturbance: Secondary | ICD-10-CM | POA: Diagnosis not present

## 2020-08-30 DIAGNOSIS — E119 Type 2 diabetes mellitus without complications: Secondary | ICD-10-CM | POA: Diagnosis not present

## 2020-08-30 DIAGNOSIS — J9610 Chronic respiratory failure, unspecified whether with hypoxia or hypercapnia: Secondary | ICD-10-CM | POA: Diagnosis not present

## 2020-08-30 DIAGNOSIS — J449 Chronic obstructive pulmonary disease, unspecified: Secondary | ICD-10-CM | POA: Diagnosis not present

## 2020-08-30 DIAGNOSIS — G8929 Other chronic pain: Secondary | ICD-10-CM | POA: Diagnosis not present

## 2020-08-30 DIAGNOSIS — G629 Polyneuropathy, unspecified: Secondary | ICD-10-CM | POA: Diagnosis not present

## 2020-08-31 DIAGNOSIS — Z794 Long term (current) use of insulin: Secondary | ICD-10-CM | POA: Diagnosis not present

## 2020-08-31 DIAGNOSIS — Z8673 Personal history of transient ischemic attack (TIA), and cerebral infarction without residual deficits: Secondary | ICD-10-CM | POA: Diagnosis not present

## 2020-08-31 DIAGNOSIS — E1142 Type 2 diabetes mellitus with diabetic polyneuropathy: Secondary | ICD-10-CM | POA: Diagnosis not present

## 2020-08-31 DIAGNOSIS — J449 Chronic obstructive pulmonary disease, unspecified: Secondary | ICD-10-CM | POA: Diagnosis not present

## 2020-08-31 DIAGNOSIS — J9691 Respiratory failure, unspecified with hypoxia: Secondary | ICD-10-CM | POA: Diagnosis not present

## 2020-08-31 DIAGNOSIS — Z9981 Dependence on supplemental oxygen: Secondary | ICD-10-CM | POA: Diagnosis not present

## 2020-09-03 DIAGNOSIS — E1142 Type 2 diabetes mellitus with diabetic polyneuropathy: Secondary | ICD-10-CM | POA: Diagnosis not present

## 2020-09-03 DIAGNOSIS — Z794 Long term (current) use of insulin: Secondary | ICD-10-CM | POA: Diagnosis not present

## 2020-09-03 DIAGNOSIS — J9691 Respiratory failure, unspecified with hypoxia: Secondary | ICD-10-CM | POA: Diagnosis not present

## 2020-09-03 DIAGNOSIS — J449 Chronic obstructive pulmonary disease, unspecified: Secondary | ICD-10-CM | POA: Diagnosis not present

## 2020-09-03 DIAGNOSIS — Z9981 Dependence on supplemental oxygen: Secondary | ICD-10-CM | POA: Diagnosis not present

## 2020-09-03 DIAGNOSIS — Z8673 Personal history of transient ischemic attack (TIA), and cerebral infarction without residual deficits: Secondary | ICD-10-CM | POA: Diagnosis not present

## 2020-09-04 DIAGNOSIS — J449 Chronic obstructive pulmonary disease, unspecified: Secondary | ICD-10-CM | POA: Diagnosis not present

## 2020-09-04 DIAGNOSIS — J9691 Respiratory failure, unspecified with hypoxia: Secondary | ICD-10-CM | POA: Diagnosis not present

## 2020-09-04 DIAGNOSIS — Z9981 Dependence on supplemental oxygen: Secondary | ICD-10-CM | POA: Diagnosis not present

## 2020-09-04 DIAGNOSIS — E1142 Type 2 diabetes mellitus with diabetic polyneuropathy: Secondary | ICD-10-CM | POA: Diagnosis not present

## 2020-09-04 DIAGNOSIS — Z794 Long term (current) use of insulin: Secondary | ICD-10-CM | POA: Diagnosis not present

## 2020-09-04 DIAGNOSIS — Z8673 Personal history of transient ischemic attack (TIA), and cerebral infarction without residual deficits: Secondary | ICD-10-CM | POA: Diagnosis not present

## 2020-09-07 DIAGNOSIS — J9691 Respiratory failure, unspecified with hypoxia: Secondary | ICD-10-CM | POA: Diagnosis not present

## 2020-09-07 DIAGNOSIS — E1142 Type 2 diabetes mellitus with diabetic polyneuropathy: Secondary | ICD-10-CM | POA: Diagnosis not present

## 2020-09-07 DIAGNOSIS — Z9981 Dependence on supplemental oxygen: Secondary | ICD-10-CM | POA: Diagnosis not present

## 2020-09-07 DIAGNOSIS — J449 Chronic obstructive pulmonary disease, unspecified: Secondary | ICD-10-CM | POA: Diagnosis not present

## 2020-09-07 DIAGNOSIS — Z794 Long term (current) use of insulin: Secondary | ICD-10-CM | POA: Diagnosis not present

## 2020-09-07 DIAGNOSIS — Z8673 Personal history of transient ischemic attack (TIA), and cerebral infarction without residual deficits: Secondary | ICD-10-CM | POA: Diagnosis not present

## 2020-09-10 DIAGNOSIS — Z8673 Personal history of transient ischemic attack (TIA), and cerebral infarction without residual deficits: Secondary | ICD-10-CM | POA: Diagnosis not present

## 2020-09-10 DIAGNOSIS — Z9981 Dependence on supplemental oxygen: Secondary | ICD-10-CM | POA: Diagnosis not present

## 2020-09-10 DIAGNOSIS — E1142 Type 2 diabetes mellitus with diabetic polyneuropathy: Secondary | ICD-10-CM | POA: Diagnosis not present

## 2020-09-10 DIAGNOSIS — J9691 Respiratory failure, unspecified with hypoxia: Secondary | ICD-10-CM | POA: Diagnosis not present

## 2020-09-10 DIAGNOSIS — J449 Chronic obstructive pulmonary disease, unspecified: Secondary | ICD-10-CM | POA: Diagnosis not present

## 2020-09-10 DIAGNOSIS — Z794 Long term (current) use of insulin: Secondary | ICD-10-CM | POA: Diagnosis not present

## 2020-09-11 DIAGNOSIS — E1142 Type 2 diabetes mellitus with diabetic polyneuropathy: Secondary | ICD-10-CM | POA: Diagnosis not present

## 2020-09-11 DIAGNOSIS — J9691 Respiratory failure, unspecified with hypoxia: Secondary | ICD-10-CM | POA: Diagnosis not present

## 2020-09-11 DIAGNOSIS — Z9981 Dependence on supplemental oxygen: Secondary | ICD-10-CM | POA: Diagnosis not present

## 2020-09-11 DIAGNOSIS — J449 Chronic obstructive pulmonary disease, unspecified: Secondary | ICD-10-CM | POA: Diagnosis not present

## 2020-09-11 DIAGNOSIS — Z794 Long term (current) use of insulin: Secondary | ICD-10-CM | POA: Diagnosis not present

## 2020-09-11 DIAGNOSIS — Z8673 Personal history of transient ischemic attack (TIA), and cerebral infarction without residual deficits: Secondary | ICD-10-CM | POA: Diagnosis not present

## 2020-09-12 DIAGNOSIS — J302 Other seasonal allergic rhinitis: Secondary | ICD-10-CM | POA: Diagnosis not present

## 2020-09-12 DIAGNOSIS — J9610 Chronic respiratory failure, unspecified whether with hypoxia or hypercapnia: Secondary | ICD-10-CM | POA: Diagnosis not present

## 2020-09-12 DIAGNOSIS — F0150 Vascular dementia without behavioral disturbance: Secondary | ICD-10-CM | POA: Diagnosis not present

## 2020-09-12 DIAGNOSIS — M1990 Unspecified osteoarthritis, unspecified site: Secondary | ICD-10-CM | POA: Diagnosis not present

## 2020-09-12 DIAGNOSIS — G629 Polyneuropathy, unspecified: Secondary | ICD-10-CM | POA: Diagnosis not present

## 2020-09-12 DIAGNOSIS — Z8679 Personal history of other diseases of the circulatory system: Secondary | ICD-10-CM | POA: Diagnosis not present

## 2020-09-12 DIAGNOSIS — R32 Unspecified urinary incontinence: Secondary | ICD-10-CM | POA: Diagnosis not present

## 2020-09-12 DIAGNOSIS — R339 Retention of urine, unspecified: Secondary | ICD-10-CM | POA: Diagnosis not present

## 2020-09-12 DIAGNOSIS — Z853 Personal history of malignant neoplasm of breast: Secondary | ICD-10-CM | POA: Diagnosis not present

## 2020-09-12 DIAGNOSIS — J9691 Respiratory failure, unspecified with hypoxia: Secondary | ICD-10-CM | POA: Diagnosis not present

## 2020-09-12 DIAGNOSIS — R911 Solitary pulmonary nodule: Secondary | ICD-10-CM | POA: Diagnosis not present

## 2020-09-12 DIAGNOSIS — J449 Chronic obstructive pulmonary disease, unspecified: Secondary | ICD-10-CM | POA: Diagnosis not present

## 2020-09-12 DIAGNOSIS — E1142 Type 2 diabetes mellitus with diabetic polyneuropathy: Secondary | ICD-10-CM | POA: Diagnosis not present

## 2020-09-12 DIAGNOSIS — I10 Essential (primary) hypertension: Secondary | ICD-10-CM | POA: Diagnosis not present

## 2020-09-12 DIAGNOSIS — E119 Type 2 diabetes mellitus without complications: Secondary | ICD-10-CM | POA: Diagnosis not present

## 2020-09-12 DIAGNOSIS — Z794 Long term (current) use of insulin: Secondary | ICD-10-CM | POA: Diagnosis not present

## 2020-09-12 DIAGNOSIS — Z8673 Personal history of transient ischemic attack (TIA), and cerebral infarction without residual deficits: Secondary | ICD-10-CM | POA: Diagnosis not present

## 2020-09-12 DIAGNOSIS — L89152 Pressure ulcer of sacral region, stage 2: Secondary | ICD-10-CM | POA: Diagnosis not present

## 2020-09-12 DIAGNOSIS — Z8669 Personal history of other diseases of the nervous system and sense organs: Secondary | ICD-10-CM | POA: Diagnosis not present

## 2020-09-12 DIAGNOSIS — G2581 Restless legs syndrome: Secondary | ICD-10-CM | POA: Diagnosis not present

## 2020-09-12 DIAGNOSIS — I272 Pulmonary hypertension, unspecified: Secondary | ICD-10-CM | POA: Diagnosis not present

## 2020-09-12 DIAGNOSIS — E785 Hyperlipidemia, unspecified: Secondary | ICD-10-CM | POA: Diagnosis not present

## 2020-09-12 DIAGNOSIS — I25119 Atherosclerotic heart disease of native coronary artery with unspecified angina pectoris: Secondary | ICD-10-CM | POA: Diagnosis not present

## 2020-09-12 DIAGNOSIS — Z9981 Dependence on supplemental oxygen: Secondary | ICD-10-CM | POA: Diagnosis not present

## 2020-09-12 DIAGNOSIS — Z8719 Personal history of other diseases of the digestive system: Secondary | ICD-10-CM | POA: Diagnosis not present

## 2020-09-12 DIAGNOSIS — R159 Full incontinence of feces: Secondary | ICD-10-CM | POA: Diagnosis not present

## 2020-09-12 DIAGNOSIS — Z9010 Acquired absence of unspecified breast and nipple: Secondary | ICD-10-CM | POA: Diagnosis not present

## 2020-09-12 DIAGNOSIS — F0280 Dementia in other diseases classified elsewhere without behavioral disturbance: Secondary | ICD-10-CM | POA: Diagnosis not present

## 2020-09-12 DIAGNOSIS — G309 Alzheimer's disease, unspecified: Secondary | ICD-10-CM | POA: Diagnosis not present

## 2020-09-12 DIAGNOSIS — Z8616 Personal history of COVID-19: Secondary | ICD-10-CM | POA: Diagnosis not present

## 2020-09-14 DIAGNOSIS — Z9981 Dependence on supplemental oxygen: Secondary | ICD-10-CM | POA: Diagnosis not present

## 2020-09-14 DIAGNOSIS — J449 Chronic obstructive pulmonary disease, unspecified: Secondary | ICD-10-CM | POA: Diagnosis not present

## 2020-09-14 DIAGNOSIS — Z794 Long term (current) use of insulin: Secondary | ICD-10-CM | POA: Diagnosis not present

## 2020-09-14 DIAGNOSIS — E1142 Type 2 diabetes mellitus with diabetic polyneuropathy: Secondary | ICD-10-CM | POA: Diagnosis not present

## 2020-09-14 DIAGNOSIS — J9691 Respiratory failure, unspecified with hypoxia: Secondary | ICD-10-CM | POA: Diagnosis not present

## 2020-09-14 DIAGNOSIS — Z8673 Personal history of transient ischemic attack (TIA), and cerebral infarction without residual deficits: Secondary | ICD-10-CM | POA: Diagnosis not present

## 2020-09-17 DIAGNOSIS — Z8673 Personal history of transient ischemic attack (TIA), and cerebral infarction without residual deficits: Secondary | ICD-10-CM | POA: Diagnosis not present

## 2020-09-17 DIAGNOSIS — Z9981 Dependence on supplemental oxygen: Secondary | ICD-10-CM | POA: Diagnosis not present

## 2020-09-17 DIAGNOSIS — Z794 Long term (current) use of insulin: Secondary | ICD-10-CM | POA: Diagnosis not present

## 2020-09-17 DIAGNOSIS — E1142 Type 2 diabetes mellitus with diabetic polyneuropathy: Secondary | ICD-10-CM | POA: Diagnosis not present

## 2020-09-17 DIAGNOSIS — J449 Chronic obstructive pulmonary disease, unspecified: Secondary | ICD-10-CM | POA: Diagnosis not present

## 2020-09-17 DIAGNOSIS — J9691 Respiratory failure, unspecified with hypoxia: Secondary | ICD-10-CM | POA: Diagnosis not present

## 2020-09-18 DIAGNOSIS — Z794 Long term (current) use of insulin: Secondary | ICD-10-CM | POA: Diagnosis not present

## 2020-09-18 DIAGNOSIS — J449 Chronic obstructive pulmonary disease, unspecified: Secondary | ICD-10-CM | POA: Diagnosis not present

## 2020-09-18 DIAGNOSIS — Z8673 Personal history of transient ischemic attack (TIA), and cerebral infarction without residual deficits: Secondary | ICD-10-CM | POA: Diagnosis not present

## 2020-09-18 DIAGNOSIS — J9691 Respiratory failure, unspecified with hypoxia: Secondary | ICD-10-CM | POA: Diagnosis not present

## 2020-09-18 DIAGNOSIS — Z9981 Dependence on supplemental oxygen: Secondary | ICD-10-CM | POA: Diagnosis not present

## 2020-09-18 DIAGNOSIS — E1142 Type 2 diabetes mellitus with diabetic polyneuropathy: Secondary | ICD-10-CM | POA: Diagnosis not present

## 2020-09-21 DIAGNOSIS — Z794 Long term (current) use of insulin: Secondary | ICD-10-CM | POA: Diagnosis not present

## 2020-09-21 DIAGNOSIS — J9691 Respiratory failure, unspecified with hypoxia: Secondary | ICD-10-CM | POA: Diagnosis not present

## 2020-09-21 DIAGNOSIS — J449 Chronic obstructive pulmonary disease, unspecified: Secondary | ICD-10-CM | POA: Diagnosis not present

## 2020-09-21 DIAGNOSIS — Z8673 Personal history of transient ischemic attack (TIA), and cerebral infarction without residual deficits: Secondary | ICD-10-CM | POA: Diagnosis not present

## 2020-09-21 DIAGNOSIS — Z9981 Dependence on supplemental oxygen: Secondary | ICD-10-CM | POA: Diagnosis not present

## 2020-09-21 DIAGNOSIS — E1142 Type 2 diabetes mellitus with diabetic polyneuropathy: Secondary | ICD-10-CM | POA: Diagnosis not present

## 2020-09-24 DIAGNOSIS — Z9981 Dependence on supplemental oxygen: Secondary | ICD-10-CM | POA: Diagnosis not present

## 2020-09-24 DIAGNOSIS — J449 Chronic obstructive pulmonary disease, unspecified: Secondary | ICD-10-CM | POA: Diagnosis not present

## 2020-09-24 DIAGNOSIS — Z8673 Personal history of transient ischemic attack (TIA), and cerebral infarction without residual deficits: Secondary | ICD-10-CM | POA: Diagnosis not present

## 2020-09-24 DIAGNOSIS — Z794 Long term (current) use of insulin: Secondary | ICD-10-CM | POA: Diagnosis not present

## 2020-09-24 DIAGNOSIS — J9691 Respiratory failure, unspecified with hypoxia: Secondary | ICD-10-CM | POA: Diagnosis not present

## 2020-09-24 DIAGNOSIS — E1142 Type 2 diabetes mellitus with diabetic polyneuropathy: Secondary | ICD-10-CM | POA: Diagnosis not present

## 2020-09-25 DIAGNOSIS — J449 Chronic obstructive pulmonary disease, unspecified: Secondary | ICD-10-CM | POA: Diagnosis not present

## 2020-09-25 DIAGNOSIS — J9691 Respiratory failure, unspecified with hypoxia: Secondary | ICD-10-CM | POA: Diagnosis not present

## 2020-09-25 DIAGNOSIS — E1142 Type 2 diabetes mellitus with diabetic polyneuropathy: Secondary | ICD-10-CM | POA: Diagnosis not present

## 2020-09-25 DIAGNOSIS — Z794 Long term (current) use of insulin: Secondary | ICD-10-CM | POA: Diagnosis not present

## 2020-09-25 DIAGNOSIS — Z9981 Dependence on supplemental oxygen: Secondary | ICD-10-CM | POA: Diagnosis not present

## 2020-09-25 DIAGNOSIS — Z8673 Personal history of transient ischemic attack (TIA), and cerebral infarction without residual deficits: Secondary | ICD-10-CM | POA: Diagnosis not present

## 2020-09-28 DIAGNOSIS — J9691 Respiratory failure, unspecified with hypoxia: Secondary | ICD-10-CM | POA: Diagnosis not present

## 2020-09-28 DIAGNOSIS — E1142 Type 2 diabetes mellitus with diabetic polyneuropathy: Secondary | ICD-10-CM | POA: Diagnosis not present

## 2020-09-28 DIAGNOSIS — Z9981 Dependence on supplemental oxygen: Secondary | ICD-10-CM | POA: Diagnosis not present

## 2020-09-28 DIAGNOSIS — J449 Chronic obstructive pulmonary disease, unspecified: Secondary | ICD-10-CM | POA: Diagnosis not present

## 2020-09-28 DIAGNOSIS — Z8673 Personal history of transient ischemic attack (TIA), and cerebral infarction without residual deficits: Secondary | ICD-10-CM | POA: Diagnosis not present

## 2020-09-28 DIAGNOSIS — Z794 Long term (current) use of insulin: Secondary | ICD-10-CM | POA: Diagnosis not present

## 2020-09-30 DIAGNOSIS — Z794 Long term (current) use of insulin: Secondary | ICD-10-CM | POA: Diagnosis not present

## 2020-09-30 DIAGNOSIS — J9691 Respiratory failure, unspecified with hypoxia: Secondary | ICD-10-CM | POA: Diagnosis not present

## 2020-09-30 DIAGNOSIS — E1142 Type 2 diabetes mellitus with diabetic polyneuropathy: Secondary | ICD-10-CM | POA: Diagnosis not present

## 2020-09-30 DIAGNOSIS — Z8673 Personal history of transient ischemic attack (TIA), and cerebral infarction without residual deficits: Secondary | ICD-10-CM | POA: Diagnosis not present

## 2020-09-30 DIAGNOSIS — Z9981 Dependence on supplemental oxygen: Secondary | ICD-10-CM | POA: Diagnosis not present

## 2020-09-30 DIAGNOSIS — J449 Chronic obstructive pulmonary disease, unspecified: Secondary | ICD-10-CM | POA: Diagnosis not present

## 2020-10-02 DIAGNOSIS — E1142 Type 2 diabetes mellitus with diabetic polyneuropathy: Secondary | ICD-10-CM | POA: Diagnosis not present

## 2020-10-02 DIAGNOSIS — Z9981 Dependence on supplemental oxygen: Secondary | ICD-10-CM | POA: Diagnosis not present

## 2020-10-02 DIAGNOSIS — Z794 Long term (current) use of insulin: Secondary | ICD-10-CM | POA: Diagnosis not present

## 2020-10-02 DIAGNOSIS — J449 Chronic obstructive pulmonary disease, unspecified: Secondary | ICD-10-CM | POA: Diagnosis not present

## 2020-10-02 DIAGNOSIS — Z8673 Personal history of transient ischemic attack (TIA), and cerebral infarction without residual deficits: Secondary | ICD-10-CM | POA: Diagnosis not present

## 2020-10-02 DIAGNOSIS — J9691 Respiratory failure, unspecified with hypoxia: Secondary | ICD-10-CM | POA: Diagnosis not present

## 2020-10-04 DIAGNOSIS — J9610 Chronic respiratory failure, unspecified whether with hypoxia or hypercapnia: Secondary | ICD-10-CM | POA: Diagnosis not present

## 2020-10-04 DIAGNOSIS — G629 Polyneuropathy, unspecified: Secondary | ICD-10-CM | POA: Diagnosis not present

## 2020-10-04 DIAGNOSIS — E119 Type 2 diabetes mellitus without complications: Secondary | ICD-10-CM | POA: Diagnosis not present

## 2020-10-04 DIAGNOSIS — F0151 Vascular dementia with behavioral disturbance: Secondary | ICD-10-CM | POA: Diagnosis not present

## 2020-10-04 DIAGNOSIS — J449 Chronic obstructive pulmonary disease, unspecified: Secondary | ICD-10-CM | POA: Diagnosis not present

## 2020-10-04 DIAGNOSIS — D649 Anemia, unspecified: Secondary | ICD-10-CM | POA: Diagnosis not present

## 2020-10-05 DIAGNOSIS — Z9981 Dependence on supplemental oxygen: Secondary | ICD-10-CM | POA: Diagnosis not present

## 2020-10-05 DIAGNOSIS — Z8673 Personal history of transient ischemic attack (TIA), and cerebral infarction without residual deficits: Secondary | ICD-10-CM | POA: Diagnosis not present

## 2020-10-05 DIAGNOSIS — E1142 Type 2 diabetes mellitus with diabetic polyneuropathy: Secondary | ICD-10-CM | POA: Diagnosis not present

## 2020-10-05 DIAGNOSIS — Z794 Long term (current) use of insulin: Secondary | ICD-10-CM | POA: Diagnosis not present

## 2020-10-05 DIAGNOSIS — J9691 Respiratory failure, unspecified with hypoxia: Secondary | ICD-10-CM | POA: Diagnosis not present

## 2020-10-05 DIAGNOSIS — J449 Chronic obstructive pulmonary disease, unspecified: Secondary | ICD-10-CM | POA: Diagnosis not present

## 2020-10-06 DIAGNOSIS — Z794 Long term (current) use of insulin: Secondary | ICD-10-CM | POA: Diagnosis not present

## 2020-10-06 DIAGNOSIS — J449 Chronic obstructive pulmonary disease, unspecified: Secondary | ICD-10-CM | POA: Diagnosis not present

## 2020-10-06 DIAGNOSIS — Z8673 Personal history of transient ischemic attack (TIA), and cerebral infarction without residual deficits: Secondary | ICD-10-CM | POA: Diagnosis not present

## 2020-10-06 DIAGNOSIS — E1142 Type 2 diabetes mellitus with diabetic polyneuropathy: Secondary | ICD-10-CM | POA: Diagnosis not present

## 2020-10-06 DIAGNOSIS — J9691 Respiratory failure, unspecified with hypoxia: Secondary | ICD-10-CM | POA: Diagnosis not present

## 2020-10-06 DIAGNOSIS — Z9981 Dependence on supplemental oxygen: Secondary | ICD-10-CM | POA: Diagnosis not present

## 2020-10-08 DIAGNOSIS — Z9981 Dependence on supplemental oxygen: Secondary | ICD-10-CM | POA: Diagnosis not present

## 2020-10-08 DIAGNOSIS — J9691 Respiratory failure, unspecified with hypoxia: Secondary | ICD-10-CM | POA: Diagnosis not present

## 2020-10-08 DIAGNOSIS — J449 Chronic obstructive pulmonary disease, unspecified: Secondary | ICD-10-CM | POA: Diagnosis not present

## 2020-10-08 DIAGNOSIS — Z794 Long term (current) use of insulin: Secondary | ICD-10-CM | POA: Diagnosis not present

## 2020-10-08 DIAGNOSIS — Z8673 Personal history of transient ischemic attack (TIA), and cerebral infarction without residual deficits: Secondary | ICD-10-CM | POA: Diagnosis not present

## 2020-10-08 DIAGNOSIS — E1142 Type 2 diabetes mellitus with diabetic polyneuropathy: Secondary | ICD-10-CM | POA: Diagnosis not present

## 2020-10-10 DIAGNOSIS — Z9981 Dependence on supplemental oxygen: Secondary | ICD-10-CM | POA: Diagnosis not present

## 2020-10-10 DIAGNOSIS — G629 Polyneuropathy, unspecified: Secondary | ICD-10-CM | POA: Diagnosis not present

## 2020-10-10 DIAGNOSIS — I10 Essential (primary) hypertension: Secondary | ICD-10-CM | POA: Diagnosis not present

## 2020-10-10 DIAGNOSIS — J9691 Respiratory failure, unspecified with hypoxia: Secondary | ICD-10-CM | POA: Diagnosis not present

## 2020-10-10 DIAGNOSIS — J449 Chronic obstructive pulmonary disease, unspecified: Secondary | ICD-10-CM | POA: Diagnosis not present

## 2020-10-10 DIAGNOSIS — Z794 Long term (current) use of insulin: Secondary | ICD-10-CM | POA: Diagnosis not present

## 2020-10-10 DIAGNOSIS — F0151 Vascular dementia with behavioral disturbance: Secondary | ICD-10-CM | POA: Diagnosis not present

## 2020-10-10 DIAGNOSIS — E119 Type 2 diabetes mellitus without complications: Secondary | ICD-10-CM | POA: Diagnosis not present

## 2020-10-10 DIAGNOSIS — E1142 Type 2 diabetes mellitus with diabetic polyneuropathy: Secondary | ICD-10-CM | POA: Diagnosis not present

## 2020-10-10 DIAGNOSIS — Z8673 Personal history of transient ischemic attack (TIA), and cerebral infarction without residual deficits: Secondary | ICD-10-CM | POA: Diagnosis not present

## 2020-10-10 DIAGNOSIS — I272 Pulmonary hypertension, unspecified: Secondary | ICD-10-CM | POA: Diagnosis not present

## 2020-10-10 DIAGNOSIS — D649 Anemia, unspecified: Secondary | ICD-10-CM | POA: Diagnosis not present

## 2020-10-12 DIAGNOSIS — R911 Solitary pulmonary nodule: Secondary | ICD-10-CM | POA: Diagnosis not present

## 2020-10-12 DIAGNOSIS — Z8673 Personal history of transient ischemic attack (TIA), and cerebral infarction without residual deficits: Secondary | ICD-10-CM | POA: Diagnosis not present

## 2020-10-12 DIAGNOSIS — G309 Alzheimer's disease, unspecified: Secondary | ICD-10-CM | POA: Diagnosis not present

## 2020-10-12 DIAGNOSIS — I25119 Atherosclerotic heart disease of native coronary artery with unspecified angina pectoris: Secondary | ICD-10-CM | POA: Diagnosis not present

## 2020-10-12 DIAGNOSIS — Z8679 Personal history of other diseases of the circulatory system: Secondary | ICD-10-CM | POA: Diagnosis not present

## 2020-10-12 DIAGNOSIS — J449 Chronic obstructive pulmonary disease, unspecified: Secondary | ICD-10-CM | POA: Diagnosis not present

## 2020-10-12 DIAGNOSIS — F0150 Vascular dementia without behavioral disturbance: Secondary | ICD-10-CM | POA: Diagnosis not present

## 2020-10-12 DIAGNOSIS — Z8669 Personal history of other diseases of the nervous system and sense organs: Secondary | ICD-10-CM | POA: Diagnosis not present

## 2020-10-12 DIAGNOSIS — Z8719 Personal history of other diseases of the digestive system: Secondary | ICD-10-CM | POA: Diagnosis not present

## 2020-10-12 DIAGNOSIS — F0280 Dementia in other diseases classified elsewhere without behavioral disturbance: Secondary | ICD-10-CM | POA: Diagnosis not present

## 2020-10-12 DIAGNOSIS — Z9010 Acquired absence of unspecified breast and nipple: Secondary | ICD-10-CM | POA: Diagnosis not present

## 2020-10-12 DIAGNOSIS — R159 Full incontinence of feces: Secondary | ICD-10-CM | POA: Diagnosis not present

## 2020-10-12 DIAGNOSIS — Z8616 Personal history of COVID-19: Secondary | ICD-10-CM | POA: Diagnosis not present

## 2020-10-12 DIAGNOSIS — R32 Unspecified urinary incontinence: Secondary | ICD-10-CM | POA: Diagnosis not present

## 2020-10-12 DIAGNOSIS — G2581 Restless legs syndrome: Secondary | ICD-10-CM | POA: Diagnosis not present

## 2020-10-12 DIAGNOSIS — E1142 Type 2 diabetes mellitus with diabetic polyneuropathy: Secondary | ICD-10-CM | POA: Diagnosis not present

## 2020-10-12 DIAGNOSIS — Z853 Personal history of malignant neoplasm of breast: Secondary | ICD-10-CM | POA: Diagnosis not present

## 2020-10-12 DIAGNOSIS — I10 Essential (primary) hypertension: Secondary | ICD-10-CM | POA: Diagnosis not present

## 2020-10-12 DIAGNOSIS — Z9981 Dependence on supplemental oxygen: Secondary | ICD-10-CM | POA: Diagnosis not present

## 2020-10-12 DIAGNOSIS — Z794 Long term (current) use of insulin: Secondary | ICD-10-CM | POA: Diagnosis not present

## 2020-10-12 DIAGNOSIS — J302 Other seasonal allergic rhinitis: Secondary | ICD-10-CM | POA: Diagnosis not present

## 2020-10-12 DIAGNOSIS — R339 Retention of urine, unspecified: Secondary | ICD-10-CM | POA: Diagnosis not present

## 2020-10-12 DIAGNOSIS — J9691 Respiratory failure, unspecified with hypoxia: Secondary | ICD-10-CM | POA: Diagnosis not present

## 2020-10-12 DIAGNOSIS — L89152 Pressure ulcer of sacral region, stage 2: Secondary | ICD-10-CM | POA: Diagnosis not present

## 2020-10-12 DIAGNOSIS — E785 Hyperlipidemia, unspecified: Secondary | ICD-10-CM | POA: Diagnosis not present

## 2020-10-15 DIAGNOSIS — J449 Chronic obstructive pulmonary disease, unspecified: Secondary | ICD-10-CM | POA: Diagnosis not present

## 2020-10-15 DIAGNOSIS — Z794 Long term (current) use of insulin: Secondary | ICD-10-CM | POA: Diagnosis not present

## 2020-10-15 DIAGNOSIS — E1142 Type 2 diabetes mellitus with diabetic polyneuropathy: Secondary | ICD-10-CM | POA: Diagnosis not present

## 2020-10-15 DIAGNOSIS — Z8673 Personal history of transient ischemic attack (TIA), and cerebral infarction without residual deficits: Secondary | ICD-10-CM | POA: Diagnosis not present

## 2020-10-15 DIAGNOSIS — Z9981 Dependence on supplemental oxygen: Secondary | ICD-10-CM | POA: Diagnosis not present

## 2020-10-15 DIAGNOSIS — J9691 Respiratory failure, unspecified with hypoxia: Secondary | ICD-10-CM | POA: Diagnosis not present

## 2020-10-16 DIAGNOSIS — J449 Chronic obstructive pulmonary disease, unspecified: Secondary | ICD-10-CM | POA: Diagnosis not present

## 2020-10-16 DIAGNOSIS — Z9981 Dependence on supplemental oxygen: Secondary | ICD-10-CM | POA: Diagnosis not present

## 2020-10-16 DIAGNOSIS — Z794 Long term (current) use of insulin: Secondary | ICD-10-CM | POA: Diagnosis not present

## 2020-10-16 DIAGNOSIS — Z8673 Personal history of transient ischemic attack (TIA), and cerebral infarction without residual deficits: Secondary | ICD-10-CM | POA: Diagnosis not present

## 2020-10-16 DIAGNOSIS — E1142 Type 2 diabetes mellitus with diabetic polyneuropathy: Secondary | ICD-10-CM | POA: Diagnosis not present

## 2020-10-16 DIAGNOSIS — J9691 Respiratory failure, unspecified with hypoxia: Secondary | ICD-10-CM | POA: Diagnosis not present

## 2020-10-19 DIAGNOSIS — J9691 Respiratory failure, unspecified with hypoxia: Secondary | ICD-10-CM | POA: Diagnosis not present

## 2020-10-19 DIAGNOSIS — Z9981 Dependence on supplemental oxygen: Secondary | ICD-10-CM | POA: Diagnosis not present

## 2020-10-19 DIAGNOSIS — Z8673 Personal history of transient ischemic attack (TIA), and cerebral infarction without residual deficits: Secondary | ICD-10-CM | POA: Diagnosis not present

## 2020-10-19 DIAGNOSIS — J449 Chronic obstructive pulmonary disease, unspecified: Secondary | ICD-10-CM | POA: Diagnosis not present

## 2020-10-19 DIAGNOSIS — E1142 Type 2 diabetes mellitus with diabetic polyneuropathy: Secondary | ICD-10-CM | POA: Diagnosis not present

## 2020-10-19 DIAGNOSIS — Z794 Long term (current) use of insulin: Secondary | ICD-10-CM | POA: Diagnosis not present

## 2020-10-20 DIAGNOSIS — E1142 Type 2 diabetes mellitus with diabetic polyneuropathy: Secondary | ICD-10-CM | POA: Diagnosis not present

## 2020-10-20 DIAGNOSIS — Z794 Long term (current) use of insulin: Secondary | ICD-10-CM | POA: Diagnosis not present

## 2020-10-20 DIAGNOSIS — Z8673 Personal history of transient ischemic attack (TIA), and cerebral infarction without residual deficits: Secondary | ICD-10-CM | POA: Diagnosis not present

## 2020-10-20 DIAGNOSIS — J449 Chronic obstructive pulmonary disease, unspecified: Secondary | ICD-10-CM | POA: Diagnosis not present

## 2020-10-20 DIAGNOSIS — J9691 Respiratory failure, unspecified with hypoxia: Secondary | ICD-10-CM | POA: Diagnosis not present

## 2020-10-20 DIAGNOSIS — Z9981 Dependence on supplemental oxygen: Secondary | ICD-10-CM | POA: Diagnosis not present

## 2020-10-21 DIAGNOSIS — E1142 Type 2 diabetes mellitus with diabetic polyneuropathy: Secondary | ICD-10-CM | POA: Diagnosis not present

## 2020-10-21 DIAGNOSIS — Z9981 Dependence on supplemental oxygen: Secondary | ICD-10-CM | POA: Diagnosis not present

## 2020-10-21 DIAGNOSIS — J449 Chronic obstructive pulmonary disease, unspecified: Secondary | ICD-10-CM | POA: Diagnosis not present

## 2020-10-21 DIAGNOSIS — J9691 Respiratory failure, unspecified with hypoxia: Secondary | ICD-10-CM | POA: Diagnosis not present

## 2020-10-21 DIAGNOSIS — Z8673 Personal history of transient ischemic attack (TIA), and cerebral infarction without residual deficits: Secondary | ICD-10-CM | POA: Diagnosis not present

## 2020-10-21 DIAGNOSIS — Z794 Long term (current) use of insulin: Secondary | ICD-10-CM | POA: Diagnosis not present

## 2020-10-22 DIAGNOSIS — J449 Chronic obstructive pulmonary disease, unspecified: Secondary | ICD-10-CM | POA: Diagnosis not present

## 2020-10-22 DIAGNOSIS — Z8673 Personal history of transient ischemic attack (TIA), and cerebral infarction without residual deficits: Secondary | ICD-10-CM | POA: Diagnosis not present

## 2020-10-22 DIAGNOSIS — Z794 Long term (current) use of insulin: Secondary | ICD-10-CM | POA: Diagnosis not present

## 2020-10-22 DIAGNOSIS — E1142 Type 2 diabetes mellitus with diabetic polyneuropathy: Secondary | ICD-10-CM | POA: Diagnosis not present

## 2020-10-22 DIAGNOSIS — Z9981 Dependence on supplemental oxygen: Secondary | ICD-10-CM | POA: Diagnosis not present

## 2020-10-22 DIAGNOSIS — J9691 Respiratory failure, unspecified with hypoxia: Secondary | ICD-10-CM | POA: Diagnosis not present

## 2020-10-23 DIAGNOSIS — Z9981 Dependence on supplemental oxygen: Secondary | ICD-10-CM | POA: Diagnosis not present

## 2020-10-23 DIAGNOSIS — J449 Chronic obstructive pulmonary disease, unspecified: Secondary | ICD-10-CM | POA: Diagnosis not present

## 2020-10-23 DIAGNOSIS — Z794 Long term (current) use of insulin: Secondary | ICD-10-CM | POA: Diagnosis not present

## 2020-10-23 DIAGNOSIS — Z8673 Personal history of transient ischemic attack (TIA), and cerebral infarction without residual deficits: Secondary | ICD-10-CM | POA: Diagnosis not present

## 2020-10-23 DIAGNOSIS — J9691 Respiratory failure, unspecified with hypoxia: Secondary | ICD-10-CM | POA: Diagnosis not present

## 2020-10-23 DIAGNOSIS — E1142 Type 2 diabetes mellitus with diabetic polyneuropathy: Secondary | ICD-10-CM | POA: Diagnosis not present

## 2020-10-24 DIAGNOSIS — Z9981 Dependence on supplemental oxygen: Secondary | ICD-10-CM | POA: Diagnosis not present

## 2020-10-24 DIAGNOSIS — E1142 Type 2 diabetes mellitus with diabetic polyneuropathy: Secondary | ICD-10-CM | POA: Diagnosis not present

## 2020-10-24 DIAGNOSIS — J449 Chronic obstructive pulmonary disease, unspecified: Secondary | ICD-10-CM | POA: Diagnosis not present

## 2020-10-24 DIAGNOSIS — Z794 Long term (current) use of insulin: Secondary | ICD-10-CM | POA: Diagnosis not present

## 2020-10-24 DIAGNOSIS — Z8673 Personal history of transient ischemic attack (TIA), and cerebral infarction without residual deficits: Secondary | ICD-10-CM | POA: Diagnosis not present

## 2020-10-24 DIAGNOSIS — J9691 Respiratory failure, unspecified with hypoxia: Secondary | ICD-10-CM | POA: Diagnosis not present

## 2020-10-25 DIAGNOSIS — Z8673 Personal history of transient ischemic attack (TIA), and cerebral infarction without residual deficits: Secondary | ICD-10-CM | POA: Diagnosis not present

## 2020-10-25 DIAGNOSIS — Z9981 Dependence on supplemental oxygen: Secondary | ICD-10-CM | POA: Diagnosis not present

## 2020-10-25 DIAGNOSIS — Z794 Long term (current) use of insulin: Secondary | ICD-10-CM | POA: Diagnosis not present

## 2020-10-25 DIAGNOSIS — J449 Chronic obstructive pulmonary disease, unspecified: Secondary | ICD-10-CM | POA: Diagnosis not present

## 2020-10-25 DIAGNOSIS — E1142 Type 2 diabetes mellitus with diabetic polyneuropathy: Secondary | ICD-10-CM | POA: Diagnosis not present

## 2020-10-25 DIAGNOSIS — J9691 Respiratory failure, unspecified with hypoxia: Secondary | ICD-10-CM | POA: Diagnosis not present

## 2020-10-26 DIAGNOSIS — Z9981 Dependence on supplemental oxygen: Secondary | ICD-10-CM | POA: Diagnosis not present

## 2020-10-26 DIAGNOSIS — Z794 Long term (current) use of insulin: Secondary | ICD-10-CM | POA: Diagnosis not present

## 2020-10-26 DIAGNOSIS — J9691 Respiratory failure, unspecified with hypoxia: Secondary | ICD-10-CM | POA: Diagnosis not present

## 2020-10-26 DIAGNOSIS — E1142 Type 2 diabetes mellitus with diabetic polyneuropathy: Secondary | ICD-10-CM | POA: Diagnosis not present

## 2020-10-26 DIAGNOSIS — J449 Chronic obstructive pulmonary disease, unspecified: Secondary | ICD-10-CM | POA: Diagnosis not present

## 2020-10-26 DIAGNOSIS — Z8673 Personal history of transient ischemic attack (TIA), and cerebral infarction without residual deficits: Secondary | ICD-10-CM | POA: Diagnosis not present

## 2020-10-27 DIAGNOSIS — Z8673 Personal history of transient ischemic attack (TIA), and cerebral infarction without residual deficits: Secondary | ICD-10-CM | POA: Diagnosis not present

## 2020-10-27 DIAGNOSIS — J449 Chronic obstructive pulmonary disease, unspecified: Secondary | ICD-10-CM | POA: Diagnosis not present

## 2020-10-27 DIAGNOSIS — Z9981 Dependence on supplemental oxygen: Secondary | ICD-10-CM | POA: Diagnosis not present

## 2020-10-27 DIAGNOSIS — E1142 Type 2 diabetes mellitus with diabetic polyneuropathy: Secondary | ICD-10-CM | POA: Diagnosis not present

## 2020-10-27 DIAGNOSIS — J9691 Respiratory failure, unspecified with hypoxia: Secondary | ICD-10-CM | POA: Diagnosis not present

## 2020-10-27 DIAGNOSIS — Z794 Long term (current) use of insulin: Secondary | ICD-10-CM | POA: Diagnosis not present

## 2020-10-28 DIAGNOSIS — Z8673 Personal history of transient ischemic attack (TIA), and cerebral infarction without residual deficits: Secondary | ICD-10-CM | POA: Diagnosis not present

## 2020-10-28 DIAGNOSIS — E1142 Type 2 diabetes mellitus with diabetic polyneuropathy: Secondary | ICD-10-CM | POA: Diagnosis not present

## 2020-10-28 DIAGNOSIS — Z9981 Dependence on supplemental oxygen: Secondary | ICD-10-CM | POA: Diagnosis not present

## 2020-10-28 DIAGNOSIS — J9691 Respiratory failure, unspecified with hypoxia: Secondary | ICD-10-CM | POA: Diagnosis not present

## 2020-10-28 DIAGNOSIS — J449 Chronic obstructive pulmonary disease, unspecified: Secondary | ICD-10-CM | POA: Diagnosis not present

## 2020-10-28 DIAGNOSIS — Z794 Long term (current) use of insulin: Secondary | ICD-10-CM | POA: Diagnosis not present

## 2020-10-29 DIAGNOSIS — E1142 Type 2 diabetes mellitus with diabetic polyneuropathy: Secondary | ICD-10-CM | POA: Diagnosis not present

## 2020-10-29 DIAGNOSIS — Z794 Long term (current) use of insulin: Secondary | ICD-10-CM | POA: Diagnosis not present

## 2020-10-29 DIAGNOSIS — Z8673 Personal history of transient ischemic attack (TIA), and cerebral infarction without residual deficits: Secondary | ICD-10-CM | POA: Diagnosis not present

## 2020-10-29 DIAGNOSIS — J449 Chronic obstructive pulmonary disease, unspecified: Secondary | ICD-10-CM | POA: Diagnosis not present

## 2020-10-29 DIAGNOSIS — J9691 Respiratory failure, unspecified with hypoxia: Secondary | ICD-10-CM | POA: Diagnosis not present

## 2020-10-29 DIAGNOSIS — Z9981 Dependence on supplemental oxygen: Secondary | ICD-10-CM | POA: Diagnosis not present

## 2020-10-30 DIAGNOSIS — E1142 Type 2 diabetes mellitus with diabetic polyneuropathy: Secondary | ICD-10-CM | POA: Diagnosis not present

## 2020-10-30 DIAGNOSIS — J449 Chronic obstructive pulmonary disease, unspecified: Secondary | ICD-10-CM | POA: Diagnosis not present

## 2020-10-30 DIAGNOSIS — Z9981 Dependence on supplemental oxygen: Secondary | ICD-10-CM | POA: Diagnosis not present

## 2020-10-30 DIAGNOSIS — J9691 Respiratory failure, unspecified with hypoxia: Secondary | ICD-10-CM | POA: Diagnosis not present

## 2020-10-30 DIAGNOSIS — Z8673 Personal history of transient ischemic attack (TIA), and cerebral infarction without residual deficits: Secondary | ICD-10-CM | POA: Diagnosis not present

## 2020-10-30 DIAGNOSIS — Z794 Long term (current) use of insulin: Secondary | ICD-10-CM | POA: Diagnosis not present

## 2020-10-31 DIAGNOSIS — J9691 Respiratory failure, unspecified with hypoxia: Secondary | ICD-10-CM | POA: Diagnosis not present

## 2020-10-31 DIAGNOSIS — Z794 Long term (current) use of insulin: Secondary | ICD-10-CM | POA: Diagnosis not present

## 2020-10-31 DIAGNOSIS — E1142 Type 2 diabetes mellitus with diabetic polyneuropathy: Secondary | ICD-10-CM | POA: Diagnosis not present

## 2020-10-31 DIAGNOSIS — J449 Chronic obstructive pulmonary disease, unspecified: Secondary | ICD-10-CM | POA: Diagnosis not present

## 2020-10-31 DIAGNOSIS — Z8673 Personal history of transient ischemic attack (TIA), and cerebral infarction without residual deficits: Secondary | ICD-10-CM | POA: Diagnosis not present

## 2020-10-31 DIAGNOSIS — Z9981 Dependence on supplemental oxygen: Secondary | ICD-10-CM | POA: Diagnosis not present

## 2020-11-01 DIAGNOSIS — G629 Polyneuropathy, unspecified: Secondary | ICD-10-CM | POA: Diagnosis not present

## 2020-11-01 DIAGNOSIS — D649 Anemia, unspecified: Secondary | ICD-10-CM | POA: Diagnosis not present

## 2020-11-01 DIAGNOSIS — J449 Chronic obstructive pulmonary disease, unspecified: Secondary | ICD-10-CM | POA: Diagnosis not present

## 2020-11-01 DIAGNOSIS — Z794 Long term (current) use of insulin: Secondary | ICD-10-CM | POA: Diagnosis not present

## 2020-11-01 DIAGNOSIS — J9691 Respiratory failure, unspecified with hypoxia: Secondary | ICD-10-CM | POA: Diagnosis not present

## 2020-11-01 DIAGNOSIS — E119 Type 2 diabetes mellitus without complications: Secondary | ICD-10-CM | POA: Diagnosis not present

## 2020-11-01 DIAGNOSIS — Z9981 Dependence on supplemental oxygen: Secondary | ICD-10-CM | POA: Diagnosis not present

## 2020-11-01 DIAGNOSIS — J9610 Chronic respiratory failure, unspecified whether with hypoxia or hypercapnia: Secondary | ICD-10-CM | POA: Diagnosis not present

## 2020-11-01 DIAGNOSIS — E1142 Type 2 diabetes mellitus with diabetic polyneuropathy: Secondary | ICD-10-CM | POA: Diagnosis not present

## 2020-11-01 DIAGNOSIS — Z8673 Personal history of transient ischemic attack (TIA), and cerebral infarction without residual deficits: Secondary | ICD-10-CM | POA: Diagnosis not present

## 2020-11-02 DIAGNOSIS — Z794 Long term (current) use of insulin: Secondary | ICD-10-CM | POA: Diagnosis not present

## 2020-11-02 DIAGNOSIS — E1142 Type 2 diabetes mellitus with diabetic polyneuropathy: Secondary | ICD-10-CM | POA: Diagnosis not present

## 2020-11-02 DIAGNOSIS — Z9981 Dependence on supplemental oxygen: Secondary | ICD-10-CM | POA: Diagnosis not present

## 2020-11-02 DIAGNOSIS — J9691 Respiratory failure, unspecified with hypoxia: Secondary | ICD-10-CM | POA: Diagnosis not present

## 2020-11-02 DIAGNOSIS — J449 Chronic obstructive pulmonary disease, unspecified: Secondary | ICD-10-CM | POA: Diagnosis not present

## 2020-11-02 DIAGNOSIS — Z8673 Personal history of transient ischemic attack (TIA), and cerebral infarction without residual deficits: Secondary | ICD-10-CM | POA: Diagnosis not present

## 2020-11-03 DIAGNOSIS — E1142 Type 2 diabetes mellitus with diabetic polyneuropathy: Secondary | ICD-10-CM | POA: Diagnosis not present

## 2020-11-03 DIAGNOSIS — J9691 Respiratory failure, unspecified with hypoxia: Secondary | ICD-10-CM | POA: Diagnosis not present

## 2020-11-03 DIAGNOSIS — Z794 Long term (current) use of insulin: Secondary | ICD-10-CM | POA: Diagnosis not present

## 2020-11-03 DIAGNOSIS — J449 Chronic obstructive pulmonary disease, unspecified: Secondary | ICD-10-CM | POA: Diagnosis not present

## 2020-11-03 DIAGNOSIS — Z9981 Dependence on supplemental oxygen: Secondary | ICD-10-CM | POA: Diagnosis not present

## 2020-11-03 DIAGNOSIS — Z8673 Personal history of transient ischemic attack (TIA), and cerebral infarction without residual deficits: Secondary | ICD-10-CM | POA: Diagnosis not present

## 2020-11-04 DIAGNOSIS — Z9981 Dependence on supplemental oxygen: Secondary | ICD-10-CM | POA: Diagnosis not present

## 2020-11-04 DIAGNOSIS — Z794 Long term (current) use of insulin: Secondary | ICD-10-CM | POA: Diagnosis not present

## 2020-11-04 DIAGNOSIS — E1142 Type 2 diabetes mellitus with diabetic polyneuropathy: Secondary | ICD-10-CM | POA: Diagnosis not present

## 2020-11-04 DIAGNOSIS — J449 Chronic obstructive pulmonary disease, unspecified: Secondary | ICD-10-CM | POA: Diagnosis not present

## 2020-11-04 DIAGNOSIS — Z8673 Personal history of transient ischemic attack (TIA), and cerebral infarction without residual deficits: Secondary | ICD-10-CM | POA: Diagnosis not present

## 2020-11-04 DIAGNOSIS — J9691 Respiratory failure, unspecified with hypoxia: Secondary | ICD-10-CM | POA: Diagnosis not present

## 2020-11-05 DIAGNOSIS — J9691 Respiratory failure, unspecified with hypoxia: Secondary | ICD-10-CM | POA: Diagnosis not present

## 2020-11-05 DIAGNOSIS — Z794 Long term (current) use of insulin: Secondary | ICD-10-CM | POA: Diagnosis not present

## 2020-11-05 DIAGNOSIS — Z9981 Dependence on supplemental oxygen: Secondary | ICD-10-CM | POA: Diagnosis not present

## 2020-11-05 DIAGNOSIS — Z8673 Personal history of transient ischemic attack (TIA), and cerebral infarction without residual deficits: Secondary | ICD-10-CM | POA: Diagnosis not present

## 2020-11-05 DIAGNOSIS — E1142 Type 2 diabetes mellitus with diabetic polyneuropathy: Secondary | ICD-10-CM | POA: Diagnosis not present

## 2020-11-05 DIAGNOSIS — J449 Chronic obstructive pulmonary disease, unspecified: Secondary | ICD-10-CM | POA: Diagnosis not present

## 2020-11-06 DIAGNOSIS — Z794 Long term (current) use of insulin: Secondary | ICD-10-CM | POA: Diagnosis not present

## 2020-11-06 DIAGNOSIS — Z9981 Dependence on supplemental oxygen: Secondary | ICD-10-CM | POA: Diagnosis not present

## 2020-11-06 DIAGNOSIS — Z8673 Personal history of transient ischemic attack (TIA), and cerebral infarction without residual deficits: Secondary | ICD-10-CM | POA: Diagnosis not present

## 2020-11-06 DIAGNOSIS — E1142 Type 2 diabetes mellitus with diabetic polyneuropathy: Secondary | ICD-10-CM | POA: Diagnosis not present

## 2020-11-06 DIAGNOSIS — J449 Chronic obstructive pulmonary disease, unspecified: Secondary | ICD-10-CM | POA: Diagnosis not present

## 2020-11-06 DIAGNOSIS — J9691 Respiratory failure, unspecified with hypoxia: Secondary | ICD-10-CM | POA: Diagnosis not present

## 2020-11-12 DIAGNOSIS — 419620001 Death: Secondary | SNOMED CT | POA: Diagnosis not present

## 2020-11-12 DEATH — deceased
